# Patient Record
Sex: Female | Born: 1940
Health system: Southern US, Community
[De-identification: ages and names within clinical notes are randomized; demographics above are authoritative.]

## PROBLEM LIST (undated history)

## (undated) DIAGNOSIS — I10 Essential (primary) hypertension: Secondary | ICD-10-CM

## (undated) DIAGNOSIS — S72001A Fracture of unspecified part of neck of right femur, initial encounter for closed fracture: Secondary | ICD-10-CM

## (undated) DIAGNOSIS — M1990 Unspecified osteoarthritis, unspecified site: Secondary | ICD-10-CM

## (undated) DIAGNOSIS — D45 Polycythemia vera: Secondary | ICD-10-CM

## (undated) DIAGNOSIS — D473 Essential (hemorrhagic) thrombocythemia: Secondary | ICD-10-CM

## (undated) DIAGNOSIS — M6282 Rhabdomyolysis: Secondary | ICD-10-CM

## (undated) DIAGNOSIS — H409 Unspecified glaucoma: Secondary | ICD-10-CM

## (undated) DIAGNOSIS — L03119 Cellulitis of unspecified part of limb: Secondary | ICD-10-CM

## (undated) DIAGNOSIS — M87051 Idiopathic aseptic necrosis of right femur: Secondary | ICD-10-CM

## (undated) DIAGNOSIS — T8131XA Disruption of external operation (surgical) wound, not elsewhere classified, initial encounter: Secondary | ICD-10-CM

## (undated) DIAGNOSIS — T148XXA Other injury of unspecified body region, initial encounter: Secondary | ICD-10-CM

## (undated) HISTORY — PX: COLONOSCOPY: SHX174

---

## 1999-06-17 ENCOUNTER — Emergency Department (HOSPITAL_COMMUNITY): Admission: EM | Admit: 1999-06-17 | Discharge: 1999-06-17 | Payer: Self-pay | Admitting: Emergency Medicine

## 1999-06-17 ENCOUNTER — Encounter: Payer: Self-pay | Admitting: Emergency Medicine

## 2002-03-13 ENCOUNTER — Encounter: Payer: Self-pay | Admitting: Cardiology

## 2002-03-13 ENCOUNTER — Ambulatory Visit (HOSPITAL_COMMUNITY): Admission: RE | Admit: 2002-03-13 | Discharge: 2002-03-13 | Payer: Self-pay | Admitting: Cardiology

## 2005-02-28 ENCOUNTER — Ambulatory Visit: Payer: Self-pay | Admitting: Hematology & Oncology

## 2005-04-19 ENCOUNTER — Ambulatory Visit: Payer: Self-pay | Admitting: Hematology & Oncology

## 2005-06-09 ENCOUNTER — Ambulatory Visit: Payer: Self-pay | Admitting: Hematology & Oncology

## 2005-08-09 ENCOUNTER — Ambulatory Visit: Payer: Self-pay | Admitting: Hematology & Oncology

## 2005-10-03 ENCOUNTER — Ambulatory Visit: Payer: Self-pay | Admitting: Hematology & Oncology

## 2005-11-29 ENCOUNTER — Ambulatory Visit: Payer: Self-pay | Admitting: Hematology & Oncology

## 2005-11-30 LAB — CBC WITH DIFFERENTIAL/PLATELET
BASO%: 2.7 % — ABNORMAL HIGH (ref 0.0–2.0)
Eosinophils Absolute: 0.2 10*3/uL (ref 0.0–0.5)
MCV: 75.1 fL — ABNORMAL LOW (ref 81.0–101.0)
MONO%: 8.4 % (ref 0.0–13.0)
NEUT#: 4.9 10*3/uL (ref 1.5–6.5)
RBC: 5.61 10*6/uL — ABNORMAL HIGH (ref 3.70–5.32)
RDW: 25.6 % — ABNORMAL HIGH (ref 11.3–14.5)
WBC: 7 10*3/uL (ref 3.9–10.0)

## 2006-01-23 ENCOUNTER — Ambulatory Visit: Payer: Self-pay | Admitting: Hematology & Oncology

## 2006-01-25 LAB — CBC WITH DIFFERENTIAL/PLATELET
BASO%: 0.8 % (ref 0.0–2.0)
Basophils Absolute: 0.1 10*3/uL (ref 0.0–0.1)
Eosinophils Absolute: 0.2 10*3/uL (ref 0.0–0.5)
HCT: 43.5 % (ref 34.8–46.6)
HGB: 13.6 g/dL (ref 11.6–15.9)
LYMPH%: 19.3 % (ref 14.0–48.0)
MCV: 83.4 fL (ref 81.0–101.0)
MONO#: 0.5 10*3/uL (ref 0.1–0.9)
MONO%: 7 % (ref 0.0–13.0)
NEUT#: 5.2 10*3/uL (ref 1.5–6.5)
Platelets: 454 10*3/uL — ABNORMAL HIGH (ref 145–400)
RBC: 5.22 10*6/uL (ref 3.70–5.32)
WBC: 7.4 10*3/uL (ref 3.9–10.0)

## 2006-04-26 ENCOUNTER — Ambulatory Visit: Payer: Self-pay | Admitting: Hematology & Oncology

## 2006-06-28 ENCOUNTER — Ambulatory Visit: Payer: Self-pay | Admitting: Hematology & Oncology

## 2006-07-02 LAB — CBC WITH DIFFERENTIAL/PLATELET
Basophils Absolute: 0.1 10*3/uL (ref 0.0–0.1)
EOS%: 2.8 % (ref 0.0–7.0)
Eosinophils Absolute: 0.2 10*3/uL (ref 0.0–0.5)
HGB: 14.5 g/dL (ref 11.6–15.9)
LYMPH%: 20.2 % (ref 14.0–48.0)
MCH: 24.3 pg — ABNORMAL LOW (ref 26.0–34.0)
MCV: 78.1 fL — ABNORMAL LOW (ref 81.0–101.0)
MONO%: 5.8 % (ref 0.0–13.0)
NEUT#: 4.3 10*3/uL (ref 1.5–6.5)
Platelets: 395 10*3/uL (ref 145–400)
RDW: 28.2 % — ABNORMAL HIGH (ref 11.3–14.5)

## 2006-07-09 LAB — CBC WITH DIFFERENTIAL/PLATELET
Basophils Absolute: 0.2 10*3/uL — ABNORMAL HIGH (ref 0.0–0.1)
Eosinophils Absolute: 0.2 10*3/uL (ref 0.0–0.5)
HGB: 14.2 g/dL (ref 11.6–15.9)
MCV: 82.4 fL (ref 81.0–101.0)
MONO#: 0.6 10*3/uL (ref 0.1–0.9)
NEUT#: 4.5 10*3/uL (ref 1.5–6.5)
RBC: 5.88 10*6/uL — ABNORMAL HIGH (ref 3.70–5.32)
RDW: 25.5 % — ABNORMAL HIGH (ref 11.3–14.5)
WBC: 6.9 10*3/uL (ref 3.9–10.0)
lymph#: 1.4 10*3/uL (ref 0.9–3.3)

## 2006-07-16 LAB — CBC WITH DIFFERENTIAL/PLATELET
BASO%: 2.9 % — ABNORMAL HIGH (ref 0.0–2.0)
Basophils Absolute: 0.2 10*3/uL — ABNORMAL HIGH (ref 0.0–0.1)
EOS%: 2.7 % (ref 0.0–7.0)
HCT: 43 % (ref 34.8–46.6)
LYMPH%: 19.2 % (ref 14.0–48.0)
MCH: 24.5 pg — ABNORMAL LOW (ref 26.0–34.0)
MCHC: 29.2 g/dL — ABNORMAL LOW (ref 32.0–36.0)
MCV: 83.7 fL (ref 81.0–101.0)
MONO%: 6.5 % (ref 0.0–13.0)
NEUT%: 68.7 % (ref 39.6–76.8)
Platelets: 470 10*3/uL — ABNORMAL HIGH (ref 145–400)
lymph#: 1.3 10*3/uL (ref 0.9–3.3)

## 2006-08-08 ENCOUNTER — Ambulatory Visit: Payer: Self-pay | Admitting: Hematology & Oncology

## 2006-08-13 LAB — COMPREHENSIVE METABOLIC PANEL
AST: 20 U/L (ref 0–37)
Albumin: 3.9 g/dL (ref 3.5–5.2)
Alkaline Phosphatase: 91 U/L (ref 39–117)
Glucose, Bld: 127 mg/dL — ABNORMAL HIGH (ref 70–99)
Potassium: 4.4 mEq/L (ref 3.5–5.3)
Sodium: 144 mEq/L (ref 135–145)
Total Bilirubin: 0.5 mg/dL (ref 0.3–1.2)
Total Protein: 7.5 g/dL (ref 6.0–8.3)

## 2006-08-13 LAB — CBC WITH DIFFERENTIAL/PLATELET
BASO%: 0.5 % (ref 0.0–2.0)
EOS%: 3 % (ref 0.0–7.0)
MCH: 25.6 pg — ABNORMAL LOW (ref 26.0–34.0)
MCHC: 31 g/dL — ABNORMAL LOW (ref 32.0–36.0)
MCV: 82.7 fL (ref 81.0–101.0)
MONO%: 6.9 % (ref 0.0–13.0)
RBC: 5.29 10*6/uL (ref 3.70–5.32)
RDW: 24.9 % — ABNORMAL HIGH (ref 11.3–14.5)
lymph#: 1.2 10*3/uL (ref 0.9–3.3)

## 2006-10-03 ENCOUNTER — Ambulatory Visit: Payer: Self-pay | Admitting: Hematology & Oncology

## 2006-10-08 LAB — CBC WITH DIFFERENTIAL/PLATELET
Eosinophils Absolute: 0.3 10*3/uL (ref 0.0–0.5)
LYMPH%: 16.9 % (ref 14.0–48.0)
MONO#: 0.6 10*3/uL (ref 0.1–0.9)
NEUT#: 7.5 10*3/uL — ABNORMAL HIGH (ref 1.5–6.5)
Platelets: 938 10*3/uL — ABNORMAL HIGH (ref 145–400)
RBC: 5.93 10*6/uL — ABNORMAL HIGH (ref 3.70–5.32)
RDW: 23.3 % — ABNORMAL HIGH (ref 11.3–14.5)
WBC: 10.2 10*3/uL — ABNORMAL HIGH (ref 3.9–10.0)
lymph#: 1.7 10*3/uL (ref 0.9–3.3)

## 2006-11-12 LAB — CBC WITH DIFFERENTIAL/PLATELET
BASO%: 3.4 % — ABNORMAL HIGH (ref 0.0–2.0)
EOS%: 3.2 % (ref 0.0–7.0)
HCT: 47 % — ABNORMAL HIGH (ref 34.8–46.6)
LYMPH%: 23.6 % (ref 14.0–48.0)
MCH: 21.6 pg — ABNORMAL LOW (ref 26.0–34.0)
MCHC: 29.4 g/dL — ABNORMAL LOW (ref 32.0–36.0)
MCV: 73.3 fL — ABNORMAL LOW (ref 81.0–101.0)
MONO%: 8.7 % (ref 0.0–13.0)
NEUT%: 61.1 % (ref 39.6–76.8)
Platelets: 641 10*3/uL — ABNORMAL HIGH (ref 145–400)
RBC: 6.41 10*6/uL — ABNORMAL HIGH (ref 3.70–5.32)

## 2006-11-15 ENCOUNTER — Ambulatory Visit: Payer: Self-pay | Admitting: Hematology & Oncology

## 2006-11-20 LAB — CBC WITH DIFFERENTIAL/PLATELET
BASO%: 2.7 % — ABNORMAL HIGH (ref 0.0–2.0)
Basophils Absolute: 0.2 10*3/uL — ABNORMAL HIGH (ref 0.0–0.1)
EOS%: 2.4 % (ref 0.0–7.0)
HGB: 14.1 g/dL (ref 11.6–15.9)
MCH: 21.5 pg — ABNORMAL LOW (ref 26.0–34.0)
MCHC: 29 g/dL — ABNORMAL LOW (ref 32.0–36.0)
MCV: 74.2 fL — ABNORMAL LOW (ref 81.0–101.0)
MONO%: 7.9 % (ref 0.0–13.0)
RBC: 6.56 10*6/uL — ABNORMAL HIGH (ref 3.70–5.32)
RDW: 24.9 % — ABNORMAL HIGH (ref 11.3–14.5)

## 2006-11-27 LAB — CBC WITH DIFFERENTIAL/PLATELET
EOS%: 3.2 % (ref 0.0–7.0)
Eosinophils Absolute: 0.2 10*3/uL (ref 0.0–0.5)
MCV: 72 fL — ABNORMAL LOW (ref 81.0–101.0)
MONO%: 7.5 % (ref 0.0–13.0)
NEUT#: 4.9 10*3/uL (ref 1.5–6.5)
RBC: 5.38 10*6/uL — ABNORMAL HIGH (ref 3.70–5.32)
RDW: 32.1 % — ABNORMAL HIGH (ref 11.3–14.5)

## 2007-01-09 ENCOUNTER — Ambulatory Visit: Payer: Self-pay | Admitting: Hematology & Oncology

## 2007-01-11 LAB — CBC WITH DIFFERENTIAL/PLATELET
Eosinophils Absolute: 0.2 10*3/uL (ref 0.0–0.5)
MONO#: 0.5 10*3/uL (ref 0.1–0.9)
NEUT#: 5.2 10*3/uL (ref 1.5–6.5)
Platelets: 457 10*3/uL — ABNORMAL HIGH (ref 145–400)
RBC: 5.14 10*6/uL (ref 3.70–5.32)
RDW: 28.6 % — ABNORMAL HIGH (ref 11.3–14.5)
WBC: 7.5 10*3/uL (ref 3.9–10.0)

## 2007-01-11 LAB — CHCC SMEAR

## 2007-04-18 ENCOUNTER — Ambulatory Visit: Payer: Self-pay | Admitting: Hematology & Oncology

## 2007-05-13 LAB — CBC WITH DIFFERENTIAL/PLATELET
BASO%: 2.2 % — ABNORMAL HIGH (ref 0.0–2.0)
Basophils Absolute: 0.1 10*3/uL (ref 0.0–0.1)
EOS%: 2.3 % (ref 0.0–7.0)
Eosinophils Absolute: 0.2 10*3/uL (ref 0.0–0.5)
HCT: 43.5 % (ref 34.8–46.6)
HGB: 14.3 g/dL (ref 11.6–15.9)
LYMPH%: 21 % (ref 14.0–48.0)
MCH: 30.1 pg (ref 26.0–34.0)
MCHC: 32.8 g/dL (ref 32.0–36.0)
MCV: 91.8 fL (ref 81.0–101.0)
MONO#: 0.5 10*3/uL (ref 0.1–0.9)
MONO%: 7.7 % (ref 0.0–13.0)
NEUT#: 4.5 10*3/uL (ref 1.5–6.5)
NEUT%: 66.8 % (ref 39.6–76.8)
Platelets: 436 10*3/uL — ABNORMAL HIGH (ref 145–400)
RBC: 4.74 10*6/uL (ref 3.70–5.32)
RDW: 18.7 % — ABNORMAL HIGH (ref 11.3–14.5)
WBC: 6.7 10*3/uL (ref 3.9–10.0)
lymph#: 1.4 10*3/uL (ref 0.9–3.3)

## 2007-05-13 LAB — FERRITIN: Ferritin: 31 ng/mL (ref 10–291)

## 2007-05-13 LAB — CHCC SMEAR

## 2007-08-28 ENCOUNTER — Ambulatory Visit: Payer: Self-pay | Admitting: Hematology & Oncology

## 2007-09-02 LAB — CBC WITH DIFFERENTIAL/PLATELET
Basophils Absolute: 0.1 10*3/uL (ref 0.0–0.1)
EOS%: 2.1 % (ref 0.0–7.0)
HCT: 46.6 % (ref 34.8–46.6)
HGB: 14.7 g/dL (ref 11.6–15.9)
MCH: 27.1 pg (ref 26.0–34.0)
MCV: 85.6 fL (ref 81.0–101.0)
MONO%: 7.1 % (ref 0.0–13.0)
NEUT%: 71.3 % (ref 39.6–76.8)
Platelets: 407 10*3/uL — ABNORMAL HIGH (ref 145–400)

## 2007-10-30 ENCOUNTER — Ambulatory Visit: Payer: Self-pay | Admitting: Hematology & Oncology

## 2007-11-01 LAB — CBC WITH DIFFERENTIAL/PLATELET
BASO%: 0 % (ref 0.0–2.0)
EOS%: 2.2 % (ref 0.0–7.0)
LYMPH%: 16.9 % (ref 14.0–48.0)
MCH: 28.5 pg (ref 26.0–34.0)
MCHC: 32.3 g/dL (ref 32.0–36.0)
MONO#: 0.5 10*3/uL (ref 0.1–0.9)
RBC: 5.18 10*6/uL (ref 3.70–5.32)
WBC: 7.4 10*3/uL (ref 3.9–10.0)
lymph#: 1.2 10*3/uL (ref 0.9–3.3)

## 2007-11-01 LAB — CHCC SMEAR

## 2007-12-31 ENCOUNTER — Ambulatory Visit: Payer: Self-pay | Admitting: Hematology & Oncology

## 2008-02-10 ENCOUNTER — Ambulatory Visit: Payer: Self-pay | Admitting: Hematology & Oncology

## 2008-02-12 LAB — CBC WITH DIFFERENTIAL/PLATELET
BASO%: 0.7 % (ref 0.0–2.0)
LYMPH%: 15.7 % (ref 14.0–48.0)
MCH: 30.4 pg (ref 26.0–34.0)
MCHC: 32.5 g/dL (ref 32.0–36.0)
MCV: 93.5 fL (ref 81.0–101.0)
MONO%: 8.9 % (ref 0.0–13.0)
Platelets: 402 10*3/uL — ABNORMAL HIGH (ref 145–400)
RBC: 4.53 10*6/uL (ref 3.70–5.32)

## 2008-06-11 ENCOUNTER — Ambulatory Visit: Payer: Self-pay | Admitting: Hematology & Oncology

## 2008-06-15 LAB — CBC WITH DIFFERENTIAL/PLATELET
BASO%: 1.1 % (ref 0.0–2.0)
EOS%: 2.1 % (ref 0.0–7.0)
HCT: 39.7 % (ref 34.8–46.6)
LYMPH%: 17.6 % (ref 14.0–48.0)
MCH: 32.5 pg (ref 26.0–34.0)
MCHC: 32.4 g/dL (ref 32.0–36.0)
MONO%: 5.9 % (ref 0.0–13.0)
NEUT%: 73.3 % (ref 39.6–76.8)
Platelets: 451 10*3/uL — ABNORMAL HIGH (ref 145–400)
lymph#: 1.3 10*3/uL (ref 0.9–3.3)

## 2008-09-11 ENCOUNTER — Ambulatory Visit: Payer: Self-pay | Admitting: Hematology & Oncology

## 2008-09-25 LAB — COMPREHENSIVE METABOLIC PANEL
ALT: 17 U/L (ref 0–35)
CO2: 30 mEq/L (ref 19–32)
Sodium: 141 mEq/L (ref 135–145)
Total Bilirubin: 0.9 mg/dL (ref 0.3–1.2)
Total Protein: 7.8 g/dL (ref 6.0–8.3)

## 2008-09-25 LAB — CBC WITH DIFFERENTIAL/PLATELET
BASO%: 1.4 % (ref 0.0–2.0)
LYMPH%: 14.6 % (ref 14.0–48.0)
MCHC: 31.9 g/dL — ABNORMAL LOW (ref 32.0–36.0)
MONO#: 0.5 10*3/uL (ref 0.1–0.9)
Platelets: 454 10*3/uL — ABNORMAL HIGH (ref 145–400)
RBC: 4.01 10*6/uL (ref 3.70–5.32)
WBC: 8 10*3/uL (ref 3.9–10.0)
lymph#: 1.2 10*3/uL (ref 0.9–3.3)

## 2008-12-11 ENCOUNTER — Ambulatory Visit: Payer: Self-pay | Admitting: Oncology

## 2008-12-29 LAB — CBC WITH DIFFERENTIAL/PLATELET
BASO%: 0.7 % (ref 0.0–2.0)
EOS%: 2.4 % (ref 0.0–7.0)
LYMPH%: 19.9 % (ref 14.0–49.7)
MCHC: 32 g/dL (ref 31.5–36.0)
MCV: 94 fL (ref 79.5–101.0)
MONO%: 7.5 % (ref 0.0–14.0)
Platelets: 302 10*3/uL (ref 145–400)
RBC: 4.73 10*6/uL (ref 3.70–5.45)

## 2009-01-12 LAB — CBC WITH DIFFERENTIAL/PLATELET
Basophils Absolute: 0 10*3/uL (ref 0.0–0.1)
EOS%: 1.4 % (ref 0.0–7.0)
Eosinophils Absolute: 0.1 10*3/uL (ref 0.0–0.5)
HCT: 44.7 % (ref 34.8–46.6)
HGB: 14.3 g/dL (ref 11.6–15.9)
MCH: 30 pg (ref 25.1–34.0)
MONO#: 0.5 10*3/uL (ref 0.1–0.9)
NEUT#: 5.3 10*3/uL (ref 1.5–6.5)
NEUT%: 73.7 % (ref 38.4–76.8)
RDW: 15.6 % — ABNORMAL HIGH (ref 11.2–14.5)
WBC: 7.3 10*3/uL (ref 3.9–10.3)
lymph#: 1.3 10*3/uL (ref 0.9–3.3)

## 2009-01-21 ENCOUNTER — Ambulatory Visit: Payer: Self-pay | Admitting: Oncology

## 2009-01-25 LAB — CBC WITH DIFFERENTIAL/PLATELET
Basophils Absolute: 0 10*3/uL (ref 0.0–0.1)
Eosinophils Absolute: 0.2 10*3/uL (ref 0.0–0.5)
HCT: 45.9 % (ref 34.8–46.6)
HGB: 15 g/dL (ref 11.6–15.9)
MCV: 93.1 fL (ref 79.5–101.0)
MONO%: 5.5 % (ref 0.0–14.0)
NEUT#: 6.8 10*3/uL — ABNORMAL HIGH (ref 1.5–6.5)
NEUT%: 75.8 % (ref 38.4–76.8)
RDW: 16.5 % — ABNORMAL HIGH (ref 11.2–14.5)

## 2009-02-09 LAB — CBC WITH DIFFERENTIAL/PLATELET
Basophils Absolute: 0.1 10*3/uL (ref 0.0–0.1)
Eosinophils Absolute: 0.2 10*3/uL (ref 0.0–0.5)
HGB: 14 g/dL (ref 11.6–15.9)
LYMPH%: 19.9 % (ref 14.0–49.7)
MCH: 29.8 pg (ref 25.1–34.0)
MCV: 92.9 fL (ref 79.5–101.0)
MONO%: 4.4 % (ref 0.0–14.0)
NEUT#: 4.6 10*3/uL (ref 1.5–6.5)
Platelets: 264 10*3/uL (ref 145–400)
RBC: 4.71 10*6/uL (ref 3.70–5.45)

## 2009-02-22 ENCOUNTER — Ambulatory Visit: Payer: Self-pay | Admitting: Oncology

## 2009-03-08 LAB — CBC WITH DIFFERENTIAL/PLATELET
BASO%: 1.7 % (ref 0.0–2.0)
EOS%: 1.9 % (ref 0.0–7.0)
MCH: 29.2 pg (ref 25.1–34.0)
MCV: 90.9 fL (ref 79.5–101.0)
MONO%: 6.2 % (ref 0.0–14.0)
RBC: 4.18 10*6/uL (ref 3.70–5.45)
RDW: 17 % — ABNORMAL HIGH (ref 11.2–14.5)

## 2009-04-02 ENCOUNTER — Ambulatory Visit: Payer: Self-pay | Admitting: Oncology

## 2009-04-06 LAB — CBC WITH DIFFERENTIAL/PLATELET
Basophils Absolute: 0.1 10*3/uL (ref 0.0–0.1)
EOS%: 1.8 % (ref 0.0–7.0)
HCT: 35.5 % (ref 34.8–46.6)
HGB: 11.4 g/dL — ABNORMAL LOW (ref 11.6–15.9)
MCH: 31.2 pg (ref 25.1–34.0)
MCHC: 32 g/dL (ref 31.5–36.0)
MCV: 97.7 fL (ref 79.5–101.0)
MONO%: 5.8 % (ref 0.0–14.0)
NEUT%: 76.3 % (ref 38.4–76.8)
RDW: 20.4 % — ABNORMAL HIGH (ref 11.2–14.5)

## 2009-05-04 ENCOUNTER — Ambulatory Visit: Payer: Self-pay | Admitting: Oncology

## 2009-05-04 LAB — CBC WITH DIFFERENTIAL/PLATELET
BASO%: 0.5 % (ref 0.0–2.0)
Basophils Absolute: 0 10*3/uL (ref 0.0–0.1)
EOS%: 2.1 % (ref 0.0–7.0)
MCH: 32.5 pg (ref 25.1–34.0)
MCHC: 32.6 g/dL (ref 31.5–36.0)
MCV: 99.7 fL (ref 79.5–101.0)
MONO%: 6.6 % (ref 0.0–14.0)
RBC: 3.74 10*6/uL (ref 3.70–5.45)
RDW: 18.5 % — ABNORMAL HIGH (ref 11.2–14.5)
lymph#: 1.1 10*3/uL (ref 0.9–3.3)

## 2009-05-04 LAB — COMPREHENSIVE METABOLIC PANEL
ALT: 20 U/L (ref 0–35)
AST: 23 U/L (ref 0–37)
Albumin: 4.2 g/dL (ref 3.5–5.2)
Alkaline Phosphatase: 79 U/L (ref 39–117)
BUN: 20 mg/dL (ref 6–23)
Potassium: 4 mEq/L (ref 3.5–5.3)

## 2009-06-01 LAB — CBC WITH DIFFERENTIAL/PLATELET
BASO%: 0.6 % (ref 0.0–2.0)
Basophils Absolute: 0 10*3/uL (ref 0.0–0.1)
EOS%: 1.6 % (ref 0.0–7.0)
HCT: 35.2 % (ref 34.8–46.6)
HGB: 11.5 g/dL — ABNORMAL LOW (ref 11.6–15.9)
LYMPH%: 15.8 % (ref 14.0–49.7)
MCH: 33.2 pg (ref 25.1–34.0)
MCHC: 32.6 g/dL (ref 31.5–36.0)
MCV: 101.8 fL — ABNORMAL HIGH (ref 79.5–101.0)
MONO%: 6.8 % (ref 0.0–14.0)
NEUT%: 75.2 % (ref 38.4–76.8)

## 2009-06-24 ENCOUNTER — Ambulatory Visit: Payer: Self-pay | Admitting: Oncology

## 2009-06-28 LAB — CBC WITH DIFFERENTIAL/PLATELET
EOS%: 1.4 % (ref 0.0–7.0)
Eosinophils Absolute: 0.1 10*3/uL (ref 0.0–0.5)
LYMPH%: 17.8 % (ref 14.0–49.7)
MCH: 33.2 pg (ref 25.1–34.0)
MCV: 103.2 fL — ABNORMAL HIGH (ref 79.5–101.0)
MONO%: 6.4 % (ref 0.0–14.0)
NEUT#: 4.4 10*3/uL (ref 1.5–6.5)
Platelets: 375 10*3/uL (ref 145–400)
RBC: 3.47 10*6/uL — ABNORMAL LOW (ref 3.70–5.45)

## 2009-06-28 LAB — COMPREHENSIVE METABOLIC PANEL
AST: 20 U/L (ref 0–37)
Alkaline Phosphatase: 72 U/L (ref 39–117)
BUN: 19 mg/dL (ref 6–23)
Glucose, Bld: 83 mg/dL (ref 70–99)
Sodium: 142 mEq/L (ref 135–145)
Total Bilirubin: 0.5 mg/dL (ref 0.3–1.2)

## 2009-08-24 ENCOUNTER — Ambulatory Visit: Payer: Self-pay | Admitting: Oncology

## 2009-08-31 LAB — CBC WITH DIFFERENTIAL/PLATELET
BASO%: 0.9 % (ref 0.0–2.0)
Basophils Absolute: 0.1 10*3/uL (ref 0.0–0.1)
EOS%: 1.6 % (ref 0.0–7.0)
Eosinophils Absolute: 0.1 10*3/uL (ref 0.0–0.5)
HCT: 35.5 % (ref 34.8–46.6)
HGB: 11.5 g/dL — ABNORMAL LOW (ref 11.6–15.9)
LYMPH%: 21.8 % (ref 14.0–49.7)
MCH: 33.1 pg (ref 25.1–34.0)
MCHC: 32.4 g/dL (ref 31.5–36.0)
MCV: 102.1 fL — ABNORMAL HIGH (ref 79.5–101.0)
MONO#: 0.4 10*3/uL (ref 0.1–0.9)
MONO%: 8.1 % (ref 0.0–14.0)
NEUT#: 3.7 10*3/uL (ref 1.5–6.5)
NEUT%: 67.6 % (ref 38.4–76.8)
Platelets: 480 10*3/uL — ABNORMAL HIGH (ref 145–400)
RBC: 3.48 10*6/uL — ABNORMAL LOW (ref 3.70–5.45)
RDW: 15.1 % — ABNORMAL HIGH (ref 11.2–14.5)
WBC: 5.5 10*3/uL (ref 3.9–10.3)
lymph#: 1.2 10*3/uL (ref 0.9–3.3)

## 2009-10-15 ENCOUNTER — Ambulatory Visit: Payer: Self-pay | Admitting: Oncology

## 2009-10-26 LAB — CBC WITH DIFFERENTIAL/PLATELET
Basophils Absolute: 0 10*3/uL (ref 0.0–0.1)
Eosinophils Absolute: 0.1 10*3/uL (ref 0.0–0.5)
HCT: 36.7 % (ref 34.8–46.6)
HGB: 11.9 g/dL (ref 11.6–15.9)
LYMPH%: 16.7 % (ref 14.0–49.7)
MCV: 100.1 fL (ref 79.5–101.0)
MONO%: 7.5 % (ref 0.0–14.0)
NEUT#: 4.5 10*3/uL (ref 1.5–6.5)
Platelets: 476 10*3/uL — ABNORMAL HIGH (ref 145–400)

## 2009-11-08 ENCOUNTER — Emergency Department (HOSPITAL_COMMUNITY): Admission: EM | Admit: 2009-11-08 | Discharge: 2009-11-08 | Payer: Self-pay | Admitting: Family Medicine

## 2009-12-20 ENCOUNTER — Ambulatory Visit: Payer: Self-pay | Admitting: Oncology

## 2009-12-21 LAB — CBC WITH DIFFERENTIAL/PLATELET
Basophils Absolute: 0 10*3/uL (ref 0.0–0.1)
Eosinophils Absolute: 0.2 10*3/uL (ref 0.0–0.5)
HGB: 12.9 g/dL (ref 11.6–15.9)
MCV: 97.1 fL (ref 79.5–101.0)
NEUT#: 7.4 10*3/uL — ABNORMAL HIGH (ref 1.5–6.5)
RDW: 15.1 % — ABNORMAL HIGH (ref 11.2–14.5)
lymph#: 1.3 10*3/uL (ref 0.9–3.3)

## 2010-02-10 ENCOUNTER — Ambulatory Visit: Payer: Self-pay | Admitting: Oncology

## 2010-03-01 LAB — CBC WITH DIFFERENTIAL/PLATELET
Basophils Absolute: 0.1 10*3/uL (ref 0.0–0.1)
Eosinophils Absolute: 0.1 10*3/uL (ref 0.0–0.5)
HCT: 47.2 % — ABNORMAL HIGH (ref 34.8–46.6)
HGB: 14.9 g/dL (ref 11.6–15.9)
LYMPH%: 17.7 % (ref 14.0–49.7)
MCV: 90 fL (ref 79.5–101.0)
MONO%: 6.2 % (ref 0.0–14.0)
NEUT#: 5.3 10*3/uL (ref 1.5–6.5)
NEUT%: 73.5 % (ref 38.4–76.8)
Platelets: 447 10*3/uL — ABNORMAL HIGH (ref 145–400)
RDW: 16.8 % — ABNORMAL HIGH (ref 11.2–14.5)

## 2010-03-01 LAB — COMPREHENSIVE METABOLIC PANEL
Albumin: 3.9 g/dL (ref 3.5–5.2)
Alkaline Phosphatase: 86 U/L (ref 39–117)
BUN: 16 mg/dL (ref 6–23)
Calcium: 9.5 mg/dL (ref 8.4–10.5)
Glucose, Bld: 81 mg/dL (ref 70–99)
Potassium: 3.7 mEq/L (ref 3.5–5.3)

## 2010-03-25 ENCOUNTER — Ambulatory Visit: Payer: Self-pay | Admitting: Oncology

## 2010-03-29 LAB — CBC WITH DIFFERENTIAL/PLATELET
Basophils Absolute: 0.1 10*3/uL (ref 0.0–0.1)
Eosinophils Absolute: 0.1 10*3/uL (ref 0.0–0.5)
HGB: 13.7 g/dL (ref 11.6–15.9)
MONO#: 0.5 10*3/uL (ref 0.1–0.9)
NEUT#: 5 10*3/uL (ref 1.5–6.5)
RDW: 20.4 % — ABNORMAL HIGH (ref 11.2–14.5)
lymph#: 1 10*3/uL (ref 0.9–3.3)

## 2010-04-26 ENCOUNTER — Ambulatory Visit: Payer: Self-pay | Admitting: Oncology

## 2010-04-26 LAB — CBC WITH DIFFERENTIAL/PLATELET
BASO%: 1.3 % (ref 0.0–2.0)
Basophils Absolute: 0.1 10*3/uL (ref 0.0–0.1)
EOS%: 1.5 % (ref 0.0–7.0)
Eosinophils Absolute: 0.1 10*3/uL (ref 0.0–0.5)
HCT: 41.1 % (ref 34.8–46.6)
HGB: 13.1 g/dL (ref 11.6–15.9)
LYMPH%: 11.1 % — ABNORMAL LOW (ref 14.0–49.7)
MCH: 29.6 pg (ref 25.1–34.0)
MCHC: 32 g/dL (ref 31.5–36.0)
MCV: 92.7 fL (ref 79.5–101.0)
MONO#: 0.5 10*3/uL (ref 0.1–0.9)
MONO%: 5.1 % (ref 0.0–14.0)
NEUT#: 7.3 10*3/uL — ABNORMAL HIGH (ref 1.5–6.5)
NEUT%: 81 % — ABNORMAL HIGH (ref 38.4–76.8)
Platelets: 508 10*3/uL — ABNORMAL HIGH (ref 145–400)
RBC: 4.44 10*6/uL (ref 3.70–5.45)
RDW: 22.8 % — ABNORMAL HIGH (ref 11.2–14.5)
WBC: 9 10*3/uL (ref 3.9–10.3)
lymph#: 1 10*3/uL (ref 0.9–3.3)

## 2010-06-03 ENCOUNTER — Ambulatory Visit: Payer: Self-pay | Admitting: Oncology

## 2010-07-18 ENCOUNTER — Ambulatory Visit: Payer: Self-pay | Admitting: Oncology

## 2010-07-19 LAB — COMPREHENSIVE METABOLIC PANEL
Alkaline Phosphatase: 80 U/L (ref 39–117)
Creatinine, Ser: 0.89 mg/dL (ref 0.40–1.20)
Glucose, Bld: 78 mg/dL (ref 70–99)
Sodium: 140 mEq/L (ref 135–145)
Total Bilirubin: 0.6 mg/dL (ref 0.3–1.2)
Total Protein: 7 g/dL (ref 6.0–8.3)

## 2010-07-19 LAB — CBC WITH DIFFERENTIAL/PLATELET
BASO%: 1.1 % (ref 0.0–2.0)
Eosinophils Absolute: 0.1 10*3/uL (ref 0.0–0.5)
LYMPH%: 17.3 % (ref 14.0–49.7)
MCHC: 32.5 g/dL (ref 31.5–36.0)
MCV: 103.6 fL — ABNORMAL HIGH (ref 79.5–101.0)
MONO%: 7.1 % (ref 0.0–14.0)
NEUT%: 72.9 % (ref 38.4–76.8)
Platelets: 331 10*3/uL (ref 145–400)
RBC: 2.83 10*6/uL — ABNORMAL LOW (ref 3.70–5.45)

## 2010-07-26 LAB — CBC WITH DIFFERENTIAL/PLATELET
Basophils Absolute: 0.1 10*3/uL (ref 0.0–0.1)
Eosinophils Absolute: 0.1 10*3/uL (ref 0.0–0.5)
HCT: 30.8 % — ABNORMAL LOW (ref 34.8–46.6)
HGB: 10.3 g/dL — ABNORMAL LOW (ref 11.6–15.9)
LYMPH%: 15.6 % (ref 14.0–49.7)
MCV: 104 fL — ABNORMAL HIGH (ref 79.5–101.0)
MONO#: 0.5 10*3/uL (ref 0.1–0.9)
MONO%: 7.2 % (ref 0.0–14.0)
NEUT#: 4.7 10*3/uL (ref 1.5–6.5)
NEUT%: 74.8 % (ref 38.4–76.8)
Platelets: 375 10*3/uL (ref 145–400)
RBC: 2.96 10*6/uL — ABNORMAL LOW (ref 3.70–5.45)

## 2010-09-02 ENCOUNTER — Ambulatory Visit: Payer: Self-pay | Admitting: Oncology

## 2010-09-06 LAB — CBC WITH DIFFERENTIAL/PLATELET
BASO%: 1.4 % (ref 0.0–2.0)
Basophils Absolute: 0.2 10*3/uL — ABNORMAL HIGH (ref 0.0–0.1)
EOS%: 5.8 % (ref 0.0–7.0)
Eosinophils Absolute: 0.7 10*3/uL — ABNORMAL HIGH (ref 0.0–0.5)
HCT: 42.8 % (ref 34.8–46.6)
HGB: 13.7 g/dL (ref 11.6–15.9)
LYMPH%: 16.1 % (ref 14.0–49.7)
MCH: 29.6 pg (ref 25.1–34.0)
MCHC: 32 g/dL (ref 31.5–36.0)
MCV: 92.4 fL (ref 79.5–101.0)
MONO#: 0.6 10*3/uL (ref 0.1–0.9)
MONO%: 5.4 % (ref 0.0–14.0)
NEUT#: 8.3 10*3/uL — ABNORMAL HIGH (ref 1.5–6.5)
NEUT%: 71.3 % (ref 38.4–76.8)
Platelets: 969 10*3/uL — ABNORMAL HIGH (ref 145–400)
RBC: 4.63 10*6/uL (ref 3.70–5.45)
RDW: 17.6 % — ABNORMAL HIGH (ref 11.2–14.5)
WBC: 11.7 10*3/uL — ABNORMAL HIGH (ref 3.9–10.3)
lymph#: 1.9 10*3/uL (ref 0.9–3.3)
nRBC: 0 % (ref 0–0)

## 2010-09-06 LAB — TECHNOLOGIST REVIEW

## 2010-09-27 ENCOUNTER — Encounter (HOSPITAL_BASED_OUTPATIENT_CLINIC_OR_DEPARTMENT_OTHER): Payer: Medicare PPO | Admitting: Oncology

## 2010-09-27 ENCOUNTER — Other Ambulatory Visit: Payer: Self-pay | Admitting: Oncology

## 2010-09-27 DIAGNOSIS — D45 Polycythemia vera: Secondary | ICD-10-CM

## 2010-09-27 LAB — CBC WITH DIFFERENTIAL/PLATELET
BASO%: 0.6 % (ref 0.0–2.0)
Basophils Absolute: 0 10*3/uL (ref 0.0–0.1)
EOS%: 4.5 % (ref 0.0–7.0)
Eosinophils Absolute: 0.3 10*3/uL (ref 0.0–0.5)
HCT: 46.6 % (ref 34.8–46.6)
HGB: 14.1 g/dL (ref 11.6–15.9)
LYMPH%: 16 % (ref 14.0–49.7)
MCH: 26.4 pg (ref 25.1–34.0)
MCHC: 30.3 g/dL — ABNORMAL LOW (ref 31.5–36.0)
MCV: 87 fL (ref 79.5–101.0)
MONO#: 0.5 10*3/uL (ref 0.1–0.9)
MONO%: 6.5 % (ref 0.0–14.0)
NEUT#: 5.4 10*3/uL (ref 1.5–6.5)
NEUT%: 72.4 % (ref 38.4–76.8)
Platelets: 466 10*3/uL — ABNORMAL HIGH (ref 145–400)
RBC: 5.36 10*6/uL (ref 3.70–5.45)
RDW: 20.6 % — ABNORMAL HIGH (ref 11.2–14.5)
WBC: 7.4 10*3/uL (ref 3.9–10.3)
lymph#: 1.2 10*3/uL (ref 0.9–3.3)

## 2011-01-05 ENCOUNTER — Encounter (HOSPITAL_BASED_OUTPATIENT_CLINIC_OR_DEPARTMENT_OTHER): Payer: Medicare PPO | Admitting: Oncology

## 2011-01-05 ENCOUNTER — Other Ambulatory Visit: Payer: Self-pay | Admitting: Oncology

## 2011-01-05 DIAGNOSIS — D45 Polycythemia vera: Secondary | ICD-10-CM

## 2011-01-05 LAB — CBC WITH DIFFERENTIAL/PLATELET
BASO%: 2.2 % — ABNORMAL HIGH (ref 0.0–2.0)
HCT: 49.1 % — ABNORMAL HIGH (ref 34.8–46.6)
LYMPH%: 15.4 % (ref 14.0–49.7)
MCH: 21.4 pg — ABNORMAL LOW (ref 25.1–34.0)
MCHC: 30.1 g/dL — ABNORMAL LOW (ref 31.5–36.0)
MCV: 71.2 fL — ABNORMAL LOW (ref 79.5–101.0)
MONO%: 6.3 % (ref 0.0–14.0)
NEUT%: 72.3 % (ref 38.4–76.8)
Platelets: 743 10*3/uL — ABNORMAL HIGH (ref 145–400)
RBC: 6.9 10*6/uL — ABNORMAL HIGH (ref 3.70–5.45)
nRBC: 0 % (ref 0–0)

## 2011-01-26 ENCOUNTER — Other Ambulatory Visit: Payer: Self-pay | Admitting: Oncology

## 2011-01-26 ENCOUNTER — Encounter (HOSPITAL_BASED_OUTPATIENT_CLINIC_OR_DEPARTMENT_OTHER): Payer: Medicare PPO | Admitting: Oncology

## 2011-01-26 DIAGNOSIS — D45 Polycythemia vera: Secondary | ICD-10-CM

## 2011-01-26 LAB — CBC WITH DIFFERENTIAL/PLATELET
BASO%: 0.2 % (ref 0.0–2.0)
EOS%: 2.8 % (ref 0.0–7.0)
LYMPH%: 15.7 % (ref 14.0–49.7)
MCH: 22.8 pg — ABNORMAL LOW (ref 25.1–34.0)
MCHC: 30 g/dL — ABNORMAL LOW (ref 31.5–36.0)
MONO#: 0.6 10*3/uL (ref 0.1–0.9)
RBC: 6.34 10*6/uL — ABNORMAL HIGH (ref 3.70–5.45)
WBC: 7.8 10*3/uL (ref 3.9–10.3)
lymph#: 1.2 10*3/uL (ref 0.9–3.3)

## 2011-02-13 ENCOUNTER — Encounter (HOSPITAL_BASED_OUTPATIENT_CLINIC_OR_DEPARTMENT_OTHER): Payer: Medicare PPO | Admitting: Oncology

## 2011-02-13 ENCOUNTER — Other Ambulatory Visit: Payer: Self-pay | Admitting: Oncology

## 2011-02-13 DIAGNOSIS — D45 Polycythemia vera: Secondary | ICD-10-CM

## 2011-02-13 LAB — CBC WITH DIFFERENTIAL/PLATELET
BASO%: 0.8 % (ref 0.0–2.0)
EOS%: 4.5 % (ref 0.0–7.0)
HCT: 49.2 % — ABNORMAL HIGH (ref 34.8–46.6)
HGB: 14.9 g/dL (ref 11.6–15.9)
MCH: 24.1 pg — ABNORMAL LOW (ref 25.1–34.0)
MCHC: 30.3 g/dL — ABNORMAL LOW (ref 31.5–36.0)
MONO#: 0.4 10*3/uL (ref 0.1–0.9)
NEUT%: 77 % — ABNORMAL HIGH (ref 38.4–76.8)
RDW: 26 % — ABNORMAL HIGH (ref 11.2–14.5)
WBC: 7.4 10*3/uL (ref 3.9–10.3)
lymph#: 0.9 10*3/uL (ref 0.9–3.3)

## 2011-03-07 ENCOUNTER — Other Ambulatory Visit: Payer: Self-pay | Admitting: Oncology

## 2011-03-07 ENCOUNTER — Encounter (HOSPITAL_BASED_OUTPATIENT_CLINIC_OR_DEPARTMENT_OTHER): Payer: Medicare PPO | Admitting: Oncology

## 2011-03-07 DIAGNOSIS — D45 Polycythemia vera: Secondary | ICD-10-CM

## 2011-03-07 LAB — CBC WITH DIFFERENTIAL/PLATELET
Basophils Absolute: 0.1 10*3/uL (ref 0.0–0.1)
Eosinophils Absolute: 0.3 10*3/uL (ref 0.0–0.5)
HCT: 48.8 % — ABNORMAL HIGH (ref 34.8–46.6)
HGB: 15.2 g/dL (ref 11.6–15.9)
MCH: 24.6 pg — ABNORMAL LOW (ref 25.1–34.0)
MONO#: 0.5 10*3/uL (ref 0.1–0.9)
NEUT#: 4.9 10*3/uL (ref 1.5–6.5)
NEUT%: 70.1 % (ref 38.4–76.8)
RDW: 21.9 % — ABNORMAL HIGH (ref 11.2–14.5)
lymph#: 1.3 10*3/uL (ref 0.9–3.3)

## 2011-03-30 ENCOUNTER — Encounter (HOSPITAL_BASED_OUTPATIENT_CLINIC_OR_DEPARTMENT_OTHER): Payer: Medicare PPO | Admitting: Oncology

## 2011-03-30 ENCOUNTER — Other Ambulatory Visit: Payer: Self-pay | Admitting: Oncology

## 2011-03-30 DIAGNOSIS — D45 Polycythemia vera: Secondary | ICD-10-CM

## 2011-03-30 LAB — CBC WITH DIFFERENTIAL/PLATELET
Basophils Absolute: 0 10*3/uL (ref 0.0–0.1)
EOS%: 2.4 % (ref 0.0–7.0)
Eosinophils Absolute: 0.1 10*3/uL (ref 0.0–0.5)
HCT: 45.6 % (ref 34.8–46.6)
HGB: 14.5 g/dL (ref 11.6–15.9)
LYMPH%: 13.8 % — ABNORMAL LOW (ref 14.0–49.7)
MCH: 26.6 pg (ref 25.1–34.0)
MCV: 83.8 fL (ref 79.5–101.0)
MONO%: 7.5 % (ref 0.0–14.0)
NEUT#: 4.5 10*3/uL (ref 1.5–6.5)
NEUT%: 75.7 % (ref 38.4–76.8)
Platelets: 459 10*3/uL — ABNORMAL HIGH (ref 145–400)
RDW: 23.4 % — ABNORMAL HIGH (ref 11.2–14.5)

## 2011-04-18 ENCOUNTER — Encounter (HOSPITAL_BASED_OUTPATIENT_CLINIC_OR_DEPARTMENT_OTHER): Payer: Medicare PPO | Admitting: Oncology

## 2011-04-18 ENCOUNTER — Other Ambulatory Visit: Payer: Self-pay | Admitting: Oncology

## 2011-04-18 DIAGNOSIS — D45 Polycythemia vera: Secondary | ICD-10-CM

## 2011-04-18 LAB — CBC WITH DIFFERENTIAL/PLATELET
BASO%: 0.6 % (ref 0.0–2.0)
EOS%: 2.2 % (ref 0.0–7.0)
HGB: 13.6 g/dL (ref 11.6–15.9)
MCH: 27.2 pg (ref 25.1–34.0)
MCHC: 31.6 g/dL (ref 31.5–36.0)
MONO%: 4.7 % (ref 0.0–14.0)
RBC: 5.01 10*6/uL (ref 3.70–5.45)
RDW: 22.5 % — ABNORMAL HIGH (ref 11.2–14.5)
lymph#: 0.8 10*3/uL — ABNORMAL LOW (ref 0.9–3.3)

## 2011-05-23 ENCOUNTER — Other Ambulatory Visit: Payer: Self-pay | Admitting: Oncology

## 2011-05-23 ENCOUNTER — Encounter (HOSPITAL_BASED_OUTPATIENT_CLINIC_OR_DEPARTMENT_OTHER): Payer: Medicare PPO | Admitting: Oncology

## 2011-05-23 DIAGNOSIS — D45 Polycythemia vera: Secondary | ICD-10-CM

## 2011-05-23 LAB — CBC WITH DIFFERENTIAL/PLATELET
Basophils Absolute: 0.1 10*3/uL (ref 0.0–0.1)
Eosinophils Absolute: 0.2 10*3/uL (ref 0.0–0.5)
HGB: 11.8 g/dL (ref 11.6–15.9)
NEUT#: 4.9 10*3/uL (ref 1.5–6.5)
RBC: 4.15 10*6/uL (ref 3.70–5.45)
RDW: 21.2 % — ABNORMAL HIGH (ref 11.2–14.5)
WBC: 6.7 10*3/uL (ref 3.9–10.3)
lymph#: 1.1 10*3/uL (ref 0.9–3.3)
nRBC: 0 % (ref 0–0)

## 2011-06-20 ENCOUNTER — Other Ambulatory Visit (HOSPITAL_BASED_OUTPATIENT_CLINIC_OR_DEPARTMENT_OTHER): Payer: Medicare PPO | Admitting: Lab

## 2011-06-20 ENCOUNTER — Other Ambulatory Visit: Payer: Self-pay | Admitting: Oncology

## 2011-06-20 DIAGNOSIS — D45 Polycythemia vera: Secondary | ICD-10-CM

## 2011-06-20 LAB — CBC WITH DIFFERENTIAL/PLATELET
BASO%: 0.1 % (ref 0.0–2.0)
Eosinophils Absolute: 0.2 10*3/uL (ref 0.0–0.5)
HCT: 37.7 % (ref 34.8–46.6)
LYMPH%: 11.8 % — ABNORMAL LOW (ref 14.0–49.7)
MCHC: 31.9 g/dL (ref 31.5–36.0)
MONO#: 0.3 10*3/uL (ref 0.1–0.9)
NEUT#: 5.7 10*3/uL (ref 1.5–6.5)
NEUT%: 80.7 % — ABNORMAL HIGH (ref 38.4–76.8)
Platelets: 593 10*3/uL — ABNORMAL HIGH (ref 145–400)
RBC: 3.93 10*6/uL (ref 3.70–5.45)
WBC: 7 10*3/uL (ref 3.9–10.3)
lymph#: 0.8 10*3/uL — ABNORMAL LOW (ref 0.9–3.3)

## 2011-07-18 ENCOUNTER — Other Ambulatory Visit (HOSPITAL_BASED_OUTPATIENT_CLINIC_OR_DEPARTMENT_OTHER): Payer: Medicare PPO | Admitting: Lab

## 2011-07-18 ENCOUNTER — Other Ambulatory Visit: Payer: Self-pay | Admitting: Oncology

## 2011-07-18 DIAGNOSIS — D45 Polycythemia vera: Secondary | ICD-10-CM

## 2011-07-18 LAB — CBC WITH DIFFERENTIAL/PLATELET
BASO%: 0.8 % (ref 0.0–2.0)
Basophils Absolute: 0.1 10*3/uL (ref 0.0–0.1)
HCT: 37.1 % (ref 34.8–46.6)
HGB: 11.7 g/dL (ref 11.6–15.9)
LYMPH%: 14.1 % (ref 14.0–49.7)
MCHC: 31.5 g/dL (ref 31.5–36.0)
MONO#: 0.6 10*3/uL (ref 0.1–0.9)
NEUT%: 75.8 % (ref 38.4–76.8)
Platelets: 612 10*3/uL — ABNORMAL HIGH (ref 145–400)
WBC: 8.3 10*3/uL (ref 3.9–10.3)
lymph#: 1.2 10*3/uL (ref 0.9–3.3)

## 2011-07-24 ENCOUNTER — Telehealth: Payer: Self-pay | Admitting: *Deleted

## 2011-07-24 NOTE — Telephone Encounter (Signed)
Message copied by Caleb Popp on Mon Jul 24, 2011  6:13 PM ------      Message from: Ladene Artist      Created: Sat Jul 22, 2011  2:27 PM       Labs are ok, please call patient

## 2011-07-24 NOTE — Telephone Encounter (Signed)
Called pt with lab results. She verbalized understanding.

## 2011-08-18 ENCOUNTER — Telehealth: Payer: Self-pay | Admitting: Oncology

## 2011-08-18 NOTE — Telephone Encounter (Signed)
S/w pt today re appt for 1/25. °

## 2011-09-06 ENCOUNTER — Telehealth: Payer: Self-pay | Admitting: Oncology

## 2011-09-06 NOTE — Telephone Encounter (Signed)
pt called and r/s scheduled her appt for 01/25 to 10/23/2011

## 2011-09-08 ENCOUNTER — Ambulatory Visit: Payer: Medicare PPO | Admitting: Oncology

## 2011-09-08 ENCOUNTER — Other Ambulatory Visit: Payer: Medicare PPO | Admitting: Lab

## 2011-10-04 ENCOUNTER — Other Ambulatory Visit: Payer: Self-pay | Admitting: Oncology

## 2011-10-04 DIAGNOSIS — D45 Polycythemia vera: Secondary | ICD-10-CM

## 2011-10-05 DIAGNOSIS — T148XXA Other injury of unspecified body region, initial encounter: Secondary | ICD-10-CM

## 2011-10-05 HISTORY — DX: Other injury of unspecified body region, initial encounter: T14.8XXA

## 2011-10-12 ENCOUNTER — Emergency Department (HOSPITAL_COMMUNITY): Payer: Medicare Other

## 2011-10-12 ENCOUNTER — Inpatient Hospital Stay (HOSPITAL_COMMUNITY): Payer: Medicare Other | Admitting: *Deleted

## 2011-10-12 ENCOUNTER — Inpatient Hospital Stay (HOSPITAL_COMMUNITY)
Admission: EM | Admit: 2011-10-12 | Discharge: 2011-10-17 | DRG: 482 | Disposition: A | Discharge status: Skilled Nursing Facility | Payer: Medicare Other | Attending: Orthopedic Surgery | Admitting: Orthopedic Surgery

## 2011-10-12 ENCOUNTER — Encounter (HOSPITAL_COMMUNITY): Payer: Self-pay | Admitting: *Deleted

## 2011-10-12 ENCOUNTER — Encounter (HOSPITAL_COMMUNITY)
Admission: EM | Disposition: A | Discharge status: Skilled Nursing Facility | Payer: Self-pay | Source: Home / Self Care | Attending: Orthopedic Surgery

## 2011-10-12 ENCOUNTER — Encounter (HOSPITAL_COMMUNITY): Payer: Self-pay | Admitting: Emergency Medicine

## 2011-10-12 ENCOUNTER — Other Ambulatory Visit: Payer: Self-pay

## 2011-10-12 ENCOUNTER — Encounter (HOSPITAL_COMMUNITY): Payer: Self-pay | Admitting: Orthopedic Surgery

## 2011-10-12 DIAGNOSIS — S72001A Fracture of unspecified part of neck of right femur, initial encounter for closed fracture: Secondary | ICD-10-CM

## 2011-10-12 DIAGNOSIS — S72009A Fracture of unspecified part of neck of unspecified femur, initial encounter for closed fracture: Secondary | ICD-10-CM

## 2011-10-12 DIAGNOSIS — W19XXXA Unspecified fall, initial encounter: Secondary | ICD-10-CM | POA: Diagnosis present

## 2011-10-12 DIAGNOSIS — M810 Age-related osteoporosis without current pathological fracture: Secondary | ICD-10-CM | POA: Diagnosis present

## 2011-10-12 DIAGNOSIS — H409 Unspecified glaucoma: Secondary | ICD-10-CM | POA: Diagnosis present

## 2011-10-12 DIAGNOSIS — Y998 Other external cause status: Secondary | ICD-10-CM

## 2011-10-12 DIAGNOSIS — Z87891 Personal history of nicotine dependence: Secondary | ICD-10-CM

## 2011-10-12 DIAGNOSIS — Z7982 Long term (current) use of aspirin: Secondary | ICD-10-CM

## 2011-10-12 HISTORY — DX: Other injury of unspecified body region, initial encounter: T14.8XXA

## 2011-10-12 HISTORY — PX: HIP PINNING,CANNULATED: SHX1758

## 2011-10-12 HISTORY — DX: Fracture of unspecified part of neck of right femur, initial encounter for closed fracture: S72.001A

## 2011-10-12 HISTORY — DX: Essential (primary) hypertension: I10

## 2011-10-12 HISTORY — DX: Unspecified osteoarthritis, unspecified site: M19.90

## 2011-10-12 LAB — DIFFERENTIAL
Basophils Absolute: 0.1 10*3/uL (ref 0.0–0.1)
Lymphocytes Relative: 10 % — ABNORMAL LOW (ref 12–46)
Neutro Abs: 8.4 10*3/uL — ABNORMAL HIGH (ref 1.7–7.7)
Neutrophils Relative %: 82 % — ABNORMAL HIGH (ref 43–77)

## 2011-10-12 LAB — MRSA PCR SCREENING: MRSA by PCR: NEGATIVE

## 2011-10-12 LAB — COMPREHENSIVE METABOLIC PANEL
ALT: 25 U/L (ref 0–35)
AST: 26 U/L (ref 0–37)
Albumin: 4.1 g/dL (ref 3.5–5.2)
CO2: 26 mEq/L (ref 19–32)
Chloride: 104 mEq/L (ref 96–112)
GFR calc non Af Amer: 67 mL/min — ABNORMAL LOW (ref >90)
Potassium: 4.4 mEq/L (ref 3.5–5.1)
Sodium: 142 mEq/L (ref 135–145)
Total Bilirubin: 0.6 mg/dL (ref 0.3–1.2)

## 2011-10-12 LAB — CBC
HCT: 48.9 % — ABNORMAL HIGH (ref 36.0–46.0)
Platelets: 515 10*3/uL — ABNORMAL HIGH (ref 150–400)
RDW: 16.2 % — ABNORMAL HIGH (ref 11.5–15.5)
WBC: 10.2 10*3/uL (ref 4.0–10.5)

## 2011-10-12 LAB — ABO/RH: ABO/RH(D): A POS

## 2011-10-12 LAB — TYPE AND SCREEN

## 2011-10-12 SURGERY — FIXATION, FEMUR, NECK, PERCUTANEOUS, USING SCREW
Anesthesia: General | Site: Hip | Laterality: Right | Wound class: Clean

## 2011-10-12 MED ORDER — GLYCOPYRROLATE 0.2 MG/ML IJ SOLN
INTRAMUSCULAR | Status: DC | PRN
  Administered 2011-10-12: 0.6 mg via INTRAVENOUS

## 2011-10-12 MED ORDER — PROPOFOL 10 MG/ML IV EMUL
INTRAVENOUS | Status: DC | PRN
  Administered 2011-10-12: 120 mg via INTRAVENOUS

## 2011-10-12 MED ORDER — MIDAZOLAM HCL 5 MG/5ML IJ SOLN
INTRAMUSCULAR | Status: DC | PRN
  Administered 2011-10-12: 1 mg via INTRAVENOUS

## 2011-10-12 MED ORDER — CEFAZOLIN SODIUM 1-5 GM-% IV SOLN
1.0000 g | INTRAVENOUS | Status: AC
  Administered 2011-10-12: 1 g via INTRAVENOUS
  Filled 2011-10-12: qty 50

## 2011-10-12 MED ORDER — FENTANYL CITRATE 0.05 MG/ML IJ SOLN
INTRAMUSCULAR | Status: DC | PRN
  Administered 2011-10-12: 150 ug via INTRAVENOUS

## 2011-10-12 MED ORDER — FENTANYL CITRATE 0.05 MG/ML IJ SOLN
50.0000 ug | Freq: Once | INTRAMUSCULAR | Status: AC
  Administered 2011-10-12: 50 ug via INTRAVENOUS
  Filled 2011-10-12: qty 2

## 2011-10-12 MED ORDER — MORPHINE SULFATE 2 MG/ML IJ SOLN: 0.5000 mg | INTRAMUSCULAR | Status: DC | PRN

## 2011-10-12 MED ORDER — HYDROCODONE-ACETAMINOPHEN 10-325 MG PO TABS: 1.0000 | ORAL_TABLET | Freq: Four times a day (QID) | ORAL | Status: AC | PRN

## 2011-10-12 MED ORDER — HYDROMORPHONE HCL PF 1 MG/ML IJ SOLN
1.0000 mg | Freq: Once | INTRAMUSCULAR | Status: AC
  Administered 2011-10-12: 1 mg via INTRAVENOUS
  Filled 2011-10-12: qty 1

## 2011-10-12 MED ORDER — POTASSIUM CHLORIDE IN NACL 20-0.45 MEQ/L-% IV SOLN
INTRAVENOUS | Status: DC
  Administered 2011-10-12: 16:00:00 via INTRAVENOUS
  Filled 2011-10-12 (×4): qty 1000

## 2011-10-12 MED ORDER — ROCURONIUM BROMIDE 100 MG/10ML IV SOLN
INTRAVENOUS | Status: DC | PRN
  Administered 2011-10-12: 40 mg via INTRAVENOUS

## 2011-10-12 MED ORDER — LACTATED RINGERS IV SOLN
INTRAVENOUS | Status: DC | PRN
  Administered 2011-10-12: 21:00:00 via INTRAVENOUS

## 2011-10-12 MED ORDER — BENAZEPRIL HCL 40 MG PO TABS
40.0000 mg | ORAL_TABLET | Freq: Every day | ORAL | Status: DC
  Filled 2011-10-12: qty 1

## 2011-10-12 MED ORDER — ONDANSETRON HCL 4 MG/2ML IJ SOLN
4.0000 mg | Freq: Once | INTRAMUSCULAR | Status: AC
  Administered 2011-10-12: 4 mg via INTRAVENOUS
  Filled 2011-10-12: qty 2

## 2011-10-12 MED ORDER — CALCIUM CARBONATE-VITAMIN D 500-200 MG-UNIT PO TABS: 1.0000 | ORAL_TABLET | Freq: Every day | ORAL | Status: DC

## 2011-10-12 MED ORDER — ONDANSETRON HCL 4 MG/2ML IJ SOLN
INTRAMUSCULAR | Status: DC | PRN
  Administered 2011-10-12: 4 mg via INTRAVENOUS

## 2011-10-12 MED ORDER — WARFARIN SODIUM 5 MG PO TABS: 5.0000 mg | ORAL_TABLET | Freq: Every day | ORAL | Status: DC

## 2011-10-12 MED ORDER — NEOSTIGMINE METHYLSULFATE 1 MG/ML IJ SOLN
INTRAMUSCULAR | Status: DC | PRN
  Administered 2011-10-12: 5 mg via INTRAVENOUS

## 2011-10-12 MED ORDER — SODIUM CHLORIDE 0.9 % IV SOLN
Freq: Once | INTRAVENOUS | Status: AC
  Administered 2011-10-12: 13:00:00 via INTRAVENOUS

## 2011-10-12 MED ORDER — HYDROXYUREA 500 MG PO CAPS
500.0000 mg | ORAL_CAPSULE | Freq: Two times a day (BID) | ORAL | Status: DC
  Filled 2011-10-12: qty 1

## 2011-10-12 MED ORDER — ESMOLOL HCL 10 MG/ML IV SOLN
INTRAVENOUS | Status: DC | PRN
  Administered 2011-10-12: 10 mg via INTRAVENOUS

## 2011-10-12 MED ORDER — ASPIRIN EC 81 MG PO TBEC
81.0000 mg | DELAYED_RELEASE_TABLET | Freq: Every day | ORAL | Status: DC
  Filled 2011-10-12: qty 1

## 2011-10-12 MED ORDER — AMLODIPINE BESYLATE 10 MG PO TABS
10.0000 mg | ORAL_TABLET | Freq: Every day | ORAL | Status: DC
  Filled 2011-10-12: qty 1

## 2011-10-12 MED ORDER — HYDROCODONE-ACETAMINOPHEN 5-325 MG PO TABS
1.0000 | ORAL_TABLET | ORAL | Status: DC | PRN
  Administered 2011-10-14 – 2011-10-15 (×2): 1 via ORAL
  Filled 2011-10-12 (×3): qty 1

## 2011-10-12 SURGICAL SUPPLY — 31 items
BOOTCOVER CLEANROOM LRG (PROTECTIVE WEAR) ×4 IMPLANT
CLOTH BEACON ORANGE TIMEOUT ST (SAFETY) ×2 IMPLANT
COVER SURGICAL LIGHT HANDLE (MISCELLANEOUS) ×2 IMPLANT
DRAPE STERI IOBAN 125X83 (DRAPES) ×2 IMPLANT
DRSG MEPILEX BORDER 4X4 (GAUZE/BANDAGES/DRESSINGS) ×2 IMPLANT
DURAPREP 26ML APPLICATOR (WOUND CARE) ×2 IMPLANT
ELECT REM PT RETURN 9FT ADLT (ELECTROSURGICAL) ×2
ELECTRODE REM PT RTRN 9FT ADLT (ELECTROSURGICAL) ×1 IMPLANT
GLOVE BIOGEL PI IND STRL 8 (GLOVE) ×1 IMPLANT
GLOVE BIOGEL PI INDICATOR 8 (GLOVE) ×2
GLOVE ORTHO TXT STRL SZ7.5 (GLOVE) ×3 IMPLANT
GLOVE SURG ORTHO 8.0 STRL STRW (GLOVE) ×3 IMPLANT
GOWN STRL NON-REIN LRG LVL3 (GOWN DISPOSABLE) ×3 IMPLANT
KIT BASIN OR (CUSTOM PROCEDURE TRAY) ×2 IMPLANT
KIT ROOM TURNOVER OR (KITS) ×2 IMPLANT
MANIFOLD NEPTUNE II (INSTRUMENTS) ×2 IMPLANT
NEEDLE 22X1 1/2 (OR ONLY) (NEEDLE) ×2 IMPLANT
NS IRRIG 1000ML POUR BTL (IV SOLUTION) ×2 IMPLANT
PACK GENERAL/GYN (CUSTOM PROCEDURE TRAY) ×2 IMPLANT
PAD ARMBOARD 7.5X6 YLW CONV (MISCELLANEOUS) ×4 IMPLANT
SCREW CANN 16 THRD/85 7.3 (Screw) ×2 IMPLANT
SCREW CANN 16 THRD/95 7.3 (Screw) ×2 IMPLANT
SUT VIC AB 2-0 FS1 27 (SUTURE) ×2 IMPLANT
SUT VIC AB 2-0 SH 27 (SUTURE)
SUT VIC AB 2-0 SH 27XBRD (SUTURE) IMPLANT
SUT VIC AB 3-0 SH 27 (SUTURE) ×2
SUT VIC AB 3-0 SH 27X BRD (SUTURE) IMPLANT
SYR CONTROL 10ML LL (SYRINGE) ×2 IMPLANT
TOWEL OR 17X24 6PK STRL BLUE (TOWEL DISPOSABLE) ×2 IMPLANT
TOWEL OR 17X26 10 PK STRL BLUE (TOWEL DISPOSABLE) ×2 IMPLANT
WATER STERILE IRR 1000ML POUR (IV SOLUTION) ×1 IMPLANT

## 2011-10-12 NOTE — ED Notes (Signed)
Pt tripped on a telephone cord 1 week ago and fell 1 week ago. Injury to R hip. Seen by Dr Margaretha Sheffield this am and sent here after x rays.

## 2011-10-12 NOTE — Anesthesia Preprocedure Evaluation (Addendum)
Anesthesia Evaluation  Patient identified by MRN, date of birth, ID band Patient awake    Reviewed: Allergy & Precautions, H&P , NPO status , Patient's Chart, lab work & pertinent test results  Airway Mallampati: II TM Distance: >3 FB Neck ROM: Full    Dental  (+) Teeth Intact, Missing and Dental Advisory Given   Pulmonary neg pulmonary ROS,  clear to auscultation  Pulmonary exam normal       Cardiovascular hypertension, Pt. on medications Regular Normal    Neuro/Psych Negative Neurological ROS     GI/Hepatic negative GI ROS, Neg liver ROS,   Endo/Other  Negative Endocrine ROS  Renal/GU negative Renal ROS     Musculoskeletal  (+) Arthritis -,   Abdominal Normal abdominal exam  (+)   Peds  Hematology negative hematology ROS (+)   Anesthesia Other Findings   Reproductive/Obstetrics                           Anesthesia Physical Anesthesia Plan  ASA: II  Anesthesia Plan: General   Post-op Pain Management:    Induction: Intravenous  Airway Management Planned: Oral ETT  Additional Equipment:   Intra-op Plan:   Post-operative Plan: Extubation in OR  Informed Consent: I have reviewed the patients History and Physical, chart, labs and discussed the procedure including the risks, benefits and alternatives for the proposed anesthesia with the patient or authorized representative who has indicated his/her understanding and acceptance.   Dental advisory given  Plan Discussed with: Surgeon and CRNA  Anesthesia Plan Comments:        Anesthesia Quick Evaluation

## 2011-10-12 NOTE — H&P (Signed)
PREOPERATIVE H&P  Chief Complaint: Right hip pain  HPI: Alejandra [name] is a 71 y.o. female who presents for preoperative history and physical after having had a fall one week ago and injured her right hip. She had acute onset pain and was unable to walk. After some rest however she was able to walk, and she is taking some over-the-counter pain medications, and presented to our office earlier today and was seen by Dr. Margaretha Sheffield. X-rays were taken, which demonstrated evidence for a right femoral neck fracture. He complains of moderate to severe pain directly around the right groin worse with activities ambulation. Resting makes it better. She has never had anything like this before.  Past Medical History  Diagnosis Date  . Arthritis   . Glaucoma   . Hypertension   . Fracture 10/05/11    "fell and broke right hip"   Past Surgical History  Procedure Date  . No past surgeries    History   Social History  . Marital Status: Widowed    Spouse Name: N/A    Number of Children: N/A  . Years of Education: N/A   Social History Main Topics  . Smoking status: Former Smoker -- 0.1 packs/day    Types: Cigarettes    Quit date: 08/15/1991  . Smokeless tobacco: Never Used  . Alcohol Use: Yes     10/12/11 "did drink in my younger; don't drink at all now"  . Drug Use: No  . Sexually Active: No   Other Topics Concern  . None   Social History Narrative  . None   History reviewed. No pertinent family history. her mother died of cancer and her father of heart disease. No Known Allergies Prior to Admission medications   Medication Sig Start Date End Date Taking? Authorizing Provider  amLODipine (NORVASC) 10 MG tablet Take 10 mg by mouth daily.   Yes Historical Provider, MD  aspirin EC 81 MG tablet Take 81 mg by mouth daily.   Yes Historical Provider, MD  benazepril (LOTENSIN) 40 MG tablet Take 40 mg by mouth daily.   Yes Historical Provider, MD  hydroxyurea (HYDREA) 500 MG capsule Take 500 mg by  mouth 2 (two) times daily. May take with food to minimize GI side effects.   Yes Historical Provider, MD     Positive ROS: All other systems have been reviewed and were otherwise negative with the exception of those mentioned in the HPI and as above.  Physical Exam: General: Alert, no acute distress Cardiovascular: No pedal edema Respiratory: No cyanosis, no use of accessory musculature GI: No organomegaly, abdomen is soft and non-tender Skin: No lesions in the area of chief complaint Neurologic: Sensation intact distally Psychiatric: Patient is competent for consent with normal mood and affect Lymphatic: No axillary or cervical lymphadenopathy  MUSCULOSKELETAL: Right hip is held in a flexed position, and has pain with movement. EHL and FHL are firing.  CBC    Component Value Date/Time   WBC 10.2 10/12/2011 1200   WBC 8.3 07/18/2011 1357   RBC 5.68* 10/12/2011 1200   RBC 3.92 07/18/2011 1357   HGB 16.2* 10/12/2011 1200   HGB 11.7 07/18/2011 1357   HCT 48.9* 10/12/2011 1200   HCT 37.1 07/18/2011 1357   PLT 515* 10/12/2011 1200   PLT 612* 07/18/2011 1357   MCV 86.1 10/12/2011 1200   MCV 94.6 07/18/2011 1357   MCH 28.5 10/12/2011 1200   MCH 29.8 07/18/2011 1357   MCHC 33.1 10/12/2011 1200  MCHC 31.5 07/18/2011 1357   RDW 16.2* 10/12/2011 1200   RDW 17.1* 07/18/2011 1357   LYMPHSABS 1.0 10/12/2011 1200   LYMPHSABS 1.2 07/18/2011 1357   MONOABS 0.5 10/12/2011 1200   MONOABS 0.6 07/18/2011 1357   EOSABS 0.2 10/12/2011 1200   EOSABS 0.2 07/18/2011 1357   BASOSABS 0.1 10/12/2011 1200   BASOSABS 0.1 07/18/2011 1357    Dg Chest 2 View  10/12/2011  *RADIOLOGY REPORT*  Clinical Data: Preoperative assessment, hip fracture  CHEST - 2 VIEW  Comparison: None  Findings: Enlargement of cardiac silhouette. Mediastinal contours and pulmonary vascularity normal. Lungs clear. No pleural effusion or pneumothorax. End plate spur formation thoracic spine. Osseous demineralization. Probable bilateral chronic rotator  cuff tears.  IMPRESSION: Enlargement of cardiac silhouette. No acute abnormalities.  Original Report Authenticated By: Lollie Marrow, M.D.   Assessment: Right femoral neck fracture, impacted  Plan: Plan for Procedure(s): CANNULATED HIP PINNING  The risks benefits and alternatives were discussed with the patient including but not limited to the risks of nonoperative treatment, versus surgical intervention including infection, bleeding, nerve injury, malunion, nonunion, the need for revision surgery, hardware prominence, hardware failure, the need for hardware removal, blood clots, cardiopulmonary complications, morbidity, mortality, among others, and they were willing to proceed.  Predicted outcome is good, although there will be at least a six to nine month expected recovery. We also discussed the risks of avascular necrosis, and the potential for hemiarthroplasty. She will be admitted to the hospital to the orthopedic service, and we will plan for surgery later on this evening   Shyler Holzman P, MD 10/12/2011 6:01 PM

## 2011-10-12 NOTE — Preoperative (Signed)
Beta Blockers   Reason not to administer Beta Blockers:Not Applicable 

## 2011-10-12 NOTE — ED Provider Notes (Signed)
History     CSN: [id number]  Arrival date & time 10/12/11  1048   First MD Initiated Contact with Patient 10/12/11 1136      Chief Complaint  Patient presents with  . Hip Pain    (Consider location/radiation/quality/duration/timing/severity/associated sxs/prior treatment) HPI Comments: Patient presents with right hip pain that she's had for about the past week. She had a mechanical fall over a telephone cord landing on her right hip and has had pain ever since. She taking naproxen. Her Dr. today and had an x-ray that showed broken proximal femur. Sent for orthopedic intervention. She denies hitting her head or losing consciousness. She has no chest pain, shortness of breath, fever, chills, back pain or neck pain. She is able to bear some weight on the leg. No weakness, numbness or tingling.  Patient is a 71 y.o. female presenting with hip pain. The history is provided by the patient.  Hip Pain This is a new problem. The current episode started more than 2 days ago. The problem occurs constantly. The problem has not changed since onset.Pertinent negatives include no chest pain, no abdominal pain and no headaches. The symptoms are aggravated by walking. The symptoms are relieved by nothing.    Past Medical History  Diagnosis Date  . Arthritis   . Glaucoma   . Hypertension   . Fracture 10/05/11    "fell and broke right hip"    Past Surgical History  Procedure Date  . No past surgeries     History reviewed. No pertinent family history.  History  Substance Use Topics  . Smoking status: Former Smoker -- 0.1 packs/day    Types: Cigarettes    Quit date: 08/15/1991  . Smokeless tobacco: Never Used  . Alcohol Use: Yes     10/12/11 "did drink in my younger; don't drink at all now"    OB History    Grav Para Term Preterm Abortions TAB SAB Ect Mult Living                  Review of Systems  Constitutional: Negative for fever, activity change and appetite change.  HENT:  Negative for congestion and rhinorrhea.   Cardiovascular: Negative for chest pain.  Gastrointestinal: Negative for nausea, vomiting and abdominal pain.  Genitourinary: Negative for vaginal bleeding and vaginal discharge.  Musculoskeletal: Positive for myalgias, joint swelling and gait problem.  Neurological: Negative for headaches.    Allergies  Review of patient's allergies indicates no known allergies.  Home Medications   No current outpatient prescriptions on file.  BP 149/95  Pulse 71  Temp 98.7 F (37.1 C)  Resp 18  SpO2 100%  Physical Exam  Constitutional: She is oriented to person, place, and time. She appears well-developed and well-nourished. No distress.  HENT:  Head: Normocephalic and atraumatic.  Mouth/Throat: Oropharynx is clear and moist. No oropharyngeal exudate.  Eyes: Conjunctivae are normal. Pupils are equal, round, and reactive to light.  Neck: Normal range of motion. Neck supple.       No C-spine pain, step-off or deformity  Cardiovascular: Normal rate, regular rhythm and normal heart sounds.   Pulmonary/Chest: Effort normal and breath sounds normal. No respiratory distress.  Abdominal: Soft. There is no tenderness. There is no rebound and no guarding.  Musculoskeletal: Normal range of motion. She exhibits no edema.       Right lower extremity is shortened. +2 DP and PT pulses, ankle dorsi and plantar flexion intact.  Neurological: She is alert and  oriented to person, place, and time. No cranial nerve deficit.  Skin: Skin is warm.    ED Course  Procedures (including critical care time)  Labs Reviewed  CBC - Abnormal; Notable for the following:    RBC 5.68 (*)    Hemoglobin 16.2 (*)    HCT 48.9 (*)    RDW 16.2 (*)    Platelets 515 (*)    All other components within normal limits  DIFFERENTIAL - Abnormal; Notable for the following:    Neutrophils Relative 82 (*)    Neutro Abs 8.4 (*)    Lymphocytes Relative 10 (*)    All other components within  normal limits  COMPREHENSIVE METABOLIC PANEL - Abnormal; Notable for the following:    Calcium 11.0 (*)    Total Protein 8.6 (*)    GFR calc non Af Amer 67 (*)    GFR calc Af Amer 78 (*)    All other components within normal limits  PROTIME-INR - Abnormal; Notable for the following:    Prothrombin Time 15.6 (*)    All other components within normal limits  TYPE AND SCREEN  ABO/RH  MRSA PCR SCREENING   Dg Chest 2 View  10/12/2011  *RADIOLOGY REPORT*  Clinical Data: Preoperative assessment, hip fracture  CHEST - 2 VIEW  Comparison: None  Findings: Enlargement of cardiac silhouette. Mediastinal contours and pulmonary vascularity normal. Lungs clear. No pleural effusion or pneumothorax. End plate spur formation thoracic spine. Osseous demineralization. Probable bilateral chronic rotator cuff tears.  IMPRESSION: Enlargement of cardiac silhouette. No acute abnormalities.  Original Report Authenticated By: Lollie Marrow, M.D.     1. Hip fracture       MDM  Remote fall with right hip fracture. Neurovascular intact.  D/w Dr. Dion Saucier who is already aware of patient and will admit. Requests screening labs.   Date: 10/12/2011  Rate: 66  Rhythm: normal sinus rhythm  QRS Axis: normal  Intervals: normal  ST/T Wave abnormalities: normal  Conduction Disutrbances:none  Narrative Interpretation:   Old EKG Reviewed: none available          Glynn Octave, MD 10/12/11 2149

## 2011-10-12 NOTE — Op Note (Signed)
10/12/2011  11:35 PM  PATIENT:  Alejandra [name]    PRE-OPERATIVE DIAGNOSIS:  valgus impacted right femoral neck fracture  POST-OPERATIVE DIAGNOSIS:  Same  PROCEDURE:  CANNULATED HIP PINNING  SURGEON:  Eulas Post, MD  PHYSICIAN ASSISTANT: Janace Litten, OPA-C, present and scrubbed throughout the case, critical for completion in a timely fashion, and for retraction, instrumentation, and closure.  ANESTHESIA:   General  PREOPERATIVE INDICATIONS:  Alejandra [name] is a  71 y.o. female who fell and was found to have a diagnosis of valgus impacted right femoral neck fracture who elected for surgical management.    The risks benefits and alternatives were discussed with the patient preoperatively including but not limited to the risks of infection, bleeding, nerve injury, cardiopulmonary complications, blood clots, malunion, nonunion, avascular necrosis, the need for revision surgery, the potential for conversion to hemiarthroplasty, among others, and the patient was willing to proceed.  OPERATIVE IMPLANTS: 7.3 mm cannulated screws x3  OPERATIVE FINDINGS: Clinical osteoporosis with weak bone, proximal femur  OPERATIVE PROCEDURE: The patient was brought to the operating room and placed in supine position. IV antibiotics were given. General anesthesia administered. Foley was also given. She was placed on the fracture table. The operative extremity was positioned, without any significant reduction maneuver and was prepped and draped in usual sterile fashion.  Time out was performed. I assessed the stability of the fracture under live fluoroscopy, and the neck actually rotated independent of the head, although it was perched in a valgus position, although I was a little bit concerned because of the independent motion. Nonetheless due to the valgus impacted nature of it, I did feel that it was appropriate for pinning, particularly in light of the fact that she had been walking on it for over a  week.  Small incision was made distal to the greater trochanter, and 3 guidewires were introduced Into an inverted triangle configuration. The lengths were measured. The reduction was slightly valgus, and near-anatomic. I opened the cortex with a cannulated drill, and then placed the screws into position. Satisfactory fixation was achieved.  The wounds were irrigated copiously, and repaired with Vicryl with Steri-Strips and sterile gauze. There no complications and the patient tolerated the procedure well.  The patient will be weightbearing as tolerated, and will be on Lovenox bridging to Coumadin for a period of one month postoperatively.

## 2011-10-12 NOTE — ED Notes (Signed)
Larey Seat last wed went to see dr today and has broken  Rt hip sent for further tests has been limping along she staes

## 2011-10-12 NOTE — Discharge Instructions (Signed)
Hip Fracture (Upper Femoral Fracture) You have a hip fracture (break in bone). This is a fracture of the upper part of the big bone (femur, thigh bone) between your hip and knee. If your caregiver feels it is a stable fracture, occasionally it can be treated without surgery. Usually these fractures are unstable. This means that the bones will not heal properly without surgery. Surgery is necessary to hold the bones together in a good position where they will heal well. DIAGNOSIS A physical exam can determine if a fracture has occurred. X-ray studies are needed to see what type of fracture is present and to look for other injuries. These studies will help your caregiver determine what the best treatment is for you. If there is more than one option, your caregiver can give you the information needed to help you decide on the treatment. TREATMENT  The treatment for an unstable fracture is usually surgery. This means using a screw, nail, or rod to hold the bones in place.  RISKS AND COMPLICATIONS All surgery is associated with risks. Sometimes the implant may fail. Other complications of surgery include infection or the bones not healing properly. Sometimes the fracture may damage the blood supply to the head of the femur. That portion of bone may die (osteonecrosis or avascular necrosis). Sometimes to avoid this complication, an implant is used which just replaces the ball of the femur (hemi-arthroplasty or prosthetic replacement). Some of the other risks are:  Excessive bleeding.   Infection.   Dislocation if a hemi-arthroplasty or a total hip was inserted.   Failure to heal properly resulting in an unstable hip.   Stiffness of hip following repair.   On occasion, blood may have to be replaced before or during the procedure  LET YOUR CAREGIVERS KNOW ABOUT:  Allergies.   Medications taken including herbs, eye drops, over the counter medications, and creams.   Use of steroids (by mouth or  creams).   History of bleeding or blood problems.   History of serious infection.   Previous problems with anesthetics or novocaine.   Possibility of pregnancy, if this applies.   History of blood clots (thrombophlebitis).   Previous surgery.   Other health problems.  BEFORE THE PROCEDURE Before surgery, an IV (intravenous line connected to your vein) may be started. You will be given an anesthetic (medications and gas to make you sleep) or given medications in your back to make you numb from the waist down (spinal anesthetic). AFTER THE PROCEDURE After surgery, you will be taken to the recovery area where a nurse will watch your progress. You may have a catheter (a long, narrow, hollow tube) in your bladder that helps you pass your water. Once you're awake, stable, and taking fluids well, you will be returned to your room. You will receive physical therapy and other care until you are doing well and your caregiver feels it is safe for you to be transferred either to home or to an extended care facility. Your activity level will change as your caregiver determines what is best for you.  You may resume normal diet and activities as directed or allowed.   Change dressings if necessary or as directed.   Only take over-the-counter or prescription medicines for pain, discomfort, or fever as directed by your caregiver.   You may be placed on blood thinners for 4-6 weeks to prevent blood clots.  SEEK IMMEDIATE MEDICAL CARE IF:  There is swelling of your calf or leg.   You  have shortness of breath or chest pain.   There is redness, swelling, or increasing pain in the wound.   There is pus coming from wound.   You have an unexplained oral temperature above 102 F (38.9 C).   There is a foul (bad) smell coming from the wound or dressing.   There is a breaking open of the wound (edges not staying together) after sutures or staples have been removed.   There is a marked increase in  pain or shortening of the leg.   You have severe pain anywhere in the leg.   There is any change in color or temperature of your leg below the injury.  MAKE SURE YOU:   Understand these instructions.   Will watch your condition.   Will get help right away if you are not doing well or get worse.  Document Released: 07/31/2005 Document Revised: 04/12/2011 Document Reviewed: 03/20/2008 [facility] Patient Information 2012 [place], [place].

## 2011-10-13 ENCOUNTER — Inpatient Hospital Stay (HOSPITAL_COMMUNITY): Payer: Medicare Other

## 2011-10-13 ENCOUNTER — Encounter (HOSPITAL_COMMUNITY): Payer: Self-pay | Admitting: *Deleted

## 2011-10-13 LAB — CBC
HCT: 45 % (ref 36.0–46.0)
Hemoglobin: 13.5 g/dL (ref 12.0–15.0)
MCH: 26.5 pg (ref 26.0–34.0)
MCHC: 30 g/dL (ref 30.0–36.0)
RBC: 5.09 MIL/uL (ref 3.87–5.11)

## 2011-10-13 LAB — BASIC METABOLIC PANEL
BUN: 12 mg/dL (ref 6–23)
CO2: 26 mEq/L (ref 19–32)
GFR calc non Af Amer: 65 mL/min — ABNORMAL LOW (ref >90)
Glucose, Bld: 79 mg/dL (ref 70–99)
Potassium: 4.2 mEq/L (ref 3.5–5.1)
Sodium: 143 mEq/L (ref 135–145)

## 2011-10-13 LAB — PROTIME-INR: Prothrombin Time: 16.9 seconds — ABNORMAL HIGH (ref 11.6–15.2)

## 2011-10-13 MED ORDER — WARFARIN - PHARMACIST DOSING INPATIENT
Freq: Every day | Status: DC
  Filled 2011-10-13 (×5): qty 1

## 2011-10-13 MED ORDER — METHOCARBAMOL 500 MG PO TABS: 500.0000 mg | ORAL_TABLET | Freq: Four times a day (QID) | ORAL | Status: DC | PRN

## 2011-10-13 MED ORDER — HYDROCODONE-ACETAMINOPHEN 5-325 MG PO TABS
1.0000 | ORAL_TABLET | ORAL | Status: DC | PRN
  Administered 2011-10-13 – 2011-10-16 (×7): 1 via ORAL
  Filled 2011-10-13 (×6): qty 1

## 2011-10-13 MED ORDER — ONDANSETRON HCL 4 MG PO TABS: 4.0000 mg | ORAL_TABLET | Freq: Four times a day (QID) | ORAL | Status: DC | PRN

## 2011-10-13 MED ORDER — METOCLOPRAMIDE HCL 10 MG PO TABS: 5.0000 mg | ORAL_TABLET | Freq: Three times a day (TID) | ORAL | Status: DC | PRN

## 2011-10-13 MED ORDER — PHENOL 1.4 % MT LIQD: 1.0000 | OROMUCOSAL | Status: DC | PRN

## 2011-10-13 MED ORDER — ZOLPIDEM TARTRATE 5 MG PO TABS: 5.0000 mg | ORAL_TABLET | Freq: Every evening | ORAL | Status: DC | PRN

## 2011-10-13 MED ORDER — ENOXAPARIN SODIUM 40 MG/0.4ML ~~LOC~~ SOLN
40.0000 mg | SUBCUTANEOUS | Status: DC
  Administered 2011-10-13 – 2011-10-16 (×3): 40 mg via SUBCUTANEOUS
  Filled 2011-10-13 (×5): qty 0.4

## 2011-10-13 MED ORDER — POTASSIUM CHLORIDE IN NACL 20-0.45 MEQ/L-% IV SOLN
INTRAVENOUS | Status: DC
  Administered 2011-10-13 – 2011-10-14 (×2): via INTRAVENOUS
  Filled 2011-10-13 (×7): qty 1000

## 2011-10-13 MED ORDER — METHOCARBAMOL 100 MG/ML IJ SOLN
500.0000 mg | Freq: Four times a day (QID) | INTRAVENOUS | Status: DC | PRN
  Filled 2011-10-13: qty 5

## 2011-10-13 MED ORDER — MORPHINE SULFATE 2 MG/ML IJ SOLN: 0.5000 mg | INTRAMUSCULAR | Status: DC | PRN

## 2011-10-13 MED ORDER — ONDANSETRON HCL 4 MG/2ML IJ SOLN: 4.0000 mg | Freq: Four times a day (QID) | INTRAMUSCULAR | Status: DC | PRN

## 2011-10-13 MED ORDER — SENNA 8.6 MG PO TABS
1.0000 | ORAL_TABLET | Freq: Two times a day (BID) | ORAL | Status: DC
  Administered 2011-10-13 – 2011-10-17 (×9): 8.6 mg via ORAL
  Filled 2011-10-13 (×11): qty 1

## 2011-10-13 MED ORDER — WARFARIN SODIUM 5 MG PO TABS
5.0000 mg | ORAL_TABLET | Freq: Once | ORAL | Status: AC
  Administered 2011-10-13: 5 mg via ORAL
  Filled 2011-10-13: qty 1

## 2011-10-13 MED ORDER — ACETAMINOPHEN 325 MG PO TABS: 650.0000 mg | ORAL_TABLET | Freq: Four times a day (QID) | ORAL | Status: DC | PRN

## 2011-10-13 MED ORDER — DOCUSATE SODIUM 100 MG PO CAPS
100.0000 mg | ORAL_CAPSULE | Freq: Two times a day (BID) | ORAL | Status: DC
  Administered 2011-10-13 – 2011-10-17 (×10): 100 mg via ORAL
  Filled 2011-10-13 (×13): qty 1

## 2011-10-13 MED ORDER — METOCLOPRAMIDE HCL 5 MG/ML IJ SOLN: 5.0000 mg | Freq: Three times a day (TID) | INTRAMUSCULAR | Status: DC | PRN

## 2011-10-13 MED ORDER — CEFAZOLIN SODIUM 1-5 GM-% IV SOLN
1.0000 g | Freq: Four times a day (QID) | INTRAVENOUS | Status: AC
  Administered 2011-10-13 (×3): 1 g via INTRAVENOUS
  Filled 2011-10-13 (×3): qty 50

## 2011-10-13 MED ORDER — ACETAMINOPHEN 650 MG RE SUPP: 650.0000 mg | Freq: Four times a day (QID) | RECTAL | Status: DC | PRN

## 2011-10-13 MED ORDER — MENTHOL 3 MG MT LOZG: 1.0000 | LOZENGE | OROMUCOSAL | Status: DC | PRN

## 2011-10-13 MED ORDER — ALUM & MAG HYDROXIDE-SIMETH 200-200-20 MG/5ML PO SUSP: 30.0000 mL | ORAL | Status: DC | PRN

## 2011-10-13 NOTE — Progress Notes (Signed)
Subjective: 1 Day Post-Op Procedure(s) (LRB): CANNULATED HIP PINNING (Right) Patient reports pain as moderate.  She states that she is doing fine and her does not hurt like it did before surgery.  Objective: Vital signs in last 24 hours: Temp:  [97.7 F (36.5 C)-98.7 F (37.1 C)] 97.9 F (36.6 C) (03/01 0436) Pulse Rate:  [60-72] 60  (03/01 0436) Resp:  [16-20] 18  (03/01 0436) BP: (126-156)/(74-98) 130/74 mmHg (03/01 0436) SpO2:  [95 %-100 %] 96 % (03/01 0436)  Intake/Output from previous day: 02/28 0701 - 03/01 0700 In: 2840 [P.O.:240; I.V.:2550; IV Piggyback:50] Out: 550 [Urine:400; Blood:150] Intake/Output this shift: Total I/O In: 2840 [P.O.:240; I.V.:2550; IV Piggyback:50] Out: 350 [Urine:200; Blood:150]   Basename 10/12/11 1200  HGB 16.2*    Basename 10/12/11 1200  WBC 10.2  RBC 5.68*  HCT 48.9*  PLT 515*    Basename 10/12/11 1200  NA 142  K 4.4  CL 104  CO2 26  BUN 23  CREATININE 0.85  GLUCOSE 93  CALCIUM 11.0*    Basename 10/12/11 1200  LABPT --  INR 1.21    Sensation intact distally Intact pulses distally Dorsiflexion/Plantar flexion intact Incision: dressing C/D/I, no drainage and scant drainage   Assessment/Plan: 1 Day Post-Op Procedure(s) (LRB): CANNULATED HIP PINNING (Right) Advance diet Up with therapy Plan for SNF on Monday.  Haskel Khan 10/13/2011, 6:55 AM

## 2011-10-13 NOTE — Transfer of Care (Signed)
Immediate Anesthesia Transfer of Care Note  Patient: Alejandra [name]  Procedure(s) Performed: Procedure(s) (LRB): CANNULATED HIP PINNING (Right)  Patient Location: PACU  Anesthesia Type: General  Level of Consciousness: awake and alert   Airway & Oxygen Therapy: Patient Spontanous Breathing and Patient connected to face mask oxygen  Post-op Assessment: Report given to PACU RN and Post -op Vital signs reviewed and stable  Post vital signs: Reviewed and stable  Complications: No apparent anesthesia complications

## 2011-10-13 NOTE — Anesthesia Postprocedure Evaluation (Signed)
  Anesthesia Post-op Note  Patient: Alejandra [name]  Procedure(s) Performed: Procedure(s) (LRB): CANNULATED HIP PINNING (Right)  Patient Location: PACU  Anesthesia Type: General  Level of Consciousness: awake and alert   Airway and Oxygen Therapy: Patient Spontanous Breathing  Post-op Pain: mild  Post-op Assessment: Post-op Vital signs reviewed, Patient's Cardiovascular Status Stable, Respiratory Function Stable, Patent Airway, No signs of Nausea or vomiting and Pain level controlled  Post-op Vital Signs: stable  Complications: No apparent anesthesia complications

## 2011-10-13 NOTE — Progress Notes (Signed)
OT NOTE: OT consult received and appreciated. Attempted evaluation and noted pt. With plans to D/C to SNF hopefully Monday. Per pt. Request will defer any OT needs to SNF and sign off acutely. Thanks!  Cassandria Anger, OTR/L Pager: [phone] 10/13/2011 .

## 2011-10-13 NOTE — Progress Notes (Signed)
Physical Therapy Evaluation  Past Medical History  Diagnosis Date  . Arthritis   . Glaucoma   . Hypertension   . Fracture 10/05/11    "fell and broke right hip"  . Fracture of femoral neck, right 10/12/2011   Past Surgical History  Procedure Date  . No past surgeries     10/13/11 1100  PT Visit Information  Last PT Received On 10/13/11  Patient Stated Goals  Goal #1 Home  Precautions  Precautions Fall  Restrictions  Weight Bearing Restrictions Yes  Home Living  Lives With Alone  Receives Help From Family (Can only check in on her.  )  Type of Home House  Home Layout One level  Home Access Stairs to enter  Entrance Stairs-Rails Right  Entrance Stairs-Number of Steps 2  Bathroom Shower/Tub Programmer, multimedia No  Home Adaptive Equipment None  Prior Function  Level of Independence Independent with basic ADLs;Independent with homemaking with ambulation;Independent with gait;Independent with transfers  Able to Take Stairs? Yes  Driving No  Vocation Retired  Health visitor X4  Bed Mobility  Bed Mobility Yes  Supine to Sit 3: Mod assist  Supine to Sit Details (indicate cue type and reason) cues for sequencing and use of UEs  Sitting - Scoot to [place] of Bed 4: Min assist  Transfers  Transfers Yes  Sit to Stand 4: Min assist;With upper extremity assist;From bed  Sit to Stand Details (indicate cue type and reason) cues for UE use, positioning of LEs.    Stand to Sit 4: Min assist;With upper extremity assist;With armrests;To chair/3-in-1  Stand to Sit Details cues to use armrests, control descent  Ambulation/Gait  Ambulation/Gait Yes  Ambulation/Gait Assistance 4: Min assist  Ambulation/Gait Assistance Details (indicate cue type and reason) cues for use of RW, upright posture, sequencing  Ambulation Distance (Feet) 15 Feet  Assistive device Rolling walker  Gait Pattern Step-to pattern;Decreased step  length - left;Decreased stance time - right;Trunk flexed  Stairs No  Wheelchair Mobility  Wheelchair Mobility No  Posture/Postural Control  Posture/Postural Control No significant limitations  Balance  Balance Assessed No  RLE Assessment  RLE Assessment X  RLE Strength  RLE Overall Strength Comments Strength <3/5 secondary to pain  LLE Assessment  LLE Assessment [facility]  Exercises  Exercises General Lower Extremity  General Exercises - Lower Extremity  Ankle Circles/Pumps AROM;Both;10 reps  Quad Sets AROM;Both;10 reps  PT - End of Session  Equipment Utilized During Treatment Gait belt  Activity Tolerance Patient tolerated treatment well  Patient left in chair;with call bell in reach  Nurse Communication Mobility status for transfers;Mobility status for ambulation  General  Behavior During Session [facility] for tasks performed  Cognition [facility] for tasks performed  PT Assessment  Clinical Impression Statement pt presents s/p fall with femur fx s/p hip pinning.  pt very motivated, but notes family can only check on her and are unable to provide much A.  pt would benefit ST-SNF at D/C.    PT Recommendation/Assessment Patient will need skilled PT in the acute care venue  PT Problem List Decreased strength;Decreased activity tolerance;Decreased balance;Decreased mobility;Decreased knowledge of use of DME;Decreased knowledge of precautions;Pain  Barriers to Discharge Decreased caregiver support  PT Therapy Diagnosis  Difficulty walking;Acute pain  PT Plan  PT Frequency Min 5X/week  PT Treatment/Interventions DME instruction;Gait training;Stair training;Functional mobility training;Therapeutic activities;Therapeutic exercise;Balance training;Patient/family education  PT Recommendation  Recommendations for Other Services OT consult  Follow Up Recommendations Skilled nursing facility  Equipment Recommended Defer to next venue  Individuals Consulted  Consulted and Agree with Results and  Recommendations Patient  Acute Rehab PT Goals  PT Goal Formulation With patient  Time For Goal Achievement 2 weeks  Pt will go Supine/Side to Sit with modified independence  PT Goal: Supine/Side to Sit - Progress Goal set today  Pt will go Sit to Supine/Side with modified independence  PT Goal: Sit to Supine/Side - Progress Goal set today  Pt will go Sit to Stand with modified independence  PT Goal: Sit to Stand - Progress Goal set today  Pt will Ambulate >150 feet;with modified independence;with rolling walker  PT Goal: Ambulate - Progress Goal set today  Pt will Go Up / Down Stairs 1-2 stairs;with min assist;with least restrictive assistive device  PT Goal: Up/Down Stairs - Progress Goal set today  Pt will Perform Home Exercise Program Independently  PT Goal: Perform Home Exercise Program - Progress Goal set today    Mack Hook, PT [phone]

## 2011-10-13 NOTE — Progress Notes (Signed)
ANTICOAGULATION CONSULT NOTE - Initial Consult  Pharmacy Consult for Coumadin Indication: VTE prophylaxis  No Known Allergies  Vital Signs: Temp: 97.7 F (36.5 C) (03/01 0045) BP: 143/76 mmHg (03/01 0045) Pulse Rate: 62  (03/01 0045)  Labs:  Basename 10/12/11 1200  HGB 16.2*  HCT 48.9*  PLT 515*  APTT --  LABPROT 15.6*  INR 1.21  HEPARINUNFRC --  CREATININE 0.85  CKTOTAL --  CKMB --  TROPONINI --   Medical History: Past Medical History  Diagnosis Date  . Arthritis   . Glaucoma   . Hypertension   . Fracture 10/05/11    "fell and broke right hip"  . Fracture of femoral neck, right 10/12/2011    Medications:  Prescriptions prior to admission  Medication Sig Dispense Refill  . amLODipine (NORVASC) 10 MG tablet Take 10 mg by mouth daily.      Marland Kitchen aspirin EC 81 MG tablet Take 81 mg by mouth daily.      . benazepril (LOTENSIN) 40 MG tablet Take 40 mg by mouth daily.      . hydroxyurea (HYDREA) 500 MG capsule Take 500 mg by mouth 2 (two) times daily. May take with food to minimize GI side effects.       Scheduled:    . sodium chloride   Intravenous Once  .  ceFAZolin (ANCEF) IV  1 g Intravenous 60 min Pre-Op  .  ceFAZolin (ANCEF) IV  1 g Intravenous Q6H  . docusate sodium  100 mg Oral BID  . enoxaparin  40 mg Subcutaneous Q24H  . fentaNYL  50 mcg Intravenous Once  .  HYDROmorphone (DILAUDID) injection  1 mg Intravenous Once  . ondansetron (ZOFRAN) IV  4 mg Intravenous Once  . senna  1 tablet Oral BID  . DISCONTD: amLODipine  10 mg Oral Daily  . DISCONTD: aspirin EC  81 mg Oral Daily  . DISCONTD: benazepril  40 mg Oral Daily  . DISCONTD: hydroxyurea  500 mg Oral BID    Assessment: 70yo female to begin Coumadin for VTE Px s/p cannulated hip pinning for femoral neck fracture.  Goal of Therapy:  INR 2-3   Plan:  Will give initial Coumadin dose of 5mg  x1 at 1800 tonight and adjust doses per daily INR.  Colleen Can PharmD BCPS 10/13/2011,1:21 AM

## 2011-10-14 LAB — PROTIME-INR: INR: 1.42 (ref 0.00–1.49)

## 2011-10-14 MED ORDER — WARFARIN SODIUM 5 MG PO TABS
5.0000 mg | ORAL_TABLET | Freq: Once | ORAL | Status: AC
  Administered 2011-10-14: 5 mg via ORAL
  Filled 2011-10-14: qty 1

## 2011-10-14 NOTE — Progress Notes (Signed)
Physical Therapy Treatment Patient Details Name: Alejandra [name] MRN: [id number] DOB: 1941-03-27 Today's Date: 10/14/2011  PT Assessment/Plan  PT - Assessment/Plan Comments on Treatment Session: Pt with increased gait distance today. Pt reporting increased pain RLE with mobility. PT Plan: Discharge plan remains appropriate PT Frequency: Min 5X/week Follow Up Recommendations: Skilled nursing facility Equipment Recommended: Defer to next venue PT Goals  Acute Rehab PT Goals PT Goal: Supine/Side to Sit - Progress: Progressing toward goal PT Goal: Sit to Supine/Side - Progress: Progressing toward goal PT Goal: Sit to Stand - Progress: Progressing toward goal PT Goal: Ambulate - Progress: Progressing toward goal PT Goal: Up/Down Stairs - Progress: Progressing toward goal PT Goal: Perform Home Exercise Program - Progress: Progressing toward goal  PT Treatment Precautions/Restrictions  Precautions Precautions: Fall Restrictions Weight Bearing Restrictions: Yes RLE Weight Bearing: Weight bearing as tolerated Mobility (including Balance) Bed Mobility Supine to Sit: 4: Min assist;HOB flat Supine to Sit Details (indicate cue type and reason): verbal cues for sequencing Transfers Sit to Stand: 4: Min assist;With upper extremity assist;From bed Sit to Stand Details (indicate cue type and reason): verbal cues for hand placement Stand to Sit: 4: Min assist;To chair/3-in-1;With upper extremity assist Stand to Sit Details: assist to control descent, verbal cues for sequencing Ambulation/Gait Ambulation/Gait Assistance: 4: Min assist Ambulation/Gait Assistance Details (indicate cue type and reason): verbal cues for sequencing, RW management Ambulation Distance (Feet): 25 Feet Assistive device: Rolling walker Gait Pattern: Step-to pattern;Decreased stance time - right    Exercise    End of Session PT - End of Session Equipment Utilized During Treatment: Gait belt Activity Tolerance: Patient  tolerated treatment well Patient left: in chair;with call bell in reach General Behavior During Session: [facility] for tasks performed Cognition: [facility] for tasks performed  Ilda Foil 10/14/2011, 12:50 PM  Aida Raider, PT  Office # [phone] Pager [phone]

## 2011-10-14 NOTE — Progress Notes (Signed)
Subjective: 2 Days Post-Op Procedure(s) (LRB): CANNULATED HIP PINNING (Right) Patient reports pain as minimal; walked to doorway yesterday  Objective: Current Vitals Blood pressure 165/90, pulse 71, temperature 98.3 F (36.8 C), temperature source Oral, resp. rate 20, SpO2 100.00%. Vital signs in last 24 hours: Temp:  [98.3 F (36.8 C)-99.9 F (37.7 C)] 98.3 F (36.8 C) (03/02 0615) Pulse Rate:  [67-71] 71  (03/02 0615) Resp:  [17-20] 20  (03/02 0615) BP: (123-165)/(81-99) 165/90 mmHg (03/02 0615) SpO2:  [97 %-100 %] 100 % (03/02 0615)  Intake/Output from previous day: 03/01 0701 - 03/02 0700 In: 2106.3 [P.O.:360; I.V.:1646.3; IV Piggyback:100] Out: 500 [Urine:500]  LABS  Basename 10/13/11 0700 10/12/11 1200  HGB 13.5 16.2*    Basename 10/13/11 0700 10/12/11 1200  WBC 8.2 10.2  RBC 5.09 5.68*  HCT 45.0 48.9*  PLT 431* 515*    Basename 10/13/11 0700 10/12/11 1200  NA 143 142  K 4.2 4.4  CL 107 104  CO2 26 26  BUN 12 23  CREATININE 0.88 0.85  GLUCOSE 79 93  CALCIUM 9.5 11.0*    Basename 10/14/11 0600 10/13/11 0700  LABPT -- --  INR 1.42 1.35    Physical Exam Appears well, comfortable post one pain pill this am and none overnight RLE: scant drainage, intact DPN, SPN, TN sens and motor; DP 2+ bilat, no significant edema bilaterally   Imaging Dg Chest 2 View  10/12/2011  *RADIOLOGY REPORT*  Clinical Data: Preoperative assessment, hip fracture  CHEST - 2 VIEW  Comparison: None  Findings: Enlargement of cardiac silhouette. Mediastinal contours and pulmonary vascularity normal. Lungs clear. No pleural effusion or pneumothorax. End plate spur formation thoracic spine. Osseous demineralization. Probable bilateral chronic rotator cuff tears.  IMPRESSION: Enlargement of cardiac silhouette. No acute abnormalities.  Original Report Authenticated By: Lollie Marrow, M.D.   Dg Hip Operative Right  10/13/2011  *RADIOLOGY REPORT*  Clinical Data: Right hip pinning.   OPERATIVE RIGHT HIP  Comparison: None.  Findings: Three proximal femoral screws noted through the femoral neck into the femoral head.  Normal alignment.  No complicating feature.  IMPRESSION: Right hip pinning.  No complicating feature.  Original Report Authenticated By: Cyndie Chime, M.D.   Dg Pelvis Portable  10/13/2011  *RADIOLOGY REPORT*  Clinical Data: Postop hip pinning.  PORTABLE PELVIS  Comparison: 10/12/2011  Findings: Three screws are noted within the right femoral head and neck.  No hardware complicating feature.  Mild degenerative changes in the hips bilaterally.  IMPRESSION: Pinning of the right femoral neck.  Original Report Authenticated By: Cyndie Chime, M.D.    Assessment/Plan: 2 Days Post-Op Procedure(s) (LRB): CANNULATED HIP PINNING (Right)  WBAT Lovenox D/c planning Drsg change

## 2011-10-14 NOTE — Progress Notes (Addendum)
CSW received consult for SNF. Met with pt who is agreeable to [facility] search and prefers [place] or [place] as they are close to her home. Pt with [employer] which requires prior auth for SNF before d/c. Will f/u with offers. See chart for full eval and FL2. Dellie Burns, MSW, [place] [phone] (weekend)

## 2011-10-14 NOTE — Progress Notes (Signed)
ANTICOAGULATION CONSULT NOTE - Initial Consult  Pharmacy Consult for Coumadin Indication: VTE prophylaxis  No Known Allergies  Vital Signs: Temp: 98.3 F (36.8 C) (03/02 0615) BP: 165/90 mmHg (03/02 0615) Pulse Rate: 71  (03/02 0615)  Labs:  Basename 10/14/11 0600 10/13/11 0700 10/12/11 1200  HGB -- 13.5 16.2*  HCT -- 45.0 48.9*  PLT -- 431* 515*  APTT -- -- --  LABPROT 17.6* 16.9* 15.6*  INR 1.42 1.35 1.21  HEPARINUNFRC -- -- --  CREATININE -- 0.88 0.85  CKTOTAL -- -- --  CKMB -- -- --  TROPONINI -- -- --   Medical History: Past Medical History  Diagnosis Date  . Arthritis   . Glaucoma   . Hypertension   . Fracture 10/05/11    "fell and broke right hip"  . Fracture of femoral neck, right 10/12/2011    Medications:  Prescriptions prior to admission  Medication Sig Dispense Refill  . amLODipine (NORVASC) 10 MG tablet Take 10 mg by mouth daily.      Marland Kitchen aspirin EC 81 MG tablet Take 81 mg by mouth daily.      . benazepril (LOTENSIN) 40 MG tablet Take 40 mg by mouth daily.      . hydroxyurea (HYDREA) 500 MG capsule Take 500 mg by mouth 2 (two) times daily. May take with food to minimize GI side effects.       Scheduled:     .  ceFAZolin (ANCEF) IV  1 g Intravenous Q6H  . docusate sodium  100 mg Oral BID  . enoxaparin  40 mg Subcutaneous Q24H  . senna  1 tablet Oral BID  . warfarin  5 mg Oral ONCE-1800  . Warfarin - Pharmacist Dosing Inpatient   Does not apply q1800    Assessment: 70yo female to begin Coumadin for VTE Px s/p cannulated hip pinning for femoral neck fracture. INR 1.42 after 1st dose of 5mg  coumadin Goal of Therapy:  INR 2-3   Plan:  Coumadin dose of 5mg  x1 at 1800 tonight adjust doses per daily INR.  Lucille Passy PharmD BCPS 10/14/2011,12:37 PM

## 2011-10-15 LAB — PROTIME-INR: Prothrombin Time: 19.7 seconds — ABNORMAL HIGH (ref 11.6–15.2)

## 2011-10-15 MED ORDER — BISACODYL 10 MG RE SUPP
10.0000 mg | Freq: Every day | RECTAL | Status: DC | PRN
  Filled 2011-10-15: qty 1

## 2011-10-15 MED ORDER — WARFARIN SODIUM 5 MG PO TABS
5.0000 mg | ORAL_TABLET | Freq: Once | ORAL | Status: AC
  Administered 2011-10-15: 5 mg via ORAL
  Filled 2011-10-15: qty 1

## 2011-10-15 MED ORDER — ASPIRIN EC 81 MG PO TBEC
81.0000 mg | DELAYED_RELEASE_TABLET | Freq: Every day | ORAL | Status: DC
  Administered 2011-10-15 – 2011-10-17 (×3): 81 mg via ORAL
  Filled 2011-10-15 (×3): qty 1

## 2011-10-15 MED ORDER — AMLODIPINE BESYLATE 10 MG PO TABS
10.0000 mg | ORAL_TABLET | Freq: Every day | ORAL | Status: DC
  Administered 2011-10-15 – 2011-10-17 (×3): 10 mg via ORAL
  Filled 2011-10-15 (×3): qty 1

## 2011-10-15 MED ORDER — HYDROXYUREA 500 MG PO CAPS
500.0000 mg | ORAL_CAPSULE | Freq: Two times a day (BID) | ORAL | Status: DC
  Administered 2011-10-15 – 2011-10-17 (×5): 500 mg via ORAL
  Filled 2011-10-15 (×7): qty 1

## 2011-10-15 MED ORDER — BENAZEPRIL HCL 40 MG PO TABS
40.0000 mg | ORAL_TABLET | Freq: Every day | ORAL | Status: DC
Start: 2011-10-15 — End: 2011-10-17
  Administered 2011-10-15 – 2011-10-17 (×3): 40 mg via ORAL
  Filled 2011-10-15 (×3): qty 1

## 2011-10-15 NOTE — Progress Notes (Signed)
Orthopedic Tech Progress Note Patient Details:  Alejandra [name] Apr 16, 1941 [id number] Trapeze bar [name], female   DOB: 1941/05/26, 71 y.o.   MRN: [id number]   Alejandra [name] 10/15/2011, 2:00 PM

## 2011-10-15 NOTE — Progress Notes (Signed)
ANTICOAGULATION CONSULT NOTE - Initial Consult  Pharmacy Consult for Coumadin Indication: VTE prophylaxis  No Known Allergies  Vital Signs: Temp: 98 F (36.7 C) (03/03 0612) BP: 149/86 mmHg (03/03 0612) Pulse Rate: 63  (03/03 0612)  Labs:  Basename 10/15/11 0545 10/14/11 0600 10/13/11 0700  HGB -- -- 13.5  HCT -- -- 45.0  PLT -- -- 431*  APTT -- -- --  LABPROT 19.7* 17.6* 16.9*  INR 1.64* 1.42 1.35  HEPARINUNFRC -- -- --  CREATININE -- -- 0.88  CKTOTAL -- -- --  CKMB -- -- --  TROPONINI -- -- --   Medical History: Past Medical History  Diagnosis Date  . Arthritis   . Glaucoma   . Hypertension   . Fracture 10/05/11    "fell and broke right hip"  . Fracture of femoral neck, right 10/12/2011    Medications:  Prescriptions prior to admission  Medication Sig Dispense Refill  . amLODipine (NORVASC) 10 MG tablet Take 10 mg by mouth daily.      Marland Kitchen aspirin EC 81 MG tablet Take 81 mg by mouth daily.      . benazepril (LOTENSIN) 40 MG tablet Take 40 mg by mouth daily.      . hydroxyurea (HYDREA) 500 MG capsule Take 500 mg by mouth 2 (two) times daily. May take with food to minimize GI side effects.       Scheduled:     . amLODipine  10 mg Oral Daily  . aspirin EC  81 mg Oral Daily  . benazepril  40 mg Oral Daily  . docusate sodium  100 mg Oral BID  . enoxaparin  40 mg Subcutaneous Q24H  . hydroxyurea  500 mg Oral BID WC  . senna  1 tablet Oral BID  . warfarin  5 mg Oral ONCE-1800  . Warfarin - Pharmacist Dosing Inpatient   Does not apply q1800    Assessment: 71yo female to begin Coumadin for VTE Px  s/p cannulated hip pinning for femoral neck fracture.  INR 164after 2nd dose of 5mg  coumadin Goal of Therapy:  INR 2-3   Plan:  Coumadin dose of 5mg  x1 at 1800 tonight adjust doses per daily INR.  Alejandra [name] PharmD BCPS 10/15/2011,1:11 PM

## 2011-10-15 NOTE — Progress Notes (Signed)
Subjective: 3 Days Post-Op Procedure(s) (LRB): CANNULATED HIP PINNING (Right) Patient reports pain as 1 on 0-10 scale.   Went to bathroom on her own  Objective: Current Vitals Blood pressure 149/86, pulse 63, temperature 98 F (36.7 C), temperature source Oral, resp. rate 18, SpO2 98.00%. Vital signs in last 24 hours: Temp:  [98 F (36.7 C)-98.3 F (36.8 C)] 98 F (36.7 C) (03/03 0612) Pulse Rate:  [63-79] 63  (03/03 0612) Resp:  [18] 18  (03/03 0612) BP: (129-168)/(80-98) 149/86 mmHg (03/03 0612) SpO2:  [98 %-100 %] 98 % (03/03 0612)  Intake/Output from previous day: 03/02 0701 - 03/03 0700 In: 720 [P.O.:720] Out: -   LABS  Basename 10/13/11 0700 10/12/11 1200  HGB 13.5 16.2*    Basename 10/13/11 0700 10/12/11 1200  WBC 8.2 10.2  RBC 5.09 5.68*  HCT 45.0 48.9*  PLT 431* 515*    Basename 10/13/11 0700 10/12/11 1200  NA 143 142  K 4.2 4.4  CL 107 104  CO2 26 26  BUN 12 23  CREATININE 0.88 0.85  GLUCOSE 79 93  CALCIUM 9.5 11.0*    Basename 10/15/11 0545 10/14/11 0600  LABPT -- --  INR 1.64* 1.42    Physical Exam:  Doing well this am No wheezing RLE wound pristine, drsg d/c'd; intact sens and motor without significant edema  Imaging No results found.  Assessment/Plan: 3 Days Post-Op Procedure(s) (LRB): CANNULATED HIP PINNING (Right)  Progressing appropriately but inadequate help at home; will need SNF or rehab; FL-2 is complete  Continue WBAT Coumadin    10/15/2011, 8:49 AM

## 2011-10-16 LAB — PROTIME-INR
INR: 2.26 — ABNORMAL HIGH (ref 0.00–1.49)
Prothrombin Time: 25.3 seconds — ABNORMAL HIGH (ref 11.6–15.2)

## 2011-10-16 MED ORDER — WARFARIN VIDEO
Freq: Once | Status: AC
  Administered 2011-10-16: 19:00:00

## 2011-10-16 MED ORDER — WARFARIN SODIUM 1 MG PO TABS
1.0000 mg | ORAL_TABLET | Freq: Once | ORAL | Status: AC
  Administered 2011-10-16: 1 mg via ORAL
  Filled 2011-10-16: qty 1

## 2011-10-16 MED ORDER — COUMADIN BOOK
Freq: Once | Status: AC
  Administered 2011-10-16: 19:00:00
  Filled 2011-10-16: qty 1

## 2011-10-16 NOTE — Progress Notes (Signed)
Clinical Social Work-CSW attempting [employer] Authorization for pt to d/c to SNF- CSW faxed all appropriate documentation to SNF and as of 1629 they are still in process. CSW will f/u in AM-Ardyth Kelso-MSW, [phone]

## 2011-10-16 NOTE — Progress Notes (Signed)
Physical Therapy Note   10/16/11 1000  PT Visit Information  Last PT Received On 10/16/11  Precautions  Precautions Fall  Restrictions  Weight Bearing Restrictions Yes  RLE Weight Bearing WBAT  Bed Mobility  Bed Mobility Yes  Supine to Sit 4: Min assist;HOB flat  Sitting - Scoot to [place] of Bed 4: Min assist  Transfers  Transfers Yes  Sit to Stand 4: Min assist;With upper extremity assist;From bed  Sit to Stand Details (indicate cue type and reason) cues for hand placement  Stand to Sit 4: Min assist;With upper extremity assist;With armrests;To chair/3-in-1  Stand to Sit Details cues to use armrests  Ambulation/Gait  Ambulation/Gait Yes  Ambulation/Gait Assistance 4: Min assist  Ambulation/Gait Assistance Details (indicate cue type and reason) cues for sequencing, positioning in RW, upright posture  Ambulation Distance (Feet) 100 Feet  Assistive device Rolling walker  Gait Pattern Step-to pattern;Decreased stance time - right  Stairs No  Wheelchair Mobility  Wheelchair Mobility No  Posture/Postural Control  Posture/Postural Control No significant limitations  Balance  Balance Assessed No  General Exercises - Lower Extremity  Long Arc Quad AROM;Both;15 reps  Hip ABduction/ADduction AROM;Right;10 reps  Hip Flexion/Marching AROM;Both;10 reps  PT - End of Session  Equipment Utilized During Treatment Gait belt  Activity Tolerance Patient tolerated treatment well  Patient left in chair;with call bell in reach  Nurse Communication Mobility status for transfers;Mobility status for ambulation  General  Behavior During Session [facility] for tasks performed  Cognition [facility] for tasks performed  PT - Assessment/Plan  Comments on Treatment Session pt moving well and motivated to D/C to rehab facility.    PT Plan Discharge plan remains appropriate  PT Frequency Min 5X/week  Follow Up Recommendations Skilled nursing facility  Equipment Recommended Defer to next venue  Acute Rehab PT Goals   PT Goal: Supine/Side to Sit - Progress Progressing toward goal  PT Goal: Sit to Stand - Progress Progressing toward goal  PT Goal: Ambulate - Progress Progressing toward goal  PT Goal: Perform Home Exercise Program - Progress Progressing toward goal    Mack Hook, PT [phone]

## 2011-10-16 NOTE — Progress Notes (Signed)
ANTICOAGULATION CONSULT NOTE - Initial Consult  Pharmacy Consult for Coumadin Indication: VTE prophylaxis  No Known Allergies  Vital Signs: Temp: 97.7 F (36.5 C) (03/04 0629) BP: 150/95 mmHg (03/04 0629) Pulse Rate: 78  (03/04 0629)  Labs:  Basename 10/16/11 0622 10/15/11 0545 10/14/11 0600  HGB -- -- --  HCT -- -- --  PLT -- -- --  APTT -- -- --  LABPROT 25.3* 19.7* 17.6*  INR 2.26* 1.64* 1.42  HEPARINUNFRC -- -- --  CREATININE -- -- --  CKTOTAL -- -- --  CKMB -- -- --  TROPONINI -- -- --   Medical History: Past Medical History  Diagnosis Date  . Arthritis   . Glaucoma   . Hypertension   . Fracture 10/05/11    "fell and broke right hip"  . Fracture of femoral neck, right 10/12/2011    Medications:  Prescriptions prior to admission  Medication Sig Dispense Refill  . amLODipine (NORVASC) 10 MG tablet Take 10 mg by mouth daily.      Marland Kitchen aspirin EC 81 MG tablet Take 81 mg by mouth daily.      . benazepril (LOTENSIN) 40 MG tablet Take 40 mg by mouth daily.      . hydroxyurea (HYDREA) 500 MG capsule Take 500 mg by mouth 2 (two) times daily. May take with food to minimize GI side effects.       Scheduled:     . amLODipine  10 mg Oral Daily  . aspirin EC  81 mg Oral Daily  . benazepril  40 mg Oral Daily  . coumadin book   Does not apply Once  . docusate sodium  100 mg Oral BID  . enoxaparin  40 mg Subcutaneous Q24H  . hydroxyurea  500 mg Oral BID WC  . senna  1 tablet Oral BID  . warfarin  5 mg Oral ONCE-1800  . warfarin   Does not apply Once  . Warfarin - Pharmacist Dosing Inpatient   Does not apply q1800    Assessment: 70yo female to begin Coumadin for VTE Px s/p cannulated hip pinning for femoral neck fracture. INR jumped up to 2.26.   Card: HTN Max BP 150/95. Meds: Norvasc 10mg /d, ASA 81mg , Benazepril,   Heme/Onc: Hydroxyurea.   Goal of Therapy:  INR 2-3   Plan:  Seeking rehab/SNF options.  D/c Lovenox Coumadin 1mg  po x 1 today.   Pasty Spillers PharmD BCPS 10/16/2011,12:11 PM

## 2011-10-16 NOTE — Progress Notes (Signed)
Subjective: 4 Days Post-Op Procedure(s) (LRB): CANNULATED HIP PINNING (Right) Patient reports pain as mild.   She states that she is doing better and would like to know about going to Rehab.here at the hospital.Talked with her and advised her to mention this to case worker. She denies any CP or SOB Objective: Vital signs in last 24 hours: Temp:  [97.7 F (36.5 C)-98.7 F (37.1 C)] 97.7 F (36.5 C) (03/04 0629) Pulse Rate:  [69-83] 78  (03/04 0629) Resp:  [18-20] 20  (03/04 0629) BP: (133-150)/(86-95) 150/95 mmHg (03/04 0629) SpO2:  [96 %-98 %] 97 % (03/04 0629)  Intake/Output from previous day: 03/03 0701 - 03/04 0700 In: 720 [P.O.:720] Out: -  Intake/Output this shift:    No results found for this basename: HGB:5 in the last 72 hours No results found for this basename: WBC:2,RBC:2,HCT:2,PLT:2 in the last 72 hours No results found for this basename: NA:2,K:2,CL:2,CO2:2,BUN:2,CREATININE:2,GLUCOSE:2,CALCIUM:2 in the last 72 hours  Basename 10/16/11 0622 10/15/11 0545  LABPT -- --  INR 2.26* 1.64*    Sensation intact distally Intact pulses distally Dorsiflexion/Plantar flexion intact Incision: no drainage Soft dressing applied wound clean and dry.  Assessment/Plan: 4 Days Post-Op Procedure(s) (LRB): CANNULATED HIP PINNING (Right) Advance diet Up with therapy Discharge to SNF  [facility], [place] 10/16/2011, 7:55 AM

## 2011-10-16 NOTE — Discharge Summary (Signed)
Physician Discharge Summary  [name] MRN: [id number] DOB/AGE: [birth year] 71 y.o.  Admit date: 10/12/2011 Discharge date: 10/16/2011  Admission Diagnoses:  Fracture of femoral neck, right  Discharge Diagnoses:  Principal Problem:  *Fracture of femoral neck, right   Past Medical History  Diagnosis Date  . Arthritis   . Glaucoma   . Hypertension   . Fracture 10/05/11    "fell and broke right hip"  . Fracture of femoral neck, right 10/12/2011    Surgeries: Procedure(s): CANNULATED HIP PINNING on 10/12/2011 - 10/13/2011   Consultants (if any):    Discharged Condition: Improved  Hospital Course: Alejandra [name] is an 71 y.o. female who was admitted 10/12/2011 with a diagnosis of Fracture of femoral neck, right and went to the operating room on 10/12/2011 - 10/13/2011 and underwent the above named procedures.    She was given perioperative antibiotics:  Anti-infectives     Start     Dose/Rate Route Frequency Ordered Stop   10/13/11 0500   ceFAZolin (ANCEF) IVPB 1 g/50 mL premix        1 g 100 mL/hr over 30 Minutes Intravenous Every 6 hours 10/13/11 0054 10/13/11 1716   10/12/11 1514   ceFAZolin (ANCEF) IVPB 1 g/50 mL premix        1 g 100 mL/hr over 30 Minutes Intravenous 60 min pre-op 10/12/11 1514 10/12/11 2259        .  She was given sequential compression devices, early ambulation, and chemoprophylaxis for DVT prophylaxis.  She benefited maximally from the hospital stay and there were no complications.    Recent vital signs:  Filed Vitals:   10/16/11 0629  BP: 150/95  Pulse: 78  Temp: 97.7 F (36.5 C)  Resp: 20    Recent laboratory studies:  Lab Results  Component Value Date   HGB 13.5 10/13/2011   HGB 16.2* 10/12/2011   HGB 11.7 07/18/2011   Lab Results  Component Value Date   WBC 8.2 10/13/2011   PLT 431* 10/13/2011   Lab Results  Component Value Date   INR 2.26* 10/16/2011   Lab Results  Component Value Date   NA 143 10/13/2011   K 4.2  10/13/2011   CL 107 10/13/2011   CO2 26 10/13/2011   BUN 12 10/13/2011   CREATININE 0.88 10/13/2011   GLUCOSE 79 10/13/2011    Discharge Medications:   Medication List  As of 10/16/2011  8:14 AM   TAKE these medications         amLODipine 10 MG tablet   Commonly known as: NORVASC   Take 10 mg by mouth daily.      aspirin EC 81 MG tablet   Take 81 mg by mouth daily.      benazepril 40 MG tablet   Commonly known as: LOTENSIN   Take 40 mg by mouth daily.      calcium-vitamin D 500-200 MG-UNIT per tablet   Commonly known as: OSCAL WITH D   Take 1 tablet by mouth daily.      HYDROcodone-acetaminophen 10-325 MG per tablet   Commonly known as: NORCO   Take 1 tablet by mouth every 6 (six) hours as needed for pain.      hydroxyurea 500 MG capsule   Commonly known as: HYDREA   Take 500 mg by mouth 2 (two) times daily. May take with food to minimize GI side effects.      warfarin 5 MG tablet   Commonly known  as: COUMADIN   Take 1 tablet (5 mg total) by mouth daily.            Diagnostic Studies: Dg Chest 2 View  10/12/2011  *RADIOLOGY REPORT*  Clinical Data: Preoperative assessment, hip fracture  CHEST - 2 VIEW  Comparison: None  Findings: Enlargement of cardiac silhouette. Mediastinal contours and pulmonary vascularity normal. Lungs clear. No pleural effusion or pneumothorax. End plate spur formation thoracic spine. Osseous demineralization. Probable bilateral chronic rotator cuff tears.  IMPRESSION: Enlargement of cardiac silhouette. No acute abnormalities.  Original Report Authenticated By: Lollie Marrow, M.D.   Dg Hip Operative Right  10/13/2011  *RADIOLOGY REPORT*  Clinical Data: Right hip pinning.  OPERATIVE RIGHT HIP  Comparison: None.  Findings: Three proximal femoral screws noted through the femoral neck into the femoral head.  Normal alignment.  No complicating feature.  IMPRESSION: Right hip pinning.  No complicating feature.  Original Report Authenticated By: Cyndie Chime, M.D.    Dg Pelvis Portable  10/13/2011  *RADIOLOGY REPORT*  Clinical Data: Postop hip pinning.  PORTABLE PELVIS  Comparison: 10/12/2011  Findings: Three screws are noted within the right femoral head and neck.  No hardware complicating feature.  Mild degenerative changes in the hips bilaterally.  IMPRESSION: Pinning of the right femoral neck.  Original Report Authenticated By: Cyndie Chime, M.D.    Disposition: Final discharge disposition not confirmed  Discharge Orders    Future Appointments: Provider: Department: Dept Phone: Center:   10/23/2011 10:30 AM Jarrett Soho [facility] [phone] None   10/23/2011 11:00 AM Lucile Shutters, MD [facility] [phone] None     Future Orders Please Complete By Expires   Diet general      Call MD / Call 911      Comments:   If you experience chest pain or shortness of breath, CALL 911 and be transported to the hospital emergency room.  If you develope a fever above 101 F, pus (white drainage) or increased drainage or redness at the wound, or calf pain, call your surgeon's office.   Constipation Prevention      Comments:   Drink plenty of fluids.  Prune juice may be helpful.  You may use a stool softener, such as Colace (over the counter) 100 mg twice a day.  Use MiraLax (over the counter) for constipation as needed.   Increase activity slowly as tolerated      Weight Bearing as taught in Physical Therapy      Comments:   Use a walker or crutches as instructed.   Discharge wound care:      Comments:   If you have a hip bandage, keep it clean and dry.  Change your bandage as instructed by your health care providers.  If your bandage has been discontinued, keep your incision clean and dry.  Pat dry after bathing.  DO NOT put lotion or powder on your incision.      Follow-up Information    Follow up with Raymundo Rout P, MD in 2 weeks.   Contact information:   [facility] [address], [address] [place] [address] [phone]           Signed: Eulas Post 10/16/2011, 8:14 AM

## 2011-10-17 LAB — PROTIME-INR: Prothrombin Time: 26.3 seconds — ABNORMAL HIGH (ref 11.6–15.2)

## 2011-10-17 MED ORDER — WARFARIN SODIUM 2.5 MG PO TABS
2.5000 mg | ORAL_TABLET | Freq: Once | ORAL | Status: DC
  Filled 2011-10-17: qty 1

## 2011-10-17 NOTE — Progress Notes (Signed)
Subjective: 5 Days Post-Op Procedure(s) (LRB): CANNULATED HIP PINNING (Right) Patient reports pain as mild.   She states that she ready to get to SNF and that she is able to walk reasonably well.  Denies cp or SOB.   Objective: Vital signs in last 24 hours: Temp:  [97.9 F (36.6 C)-98.6 F (37 C)] 97.9 F (36.6 C) (03/05 0626) Pulse Rate:  [67-79] 69  (03/05 0626) Resp:  [16-18] 18  (03/05 0626) BP: (120-137)/(68-86) 137/82 mmHg (03/05 0626) SpO2:  [93 %-100 %] 93 % (03/05 0626)  Intake/Output from previous day: 03/04 0701 - 03/05 0700 In: 240 [P.O.:240] Out: -  Intake/Output this shift:    No results found for this basename: HGB:5 in the last 72 hours No results found for this basename: WBC:2,RBC:2,HCT:2,PLT:2 in the last 72 hours No results found for this basename: NA:2,K:2,CL:2,CO2:2,BUN:2,CREATININE:2,GLUCOSE:2,CALCIUM:2 in the last 72 hours  Basename 10/17/11 0545 10/16/11 0622  LABPT -- --  INR 2.37* 2.26*    Sensation intact distally Intact pulses distally Incision: dressing C/D/I  Assessment/Plan: 5 Days Post-Op Procedure(s) (LRB): CANNULATED HIP PINNING (Right) Advance diet Up with therapy Discharge to SNF  [facility], BRANDON 10/17/2011, 7:35 AM

## 2011-10-17 NOTE — Progress Notes (Signed)
Physical Therapy Note   10/17/11 1400  PT Visit Information  Last PT Received On 10/17/11  Precautions  Precautions Fall  Restrictions  Weight Bearing Restrictions Yes  RLE Weight Bearing WBAT  Bed Mobility  Bed Mobility No  Transfers  Transfers Yes  Sit to Stand 5: Supervision;With upper extremity assist;From chair/3-in-1  Sit to Stand Details (indicate cue type and reason) cues for UE use  Stand to Sit 5: Supervision;With upper extremity assist;With armrests;To chair/3-in-1  Stand to Sit Details cues to get closer to recliner prior to sitting.    Ambulation/Gait  Ambulation/Gait Yes  Ambulation/Gait Assistance 4: Min assist  Ambulation/Gait Assistance Details (indicate cue type and reason) cues for sequencing, upright posture, increase WBing on R LE  Ambulation Distance (Feet) 100 Feet  Assistive device Rolling walker  Gait Pattern Step-to pattern;Decreased stance time - right  Stairs No  Wheelchair Mobility  Wheelchair Mobility No  Posture/Postural Control  Posture/Postural Control No significant limitations  Balance  Balance Assessed No  General Exercises - Lower Extremity  Long Arc Quad AROM;Both;10 reps  Hip Flexion/Marching AROM;Both;10 reps  PT - End of Session  Equipment Utilized During Treatment Gait belt  Activity Tolerance Patient tolerated treatment well  Patient left in chair;with call bell in reach  Nurse Communication Mobility status for transfers;Mobility status for ambulation  General  Behavior During Session [facility] for tasks performed  Cognition [facility] for tasks performed  PT - Assessment/Plan  Comments on Treatment Session pt presents s/p hip fx.  pt continuing to improve mobilty and ready for D/C to SNF Rehab.    PT Plan Discharge plan remains appropriate  PT Frequency Min 5X/week  Follow Up Recommendations Skilled nursing facility  Equipment Recommended Defer to next venue  Acute Rehab PT Goals  PT Goal: Sit to Stand - Progress Progressing toward goal   PT Goal: Ambulate - Progress Progressing toward goal  PT Goal: Perform Home Exercise Program - Progress Progressing toward goal    Mack Hook, PT [phone]

## 2011-10-17 NOTE — Progress Notes (Signed)
Clinical Social Work-CSW continues to attempt to acquire insurance authorization and SNF continues to pursue authorization-at this point CSW has run 3 insurances with SNF and all are inactive-CSW contacted sister who is attempting to locate any insurance information-it is possible that pt will be a letter of guarantee for payment-CSW continuing to follow and will d/c as soon as possible-Alejandra [name]-MSW, [phone]

## 2011-10-17 NOTE — Progress Notes (Signed)
Clinical Social Work-CSW finally confirmed pt [name] chose [name]-and CSW facilitated pt d/c with [facility] transport and d/c packet-Sister aware-No further needs-[name]-MSW, [phone]

## 2011-10-17 NOTE — Progress Notes (Signed)
ANTICOAGULATION CONSULT NOTE - Initial Consult  Pharmacy Consult for Coumadin Indication: VTE prophylaxis  No Known Allergies  Vital Signs: Temp: 97.9 F (36.6 C) (03/05 0626) BP: 137/82 mmHg (03/05 0626) Pulse Rate: 69  (03/05 0626)  Labs:  Alvira Philips 10/17/11 0545 10/16/11 0622 10/15/11 0545  HGB -- -- --  HCT -- -- --  PLT -- -- --  APTT -- -- --  LABPROT 26.3* 25.3* 19.7*  INR 2.37* 2.26* 1.64*  HEPARINUNFRC -- -- --  CREATININE -- -- --  CKTOTAL -- -- --  CKMB -- -- --  TROPONINI -- -- --   Medical History: Past Medical History  Diagnosis Date  . Arthritis   . Glaucoma   . Hypertension   . Fracture 10/05/11    "fell and broke right hip"  . Fracture of femoral neck, right 10/12/2011    Medications:  Prescriptions prior to admission  Medication Sig Dispense Refill  . amLODipine (NORVASC) 10 MG tablet Take 10 mg by mouth daily.      Marland Kitchen aspirin EC 81 MG tablet Take 81 mg by mouth daily.      . benazepril (LOTENSIN) 40 MG tablet Take 40 mg by mouth daily.      . hydroxyurea (HYDREA) 500 MG capsule Take 500 mg by mouth 2 (two) times daily. May take with food to minimize GI side effects.       Scheduled:     . amLODipine  10 mg Oral Daily  . aspirin EC  81 mg Oral Daily  . benazepril  40 mg Oral Daily  . coumadin book   Does not apply Once  . docusate sodium  100 mg Oral BID  . hydroxyurea  500 mg Oral BID WC  . senna  1 tablet Oral BID  . warfarin  1 mg Oral ONCE-1800  . warfarin   Does not apply Once  . Warfarin - Pharmacist Dosing Inpatient   Does not apply q1800  . DISCONTD: enoxaparin  40 mg Subcutaneous Q24H    Assessment: 71yo female to begin Coumadin for VTE Px s/p cannulated hip pinning for femoral neck fracture. INR up to 2.37. No new CBC. Patient will require smaller doses of Coumadin as INR has elevated quickly after 5mg , 5mg , 5mg , 1mg .  Card: HTN Max BP 137/82. Meds: Norvasc 10mg /d, ASA 81mg , Benazepril 40mg /d,   Heme/Onc:  Hydroxyurea.   Goal of Therapy:  INR 2-3   Plan:  Seeking rehab/SNF options.  D/c Lovenox Coumadin 2.5mg  po x 1 today.   Pasty Spillers PharmD BCPS 10/17/2011,8:06 AM

## 2011-10-18 ENCOUNTER — Encounter (HOSPITAL_COMMUNITY): Payer: Self-pay | Admitting: Orthopedic Surgery

## 2011-10-23 ENCOUNTER — Ambulatory Visit: Payer: Medicare PPO | Admitting: Oncology

## 2011-10-23 ENCOUNTER — Other Ambulatory Visit: Payer: Medicare PPO

## 2011-10-24 ENCOUNTER — Telehealth: Payer: Self-pay | Admitting: *Deleted

## 2011-10-24 NOTE — Telephone Encounter (Signed)
patient called in the inform us that she has broken her hip and she will call us back when she is able to come in

## 2011-12-22 ENCOUNTER — Other Ambulatory Visit: Payer: Self-pay | Admitting: *Deleted

## 2011-12-22 DIAGNOSIS — D45 Polycythemia vera: Secondary | ICD-10-CM

## 2011-12-28 ENCOUNTER — Other Ambulatory Visit (HOSPITAL_BASED_OUTPATIENT_CLINIC_OR_DEPARTMENT_OTHER): Payer: Medicare Other | Admitting: Lab

## 2011-12-28 ENCOUNTER — Ambulatory Visit (HOSPITAL_BASED_OUTPATIENT_CLINIC_OR_DEPARTMENT_OTHER): Payer: Medicare PPO | Admitting: Oncology

## 2011-12-28 ENCOUNTER — Telehealth: Payer: Self-pay | Admitting: Oncology

## 2011-12-28 VITALS — BP 154/96 | HR 83 | Temp 97.0°F | Ht 62.0 in | Wt 142.1 lb

## 2011-12-28 DIAGNOSIS — D45 Polycythemia vera: Secondary | ICD-10-CM

## 2011-12-28 DIAGNOSIS — I10 Essential (primary) hypertension: Secondary | ICD-10-CM

## 2011-12-28 LAB — CBC WITH DIFFERENTIAL/PLATELET
BASO%: 2 % (ref 0.0–2.0)
Basophils Absolute: 0.2 10*3/uL — ABNORMAL HIGH (ref 0.0–0.1)
EOS%: 2.7 % (ref 0.0–7.0)
Eosinophils Absolute: 0.2 10*3/uL (ref 0.0–0.5)
HCT: 50.8 % — ABNORMAL HIGH (ref 34.8–46.6)
HGB: 15.7 g/dL (ref 11.6–15.9)
LYMPH%: 14 % (ref 14.0–49.7)
MCH: 24.4 pg — ABNORMAL LOW (ref 25.1–34.0)
MCHC: 30.9 g/dL — ABNORMAL LOW (ref 31.5–36.0)
MCV: 78.9 fL — ABNORMAL LOW (ref 79.5–101.0)
MONO#: 0.4 10*3/uL (ref 0.1–0.9)
MONO%: 5.3 % (ref 0.0–14.0)
NEUT#: 6.2 10*3/uL (ref 1.5–6.5)
NEUT%: 76 % (ref 38.4–76.8)
Platelets: 624 10*3/uL — ABNORMAL HIGH (ref 145–400)
RBC: 6.44 10*6/uL — ABNORMAL HIGH (ref 3.70–5.45)
RDW: 19.7 % — ABNORMAL HIGH (ref 11.2–14.5)
WBC: 8.2 10*3/uL (ref 3.9–10.3)
lymph#: 1.1 10*3/uL (ref 0.9–3.3)
nRBC: 0 % (ref 0–0)

## 2011-12-28 NOTE — Progress Notes (Signed)
   [facility]    OFFICE PROGRESS NOTE   INTERVAL HISTORY:   Alejandra [name] returns for a scheduled visit. She fell and fractured her hip in February of this year. She is recovering from surgery. She missed a scheduled appointment due to surgery. She complains of "arthritis "pain at the right groin.  She reports taking hydroxyurea at a dose of 500 mg daily. She denies symptoms of thromboembolic disease and pruritus.  Objective:  Vital signs in last 24 hours:  Blood pressure 154/96, pulse 83, temperature 97 F (36.1 C), temperature source Oral, height 5\' 2"  (1.575 m), weight 142 lb 1.6 oz (64.456 kg).    HEENT: No thrush or ulcers Resp: Lungs clear bilaterally Cardio: Regular rate and rhythm GI: No hepatosplenomegaly Vascular: No leg edema  musculoskeletal: Healed incision at the right upper thigh. No pain with movement at the right hip.   Lab Results:  Lab Results  Component Value Date   WBC 8.2 12/28/2011   HGB 15.7 12/28/2011   HCT 50.8* 12/28/2011   MCV 78.9* 12/28/2011   PLT 624* 12/28/2011  ANC 6.2  Hemoglobin 11.7, hematocrit 37.1%, platelets 612,000 on 07/18/2011     Medications: I have reviewed the patient's current medications.  Assessment/Plan: 1. Polycythemia vera.  Currently taking hydroxyurea at a dose of 500 mg daily. The hemoglobin/hematocrit are above the goal range.  2. Hypertension, followed by Dr. Shana Chute.   3.    History of hyperpigmentation of the hands, question related to hydroxyurea  4.    hip fracture February 2013   Disposition:  She has polycythemia vera. She has not been taking hydroxyurea at the prescribed dose. The hemoglobin and hematocrit are above the goal range. She will resume hydroxyurea at a dose of 1000 mg daily. She will return for a lab visit in 3 weeks and a 6 week office visit. We will initiate phlebotomy therapy if the hemoglobin/hematocrit do not improve with the increased hydroxyurea dose.   Thornton Papas,  MD  12/28/2011  5:57 PM

## 2011-12-28 NOTE — Telephone Encounter (Signed)
appts made and printed for pt aom °

## 2012-01-18 ENCOUNTER — Other Ambulatory Visit (HOSPITAL_BASED_OUTPATIENT_CLINIC_OR_DEPARTMENT_OTHER): Payer: Medicare Other | Admitting: Lab

## 2012-01-18 DIAGNOSIS — D45 Polycythemia vera: Secondary | ICD-10-CM

## 2012-01-18 LAB — CBC WITH DIFFERENTIAL/PLATELET
BASO%: 2 % (ref 0.0–2.0)
HCT: 52.4 % — ABNORMAL HIGH (ref 34.8–46.6)
HGB: 16.3 g/dL — ABNORMAL HIGH (ref 11.6–15.9)
MCHC: 31.1 g/dL — ABNORMAL LOW (ref 31.5–36.0)
MONO#: 0.5 10*3/uL (ref 0.1–0.9)
NEUT%: 77.1 % — ABNORMAL HIGH (ref 38.4–76.8)
RDW: 20.5 % — ABNORMAL HIGH (ref 11.2–14.5)
WBC: 8.7 10*3/uL (ref 3.9–10.3)
lymph#: 1.1 10*3/uL (ref 0.9–3.3)
nRBC: 0 % (ref 0–0)

## 2012-01-22 ENCOUNTER — Other Ambulatory Visit: Payer: Self-pay | Admitting: *Deleted

## 2012-01-22 ENCOUNTER — Telehealth: Payer: Self-pay | Admitting: *Deleted

## 2012-01-22 NOTE — Telephone Encounter (Signed)
Instructed patient per MD to continue Hydrea 500 mg twice daily and she will need to begin phlebotomy every 2 weeks X 4. POF to scheduler.

## 2012-01-24 ENCOUNTER — Telehealth: Payer: Self-pay | Admitting: *Deleted

## 2012-01-24 NOTE — Telephone Encounter (Signed)
Per staff message I have scheduled appts. JMW  

## 2012-01-25 ENCOUNTER — Telehealth: Payer: Self-pay | Admitting: Oncology

## 2012-01-25 NOTE — Telephone Encounter (Signed)
called  pts home no answers called pts sister Okey Dupre provided appt for 06/14 and to inform pt to pick up june and july scheduled at that time

## 2012-01-26 ENCOUNTER — Ambulatory Visit (HOSPITAL_BASED_OUTPATIENT_CLINIC_OR_DEPARTMENT_OTHER): Payer: Medicare Other

## 2012-01-26 DIAGNOSIS — D45 Polycythemia vera: Secondary | ICD-10-CM

## 2012-01-26 NOTE — Patient Instructions (Signed)
Therapeutic Phlebotomy Therapeutic phlebotomy is the controlled removal of blood from your body for the purpose of treating a medical condition. It is similar to donating blood. Usually, about a pint (470 mL) of blood is removed. The average adult has 9 to 12 pints (4.3 to 5.7 L) of blood. Therapeutic phlebotomy may be used to treat the following medical conditions:  Hemochromatosis. This is a condition in which there is too much iron in the blood.   Polycythemia vera. This is a condition in which there are too many red cells in the blood.   Porphyria cutanea tarda. This is a disease usually passed from one generation to the next (inherited). It is a condition in which an important part of hemoglobin is not made properly. This results in the build up of abnormal amounts of porphyrins in the body.   Sickle cell disease. This is an inherited disease. It is a condition in which the red blood cells form an abnormal crescent shape rather than a round shape.  LET YOUR CAREGIVER KNOW ABOUT:  Allergies.   Medicines taken including herbs, eyedrops, over-the-counter medicines, and creams.   Use of steroids (by mouth or creams).   Previous problems with anesthetics or numbing medicine.   History of blood clots.   History of bleeding or blood problems.   Previous surgery.   Possibility of pregnancy, if this applies.  RISKS AND COMPLICATIONS This is a simple and safe procedure. Problems are unlikely. However, problems can occur and may include:  Nausea or lightheadedness.   Low blood pressure.   Soreness, bleeding, swelling, or bruising at the needle insertion site.   Infection.  BEFORE THE PROCEDURE  This is a procedure that can be done as an outpatient. Confirm the time that you need to arrive for your procedure. Confirm whether there is a need to fast or withhold any medications. It is helpful to wear clothing with sleeves that can be raised above the elbow. A blood sample may be done  to determine the amount of red blood cells or iron in your blood. Plan ahead of time to have someone drive you home after the procedure. PROCEDURE The entire procedure from preparation through recovery takes about 1 hour. The actual collection takes about 10 to 15 minutes.  A needle will be inserted into your vein.   Tubing and a collection bag will be attached to that needle.   Blood will flow through the needle and tubing into the collection bag.   You may be asked to open and close your hand slowly and continuously during the entire collection.   Once the specified amount of blood has been removed from your body, the collection bag and tubing will be clamped.   The needle will be removed.   Pressure will be held on the site of the needle insertion to stop the bleeding. Then a bandage will be placed over the needle insertion site.  AFTER THE PROCEDURE  Your recovery will be assessed and monitored. If there are no problems, as an outpatient, you should be able to go home shortly after the procedure.  Document Released: 01/02/2011 Document Revised: 07/20/2011 Document Reviewed: 01/02/2011 Exit[employer] Patient Information 2012 [employer]. 

## 2012-02-08 ENCOUNTER — Telehealth: Payer: Self-pay | Admitting: Oncology

## 2012-02-08 ENCOUNTER — Ambulatory Visit (HOSPITAL_BASED_OUTPATIENT_CLINIC_OR_DEPARTMENT_OTHER): Payer: Medicare Other | Admitting: Nurse Practitioner

## 2012-02-08 ENCOUNTER — Other Ambulatory Visit: Payer: Medicare Other | Admitting: Lab

## 2012-02-08 VITALS — BP 138/81 | HR 83 | Temp 98.4°F | Ht 62.0 in | Wt 138.0 lb

## 2012-02-08 DIAGNOSIS — D45 Polycythemia vera: Secondary | ICD-10-CM

## 2012-02-08 LAB — COMPREHENSIVE METABOLIC PANEL
Albumin: 4.1 g/dL (ref 3.5–5.2)
BUN: 26 mg/dL — ABNORMAL HIGH (ref 6–23)
CO2: 28 mEq/L (ref 19–32)
Calcium: 10 mg/dL (ref 8.4–10.5)
Chloride: 104 mEq/L (ref 96–112)
Glucose, Bld: 112 mg/dL — ABNORMAL HIGH (ref 70–99)
Potassium: 4.4 mEq/L (ref 3.5–5.3)
Sodium: 141 mEq/L (ref 135–145)
Total Protein: 7.4 g/dL (ref 6.0–8.3)

## 2012-02-08 LAB — CBC WITH DIFFERENTIAL/PLATELET
Basophils Absolute: 0.2 10*3/uL — ABNORMAL HIGH (ref 0.0–0.1)
Eosinophils Absolute: 0.1 10*3/uL (ref 0.0–0.5)
HGB: 13.9 g/dL (ref 11.6–15.9)
MCV: 82.9 fL (ref 79.5–101.0)
MONO#: 0.3 10*3/uL (ref 0.1–0.9)
NEUT#: 4.9 10*3/uL (ref 1.5–6.5)
RBC: 5.46 10*6/uL — ABNORMAL HIGH (ref 3.70–5.45)
RDW: 25.7 % — ABNORMAL HIGH (ref 11.2–14.5)
WBC: 6.4 10*3/uL (ref 3.9–10.3)
lymph#: 0.8 10*3/uL — ABNORMAL LOW (ref 0.9–3.3)

## 2012-02-08 NOTE — Telephone Encounter (Signed)
Gv pt appt for [id number]

## 2012-02-08 NOTE — Progress Notes (Signed)
OFFICE PROGRESS NOTE  Interval history:  Alejandra [name] returns as scheduled. She continues Hydrea 500 mg twice daily. She was phlebotomized on 01/26/2012. She overall is feeling well. She denies nausea/vomiting. No mouth sores. No skin rash. She continues to have "stiffness and soreness" at the right groin upon awakening in the mornings. Symptoms improved with ambulation.   Objective: Blood pressure 138/81, pulse 83, temperature 98.4 F (36.9 C), temperature source Oral, height 5\' 2"  (1.575 m), weight 138 lb (62.596 kg).  Oropharynx is without thrush. Lungs are clear. Regular cardiac rhythm. Abdomen soft and nontender. No organomegaly. Extremities are without edema.  Lab Results: Lab Results  Component Value Date   WBC 6.4 02/08/2012   HGB 13.9 02/08/2012   HCT 45.3 02/08/2012   MCV 82.9 02/08/2012   PLT 536* 02/08/2012    Chemistry:    Chemistry      Component Value Date/Time   NA 143 10/13/2011 0700   K 4.2 10/13/2011 0700   CL 107 10/13/2011 0700   CO2 26 10/13/2011 0700   BUN 12 10/13/2011 0700   CREATININE 0.88 10/13/2011 0700      Component Value Date/Time   CALCIUM 9.5 10/13/2011 0700   ALKPHOS 87 10/12/2011 1200   AST 26 10/12/2011 1200   ALT 25 10/12/2011 1200   BILITOT 0.6 10/12/2011 1200       Studies/Results: No results found.  Medications: I have reviewed the patient's current medications.  Assessment/Plan:  1. Polycythemia vera. She continues hydroxyurea 500 mg twice daily. A phlebotomy program every 2 weeks x4 was initiated on 01/26/2012. 2. Hypertension, followed by Dr. Shana Chute.  3. History of hyperpigmentation at the hands. Question related to hydroxyurea. 4. Hip fracture February 2013.  Disposition-the hematocrit is improved but remains above goal range. Plan to continue Hydrea at the current dose and continue with the phlebotomy program as noted above. She will return for the second phlebotomy 02/09/2012, the third phlebotomy on 02/23/2012 and the final phlebotomy on  03/08/2012. She will return for a followup visit in 8 weeks. She will contact the office in the interim with any problems.  Plan reviewed with Dr. Truett Perna.   Lonna Cobb ANP/GNP-BC

## 2012-02-09 ENCOUNTER — Ambulatory Visit (HOSPITAL_BASED_OUTPATIENT_CLINIC_OR_DEPARTMENT_OTHER): Payer: Medicare Other

## 2012-02-09 ENCOUNTER — Other Ambulatory Visit: Payer: Self-pay | Admitting: Nurse Practitioner

## 2012-02-09 DIAGNOSIS — D45 Polycythemia vera: Secondary | ICD-10-CM

## 2012-02-09 NOTE — Patient Instructions (Signed)
Therapeutic Phlebotomy Therapeutic phlebotomy is the controlled removal of blood from your body for the purpose of treating a medical condition. It is similar to donating blood. Usually, about a pint (470 mL) of blood is removed. The average adult has 9 to 12 pints (4.3 to 5.7 L) of blood. Therapeutic phlebotomy may be used to treat the following medical conditions:  Hemochromatosis. This is a condition in which there is too much iron in the blood.   Polycythemia vera. This is a condition in which there are too many red cells in the blood.   Porphyria cutanea tarda. This is a disease usually passed from one generation to the next (inherited). It is a condition in which an important part of hemoglobin is not made properly. This results in the build up of abnormal amounts of porphyrins in the body.   Sickle cell disease. This is an inherited disease. It is a condition in which the red blood cells form an abnormal crescent shape rather than a round shape.  LET YOUR CAREGIVER KNOW ABOUT:  Allergies.   Medicines taken including herbs, eyedrops, over-the-counter medicines, and creams.   Use of steroids (by mouth or creams).   Previous problems with anesthetics or numbing medicine.   History of blood clots.   History of bleeding or blood problems.   Previous surgery.   Possibility of pregnancy, if this applies.  RISKS AND COMPLICATIONS This is a simple and safe procedure. Problems are unlikely. However, problems can occur and may include:  Nausea or lightheadedness.   Low blood pressure.   Soreness, bleeding, swelling, or bruising at the needle insertion site.   Infection.  BEFORE THE PROCEDURE  This is a procedure that can be done as an outpatient. Confirm the time that you need to arrive for your procedure. Confirm whether there is a need to fast or withhold any medications. It is helpful to wear clothing with sleeves that can be raised above the elbow. A blood sample may be done  to determine the amount of red blood cells or iron in your blood. Plan ahead of time to have someone drive you home after the procedure. PROCEDURE The entire procedure from preparation through recovery takes about 1 hour. The actual collection takes about 10 to 15 minutes.  A needle will be inserted into your vein.   Tubing and a collection bag will be attached to that needle.   Blood will flow through the needle and tubing into the collection bag.   You may be asked to open and close your hand slowly and continuously during the entire collection.   Once the specified amount of blood has been removed from your body, the collection bag and tubing will be clamped.   The needle will be removed.   Pressure will be held on the site of the needle insertion to stop the bleeding. Then a bandage will be placed over the needle insertion site.  AFTER THE PROCEDURE  Your recovery will be assessed and monitored. If there are no problems, as an outpatient, you should be able to go home shortly after the procedure.  Document Released: 01/02/2011 Document Revised: 07/20/2011 Document Reviewed: 01/02/2011 Exit[employer] Patient Information 2012 [employer]. 

## 2012-02-09 NOTE — Progress Notes (Signed)
One unit phlebotomy obtained from left antecubital area without problems.  End bag weight was 550 grams.  Nourishments given, tolerated well.  Site is unremarkable.

## 2012-02-20 ENCOUNTER — Emergency Department (INDEPENDENT_AMBULATORY_CARE_PROVIDER_SITE_OTHER)
Admission: EM | Admit: 2012-02-20 | Discharge: 2012-02-20 | Disposition: A | Discharge status: Home / Self Care | Payer: Medicare Other | Source: Home / Self Care

## 2012-02-20 ENCOUNTER — Encounter (HOSPITAL_COMMUNITY): Payer: Self-pay

## 2012-02-20 DIAGNOSIS — L259 Unspecified contact dermatitis, unspecified cause: Secondary | ICD-10-CM

## 2012-02-20 MED ORDER — PREDNISONE 20 MG PO TABS
ORAL_TABLET | ORAL | Status: AC
  Filled 2012-02-20: qty 3

## 2012-02-20 MED ORDER — PREDNISONE 20 MG PO TABS
60.0000 mg | ORAL_TABLET | Freq: Once | ORAL | Status: AC
  Administered 2012-02-20: 60 mg via ORAL

## 2012-02-20 MED ORDER — PREDNISONE 50 MG PO TABS: ORAL_TABLET | ORAL | Status: AC

## 2012-02-20 NOTE — ED Notes (Signed)
Pt states she worked in her yard 02/16/12, thinks she got into poison oak and since then developed rash, itching and swelling to her face/ around her eyes.  States facial swelling is better.

## 2012-02-20 NOTE — ED Provider Notes (Signed)
Medical screening examination/treatment/procedure(s) were performed by non-physician practitioner and as supervising physician I was immediately available for consultation/collaboration.  Raynald Blend, MD 02/20/12 2055

## 2012-02-20 NOTE — ED Provider Notes (Signed)
History     CSN: [id number]  Arrival date & time 02/20/12  1604   None     Chief Complaint  Patient presents with  . Rash    (Consider location/radiation/quality/duration/timing/severity/associated sxs/prior treatment) Patient is a 71 y.o. female presenting with rash. The history is provided by the patient.  Rash   This patient complains of a pruritic rash.  Location: face and neck  Onset: 5 days ago   Course: improved Self-treated with: Benadryl          Improvement with treatment: partially  History Itching: yes  Tenderness: no  New medications/antibiotics: no  Pet exposure: no  Recent travel or tropical exposure: yes, cutting grass, reports she touched her face  New soaps, shampoos, detergent, clothing: no Tick/insect exposure: no   Red Flags Feeling ill: no Fever:no Facial/tongue swelling/difficulty breathing:  no  Diabetic or immunocompromised: no   Past Medical History  Diagnosis Date  . Arthritis   . Glaucoma   . Hypertension   . Fracture 10/05/11    "fell and broke right hip"  . Fracture of femoral neck, right 10/12/2011    Past Surgical History  Procedure Date  . No past surgeries   . Hip pinning,cannulated 10/12/2011    Procedure: CANNULATED HIP PINNING;  Surgeon: Eulas Post, MD;  Location: [facility] OR;  Service: Orthopedics;  Laterality: Right;    No family history on file.  History  Substance Use Topics  . Smoking status: Former Smoker -- 0.1 packs/day    Types: Cigarettes    Quit date: 08/15/1991  . Smokeless tobacco: Never Used  . Alcohol Use: No     10/12/11 "did drink in my younger; don't drink at all now"    OB History    Grav Para Term Preterm Abortions TAB SAB Ect Mult Living                  Review of Systems  Skin: Positive for rash.  All other systems reviewed and are negative.    Allergies  Review of patient's allergies indicates no known allergies.  Home Medications   Current Outpatient Rx  Name Route Sig  Dispense Refill  . AMLODIPINE BESYLATE 10 MG PO TABS Oral Take 10 mg by mouth daily.    Marland Kitchen BENAZEPRIL HCL 40 MG PO TABS Oral Take 40 mg by mouth daily.    . ACETAMINOPHEN 325 MG PO TABS Oral Take 650 mg by mouth every 6 (six) hours as needed.    . ASPIRIN EC 81 MG PO TBEC Oral Take 81 mg by mouth daily.    Marland Kitchen CALCIUM CARBONATE-VITAMIN D 500-200 MG-UNIT PO TABS Oral Take 1 tablet by mouth daily. 100 tablet 2  . HYDROXYUREA 500 MG PO CAPS Oral Take 500 mg by mouth 2 (two) times daily. May take with food to minimize GI side effects.    Marland Kitchen NAPROXEN SODIUM 220 MG PO CAPS Oral Take 220 mg by mouth as needed.      BP 151/80  Pulse 80  Temp 98 F (36.7 C) (Oral)  Resp 20  SpO2 98%  Physical Exam  Nursing note and vitals reviewed. Constitutional: She is oriented to person, place, and time. Vital signs are normal. She appears well-developed and well-nourished. She is active and cooperative.  HENT:  Head: Normocephalic.  Right Ear: Hearing, tympanic membrane, external ear and ear canal normal.  Left Ear: Hearing, tympanic membrane, external ear and ear canal normal.  Nose: Nose normal.  Mouth/Throat: Uvula  is midline, oropharynx is clear and moist and mucous membranes are normal.  Eyes: Conjunctivae and EOM are normal. Pupils are equal, round, and reactive to light. No scleral icterus.  Neck: Trachea normal and normal range of motion. Neck supple.  Cardiovascular: Normal rate, regular rhythm, normal heart sounds and normal pulses.   Pulmonary/Chest: Effort normal and breath sounds normal.  Lymphadenopathy:    She has no cervical adenopathy.  Neurological: She is alert and oriented to person, place, and time. No cranial nerve deficit or sensory deficit.  Skin: Skin is warm and dry. Rash noted. Rash is urticarial. There is erythema.       Face, anterior/psoterior neck  Psychiatric: She has a normal mood and affect. Her speech is normal and behavior is normal. Judgment and thought content normal.  Cognition and memory are normal.    ED Course  Procedures (including critical care time)  Labs Reviewed - No data to display No results found.   1. Contact dermatitis       MDM  Cool showers; avoid heat, sunlight and anything that makes condition worse.  Continue Benadryl for at least seven days.  Begin prednisone tomorrow-follow instructions.  RTC if symptoms do not improve or begin to have problems swallowing, breathing or significant change in condition.         Johnsie Kindred, NP 02/20/12 1846

## 2012-02-22 ENCOUNTER — Other Ambulatory Visit: Payer: Self-pay | Admitting: Oncology

## 2012-02-23 ENCOUNTER — Ambulatory Visit (HOSPITAL_BASED_OUTPATIENT_CLINIC_OR_DEPARTMENT_OTHER): Payer: Medicare Other

## 2012-02-23 ENCOUNTER — Other Ambulatory Visit (HOSPITAL_BASED_OUTPATIENT_CLINIC_OR_DEPARTMENT_OTHER): Payer: Medicare Other | Admitting: Lab

## 2012-02-23 DIAGNOSIS — D45 Polycythemia vera: Secondary | ICD-10-CM

## 2012-02-23 LAB — CBC WITH DIFFERENTIAL/PLATELET
BASO%: 0.9 % (ref 0.0–2.0)
Basophils Absolute: 0.1 10*3/uL (ref 0.0–0.1)
HCT: 40.1 % (ref 34.8–46.6)
HGB: 12.5 g/dL (ref 11.6–15.9)
MCHC: 31.2 g/dL — ABNORMAL LOW (ref 31.5–36.0)
MONO#: 0.6 10*3/uL (ref 0.1–0.9)
NEUT%: 83.1 % — ABNORMAL HIGH (ref 38.4–76.8)
WBC: 11.7 10*3/uL — ABNORMAL HIGH (ref 3.9–10.3)
lymph#: 1.2 10*3/uL (ref 0.9–3.3)

## 2012-02-23 NOTE — Patient Instructions (Signed)
Therapeutic Phlebotomy Therapeutic phlebotomy is the controlled removal of blood from your body for the purpose of treating a medical condition. It is similar to donating blood. Usually, about a pint (470 mL) of blood is removed. The average adult has 9 to 12 pints (4.3 to 5.7 L) of blood. Therapeutic phlebotomy may be used to treat the following medical conditions:  Hemochromatosis. This is a condition in which there is too much iron in the blood.   Polycythemia vera. This is a condition in which there are too many red cells in the blood.   Porphyria cutanea tarda. This is a disease usually passed from one generation to the next (inherited). It is a condition in which an important part of hemoglobin is not made properly. This results in the build up of abnormal amounts of porphyrins in the body.   Sickle cell disease. This is an inherited disease. It is a condition in which the red blood cells form an abnormal crescent shape rather than a round shape.  LET YOUR CAREGIVER KNOW ABOUT:  Allergies.   Medicines taken including herbs, eyedrops, over-the-counter medicines, and creams.   Use of steroids (by mouth or creams).   Previous problems with anesthetics or numbing medicine.   History of blood clots.   History of bleeding or blood problems.   Previous surgery.   Possibility of pregnancy, if this applies.  RISKS AND COMPLICATIONS This is a simple and safe procedure. Problems are unlikely. However, problems can occur and may include:  Nausea or lightheadedness.   Low blood pressure.   Soreness, bleeding, swelling, or bruising at the needle insertion site.   Infection.  BEFORE THE PROCEDURE  This is a procedure that can be done as an outpatient. Confirm the time that you need to arrive for your procedure. Confirm whether there is a need to fast or withhold any medications. It is helpful to wear clothing with sleeves that can be raised above the elbow. A blood sample may be done  to determine the amount of red blood cells or iron in your blood. Plan ahead of time to have someone drive you home after the procedure. PROCEDURE The entire procedure from preparation through recovery takes about 1 hour. The actual collection takes about 10 to 15 minutes.  A needle will be inserted into your vein.   Tubing and a collection bag will be attached to that needle.   Blood will flow through the needle and tubing into the collection bag.   You may be asked to open and close your hand slowly and continuously during the entire collection.   Once the specified amount of blood has been removed from your body, the collection bag and tubing will be clamped.   The needle will be removed.   Pressure will be held on the site of the needle insertion to stop the bleeding. Then a bandage will be placed over the needle insertion site.  AFTER THE PROCEDURE  Your recovery will be assessed and monitored. If there are no problems, as an outpatient, you should be able to go home shortly after the procedure.  Document Released: 01/02/2011 Document Revised: 07/20/2011 Document Reviewed: 01/02/2011 Exit[employer] Patient Information 2012 [employer]. 

## 2012-03-08 ENCOUNTER — Other Ambulatory Visit (HOSPITAL_BASED_OUTPATIENT_CLINIC_OR_DEPARTMENT_OTHER): Payer: Medicare Other

## 2012-03-08 ENCOUNTER — Ambulatory Visit (HOSPITAL_BASED_OUTPATIENT_CLINIC_OR_DEPARTMENT_OTHER): Payer: Medicare Other

## 2012-03-08 DIAGNOSIS — D45 Polycythemia vera: Secondary | ICD-10-CM

## 2012-03-08 LAB — CBC WITH DIFFERENTIAL/PLATELET
BASO%: 1.1 % (ref 0.0–2.0)
Basophils Absolute: 0.1 10*3/uL (ref 0.0–0.1)
EOS%: 1.7 % (ref 0.0–7.0)
Eosinophils Absolute: 0.1 10*3/uL (ref 0.0–0.5)
HCT: 38.2 % (ref 34.8–46.6)
HGB: 12.1 g/dL (ref 11.6–15.9)
LYMPH%: 11.9 % — ABNORMAL LOW (ref 14.0–49.7)
MCH: 27.5 pg (ref 25.1–34.0)
MCHC: 31.6 g/dL (ref 31.5–36.0)
MCV: 87.2 fL (ref 79.5–101.0)
MONO#: 0.2 10*3/uL (ref 0.1–0.9)
MONO%: 3.3 % (ref 0.0–14.0)
NEUT#: 5.1 10*3/uL (ref 1.5–6.5)
NEUT%: 82 % — ABNORMAL HIGH (ref 38.4–76.8)
Platelets: 376 10*3/uL (ref 145–400)
RBC: 4.38 10*6/uL (ref 3.70–5.45)
RDW: 29.2 % — ABNORMAL HIGH (ref 11.2–14.5)
WBC: 6.2 10*3/uL (ref 3.9–10.3)
lymph#: 0.7 10*3/uL — ABNORMAL LOW (ref 0.9–3.3)

## 2012-03-08 NOTE — Patient Instructions (Signed)

## 2012-03-08 NOTE — Progress Notes (Signed)
Today hgb 12.1 hct. 38.2, lab verified by Dr. Truett Perna, okay to phlebotomize today per MD.  HL

## 2012-04-05 ENCOUNTER — Other Ambulatory Visit (HOSPITAL_BASED_OUTPATIENT_CLINIC_OR_DEPARTMENT_OTHER): Payer: Medicare Other | Admitting: Lab

## 2012-04-05 ENCOUNTER — Ambulatory Visit (HOSPITAL_BASED_OUTPATIENT_CLINIC_OR_DEPARTMENT_OTHER): Payer: Medicare Other | Admitting: Oncology

## 2012-04-05 VITALS — BP 126/78 | HR 74 | Temp 98.1°F | Resp 18 | Ht 62.0 in | Wt 133.8 lb

## 2012-04-05 DIAGNOSIS — M79609 Pain in unspecified limb: Secondary | ICD-10-CM

## 2012-04-05 DIAGNOSIS — L819 Disorder of pigmentation, unspecified: Secondary | ICD-10-CM

## 2012-04-05 DIAGNOSIS — E7800 Pure hypercholesterolemia, unspecified: Secondary | ICD-10-CM

## 2012-04-05 DIAGNOSIS — I10 Essential (primary) hypertension: Secondary | ICD-10-CM

## 2012-04-05 DIAGNOSIS — D45 Polycythemia vera: Secondary | ICD-10-CM

## 2012-04-05 DIAGNOSIS — Z5181 Encounter for therapeutic drug level monitoring: Secondary | ICD-10-CM

## 2012-04-05 LAB — CBC WITH DIFFERENTIAL/PLATELET
Basophils Absolute: 0.1 10*3/uL (ref 0.0–0.1)
Eosinophils Absolute: 0.1 10*3/uL (ref 0.0–0.5)
HCT: 38.1 % (ref 34.8–46.6)
HGB: 12.1 g/dL (ref 11.6–15.9)
LYMPH%: 13 % — ABNORMAL LOW (ref 14.0–49.7)
MCV: 88.4 fL (ref 79.5–101.0)
MONO%: 5.2 % (ref 0.0–14.0)
NEUT#: 5 10*3/uL (ref 1.5–6.5)
NEUT%: 78.7 % — ABNORMAL HIGH (ref 38.4–76.8)
Platelets: 247 10*3/uL (ref 145–400)
RBC: 4.31 10*6/uL (ref 3.70–5.45)

## 2012-04-05 NOTE — Progress Notes (Signed)
   [facility]    OFFICE PROGRESS NOTE   INTERVAL HISTORY:   She returns as scheduled. She complains of discomfort at the right femur. She reports loosening of the surgical scar is was detected by her orthopedic physician. She is being scheduled for surgery.  She continues hydroxyurea at a dose of 1000 g daily. She is tolerating the hydroxyurea well. She completed a series of 4 phlebotomy treatments on 03/08/2012. No symptoms of venous or arterial thrombosis.  Objective:  Vital signs in last 24 hours:  Blood pressure 126/78, pulse 74, temperature 98.1 F (36.7 C), temperature source Oral, resp. rate 18, height 5\' 2"  (1.575 m), weight 133 lb 12.8 oz (60.691 kg).    HEENT: No thrush or ulcers Resp: Lungs clear bilaterally Cardio: Regular rate and rhythm GI: No hepatosplenomegaly, nontender Vascular: No leg edema   Lab Results:  Lab Results  Component Value Date   WBC 6.4 04/05/2012   HGB 12.1 04/05/2012   HCT 38.1 04/05/2012   MCV 88.4 04/05/2012   PLT 247 04/05/2012   ANC 5.0    Medications: I have reviewed the patient's current medications.  Assessment/Plan: 1. Polycythemia vera. She continues hydroxyurea 500 mg twice daily. She completed a series of 4 phlebotomy treatments on 03/08/2012 and the hemoglobin/hematocrit are now in the goal range. 2. Hypertension, followed by Dr. Shana Chute.  3. History of hyperpigmentation at the hands. Question related to hydroxyurea. 4. Hip fracture February 2013. She is in the process of being scheduled for a surgical revision procedure due to "loosening of screws ".   Disposition:  She will continue hydroxyurea. The hemoglobin/hematocrit are in the goal range after completing a series of phlebotomy treatments. She will return for a lab visit in one month. She is scheduled for a 3 month office visit. We will obtain a lipid panel and liver panel as requested by Dr. Sharyn Lull when she returns next month.   Thornton Papas,  MD  04/05/2012  11:57 AM

## 2012-04-12 ENCOUNTER — Encounter (HOSPITAL_COMMUNITY): Payer: Self-pay | Admitting: Pharmacy Technician

## 2012-04-17 ENCOUNTER — Other Ambulatory Visit: Payer: Self-pay | Admitting: Orthopedic Surgery

## 2012-04-18 ENCOUNTER — Encounter (HOSPITAL_COMMUNITY)
Admission: RE | Admit: 2012-04-18 | Discharge: 2012-04-18 | Disposition: A | Discharge status: Home / Self Care | Payer: Medicare Other | Source: Ambulatory Visit | Attending: Orthopedic Surgery | Admitting: Orthopedic Surgery

## 2012-04-18 ENCOUNTER — Encounter (HOSPITAL_COMMUNITY): Payer: Self-pay

## 2012-04-18 HISTORY — DX: Unspecified glaucoma: H40.9

## 2012-04-18 HISTORY — DX: Polycythemia vera: D45

## 2012-04-18 LAB — URINALYSIS, ROUTINE W REFLEX MICROSCOPIC
Glucose, UA: NEGATIVE mg/dL
Leukocytes, UA: NEGATIVE
Specific Gravity, Urine: 1.031 — ABNORMAL HIGH (ref 1.005–1.030)
pH: 5.5 (ref 5.0–8.0)

## 2012-04-18 LAB — CBC
MCH: 29 pg (ref 26.0–34.0)
MCV: 92.6 fL (ref 78.0–100.0)
Platelets: 990 10*3/uL (ref 150–400)
RDW: 19.5 % — ABNORMAL HIGH (ref 11.5–15.5)
WBC: 5.8 10*3/uL (ref 4.0–10.5)

## 2012-04-18 LAB — SURGICAL PCR SCREEN
MRSA, PCR: NEGATIVE
Staphylococcus aureus: NEGATIVE

## 2012-04-18 LAB — BASIC METABOLIC PANEL
CO2: 26 mEq/L (ref 19–32)
Calcium: 10.1 mg/dL (ref 8.4–10.5)
Creatinine, Ser: 0.76 mg/dL (ref 0.50–1.10)
GFR calc non Af Amer: 82 mL/min — ABNORMAL LOW (ref >90)

## 2012-04-18 LAB — PROTIME-INR: Prothrombin Time: 17 seconds — ABNORMAL HIGH (ref 11.6–15.2)

## 2012-04-18 NOTE — Pre-Procedure Instructions (Signed)
20 Alejandra [name]  04/18/2012   Your procedure is scheduled on:  Tuesday April 23, 2012  Report to [facility] at 9:30 AM.  Call this number if you have problems the morning of surgery: [phone]   Remember:   Do not eat food or drink:After Midnight.      Take these medicines the morning of surgery with A SIP OF WATER: amlodipine, cosopt, tramadol   Do not wear jewelry, make-up or nail polish.  Do not wear lotions, powders, or perfumes. You may wear deodorant.  Do not shave 48 hours prior to surgery. Men may shave face and neck.  Do not bring valuables to the hospital.  Contacts, dentures or bridgework may not be worn into surgery.  Leave suitcase in the car. After surgery it may be brought to your room.  For patients admitted to the hospital, checkout time is 11:00 AM the day of discharge.   Patients discharged the day of surgery will not be allowed to drive home.  Name and phone number of your driver: family / driver  Special Instructions: Incentive Spirometry - Practice and bring it with you on the day of surgery. and CHG Shower Use Special Wash: 1/2 bottle night before surgery and 1/2 bottle morning of surgery.   Please read over the following fact sheets that you were given: Pain Booklet, Coughing and Deep Breathing, Blood Transfusion Information, Total Joint Packet, MRSA Information and Surgical Site Infection Prevention

## 2012-04-18 NOTE — Progress Notes (Signed)
Patient denied having a stress test, cardiac cath, or sleep study.  

## 2012-04-18 NOTE — Progress Notes (Signed)
Alejandra [name], [place] returned called and informed Nurse that he would pass the critical lab on to Dr. Dion Saucier.

## 2012-04-18 NOTE — Progress Notes (Signed)
Nurse called Dr. Shelba Flake office for critical platelet results. Staff informed Nurse that Dr Dion Saucier was out of town but she would pass the message on to Zena, [place].

## 2012-04-19 LAB — PATHOLOGIST SMEAR REVIEW

## 2012-04-19 NOTE — Consult Note (Addendum)
Anesthesia chart review: Patient is a 71 year old female scheduled for removal of deep implants and conversion to hemiarthroplasty right hip on 04/23/2012 by Dr. Dion Saucier.  She is s/p right hip pinning for acute fracture on 10/12/11. Other history includes former smoker, hypertension, glaucoma, polycythemia vera, arthritis.  Her Hematologist is Dr. Truett Perna.  Her PCP/Cardiologist is Dr. Shana Chute or Dr. Sharyn Lull.   She just saw Dr. Myrle Sheng on 04/05/12.  Below is his A/P: 1. Polycythemia vera. She continues hydroxyurea 500 mg twice daily. She completed a series of 4 phlebotomy treatments on 03/08/2012 and the hemoglobin/hematocrit are now in the goal range. 2. Hypertension, followed by Dr. Shana Chute.  3. History of hyperpigmentation at the hands. Question related to hydroxyurea. 4. Hip fracture February 2013. She is in the process of being scheduled for a surgical revision procedure due to "loosening of screws ".  EKG on 10/12/11 showed NSR.  CXR report on 10/12/11 showed: Enlargement of cardiac silhouette.  Mediastinal contours and pulmonary vascularity normal.  Lungs clear.  No pleural effusion or pneumothorax.  End plate spur formation thoracic spine.  Osseous demineralization.  Probable bilateral chronic rotator cuff tears.  Labs on 04/18/12 noted.  H/H 11.7/37.3.  PT 17.0/INR 1.36. She had a critical PLT count of 990 that was already called to Dr. Shelba Flake office (up from 247 on 04/05/12) who notified Dr. Sharyn Lull.  I'll recheck her CBC and coags on the day of surgery.  T&S already done.  I spoke with Ms. Penning today.  She said Dr. Sharyn Lull did call and notify her of the results.  She told him that Dr. Truett Perna was already aware of her upcoming surgery, so she said Dr. Sharyn Lull told her to let Dr. Shelba Flake office know that.  I told her that even though she has known significant thrombocytosis in the past, I felt that Dr. Truett Perna would also need to be made aware since there had been a significant increase in  less than a month.  I called to speak with Dr. Truett Perna and was asked to leave a voicemail with his nurse which I did.  I also routed the CBC results to him.  I'll follow-up his recommendations when available.  Shonna Chock, PA-C 04/19/12 1615  Addendum: 04/22/12 1210 I spoke with Amy, Dr. Kalman Drape nurse.  He is aware of patient's PLT results.  No new recommendations at this time.  I notified Anesthesiologist Dr. Jean Rosenthal of CBC results on 04/19/12.

## 2012-04-22 MED ORDER — CEFAZOLIN SODIUM-DEXTROSE 2-3 GM-% IV SOLR
2.0000 g | INTRAVENOUS | Status: DC
  Filled 2012-04-22: qty 50

## 2012-04-23 ENCOUNTER — Inpatient Hospital Stay (HOSPITAL_COMMUNITY): Payer: Medicare Other

## 2012-04-23 ENCOUNTER — Inpatient Hospital Stay (HOSPITAL_COMMUNITY)
Admission: RE | Admit: 2012-04-23 | Discharge: 2012-04-25 | DRG: 470 | Disposition: A | Discharge status: Skilled Nursing Facility | Payer: Medicare Other | Source: Ambulatory Visit | Attending: Orthopedic Surgery | Admitting: Orthopedic Surgery

## 2012-04-23 ENCOUNTER — Inpatient Hospital Stay (HOSPITAL_COMMUNITY): Payer: Medicare Other | Admitting: Vascular Surgery

## 2012-04-23 ENCOUNTER — Encounter (HOSPITAL_COMMUNITY): Payer: Self-pay | Admitting: Vascular Surgery

## 2012-04-23 ENCOUNTER — Encounter (HOSPITAL_COMMUNITY): Payer: Self-pay | Admitting: Orthopedic Surgery

## 2012-04-23 ENCOUNTER — Encounter (HOSPITAL_COMMUNITY)
Admission: RE | Disposition: A | Discharge status: Skilled Nursing Facility | Payer: Self-pay | Source: Ambulatory Visit | Attending: Orthopedic Surgery

## 2012-04-23 DIAGNOSIS — Z79899 Other long term (current) drug therapy: Secondary | ICD-10-CM

## 2012-04-23 DIAGNOSIS — S72009S Fracture of unspecified part of neck of unspecified femur, sequela: Secondary | ICD-10-CM

## 2012-04-23 DIAGNOSIS — I10 Essential (primary) hypertension: Secondary | ICD-10-CM | POA: Diagnosis present

## 2012-04-23 DIAGNOSIS — Z01811 Encounter for preprocedural respiratory examination: Secondary | ICD-10-CM

## 2012-04-23 DIAGNOSIS — M129 Arthropathy, unspecified: Secondary | ICD-10-CM | POA: Diagnosis present

## 2012-04-23 DIAGNOSIS — Z87891 Personal history of nicotine dependence: Secondary | ICD-10-CM

## 2012-04-23 DIAGNOSIS — D75839 Thrombocytosis, unspecified: Secondary | ICD-10-CM

## 2012-04-23 DIAGNOSIS — IMO0002 Reserved for concepts with insufficient information to code with codable children: Principal | ICD-10-CM | POA: Diagnosis present

## 2012-04-23 DIAGNOSIS — Z01812 Encounter for preprocedural laboratory examination: Secondary | ICD-10-CM

## 2012-04-23 DIAGNOSIS — Z7982 Long term (current) use of aspirin: Secondary | ICD-10-CM

## 2012-04-23 DIAGNOSIS — H409 Unspecified glaucoma: Secondary | ICD-10-CM | POA: Diagnosis present

## 2012-04-23 DIAGNOSIS — D45 Polycythemia vera: Secondary | ICD-10-CM | POA: Diagnosis present

## 2012-04-23 DIAGNOSIS — W19XXXS Unspecified fall, sequela: Secondary | ICD-10-CM

## 2012-04-23 DIAGNOSIS — D473 Essential (hemorrhagic) thrombocythemia: Secondary | ICD-10-CM | POA: Diagnosis present

## 2012-04-23 DIAGNOSIS — M87051 Idiopathic aseptic necrosis of right femur: Secondary | ICD-10-CM

## 2012-04-23 HISTORY — DX: Essential (hemorrhagic) thrombocythemia: D47.3

## 2012-04-23 HISTORY — PX: HARDWARE REMOVAL: SHX979

## 2012-04-23 HISTORY — DX: Thrombocytosis, unspecified: D75.839

## 2012-04-23 HISTORY — PX: HIP ARTHROPLASTY: SHX981

## 2012-04-23 HISTORY — DX: Idiopathic aseptic necrosis of right femur: M87.051

## 2012-04-23 LAB — POCT I-STAT 4, (NA,K, GLUC, HGB,HCT)
Glucose, Bld: 109 mg/dL — ABNORMAL HIGH (ref 70–99)
HCT: 29 % — ABNORMAL LOW (ref 36.0–46.0)
Sodium: 140 mEq/L (ref 135–145)

## 2012-04-23 LAB — PROTIME-INR: Prothrombin Time: 17.1 seconds — ABNORMAL HIGH (ref 11.6–15.2)

## 2012-04-23 LAB — CBC
HCT: 33.8 % — ABNORMAL LOW (ref 36.0–46.0)
Hemoglobin: 10.6 g/dL — ABNORMAL LOW (ref 12.0–15.0)
MCH: 29.1 pg (ref 26.0–34.0)
RBC: 3.64 MIL/uL — ABNORMAL LOW (ref 3.87–5.11)

## 2012-04-23 SURGERY — REMOVAL, HARDWARE
Anesthesia: General | Site: Hip | Laterality: Right | Wound class: Clean

## 2012-04-23 MED ORDER — ACETAMINOPHEN 10 MG/ML IV SOLN
1000.0000 mg | Freq: Four times a day (QID) | INTRAVENOUS | Status: AC
  Administered 2012-04-23 – 2012-04-24 (×4): 1000 mg via INTRAVENOUS
  Filled 2012-04-23 (×4): qty 100

## 2012-04-23 MED ORDER — POTASSIUM CHLORIDE IN NACL 20-0.45 MEQ/L-% IV SOLN
INTRAVENOUS | Status: DC
  Administered 2012-04-23: 23:00:00 via INTRAVENOUS
  Administered 2012-04-24: 75 mL/h via INTRAVENOUS
  Administered 2012-04-25: 06:00:00 via INTRAVENOUS
  Filled 2012-04-23 (×5): qty 1000

## 2012-04-23 MED ORDER — MENTHOL 3 MG MT LOZG: 1.0000 | LOZENGE | OROMUCOSAL | Status: DC | PRN

## 2012-04-23 MED ORDER — COUMADIN BOOK
Freq: Once | Status: DC
  Filled 2012-04-23: qty 1

## 2012-04-23 MED ORDER — FENTANYL CITRATE 0.05 MG/ML IJ SOLN
INTRAMUSCULAR | Status: DC | PRN
  Administered 2012-04-23 (×2): 50 ug via INTRAVENOUS
  Administered 2012-04-23: 100 ug via INTRAVENOUS
  Administered 2012-04-23: 50 ug via INTRAVENOUS

## 2012-04-23 MED ORDER — SODIUM CHLORIDE 0.9 % IV SOLN
INTRAVENOUS | Status: DC | PRN
  Administered 2012-04-23: 15:00:00 via INTRAVENOUS

## 2012-04-23 MED ORDER — PROPOFOL 10 MG/ML IV BOLUS
INTRAVENOUS | Status: DC | PRN
  Administered 2012-04-23: 120 mg via INTRAVENOUS

## 2012-04-23 MED ORDER — PROMETHAZINE HCL 25 MG/ML IJ SOLN: 6.2500 mg | INTRAMUSCULAR | Status: DC | PRN

## 2012-04-23 MED ORDER — WARFARIN VIDEO
Freq: Once | Status: AC
  Administered 2012-04-24: 10:00:00

## 2012-04-23 MED ORDER — ENOXAPARIN SODIUM 40 MG/0.4ML ~~LOC~~ SOLN
40.0000 mg | SUBCUTANEOUS | Status: DC
  Administered 2012-04-24 – 2012-04-25 (×2): 40 mg via SUBCUTANEOUS
  Filled 2012-04-23 (×3): qty 0.4

## 2012-04-23 MED ORDER — METOCLOPRAMIDE HCL 10 MG PO TABS: 5.0000 mg | ORAL_TABLET | Freq: Three times a day (TID) | ORAL | Status: DC | PRN

## 2012-04-23 MED ORDER — BENAZEPRIL HCL 40 MG PO TABS
40.0000 mg | ORAL_TABLET | Freq: Every day | ORAL | Status: DC
  Administered 2012-04-23 – 2012-04-25 (×3): 40 mg via ORAL
  Filled 2012-04-23 (×3): qty 1

## 2012-04-23 MED ORDER — MEPERIDINE HCL 25 MG/ML IJ SOLN: 6.2500 mg | INTRAMUSCULAR | Status: DC | PRN

## 2012-04-23 MED ORDER — ALUM & MAG HYDROXIDE-SIMETH 200-200-20 MG/5ML PO SUSP
30.0000 mL | ORAL | Status: DC | PRN
  Administered 2012-04-24: 30 mL via ORAL
  Filled 2012-04-23: qty 30

## 2012-04-23 MED ORDER — DORZOLAMIDE HCL-TIMOLOL MAL 2-0.5 % OP SOLN
1.0000 [drp] | Freq: Two times a day (BID) | OPHTHALMIC | Status: DC
  Administered 2012-04-23 – 2012-04-25 (×4): 1 [drp] via OPHTHALMIC
  Filled 2012-04-23: qty 10

## 2012-04-23 MED ORDER — HYDROMORPHONE HCL PF 1 MG/ML IJ SOLN
INTRAMUSCULAR | Status: AC
  Filled 2012-04-23: qty 1

## 2012-04-23 MED ORDER — ONDANSETRON HCL 4 MG/2ML IJ SOLN
INTRAMUSCULAR | Status: DC | PRN
  Administered 2012-04-23: 4 mg via INTRAVENOUS

## 2012-04-23 MED ORDER — ALPRAZOLAM 0.25 MG PO TABS: 0.2500 mg | ORAL_TABLET | Freq: Two times a day (BID) | ORAL | Status: DC | PRN

## 2012-04-23 MED ORDER — LIDOCAINE HCL (CARDIAC) 20 MG/ML IV SOLN
INTRAVENOUS | Status: DC | PRN
  Administered 2012-04-23: 30 mg via INTRAVENOUS

## 2012-04-23 MED ORDER — HYDROCODONE-ACETAMINOPHEN 5-325 MG PO TABS
1.0000 | ORAL_TABLET | Freq: Four times a day (QID) | ORAL | Status: DC | PRN
  Administered 2012-04-24 – 2012-04-25 (×4): 1 via ORAL
  Administered 2012-04-25 (×2): 2 via ORAL
  Administered 2012-04-25: 1 via ORAL
  Filled 2012-04-23 (×2): qty 1
  Filled 2012-04-23: qty 2
  Filled 2012-04-23 (×2): qty 1
  Filled 2012-04-23: qty 2
  Filled 2012-04-23: qty 1
  Filled 2012-04-23: qty 2

## 2012-04-23 MED ORDER — HYDROCODONE-ACETAMINOPHEN 10-325 MG PO TABS: 1.0000 | ORAL_TABLET | Freq: Four times a day (QID) | ORAL | Status: AC | PRN

## 2012-04-23 MED ORDER — WARFARIN SODIUM 5 MG PO TABS: 5.0000 mg | ORAL_TABLET | Freq: Every day | ORAL | Status: DC

## 2012-04-23 MED ORDER — DOCUSATE SODIUM 100 MG PO CAPS
100.0000 mg | ORAL_CAPSULE | Freq: Two times a day (BID) | ORAL | Status: DC
  Administered 2012-04-23 – 2012-04-25 (×4): 100 mg via ORAL
  Filled 2012-04-23 (×5): qty 1

## 2012-04-23 MED ORDER — MIDAZOLAM HCL 5 MG/5ML IJ SOLN
INTRAMUSCULAR | Status: DC | PRN
  Administered 2012-04-23: 1 mg via INTRAVENOUS

## 2012-04-23 MED ORDER — WARFARIN SODIUM 5 MG PO TABS
5.0000 mg | ORAL_TABLET | Freq: Once | ORAL | Status: AC
  Administered 2012-04-23: 5 mg via ORAL
  Filled 2012-04-23 (×2): qty 1

## 2012-04-23 MED ORDER — CEFAZOLIN SODIUM-DEXTROSE 2-3 GM-% IV SOLR
2.0000 g | Freq: Four times a day (QID) | INTRAVENOUS | Status: AC
  Administered 2012-04-23 – 2012-04-24 (×2): 2 g via INTRAVENOUS
  Filled 2012-04-23 (×2): qty 50

## 2012-04-23 MED ORDER — SENNA-DOCUSATE SODIUM 8.6-50 MG PO TABS: 1.0000 | ORAL_TABLET | Freq: Every day | ORAL | Status: AC

## 2012-04-23 MED ORDER — LACTATED RINGERS IV SOLN
INTRAVENOUS | Status: DC
  Administered 2012-04-23: 12:00:00 via INTRAVENOUS

## 2012-04-23 MED ORDER — BISACODYL 5 MG PO TBEC
5.0000 mg | DELAYED_RELEASE_TABLET | Freq: Every day | ORAL | Status: AC | PRN
Start: 2012-04-23 — End: 2012-05-23

## 2012-04-23 MED ORDER — GLYCOPYRROLATE 0.2 MG/ML IJ SOLN
INTRAMUSCULAR | Status: DC | PRN
  Administered 2012-04-23: 0.2 mg via INTRAVENOUS

## 2012-04-23 MED ORDER — WARFARIN - PHARMACIST DOSING INPATIENT
Freq: Every day | Status: DC
  Administered 2012-04-24: 18:00:00

## 2012-04-23 MED ORDER — LACTATED RINGERS IV SOLN
INTRAVENOUS | Status: DC | PRN
  Administered 2012-04-23 (×2): via INTRAVENOUS

## 2012-04-23 MED ORDER — ROCURONIUM BROMIDE 100 MG/10ML IV SOLN
INTRAVENOUS | Status: DC | PRN
  Administered 2012-04-23: 40 mg via INTRAVENOUS

## 2012-04-23 MED ORDER — ACETAMINOPHEN 325 MG PO TABS: 650.0000 mg | ORAL_TABLET | Freq: Four times a day (QID) | ORAL | Status: DC | PRN

## 2012-04-23 MED ORDER — TRAMADOL HCL 50 MG PO TABS: 50.0000 mg | ORAL_TABLET | Freq: Four times a day (QID) | ORAL | Status: DC | PRN

## 2012-04-23 MED ORDER — SORBITOL 70 % SOLN: 30.0000 mL | Freq: Every day | Status: DC | PRN

## 2012-04-23 MED ORDER — PHENOL 1.4 % MT LIQD: 1.0000 | OROMUCOSAL | Status: DC | PRN

## 2012-04-23 MED ORDER — DEXTROSE 5 % IV SOLN
10.0000 mg | INTRAVENOUS | Status: DC | PRN
  Administered 2012-04-23: 25 ug/min via INTRAVENOUS

## 2012-04-23 MED ORDER — POLYETHYLENE GLYCOL 3350 17 G PO PACK
17.0000 g | PACK | Freq: Every day | ORAL | Status: DC | PRN
  Administered 2012-04-25: 17 g via ORAL
  Filled 2012-04-23 (×2): qty 1

## 2012-04-23 MED ORDER — CALCIUM CARBONATE-VITAMIN D 500-200 MG-UNIT PO TABS
1.0000 | ORAL_TABLET | Freq: Every day | ORAL | Status: DC
  Administered 2012-04-23 – 2012-04-25 (×3): 1 via ORAL
  Filled 2012-04-23 (×3): qty 1

## 2012-04-23 MED ORDER — CEFAZOLIN SODIUM 1-5 GM-% IV SOLN
INTRAVENOUS | Status: DC | PRN
  Administered 2012-04-23: 2 g via INTRAVENOUS

## 2012-04-23 MED ORDER — DEXTROSE 5 % IV SOLN
500.0000 mg | Freq: Four times a day (QID) | INTRAVENOUS | Status: DC | PRN
  Administered 2012-04-23: 500 mg via INTRAVENOUS
  Filled 2012-04-23: qty 5

## 2012-04-23 MED ORDER — MORPHINE SULFATE 2 MG/ML IJ SOLN
0.5000 mg | INTRAMUSCULAR | Status: DC | PRN
  Administered 2012-04-24: 0.5 mg via INTRAVENOUS
  Filled 2012-04-23: qty 1

## 2012-04-23 MED ORDER — MIDAZOLAM HCL 2 MG/2ML IJ SOLN: 0.5000 mg | Freq: Once | INTRAMUSCULAR | Status: DC | PRN

## 2012-04-23 MED ORDER — ACETAMINOPHEN 650 MG RE SUPP: 650.0000 mg | Freq: Four times a day (QID) | RECTAL | Status: DC | PRN

## 2012-04-23 MED ORDER — SODIUM CHLORIDE 0.9 % IR SOLN
Status: DC | PRN
  Administered 2012-04-23: 1000 mL

## 2012-04-23 MED ORDER — ASPIRIN EC 81 MG PO TBEC
81.0000 mg | DELAYED_RELEASE_TABLET | Freq: Every day | ORAL | Status: DC
  Administered 2012-04-23 – 2012-04-25 (×3): 81 mg via ORAL
  Filled 2012-04-23 (×3): qty 1

## 2012-04-23 MED ORDER — METHOCARBAMOL 500 MG PO TABS
500.0000 mg | ORAL_TABLET | Freq: Four times a day (QID) | ORAL | Status: DC | PRN
  Administered 2012-04-24: 500 mg via ORAL
  Filled 2012-04-23 (×2): qty 1

## 2012-04-23 MED ORDER — HYDROMORPHONE HCL PF 1 MG/ML IJ SOLN
0.2500 mg | INTRAMUSCULAR | Status: DC | PRN
  Administered 2012-04-23 (×4): 0.5 mg via INTRAVENOUS

## 2012-04-23 MED ORDER — METOCLOPRAMIDE HCL 5 MG/ML IJ SOLN: 5.0000 mg | Freq: Three times a day (TID) | INTRAMUSCULAR | Status: DC | PRN

## 2012-04-23 MED ORDER — NEOSTIGMINE METHYLSULFATE 1 MG/ML IJ SOLN
INTRAMUSCULAR | Status: DC | PRN
  Administered 2012-04-23: 1.5 mg via INTRAVENOUS

## 2012-04-23 MED ORDER — ONDANSETRON HCL 4 MG/2ML IJ SOLN: 4.0000 mg | Freq: Four times a day (QID) | INTRAMUSCULAR | Status: DC | PRN

## 2012-04-23 MED ORDER — SENNA 8.6 MG PO TABS
1.0000 | ORAL_TABLET | Freq: Two times a day (BID) | ORAL | Status: DC
  Administered 2012-04-23 – 2012-04-25 (×4): 8.6 mg via ORAL
  Filled 2012-04-23 (×5): qty 1

## 2012-04-23 MED ORDER — ONDANSETRON HCL 4 MG PO TABS: 4.0000 mg | ORAL_TABLET | Freq: Four times a day (QID) | ORAL | Status: DC | PRN

## 2012-04-23 MED ORDER — AMLODIPINE BESYLATE 10 MG PO TABS
10.0000 mg | ORAL_TABLET | Freq: Every day | ORAL | Status: DC
  Administered 2012-04-24 – 2012-04-25 (×2): 10 mg via ORAL
  Filled 2012-04-23 (×3): qty 1

## 2012-04-23 MED ORDER — HYDROXYUREA 500 MG PO CAPS
500.0000 mg | ORAL_CAPSULE | Freq: Two times a day (BID) | ORAL | Status: DC
  Administered 2012-04-23 – 2012-04-25 (×4): 500 mg via ORAL
  Filled 2012-04-23 (×5): qty 1

## 2012-04-23 SURGICAL SUPPLY — 92 items
APL SKNCLS STERI-STRIP NONHPOA (GAUZE/BANDAGES/DRESSINGS) ×1
BAG DECANTER FOR FLEXI CONT (MISCELLANEOUS) IMPLANT
BANDAGE ELASTIC 6 VELCRO ST LF (GAUZE/BANDAGES/DRESSINGS) IMPLANT
BANDAGE ESMARK 6X9 LF (GAUZE/BANDAGES/DRESSINGS) IMPLANT
BENZOIN TINCTURE PRP APPL 2/3 (GAUZE/BANDAGES/DRESSINGS) ×2 IMPLANT
BLADE SAW SGTL 18.5X63.X.64 HD (BLADE) ×2 IMPLANT
BLADE SURG 15 STRL LF DISP TIS (BLADE) ×1 IMPLANT
BLADE SURG 15 STRL SS (BLADE) ×2
BNDG CMPR 9X6 STRL LF SNTH (GAUZE/BANDAGES/DRESSINGS)
BNDG ESMARK 6X9 LF (GAUZE/BANDAGES/DRESSINGS)
BRUSH FEMORAL CANAL (MISCELLANEOUS) IMPLANT
CANISTER SUCTION 1200CC (MISCELLANEOUS) ×2 IMPLANT
CEMENT BONE SIMPLEX SPEEDSET (Cement) ×3 IMPLANT
CEMENT RESTRICTOR DEPUY SZ 3 (Cement) ×1 IMPLANT
CLOTH BEACON ORANGE TIMEOUT ST (SAFETY) ×2 IMPLANT
COVER BACK TABLE 24X17X13 BIG (DRAPES) IMPLANT
COVER SURGICAL LIGHT HANDLE (MISCELLANEOUS) ×2 IMPLANT
CUFF TOURNIQUET SINGLE 34IN LL (TOURNIQUET CUFF) IMPLANT
DRAPE INCISE IOBAN 66X45 STRL (DRAPES) ×4 IMPLANT
DRAPE ORTHO SPLIT 77X108 STRL (DRAPES) ×4
DRAPE SURG ORHT 6 SPLT 77X108 (DRAPES) ×2 IMPLANT
DRAPE U-SHAPE 47X51 STRL (DRAPES) ×2 IMPLANT
DRILL BIT 5/64 (BIT) ×2 IMPLANT
DRSG MEPILEX BORDER 4X12 (GAUZE/BANDAGES/DRESSINGS) IMPLANT
DRSG MEPILEX BORDER 4X8 (GAUZE/BANDAGES/DRESSINGS) ×1 IMPLANT
DURAPREP 26ML APPLICATOR (WOUND CARE) ×2 IMPLANT
ELECT BLADE 6.5 EXT (BLADE) IMPLANT
ELECT CAUTERY BLADE 6.4 (BLADE) ×2 IMPLANT
ELECT REM PT RETURN 9FT ADLT (ELECTROSURGICAL) ×2
ELECTRODE REM PT RTRN 9FT ADLT (ELECTROSURGICAL) ×1 IMPLANT
FACESHIELD LNG OPTICON STERILE (SAFETY) ×4 IMPLANT
GAUZE XEROFORM 1X8 LF (GAUZE/BANDAGES/DRESSINGS) IMPLANT
GLOVE BIO SURGEON STRL SZ 6.5 (GLOVE) ×2 IMPLANT
GLOVE BIOGEL PI IND STRL 7.5 (GLOVE) IMPLANT
GLOVE BIOGEL PI IND STRL 8 (GLOVE) ×2 IMPLANT
GLOVE BIOGEL PI INDICATOR 7.5 (GLOVE) ×1
GLOVE BIOGEL PI INDICATOR 8 (GLOVE) ×2
GLOVE ORTHO TXT STRL SZ7.5 (GLOVE) ×2 IMPLANT
GLOVE SURG ORTHO 8.0 STRL STRW (GLOVE) ×4 IMPLANT
GLOVE SURG SS PI 6.5 STRL IVOR (GLOVE) ×1 IMPLANT
GOWN PREVENTION PLUS XLARGE (GOWN DISPOSABLE) ×2 IMPLANT
GOWN PREVENTION PLUS XXLARGE (GOWN DISPOSABLE) ×2 IMPLANT
GOWN STRL NON-REIN LRG LVL3 (GOWN DISPOSABLE) ×2 IMPLANT
HANDPIECE INTERPULSE COAX TIP (DISPOSABLE)
IMMOBILIZER KNEE 24 THIGH 36 (MISCELLANEOUS) ×1 IMPLANT
IMMOBILIZER KNEE 24 UNIV (MISCELLANEOUS) ×2
KIT BASIN OR (CUSTOM PROCEDURE TRAY) ×2 IMPLANT
KIT ROOM TURNOVER OR (KITS) ×2 IMPLANT
MANIFOLD NEPTUNE II (INSTRUMENTS) ×2 IMPLANT
NDL SUT 6 .5 CRC .975X.05 MAYO (NEEDLE) IMPLANT
NEEDLE 22X1 1/2 (OR ONLY) (NEEDLE) ×2 IMPLANT
NEEDLE MAYO TAPER (NEEDLE)
NS IRRIG 1000ML POUR BTL (IV SOLUTION) ×2 IMPLANT
PACK GENERAL/GYN (CUSTOM PROCEDURE TRAY) ×2 IMPLANT
PACK TOTAL JOINT (CUSTOM PROCEDURE TRAY) ×2 IMPLANT
PAD ARMBOARD 7.5X6 YLW CONV (MISCELLANEOUS) ×4 IMPLANT
PADDING CAST COTTON 6X4 STRL (CAST SUPPLIES) IMPLANT
PASSER SUT SWANSON 36MM LOOP (INSTRUMENTS) ×2 IMPLANT
PILLOW ABDUCTION HIP (SOFTGOODS) ×2 IMPLANT
PRESSURIZER FEMORAL UNIV (MISCELLANEOUS) ×1 IMPLANT
SET HNDPC FAN SPRY TIP SCT (DISPOSABLE) IMPLANT
SHEET MEDIUM DRAPE 40X70 STRL (DRAPES) ×2 IMPLANT
SPONGE GAUZE 4X4 12PLY (GAUZE/BANDAGES/DRESSINGS) IMPLANT
SPONGE LAP 4X18 X RAY DECT (DISPOSABLE) ×2 IMPLANT
STAPLER VISISTAT (STAPLE) IMPLANT
STAPLER VISISTAT 35W (STAPLE) IMPLANT
STRIP CLOSURE SKIN 1/2X4 (GAUZE/BANDAGES/DRESSINGS) ×2 IMPLANT
SUCTION FRAZIER TIP 10 FR DISP (SUCTIONS) ×2 IMPLANT
SUT ETHIBOND NAB CT1 #1 30IN (SUTURE) ×2 IMPLANT
SUT FIBERWIRE #2 38 REV NDL BL (SUTURE) ×4
SUT MNCRL AB 4-0 PS2 18 (SUTURE) ×2 IMPLANT
SUT VIC AB 0 CT1 27 (SUTURE) ×2
SUT VIC AB 0 CT1 27XBRD ANBCTR (SUTURE) ×1 IMPLANT
SUT VIC AB 0 SH 27 (SUTURE) IMPLANT
SUT VIC AB 1 CT1 27 (SUTURE) ×4
SUT VIC AB 1 CT1 27XBRD ANBCTR (SUTURE) ×2 IMPLANT
SUT VIC AB 2-0 CT1 27 (SUTURE) ×4
SUT VIC AB 2-0 CT1 TAPERPNT 27 (SUTURE) ×1 IMPLANT
SUT VIC AB 2-0 SH 27 (SUTURE)
SUT VIC AB 2-0 SH 27XBRD (SUTURE) IMPLANT
SUT VIC AB 3-0 SH 8-18 (SUTURE) ×1 IMPLANT
SUT VICRYL 3-0 CR8 SH (SUTURE) ×2 IMPLANT
SUTURE FIBERWR#2 38 REV NDL BL (SUTURE) ×1 IMPLANT
SYR BULB 3OZ (MISCELLANEOUS) ×2 IMPLANT
SYR CONTROL 10ML LL (SYRINGE) ×2 IMPLANT
TOWEL OR 17X24 6PK STRL BLUE (TOWEL DISPOSABLE) ×2 IMPLANT
TOWEL OR 17X26 10 PK STRL BLUE (TOWEL DISPOSABLE) ×2 IMPLANT
TOWER CARTRIDGE SMART MIX (DISPOSABLE) IMPLANT
TRAY FOLEY CATH 14FR (SET/KITS/TRAYS/PACK) ×2 IMPLANT
UNDERPAD 30X30 INCONTINENT (UNDERPADS AND DIAPERS) ×2 IMPLANT
WATER STERILE IRR 1000ML POUR (IV SOLUTION) ×8 IMPLANT
YANKAUER SUCT BULB TIP NO VENT (SUCTIONS) ×1 IMPLANT

## 2012-04-23 NOTE — Op Note (Signed)
04/23/2012  3:15 PM  PATIENT:  Alejandra [name]   MRN: [id number]  PRE-OPERATIVE DIAGNOSIS:  Right hip avascular necrosis, status post fracture  POST-OPERATIVE DIAGNOSIS:  Same  PROCEDURE:  Procedure(s): Removal of 7.3 mm cannulated screws x3 with hemiarthroplasty right hip  PREOPERATIVE INDICATIONS:  Alejandra [name] is an 71 y.o. female who had a previous right hip fracture that underwent percutaneous pinning. She progressed to avascular necrosis, and had slight prominence of screw heads within the femoral head. She elected for surgical management due to ongoing pain around the right groin and thigh.  The risks benefits and alternatives were discussed with the patient including but not limited to the risks of nonoperative treatment, versus surgical intervention including infection, bleeding, nerve injury, periprosthetic fracture, the need for revision surgery, dislocation, leg length discrepancy, blood clots, cardiopulmonary complications, morbidity, mortality, among others, and they were willing to proceed.  Predicted outcome is good, although there will be at least a six to nine month expected recovery.   OPERATIVE REPORT     SURGEON:  Teryl Lucy, MD    ASSISTANT:  Janace Litten, OPA-C  (Present throughout the entire procedure,  necessary for completion of procedure in a timely manner, assisting with retraction, instrumentation, and closure)     ANESTHESIA:  General    COMPLICATIONS:  None.      COMPONENTS:  Depuy Summit Basic Femoral Fracture stem size 4, with a stem centralizer, a cement restrictor, with a +0 spacer and a size 43 mm fracture head unipolar hip ball, and a total of 2 bags of Depuy CMW 1 Bone Cement.    PROCEDURE IN DETAIL: The patient was met in the holding area and identified.  The appropriate hip  was identified and marked at the operative site. The patient was then transported to the OR  and  placed under general l anesthesia.  At that point, the patient was  placed  in the lateral decubitus position with the operative side up and  secured to the operating room table and all bony prominences padded.     The operative lower extremity was prepped from the iliac crest to the distal leg.  Sterile draping was performed.  Time out was performed prior to incision.      A routine posterolateral approach was utilized via sharp dissection  carried down to the subcutaneous tissue.  Gross bleeders were Bovie  coagulated.  The iliotibial band was quickly identified and incised  along the length of the skin incision.  Self-retaining retractors were  inserted.  With the hip internally rotated, the short external rotators  were identified. The piriformis was tagged with FiberWire, and the hip capsule released in a T-type fashion.  The femoral neck was exposed after the hip was dislocated, and I inspected the femoral head. This demonstrated chondral collapse, and the lamination consistent with avascular necrosis. The fracture itself was actually reasonably stable, and did appear like it healed.  I then removed the 3 cannulated screws using the appropriate screwdriver.  The very tip of one of the cannulated screws was penetrating the femoral head, but had not damaged the acetabulum.  I resected the femoral neck using the appropriate jig. This was performed at approximately a thumb's breadth above the lesser trochanter.    I then exposed the deep acetabulum, cleared out any tissue including the ligamentum teres, and included the hip capsule in the FiberWire used above and below the T.    I then prepared the proximal femur using  the cookie-cutter, the lateralizing reamer, and then sequentially broached.  A trial utilized, and I reduced the hip and it was found to have excellent stability with functional range of motion. The trial components were then removed.   I then placed a cement restrictor, and cemented the real components in place. All excess cement was removed. Once the  cement had cured, I impacted the real head ball into place. The hip was then reduced and taken through functional range of motion and found to have excellent stability. Leg lengths were restored.  I then used a 2 mm drill bits to pass the FiberWire suture from the capsule and puriform is through the greater trochanter, and secured this. Excellent posterior capsular repair was achieved. I also closed the T in the capsule.  I then irrigated the hip copiously again with pulse lavage, and repaired the fascia with Vicryl, followed by Vicryl for the subcutaneous tissue, Monocryl for the skin, Steri-Strips and sterile gauze. The wounds were injected. The patient was then awakened and returned to PACU in stable and satisfactory condition. There were no complications.  She had a fair amount of bleeding, with estimated blood loss almost a liter, with a fair amount of bleeding within the medullary canal. Her platelets did not seem to be working well, and she was given both transfusion, 2 units, as well as platelets. We will continue to monitor this postoperatively, and Dr. Truett Perna had already commented on the abnormal blood consistent joint preoperatively, and we may get him involved if needed.   Teryl Lucy, MD Orthopedic Surgeon [phone]   04/23/2012 3:15 PM

## 2012-04-23 NOTE — Progress Notes (Signed)
Dr. Jean Rosenthal visited patient at bedside

## 2012-04-23 NOTE — H&P (Signed)
PREOPERATIVE H&P  Chief Complaint: Right hip pain  HPI: Alejandra [name] is a 71 y.o. female who presents for preoperative history and physical with a diagnosis of right hip nonunion, status post fracture, who had cannulated hip pinning. Symptoms are rated as moderate to severe, and have been worsening.  This is significantly impairing activities of daily living.  She has elected for surgical management. She has had progressive collapse on x-ray, with the screws threatening to break through the cortex of the femoral head.  Past Medical History  Diagnosis Date  . Arthritis   . Glaucoma   . Hypertension   . Fracture 10/05/11    "fell and broke right hip"  . Fracture of femoral neck, right 10/12/2011  . Glaucoma     bilateral  . Polycythemia vera    Past Surgical History  Procedure Date  . Hip pinning,cannulated 10/12/2011    Procedure: CANNULATED HIP PINNING;  Surgeon: Eulas Post, MD;  Location: [facility] OR;  Service: Orthopedics;  Laterality: Right;  . Colonoscopy    History   Social History  . Marital Status: Widowed    Spouse Name: N/A    Number of Children: N/A  . Years of Education: N/A   Social History Main Topics  . Smoking status: Former Smoker -- 0.1 packs/day    Types: Cigarettes    Quit date: 08/15/1991  . Smokeless tobacco: Never Used  . Alcohol Use: No     10/12/11 "did drink in my younger; don't drink at all now"  . Drug Use: No  . Sexually Active: No   Other Topics Concern  . Not on file   Social History Narrative  . No narrative on file   No family history on file. No Known Allergies Prior to Admission medications   Medication Sig Start Date End Date Taking? Authorizing Provider  ALPRAZolam (XANAX) 0.25 MG tablet Take 0.25 mg by mouth 2 (two) times daily as needed.   Yes Historical Provider, MD  amLODipine (NORVASC) 10 MG tablet Take 10 mg by mouth daily.   Yes Historical Provider, MD  aspirin EC 81 MG tablet Take 81 mg by mouth daily.   Yes Historical  Provider, MD  benazepril (LOTENSIN) 40 MG tablet Take 40 mg by mouth daily.   Yes Historical Provider, MD  calcium-vitamin D (OSCAL WITH D) 500-200 MG-UNIT per tablet Take 1 tablet by mouth daily.   Yes Historical Provider, MD  dorzolamide-timolol (COSOPT) 22.3-6.8 MG/ML ophthalmic solution Place 1 drop into both eyes 2 (two) times daily.   Yes Historical Provider, MD  hydroxyurea (HYDREA) 500 MG capsule Take 500 mg by mouth 2 (two) times daily. May take with food to minimize GI side effects.   Yes Historical Provider, MD  traMADol (ULTRAM) 50 MG tablet Take 50 mg by mouth every 6 (six) hours as needed. For pain.   Yes Historical Provider, MD     Positive ROS: All other systems have been reviewed and were otherwise negative with the exception of those mentioned in the HPI and as above.  Physical Exam: General: Alert, no acute distress Cardiovascular: No pedal edema Respiratory: No cyanosis, no use of accessory musculature GI: No organomegaly, abdomen is soft and non-tender Skin: No lesions in the area of chief complaint Neurologic: Sensation intact distally Psychiatric: Patient is competent for consent with normal mood and affect Lymphatic: No axillary or cervical lymphadenopathy  MUSCULOSKELETAL: Right hip EHL and FHL are firing. She has pain in her groin.  Assessment: Right  hip nonunion with collapse, status post hip fracture pinning  Plan: Plan for Procedure(s): HARDWARE REMOVAL ARTHROPLASTY (hemi) HIP  The risks benefits and alternatives were discussed with the patient including but not limited to the risks of nonoperative treatment, versus surgical intervention including infection, bleeding, nerve injury, periprosthetic fracture, the need for revision surgery, dislocation, leg length discrepancy, blood clots, cardiopulmonary complications, morbidity, mortality, among others, and they were willing to proceed.     [name] P, MD Cell [phone] Pager [phone]  04/23/2012 12:42 PM

## 2012-04-23 NOTE — Preoperative (Signed)
Beta Blockers   Reason not to administer Beta Blockers:Not Applicable 

## 2012-04-23 NOTE — Transfer of Care (Signed)
Immediate Anesthesia Transfer of Care Note  Patient: Alejandra [name]  Procedure(s) Performed: Procedure(s) (LRB) with comments: HARDWARE REMOVAL (Right) ARTHROPLASTY BIPOLAR HIP (Right)  Patient Location: PACU  Anesthesia Type: General  Level of Consciousness: awake, alert  and oriented  Airway & Oxygen Therapy: Patient Spontanous Breathing and Patient connected to face mask oxygen  Post-op Assessment: Report given to PACU RN  Post vital signs: Reviewed and stable  Complications: No apparent anesthesia complications

## 2012-04-23 NOTE — Anesthesia Preprocedure Evaluation (Addendum)
Anesthesia Evaluation  Patient identified by MRN, date of birth, ID band Patient awake    Reviewed: Allergy & Precautions, H&P , NPO status , Patient's Chart, lab work & pertinent test results  History of Anesthesia Complications Negative for: history of anesthetic complications  Airway Mallampati: II TM Distance: >3 FB Neck ROM: Full    Dental  (+) Teeth Intact and Dental Advisory Given   Pulmonary former smoker,  breath sounds clear to auscultation  Pulmonary exam normal       Cardiovascular hypertension, Pt. on medications Rhythm:Regular Rate:Normal     Neuro/Psych negative neurological ROS     GI/Hepatic negative GI ROS, Neg liver ROS,   Endo/Other  negative endocrine ROS  Renal/GU negative Renal ROS     Musculoskeletal   Abdominal   Peds  Hematology  (+) Blood dyscrasia (polycythemia vera, thrombocytosis: stable for surgery as per hematologist Dr. Truett Perna), ,   Anesthesia Other Findings   Reproductive/Obstetrics                          Anesthesia Physical Anesthesia Plan  ASA: III  Anesthesia Plan: General   Post-op Pain Management:    Induction: Intravenous  Airway Management Planned: Oral ETT  Additional Equipment:   Intra-op Plan:   Post-operative Plan: Extubation in OR  Informed Consent: I have reviewed the patients History and Physical, chart, labs and discussed the procedure including the risks, benefits and alternatives for the proposed anesthesia with the patient or authorized representative who has indicated his/her understanding and acceptance.   Dental advisory given  Plan Discussed with: CRNA, Surgeon and Anesthesiologist  Anesthesia Plan Comments: (Plan routine monitors, GETA)       Anesthesia Quick Evaluation

## 2012-04-23 NOTE — Anesthesia Postprocedure Evaluation (Signed)
  Anesthesia Post-op Note  Patient: Alejandra [name]  Procedure(s) Performed: Procedure(s) (LRB) with comments: HARDWARE REMOVAL (Right) ARTHROPLASTY BIPOLAR HIP (Right)  Patient Location: PACU  Anesthesia Type: General  Level of Consciousness: awake, alert  and oriented  Airway and Oxygen Therapy: Patient Spontanous Breathing and Patient connected to nasal cannula oxygen  Post-op Pain: none  Post-op Assessment: Post-op Vital signs reviewed, Patient's Cardiovascular Status Stable, Respiratory Function Stable, Patent Airway, No signs of Nausea or vomiting and Pain level controlled  Post-op Vital Signs: Reviewed and stable  Complications: No apparent anesthesia complications

## 2012-04-23 NOTE — Progress Notes (Signed)
ANTICOAGULATION CONSULT NOTE - Initial Consult  Pharmacy Consult for Coumadin Indication: VTE prophylaxis  No Known Allergies  Patient Measurements: Height: 5' 1.02" (155 cm) Weight: 132 lb 11.5 oz (60.2 kg) IBW/kg (Calculated) : 47.86  Heparin Dosing Weight:   Vital Signs: Temp: 98.3 F (36.8 C) (09/10 2020) Temp src: Oral (09/10 0956) BP: 136/78 mmHg (09/10 2020) Pulse Rate: 64  (09/10 2020)  Labs:  Basename 04/23/12 1603 04/23/12 1007  HGB 9.9* 10.6*  HCT 29.0* 33.8*  PLT -- 766*  APTT -- 32  LABPROT -- 17.1*  INR -- 1.37  HEPARINUNFRC -- --  CREATININE -- --  CKTOTAL -- --  CKMB -- --  TROPONINI -- --    Estimated Creatinine Clearance: 53 ml/min (by C-G formula based on Cr of 0.76).   Medical History: Past Medical History  Diagnosis Date  . Arthritis   . Glaucoma   . Hypertension   . Fracture 10/05/11    "fell and broke right hip"  . Fracture of femoral neck, right 10/12/2011  . Glaucoma     bilateral  . Polycythemia vera   . Avascular necrosis of femur head, right 04/23/2012  . Thrombocytosis 04/23/2012    Medications:  Scheduled:    . acetaminophen  1,000 mg Intravenous Q6H  . amLODipine  10 mg Oral Daily  . aspirin EC  81 mg Oral Daily  . benazepril  40 mg Oral Daily  . calcium-vitamin D  1 tablet Oral Daily  .  ceFAZolin (ANCEF) IV  2 g Intravenous Q6H  . coumadin book   Does not apply Once  . docusate sodium  100 mg Oral BID  . dorzolamide-timolol  1 drop Both Eyes BID  . enoxaparin (LOVENOX) injection  40 mg Subcutaneous Q24H  . HYDROmorphone      . HYDROmorphone      . hydroxyurea  500 mg Oral BID  . senna  1 tablet Oral BID  . warfarin  5 mg Oral Once  . warfarin   Does not apply Once  . Warfarin - Pharmacist Dosing Inpatient   Does not apply q1800  . DISCONTD:  ceFAZolin (ANCEF) IV  2 g Intravenous 60 min Pre-Op    Assessment: 71 yr old female admitted to have hardware removed from her rt hip (placed following fractured hip).  Baseline INR 1.37   PLCT 766  Pt has polycythemia vera.  Goal of Therapy:  INR 2-3 Monitor platelets by anticoagulation protocol: Yes   Plan:  Coumadin 5 mg tonight. Daily PT/INR Order coumadin booklet and video for pt.  Eugene Garnet 04/23/2012,9:35 PM

## 2012-04-23 NOTE — Progress Notes (Signed)
CRNA did ISTAT H&H   9.9 29

## 2012-04-24 ENCOUNTER — Encounter (HOSPITAL_COMMUNITY): Payer: Self-pay | Admitting: Orthopedic Surgery

## 2012-04-24 LAB — CBC
Hemoglobin: 11.1 g/dL — ABNORMAL LOW (ref 12.0–15.0)
MCHC: 33 g/dL (ref 30.0–36.0)
Platelets: 583 10*3/uL — ABNORMAL HIGH (ref 150–400)
RBC: 3.7 MIL/uL — ABNORMAL LOW (ref 3.87–5.11)

## 2012-04-24 LAB — TYPE AND SCREEN
ABO/RH(D): A POS
Unit division: 0

## 2012-04-24 LAB — BASIC METABOLIC PANEL
Calcium: 8.9 mg/dL (ref 8.4–10.5)
GFR calc non Af Amer: 85 mL/min — ABNORMAL LOW (ref >90)
Glucose, Bld: 104 mg/dL — ABNORMAL HIGH (ref 70–99)
Potassium: 3.9 mEq/L (ref 3.5–5.1)
Sodium: 135 mEq/L (ref 135–145)

## 2012-04-24 LAB — PREPARE PLATELET PHERESIS

## 2012-04-24 LAB — PROTIME-INR
INR: 1.44 (ref 0.00–1.49)
Prothrombin Time: 17.8 seconds — ABNORMAL HIGH (ref 11.6–15.2)

## 2012-04-24 MED ORDER — WARFARIN SODIUM 5 MG PO TABS
5.0000 mg | ORAL_TABLET | Freq: Once | ORAL | Status: AC
  Administered 2012-04-24: 5 mg via ORAL
  Filled 2012-04-24: qty 1

## 2012-04-24 NOTE — Progress Notes (Signed)
[name], female   DOB: [birth year], 71 y.o.   MRN: [id number]     Subjective:  Patient reports pain as mild to moderate.  She denies any CP or SOB. States that most of her pain is around the incision.  Objective:   VITALS:   Filed Vitals:   04/23/12 2100 04/23/12 2156 04/24/12 0219 04/24/12 0630  BP:  121/73 130/68 116/70  Pulse:  72 63 72  Temp:  98.2 F (36.8 C) 99.7 F (37.6 C) 99.2 F (37.3 C)  TempSrc:      Resp:  20 18 18   Height: 5' 1.02" (1.55 m)     Weight: 60.2 kg (132 lb 11.5 oz)     SpO2:  100% 100% 100%    ABD soft Sensation intact distally Dorsiflexion/Plantar flexion intact Incision: dressing C/D/I and scant drainage  LABS  Results for orders placed during the hospital encounter of 04/23/12 (from the past 24 hour(s))  APTT     Status: Normal   Collection Time   04/23/12 10:07 AM      Component Value Range   aPTT 32  24 - 37 seconds  PROTIME-INR     Status: Abnormal   Collection Time   04/23/12 10:07 AM      Component Value Range   Prothrombin Time 17.1 (*) 11.6 - 15.2 seconds   INR 1.37  0.00 - 1.49  CBC     Status: Abnormal   Collection Time   04/23/12 10:07 AM      Component Value Range   WBC 4.4  4.0 - 10.5 K/uL   RBC 3.64 (*) 3.87 - 5.11 MIL/uL   Hemoglobin 10.6 (*) 12.0 - 15.0 g/dL   HCT 40.9 (*) 81.1 - 91.4 %   MCV 92.9  78.0 - 100.0 fL   MCH 29.1  26.0 - 34.0 pg   MCHC 31.4  30.0 - 36.0 g/dL   RDW 78.2 (*) 95.6 - 21.3 %   Platelets 766 (*) 150 - 400 K/uL  PREPARE PLATELET PHERESIS     Status: Normal (Preliminary result)   Collection Time   04/23/12  2:43 PM      Component Value Range   Unit Number [id number]     Blood Component Type PLTPHER LR1     Unit division 00     Status of Unit ISSUED     Transfusion Status OK TO TRANSFUSE    POCT I-STAT 4, (NA,K, GLUC, HGB,HCT)     Status: Abnormal   Collection Time   04/23/12  4:03 PM      Component Value Range   Sodium 140  135 - 145 mEq/L   Potassium 4.3  3.5 - 5.1  mEq/L   Glucose, Bld 109 (*) 70 - 99 mg/dL   HCT 28.4 (*) 13.2 - 44.0 %   Hemoglobin 9.9 (*) 12.0 - 15.0 g/dL  CBC     Status: Abnormal   Collection Time   04/24/12  5:36 AM      Component Value Range   WBC 6.1  4.0 - 10.5 K/uL   RBC 3.70 (*) 3.87 - 5.11 MIL/uL   Hemoglobin 11.1 (*) 12.0 - 15.0 g/dL   HCT 10.2 (*) 72.5 - 36.6 %   MCV 90.8  78.0 - 100.0 fL   MCH 30.0  26.0 - 34.0 pg   MCHC 33.0  30.0 - 36.0 g/dL   RDW 44.0 (*) 34.7 - 42.5 %   Platelets 583 (*) 150 -  400 K/uL  BASIC METABOLIC PANEL     Status: Abnormal   Collection Time   04/24/12  5:36 AM      Component Value Range   Sodium 135  135 - 145 mEq/L   Potassium 3.9  3.5 - 5.1 mEq/L   Chloride 103  96 - 112 mEq/L   CO2 25  19 - 32 mEq/L   Glucose, Bld 104 (*) 70 - 99 mg/dL   BUN 14  6 - 23 mg/dL   Creatinine, Ser 7.82  0.50 - 1.10 mg/dL   Calcium 8.9  8.4 - 95.6 mg/dL   GFR calc non Af Amer 85 (*) >90 mL/min   GFR calc Af Amer >90  >90 mL/min  PROTIME-INR     Status: Abnormal   Collection Time   04/24/12  5:36 AM      Component Value Range   Prothrombin Time 17.8 (*) 11.6 - 15.2 seconds   INR 1.44  0.00 - 1.49    Dg Pelvis Portable  04/23/2012  *RADIOLOGY REPORT*  Clinical Data: Postoperative exam after right hip total arthroplasty  PORTABLE PELVIS  Comparison: 10/13/2011  Findings: Interval placement of right total hip arthroplasty noted. Subcutaneous gas is compatible with recent surgery.  Allowing for patient positioning angulation, femoral component appears properly located.  No evidence for hardware failure or fracture line.  IMPRESSION: Expected postoperative appearance after right hip total arthroplasty.   Original Report Authenticated By: Harrel Lemon, M.D.    Dg Hip Portable 1 View Left  04/23/2012  *RADIOLOGY REPORT*  Clinical Data: Postop right total hip.  PORTABLE LEFT HIP - 1 VIEW  Comparison: AP view of the pelvis portable 04/23/2012  Findings: Pain cross-table lateral view performed portably shows  postoperative changes of the right hip arthroplasty.  Acetabulum is difficult to visualize given the overlying soft tissues.  No gross evidence of dislocation.  Subcutaneous gas is noted.  No evidence of periprosthetic fracture.  IMPRESSION: Immediate postoperative changes of right hip arthroplasty.  Although evaluation of the acetabula is limited, the arthroplasty appears located.  No hardware complication identified.   Original Report Authenticated By: Britta Mccreedy, M.D.     Assessment/Plan: 1 Day Post-Op   Principal Problem:  *Avascular necrosis of femur head, right Active Problems:  Thrombocytosis   Advance diet Up with therapy May plan for SNF later in the week.    Perkins Molina P 04/24/2012, 9:10 AM   Teryl Lucy, MD Cell [phone] Pager [phone]

## 2012-04-24 NOTE — Progress Notes (Signed)
Utilization review completed. Laurianne Floresca, RN, BSN. 

## 2012-04-24 NOTE — Progress Notes (Signed)
ANTICOAGULATION CONSULT NOTE   Pharmacy Consult for Coumadin Indication: VTE prophylaxis  No Known Allergies  Patient Measurements: Height: 5' 1.02" (155 cm) Weight: 132 lb 11.5 oz (60.2 kg) IBW/kg (Calculated) : 47.86   Vital Signs: Temp: 99.2 F (37.3 C) (09/11 0630) BP: 116/70 mmHg (09/11 0630) Pulse Rate: 72  (09/11 0630)  Labs:  Basename 04/24/12 0536 04/23/12 1603 04/23/12 1007  HGB 11.1* 9.9* --  HCT 33.6* 29.0* 33.8*  PLT 583* -- 766*  APTT -- -- 32  LABPROT 17.8* -- 17.1*  INR 1.44 -- 1.37  HEPARINUNFRC -- -- --  CREATININE 0.69 -- --  CKTOTAL -- -- --  CKMB -- -- --  TROPONINI -- -- --    Estimated Creatinine Clearance: 53 ml/min (by C-G formula based on Cr of 0.69).   Medical History: Past Medical History  Diagnosis Date  . Arthritis   . Glaucoma   . Hypertension   . Fracture 10/05/11    "fell and broke right hip"  . Fracture of femoral neck, right 10/12/2011  . Glaucoma     bilateral  . Polycythemia vera   . Avascular necrosis of femur head, right 04/23/2012  . Thrombocytosis 04/23/2012    Medications:  Scheduled:     . acetaminophen  1,000 mg Intravenous Q6H  . amLODipine  10 mg Oral Daily  . aspirin EC  81 mg Oral Daily  . benazepril  40 mg Oral Daily  . calcium-vitamin D  1 tablet Oral Daily  .  ceFAZolin (ANCEF) IV  2 g Intravenous Q6H  . coumadin book   Does not apply Once  . docusate sodium  100 mg Oral BID  . dorzolamide-timolol  1 drop Both Eyes BID  . enoxaparin (LOVENOX) injection  40 mg Subcutaneous Q24H  . HYDROmorphone      . HYDROmorphone      . hydroxyurea  500 mg Oral BID  . senna  1 tablet Oral BID  . warfarin  5 mg Oral Once  . warfarin   Does not apply Once  . Warfarin - Pharmacist Dosing Inpatient   Does not apply q1800  . DISCONTD:  ceFAZolin (ANCEF) IV  2 g Intravenous 60 min Pre-Op    Assessment: 71 yr old female admitted to have hardware removed from her rt hip (placed following fractured hip).  INR is 1.44  on day 2 of coumadin and also noted on lovenox 40mg  [place] daily.  Goal of Therapy:  INR 2-3 Monitor platelets by anticoagulation protocol: Yes   Plan:  Coumadin 5 mg tonight. Daily PT/INR  Harland German, Pharm D 04/24/2012 8:21 AM

## 2012-04-24 NOTE — Evaluation (Signed)
Occupational Therapy Evaluation and Discharge Patient Details Name: Alejandra [name] MRN: [id number] DOB: 12-13-1940 Today's Date: 04/24/2012 Time: 4098-1191 OT Time Calculation (min): 12 min  OT Assessment / Plan / Recommendation Clinical Impression  This 71 yo female s/p RTHA revision from [facility] presents to acute OT with problems below. WIll benefit from follow up OT at SNF. Will D/C from acute OT.    OT Assessment  Patient needs continued OT Services    Follow Up Recommendations  Skilled nursing facility    Barriers to Discharge Decreased caregiver support    Equipment Recommendations  Defer to next venue    Recommendations for Other Services    Frequency       Precautions / Restrictions Precautions Precautions: Posterior Hip;Fall Restrictions Weight Bearing Restrictions: Yes RLE Weight Bearing: Weight bearing as tolerated   Pertinent Vitals/Pain 3/ 10 right hip    ADL  Eating/Feeding: Simulated;Independent Where Assessed - Eating/Feeding: Bed level Grooming: Simulated;Set up Where Assessed - Grooming: Unsupported sitting Upper Body Bathing: Simulated;Set up Where Assessed - Upper Body Bathing: Unsupported sitting Lower Body Bathing: Simulated;Maximal assistance Where Assessed - Lower Body Bathing: Supported sit to stand Upper Body Dressing: Simulated;Set up Where Assessed - Upper Body Dressing: Unsupported sitting Lower Body Dressing: +1 Total assistance Where Assessed - Lower Body Dressing: Supported sit to stand Equipment Used: Rolling walker;Gait belt Transfers/Ambulation Related to ADLs: Mod A for sit to stand    OT Diagnosis: Generalized weakness;Acute pain  OT Problem List: Decreased strength;Impaired balance (sitting and/or standing);Decreased knowledge of use of DME or AE;Pain OT Treatment Interventions:     OT Goals    Visit Information  Last OT Received On: 04/24/12 Assistance Needed: +1    Subjective Data  Patient Stated Goal: Go to SNF for  rehab   Prior Functioning  Vision/Perception  Home Living Lives With: Alone Available Help at Discharge: Skilled Nursing Facility Communication Communication: No difficulties Dominant Hand: Right      Cognition  Overall Cognitive Status: Appears within functional limits for tasks assessed/performed Arousal/Alertness: Awake/alert Orientation Level: Appears intact for tasks assessed Behavior During Session: [facility] for tasks performed    Extremity/Trunk Assessment Right Upper Extremity Assessment RUE ROM/Strength/Tone: Within functional levels Left Upper Extremity Assessment LUE ROM/Strength/Tone: Within functional levels   Mobility  Shoulder Instructions  Bed Mobility Bed Mobility: Scooting to HOB;Rolling Right;Supine to Sit;Sitting - Scoot to Edge of Bed Rolling Right: 4: Min assist Supine to Sit: 4: Min assist;HOB elevated;With rails (30 degrees) Sitting - Scoot to Edge of Bed: 4: Min assist;With rail Scooting to [facility]: 3: Mod assist;With rail (and VCs for handplacement for pt to A) Transfers Transfers: Sit to Stand;Stand to Sit Sit to Stand: 3: Mod assist;With upper extremity assist;From bed Stand to Sit: 4: Min assist;With upper extremity assist;To bed             End of Session OT - End of Session Equipment Utilized During Treatment: Gait belt (RW) Activity Tolerance: Patient tolerated treatment well Patient left: in bed;with call bell/phone within reach       Evette Georges [phone] 04/24/2012, 4:07 PM

## 2012-04-24 NOTE — Evaluation (Signed)
Physical Therapy Evaluation Patient Details Name: Alejandra [name] MRN: [id number] DOB: [birth year] Today's Date: 04/24/2012 Time: 1152-1220 PT Time Calculation (min): 28 min  PT Assessment / Plan / Recommendation Clinical Impression  Pt is a 71 y/o female s/p R hip hemi arthroplasty.  Pt to d/c to SNF for rehab.  Acute PT to follow pt.      PT Assessment  Patient needs continued PT services    Follow Up Recommendations  Skilled nursing facility    Barriers to Discharge None      Equipment Recommendations  Defer to next venue    Recommendations for Other Services     Frequency 7X/week    Precautions / Restrictions Precautions Precautions: Posterior Hip Precaution Booklet Issued: Yes (comment) Precaution Comments: Educated pt on posterior hip precautions Restrictions Weight Bearing Restrictions: Yes RLE Weight Bearing: Weight bearing as tolerated   Pertinent Vitals/Pain Pt reporting 4-6/10 pain in R Hip.  Premedicated.       Mobility  Transfers Transfers: Sit to Stand;Stand to Sit;Stand Pivot Transfers Sit to Stand: 3: Mod assist;From bed;With upper extremity assist Stand to Sit: 3: Mod assist;To chair/3-in-1;With upper extremity assist Stand Pivot Transfers: 3: Mod assist Details for Transfer Assistance: Assist to initiate standing, control descent to sit and manage RW with stand pivot  transfer. Pt having difficulty weight bearing on R LE.   Ambulation/Gait Ambulation/Gait Assistance: 3: Mod assist Ambulation Distance (Feet): 5 Feet Assistive device: Rolling walker Ambulation/Gait Assistance Details: step by step cueing for sequencing. Assist to advance R LE and to support pt weight to advance LLE.   Gait Pattern: Step-to pattern;Decreased step length - left;Decreased step length - right;Decreased stance time - right;Decreased hip/knee flexion - right;Decreased hip/knee flexion - left;Decreased weight shift to right Stairs: No Wheelchair Mobility Wheelchair Mobility:  No    Exercises Total Joint Exercises Ankle Circles/Pumps: AROM;Both;10 reps Heel Slides: AAROM;Right;5 reps;Supine   PT Diagnosis: Difficulty walking;Abnormality of gait;Generalized weakness;Acute pain  PT Problem List: Decreased strength;Decreased range of motion;Decreased activity tolerance;Decreased mobility;Decreased knowledge of use of DME;Decreased knowledge of precautions;Pain PT Treatment Interventions: Gait training;DME instruction;Functional mobility training;Therapeutic activities;Therapeutic exercise;Patient/family education   PT Goals Acute Rehab PT Goals PT Goal Formulation: With patient Time For Goal Achievement: 05/01/12 Potential to Achieve Goals: Good Pt will go Supine/Side to Sit: with supervision PT Goal: Supine/Side to Sit - Progress: Goal set today Pt will go Sit to Supine/Side: with supervision PT Goal: Sit to Supine/Side - Progress: Goal set today Pt will go Sit to Stand: with supervision PT Goal: Sit to Stand - Progress: Goal set today Pt will go Stand to Sit: with supervision PT Goal: Stand to Sit - Progress: Goal set today Pt will Transfer Bed to Chair/Chair to Bed: with supervision PT Transfer Goal: Bed to Chair/Chair to Bed - Progress: Goal set today Pt will Ambulate: 51 - 150 feet;with supervision PT Goal: Ambulate - Progress: Goal set today Pt will Perform Home Exercise Program: with supervision, verbal cues required/provided PT Goal: Perform Home Exercise Program - Progress: Goal set today Additional Goals Additional Goal #1: Pt will verbalize and demonstrate 3/3 posterior hip precautions.  PT Goal: Additional Goal #1 - Progress: Goal set today  Visit Information  Last PT Received On: 04/24/12 Assistance Needed: +1    Subjective Data  Subjective: I am sore.  Patient Stated Goal: Walk without pain.    Prior Functioning  Home Living Lives With: Alone Available Help at Discharge: Skilled Nursing Facility Prior Function Level of Independence:  Independent with assistive device(s)    Cognition  Overall Cognitive Status: Appears within functional limits for tasks assessed/performed Arousal/Alertness: Awake/alert Orientation Level: Appears intact for tasks assessed Behavior During Session: [facility] for tasks performed    Extremity/Trunk Assessment Right Upper Extremity Assessment RUE ROM/Strength/Tone: Within functional levels Left Upper Extremity Assessment LUE ROM/Strength/Tone: Within functional levels Right Lower Extremity Assessment RLE ROM/Strength/Tone: Deficits;Due to pain;Due to precautions Left Lower Extremity Assessment LLE ROM/Strength/Tone: Within functional levels Trunk Assessment Trunk Assessment: Normal   Balance Balance Balance Assessed: No  End of Session PT - End of Session Equipment Utilized During Treatment: Gait belt Activity Tolerance: Patient limited by fatigue;Patient limited by pain;Treatment limited secondary to medical complications (Comment) (c/o dizziness) Patient left: in chair;with call bell/phone within reach  GP     Hazelynn Mckenny 04/24/2012, 2:15 PM  Yahya Boldman L. Nini Cavan DPT [phone]

## 2012-04-24 NOTE — Progress Notes (Signed)
Orthopedic Tech Progress Note Patient Details:  Alejandra [name] 1941/01/05 [id number]  [name], female   DOB: 01-Sep-1940, 71 y.o.   MRN: [id number]   [name] 04/24/2012, 5:18 PM TRAPEZE [name]

## 2012-04-24 NOTE — Clinical Social Work Psychosocial (Signed)
     Clinical Social Work Department BRIEF PSYCHOSOCIAL ASSESSMENT 04/24/2012  Patient:  Alejandra [name], Alejandra [name]     Account Number:  [id number]     Admit date:  04/23/2012  Clinical Social Worker:  Burnard Hawthorne  Date/Time:  04/24/2012 03:17 PM  Referred by:  Physician  Date Referred:  04/24/2012 Referred for  SNF Placement   Other Referral:   Interview type:  Patient Other interview type:    PSYCHOSOCIAL DATA Living Status:  ALONE Admitted from facility:   Level of care:   Primary support name:  Zachery Dauer  Sister Primary support relationship to patient:  SIBLING Degree of support available:   Strong support from sister and brother    CURRENT CONCERNS Current Concerns  Post-Acute Placement   Other Concerns:    SOCIAL WORK ASSESSMENT / PLAN Met with patient today to discuss referral for short term SNF. Patient lives alone and states she wants to seek short term SNF. She states that she has been a resident of [facility] [place] in the past (about 7 months ago) and would like to return there if possible.  Discussed bed search process and she agreed to search. Notified Tammy Emelia Loron- Admissions of [facility] of patient's request. Awaiting bed offer.  Fl2 completed and placed on chart for MD's signature.   Assessment/plan status:  Psychosocial Support/Ongoing Assessment of Needs Other assessment/ plan:   Information/referral to community resources:   SNF bed search list provided  Discussed aftercare needs-- to be arranged by SNF if needed    PATIENTS/FAMILYS RESPONSE TO PLAN OF CARE: Patient is alert, oriented and very pleasant. She states that she had a very positive stay at [facility] and she is hopeful to return there.  Patient plans to return home after rehab stay. She denies any further questions or issues.  CSW will assist with d/c.

## 2012-04-24 NOTE — Clinical Social Work Placement (Addendum)
    Clinical Social Work Department CLINICAL SOCIAL WORK PLACEMENT NOTE 04/24/2012  Patient:  Alejandra [name], Alejandra [name]  Account Number:  [id number] Admit date:  04/23/2012  Clinical Social Worker:  Lupita Leash Ciena Sampley, BSW  Date/time:  04/24/2012 03:30 PM  Clinical Social Work is seeking post-discharge placement for this patient at the following level of care:   SKILLED NURSING   (*CSW will update this form in Epic as items are completed)   04/24/2012  Patient/family provided with [facility] Department of Clinical Social Work's list of facilities offering this level of care within the geographic area requested by the patient (or if unable, by the patient's family).  04/24/2012  Patient/family informed of their freedom to choose among providers that offer the needed level of care, that participate in Medicare, Medicaid or managed care program needed by the patient, have an available bed and are willing to accept the patient.  04/24/2012  Patient/family informed of [facility]' ownership interest in [facility], as well as of the fact that they are under no obligation to receive care at this facility.  PASARR submitted to EDS on  PASARR number received from EDS on   FL2 transmitted to all facilities in geographic area requested by pt/family on  04/24/2012 FL2 transmitted to all facilities within larger geographic area on   Patient informed that his/her managed care company has contracts with or will negotiate with  certain facilities, including the following:   Has existing PASARR.   [employer]- Selena Batten is CM     Patient/family informed of bed offers received: 04/25/12 Patient chooses bed at [facility] Physician recommends and patient chooses bed at    Patient to be transferred to [facility] on  04/25/12   Patient to be transferred to facility by [facility])  The following physician request were entered in Epic:   Additional Comments:Patient is  very pleased with d/c plan. Notified SNF and pt's nurse Beth of d/c plan.  Lorri Frederick. [place]  [phone]

## 2012-04-25 LAB — BASIC METABOLIC PANEL
BUN: 11 mg/dL (ref 6–23)
Chloride: 102 mEq/L (ref 96–112)
GFR calc Af Amer: >90 mL/min (ref >90)
GFR calc non Af Amer: 85 mL/min — ABNORMAL LOW (ref >90)
Potassium: 4.1 mEq/L (ref 3.5–5.1)
Sodium: 137 mEq/L (ref 135–145)

## 2012-04-25 LAB — CBC
HCT: 33 % — ABNORMAL LOW (ref 36.0–46.0)
MCHC: 33.3 g/dL (ref 30.0–36.0)
Platelets: 648 10*3/uL — ABNORMAL HIGH (ref 150–400)
RDW: 16 % — ABNORMAL HIGH (ref 11.5–15.5)
WBC: 8.9 10*3/uL (ref 4.0–10.5)

## 2012-04-25 LAB — PROTIME-INR
INR: 3.31 — ABNORMAL HIGH (ref 0.00–1.49)
Prothrombin Time: 34.1 seconds — ABNORMAL HIGH (ref 11.6–15.2)

## 2012-04-25 MED ORDER — WHITE PETROLATUM GEL
Status: AC
  Administered 2012-04-25: 11:00:00
  Filled 2012-04-25: qty 5

## 2012-04-25 NOTE — Progress Notes (Signed)
Rept given to Virgil Benedict RN at [facility].

## 2012-04-25 NOTE — Progress Notes (Signed)
Physical Therapy Treatment Patient Details Name: Alejandra [name] MRN: [id number] DOB: [birth year] Today's Date: 04/25/2012 Time: 4098-1191 PT Time Calculation (min): 25 min  PT Assessment / Plan / Recommendation Comments on Treatment Session  Pt making steady progress with increased activity today and less assistance/cues needed for mobility. Still requires cues for safety and to follow and maintaint precautions.    Follow Up Recommendations  Skilled nursing facility       Equipment Recommendations  Defer to next venue       Frequency 7X/week   Plan Discharge plan remains appropriate;Frequency remains appropriate    Precautions / Restrictions Precautions Precautions: Posterior Hip Precaution Comments: Re-educated pt on 3/3 posterior hip precautions as pt unable to recall any of them. Restrictions RLE Weight Bearing: Weight bearing as tolerated    Pertinent Vitals/Pain 0/10 and 4/10 right hip pain. RN aware and pt was premedicated.    Mobility  Bed Mobility Supine to Sit: 4: Min assist;HOB elevated;With rails Sitting - Scoot to Edge of Bed: 4: Min assist;With rail Details for Bed Mobility Assistance: cues for sequence and technique to get seated on edge of bed while adhering to posterior hip precautions. required step by step instruction. Transfers Sit to Stand: 4: Min assist;From bed;With upper extremity assist Stand to Sit: 4: Min assist;To chair/3-in-1;With upper extremity assist;With armrests Details for Transfer Assistance: min cues for correct/safe hand placement with transfers and for ant wt shifting with standing up. Ambulation/Gait Ambulation/Gait Assistance: 4: Min assist Ambulation Distance (Feet): 20 Feet Assistive device: Rolling walker Ambulation/Gait Assistance Details: mod-max verbal/tactile cues for posture, walker use/position with gait, to increase left step lenght and to increase left foot clearance with swing phase (demo's decreased hip/knee flexion and  decreased DF), and assist for right lateral wt shifting with right stance phase.          Gait Pattern: Step-to pattern;Decreased step length - left;Decreased stance time - right;Decreased hip/knee flexion - left;Decreased dorsiflexion - left;Antalgic;Trunk flexed;Decreased weight shift to right    Exercises Total Joint Exercises Ankle Circles/Pumps: AROM;Both;10 reps;Supine Quad Sets: AROM;Strengthening;Right;10 reps;Supine Heel Slides: AAROM;Strengthening;Right;10 reps;Supine Hip ABduction/ADduction: AAROM;Strengthening;Right;10 reps;Supine    PT Goals Acute Rehab PT Goals PT Goal: Supine/Side to Sit - Progress: Progressing toward goal PT Goal: Sit to Stand - Progress: Progressing toward goal PT Goal: Stand to Sit - Progress: Progressing toward goal PT Transfer Goal: Bed to Chair/Chair to Bed - Progress: Progressing toward goal PT Goal: Ambulate - Progress: Progressing toward goal PT Goal: Perform Home Exercise Program - Progress: Progressing toward goal Additional Goals PT Goal: Additional Goal #1 - Progress: Progressing toward goal  Visit Information  Last PT Received On: 04/25/12 Assistance Needed: +1    Subjective Data  Subjective: Reports feeling "stiff" in right hip, no pain currently.   Cognition  Overall Cognitive Status: Appears within functional limits for tasks assessed/performed Arousal/Alertness: Awake/alert Orientation Level: Appears intact for tasks assessed Behavior During Session: [facility] for tasks performed       End of Session PT - End of Session Equipment Utilized During Treatment: Gait belt Activity Tolerance: Patient tolerated treatment well;Patient limited by pain Patient left: in chair;with call bell/phone within reach Nurse Communication: Mobility status   GP     Sallyanne Kuster 04/25/2012, 12:02 PM  Sallyanne Kuster, PTA Office- [phone]

## 2012-04-25 NOTE — Progress Notes (Signed)
Physical Therapy Treatment Patient Details Name: Alejandra [name] MRN: [id number] DOB: [birth year] Today's Date: 04/25/2012 Time: 9147-8295 PT Time Calculation (min): 28 min  PT Assessment / Plan / Recommendation Comments on Treatment Session  Pt continues to require instruction for hip precautions and mobility continues to be limited by hip pain.     Follow Up Recommendations  Skilled nursing facility    Barriers to Discharge        Equipment Recommendations  Defer to next venue    Recommendations for Other Services    Frequency 7X/week   Plan Discharge plan remains appropriate;Frequency remains appropriate    Precautions / Restrictions Precautions Precautions: Posterior Hip Precaution Booklet Issued: Yes (comment) Precaution Comments: Re-educated pt on 3/3 posterior hip precautions as pt only able to recall hip adduction.  Restrictions Weight Bearing Restrictions: Yes RLE Weight Bearing: Weight bearing as tolerated   Pertinent Vitals/Pain Pt reports no pain at rest.  C/o pain in hip with activity unable to rate no pain intervention required.     Mobility  Bed Mobility Bed Mobility: Sit to Supine Sit to Supine: 3: Mod assist;HOB flat Details for Bed Mobility Assistance: Assist for bilateral LEs and cueing for technique.  Transfers Transfers: Sit to Stand;Stand to [employer] Transfers Sit to Stand: 4: Min assist;With upper extremity assist;From chair/3-in-1;With armrests Stand to Sit: 4: Min assist;To bed;With upper extremity assist Stand Pivot Transfers: 3: Mod assist Details for Transfer Assistance: Assist to initiate standing and control descent to bed.  Cueing for hand placement for both.  Step by step cueing for stand pivot transfer.  Pt sliding L LE instead of lifting it during swing phase despite repeated cueing by PT.   Ambulation/Gait Ambulation/Gait Assistance: 3: Mod assist Ambulation Distance (Feet): 5 Feet Assistive device: Rolling walker Ambulation/Gait  Assistance Details:  Pt sliding L LE instead of lifting it during swing phase despite repeated cueing by PT.  Gait Pattern: Step-to pattern;Decreased step length - left;Decreased stance time - right;Decreased hip/knee flexion - left;Decreased dorsiflexion - left;Antalgic;Trunk flexed;Decreased weight shift to right Stairs: No Wheelchair Mobility Wheelchair Mobility: No    Exercises     PT Diagnosis:    PT Problem List:   PT Treatment Interventions:     PT Goals Acute Rehab PT Goals PT Goal Formulation: With patient Time For Goal Achievement: 05/01/12 Potential to Achieve Goals: Good Pt will go Supine/Side to Sit: with supervision PT Goal: Supine/Side to Sit - Progress: Not met Pt will go Sit to Supine/Side: with supervision PT Goal: Sit to Supine/Side - Progress: Not met Pt will go Sit to Stand: with supervision PT Goal: Sit to Stand - Progress: Not met Pt will go Stand to Sit: with supervision PT Goal: Stand to Sit - Progress: Not met Pt will Transfer Bed to Chair/Chair to Bed: with supervision PT Transfer Goal: Bed to Chair/Chair to Bed - Progress: Not met Pt will Ambulate: 51 - 150 feet;with supervision PT Goal: Ambulate - Progress: Not met Pt will Perform Home Exercise Program: with supervision, verbal cues required/provided PT Goal: Perform Home Exercise Program - Progress: Not met Additional Goals Additional Goal #1: Pt will verbalize and demonstrate 3/3 posterior hip precautions.  PT Goal: Additional Goal #1 - Progress: Not met  Visit Information  Last PT Received On: 04/25/12 Assistance Needed: +1    Subjective Data  Subjective: [facility] Patient Stated Goal: Walk without pain.    Cognition  Overall Cognitive Status: Appears within functional limits  for tasks assessed/performed Arousal/Alertness: Awake/alert Orientation Level: Appears intact for tasks assessed Behavior During Session: [facility] for tasks performed    Balance     End of  Session PT - End of Session Equipment Utilized During Treatment: Gait belt Activity Tolerance: Patient tolerated treatment well;Patient limited by pain Patient left: in bed;with call bell/phone within reach;with family/visitor present Nurse Communication: Mobility status   GP     Greogry Goodwyn 04/25/2012, 6:48 PM Munachimso Palin L. Mahli Glahn DPT [phone]

## 2012-04-25 NOTE — Progress Notes (Signed)
ANTICOAGULATION CONSULT NOTE   Pharmacy Consult for Coumadin Indication: VTE prophylaxis  No Known Allergies  Patient Measurements: Height: 5' 1.02" (155 cm) Weight: 132 lb 11.5 oz (60.2 kg) IBW/kg (Calculated) : 47.86   Vital Signs: Temp: 98.1 F (36.7 C) (09/12 0600) BP: 143/83 mmHg (09/12 0600) Pulse Rate: 95  (09/12 0600)  Labs:  Basename 04/25/12 0516 04/24/12 0536 04/23/12 1603 04/23/12 1007  HGB 11.0* 11.1* -- --  HCT 33.0* 33.6* 29.0* --  PLT 648* 583* -- 766*  APTT -- -- -- 32  LABPROT 34.1* 17.8* -- 17.1*  INR 3.31* 1.44 -- 1.37  HEPARINUNFRC -- -- -- --  CREATININE 0.70 0.69 -- --  CKTOTAL -- -- -- --  CKMB -- -- -- --  TROPONINI -- -- -- --    Estimated Creatinine Clearance: 53 ml/min (by C-G formula based on Cr of 0.7).   Medications:  Scheduled:     . acetaminophen  1,000 mg Intravenous Q6H  . amLODipine  10 mg Oral Daily  . aspirin EC  81 mg Oral Daily  . benazepril  40 mg Oral Daily  . calcium-vitamin D  1 tablet Oral Daily  . coumadin book   Does not apply Once  . docusate sodium  100 mg Oral BID  . dorzolamide-timolol  1 drop Both Eyes BID  . enoxaparin (LOVENOX) injection  40 mg Subcutaneous Q24H  . hydroxyurea  500 mg Oral BID  . senna  1 tablet Oral BID  . warfarin  5 mg Oral ONCE-1800  . warfarin   Does not apply Once  . Warfarin - Pharmacist Dosing Inpatient   Does not apply q1800    Assessment: 71 yr old female admitted to have hardware removed from her rt hip (placed following fractured hip).  INR is 3.21 on day 3 of coumadin and also noted on lovenox 40mg  [place] daily. No major drug interactions noted, ? possible lab error  Goal of Therapy:  INR 2-3 Monitor platelets by anticoagulation protocol: Yes   Plan:  -Hold coumadin tonight. -Daily PT/INR  Harland German, Pharm D 04/25/2012 8:07 AM

## 2012-04-25 NOTE — Discharge Summary (Signed)
Physician Discharge Summary  [name] MRN: [id number] DOB/AGE: [birth year] 71 y.o.  Admit date: 04/23/2012 Discharge date: 04/25/2012  Admission Diagnoses:  Avascular necrosis of femur head, right  Discharge Diagnoses:  Principal Problem:  *Avascular necrosis of femur head, right Active Problems:  Thrombocytosis   Past Medical History  Diagnosis Date  . Arthritis   . Glaucoma   . Hypertension   . Fracture 10/05/11    "fell and broke right hip"  . Fracture of femoral neck, right 10/12/2011  . Glaucoma     bilateral  . Polycythemia vera   . Avascular necrosis of femur head, right 04/23/2012  . Thrombocytosis 04/23/2012    Surgeries: Procedure(s): HARDWARE REMOVAL ARTHROPLASTY BIPOLAR HIP on 04/23/2012   Consultants (if any):    Discharged Condition: Improved  Hospital Course: Alejandra [name] is an 71 y.o. female who was admitted 04/23/2012 with a diagnosis of Avascular necrosis of femur head, right and went to the operating room on 04/23/2012 and underwent the above named procedures.    She was given perioperative antibiotics:  Anti-infectives     Start     Dose/Rate Route Frequency Ordered Stop   04/23/12 2130   ceFAZolin (ANCEF) IVPB 2 g/50 mL premix        2 g 100 mL/hr over 30 Minutes Intravenous Every 6 hours 04/23/12 2052 04/24/12 0353   04/22/12 1416   ceFAZolin (ANCEF) IVPB 2 g/50 mL premix  Status:  Discontinued        2 g 100 mL/hr over 30 Minutes Intravenous 60 min pre-op 04/22/12 1416 04/23/12 2018        .  She was given sequential compression devices, early ambulation, and Lovenox bridging to Coumadin for DVT prophylaxis. She did not require a blood transfusion  She benefited maximally from the hospital stay and there were no complications.    Recent vital signs:  Filed Vitals:   04/25/12 0800  BP:   Pulse:   Temp:   Resp: 18    Recent laboratory studies:  Lab Results  Component Value Date   HGB 11.0* 04/25/2012   HGB 11.1*  04/24/2012   HGB 9.9* 04/23/2012   Lab Results  Component Value Date   WBC 8.9 04/25/2012   PLT 648* 04/25/2012   Lab Results  Component Value Date   INR 3.31* 04/25/2012   Lab Results  Component Value Date   NA 137 04/25/2012   K 4.1 04/25/2012   CL 102 04/25/2012   CO2 26 04/25/2012   BUN 11 04/25/2012   CREATININE 0.70 04/25/2012   GLUCOSE 95 04/25/2012    Discharge Medications:     Medication List     As of 04/25/2012  1:57 PM    TAKE these medications         ALPRAZolam 0.25 MG tablet   Commonly known as: XANAX   Take 0.25 mg by mouth 2 (two) times daily as needed.      amLODipine 10 MG tablet   Commonly known as: NORVASC   Take 10 mg by mouth daily.      aspirin EC 81 MG tablet   Take 81 mg by mouth daily.      benazepril 40 MG tablet   Commonly known as: LOTENSIN   Take 40 mg by mouth daily.      bisacodyl 5 MG EC tablet   Commonly known as: DULCOLAX   Take 1 tablet (5 mg total) by mouth daily as needed for  constipation.      calcium-vitamin D 500-200 MG-UNIT per tablet   Commonly known as: OSCAL WITH D   Take 1 tablet by mouth daily.      dorzolamide-timolol 22.3-6.8 MG/ML ophthalmic solution   Commonly known as: COSOPT   Place 1 drop into both eyes 2 (two) times daily.      HYDROcodone-acetaminophen 10-325 MG per tablet   Commonly known as: NORCO   Take 1-2 tablets by mouth every 6 (six) hours as needed for pain.      hydroxyurea 500 MG capsule   Commonly known as: HYDREA   Take 500 mg by mouth 2 (two) times daily. May take with food to minimize GI side effects.      sennosides-docusate sodium 8.6-50 MG tablet   Commonly known as: SENOKOT-S   Take 1 tablet by mouth daily.      traMADol 50 MG tablet   Commonly known as: ULTRAM   Take 50 mg by mouth every 6 (six) hours as needed. For pain.      warfarin 5 MG tablet   Commonly known as: COUMADIN   Take 1 tablet (5 mg total) by mouth daily.        Diagnostic Studies: Dg Pelvis  Portable  04/23/2012  *RADIOLOGY REPORT*  Clinical Data: Postoperative exam after right hip total arthroplasty  PORTABLE PELVIS  Comparison: 10/13/2011  Findings: Interval placement of right total hip arthroplasty noted. Subcutaneous gas is compatible with recent surgery.  Allowing for patient positioning angulation, femoral component appears properly located.  No evidence for hardware failure or fracture line.  IMPRESSION: Expected postoperative appearance after right hip total arthroplasty.   Original Report Authenticated By: Harrel Lemon, M.D.    Dg Hip Portable 1 View Left  04/23/2012  *RADIOLOGY REPORT*  Clinical Data: Postop right total hip.  PORTABLE LEFT HIP - 1 VIEW  Comparison: AP view of the pelvis portable 04/23/2012  Findings: Pain cross-table lateral view performed portably shows postoperative changes of the right hip arthroplasty.  Acetabulum is difficult to visualize given the overlying soft tissues.  No gross evidence of dislocation.  Subcutaneous gas is noted.  No evidence of periprosthetic fracture.  IMPRESSION: Immediate postoperative changes of right hip arthroplasty.  Although evaluation of the acetabula is limited, the arthroplasty appears located.  No hardware complication identified.   Original Report Authenticated By: Britta Mccreedy, M.D.     Disposition: Skilled nursing facility      Discharge Orders    Future Orders Please Complete By Expires   Diet general      Weight bearing as tolerated      Call MD / Call 911      Comments:   If you experience chest pain or shortness of breath, CALL 911 and be transported to the hospital emergency room.  If you develope a fever above 101 F, pus (white drainage) or increased drainage or redness at the wound, or calf pain, call your surgeon's office.   Discharge instructions      Comments:   Change dressing in 3 days and reapply fresh dressing, unless you have a splint (half cast).  If you have a splint/cast, just leave in place  until your follow-up appointment.    Keep wounds dry for 3 weeks.  Leave steri-strips in place on skin.  Do not apply lotion or anything to the wound.   Constipation Prevention      Comments:   Drink plenty of fluids.  Prune juice may be  helpful.  You may use a stool softener, such as Colace (over the counter) 100 mg twice a day.  Use MiraLax (over the counter) for constipation as needed.   Follow the hip precautions as taught in Physical Therapy      Change dressing      Comments:   You may change your dressing in 3 days, then change the dressing daily with sterile 4 x 4 inch gauze dressing and paper tape.  You may clean the incision with alcohol prior to redressing   TED hose      Comments:   Use stockings (TED hose) for 2 weeks on both leg(s).  You may remove them at night for sleeping.   Posterior total hip precautions         Follow-up Information    Follow up with Naysha Sholl P, MD in 2 weeks.   Contact information:   [facility] [address] [place] [address] [phone]           Signed: Eulas Post 04/25/2012, 1:57 PM

## 2012-05-09 ENCOUNTER — Telehealth: Payer: Self-pay | Admitting: *Deleted

## 2012-05-09 NOTE — Telephone Encounter (Signed)
Scheduled for surgery per [facility] 05/14/12 and require medical clearance of Dr. Truett Perna regarding her P. Vera/thrombocytosis. Script for clearance faxed-signed by Dr. Truett Perna "Clear for surgery from medical oncology standpoint".

## 2012-06-26 ENCOUNTER — Telehealth: Payer: Self-pay | Admitting: Oncology

## 2012-06-26 NOTE — Telephone Encounter (Signed)
Pt called and wants an appt with MD, pt scheduled 07/08/12, pt aware of appt , nurse notified

## 2012-07-08 ENCOUNTER — Other Ambulatory Visit (HOSPITAL_BASED_OUTPATIENT_CLINIC_OR_DEPARTMENT_OTHER): Payer: Medicare Other | Admitting: Lab

## 2012-07-08 ENCOUNTER — Ambulatory Visit (HOSPITAL_BASED_OUTPATIENT_CLINIC_OR_DEPARTMENT_OTHER): Payer: Medicare Other | Admitting: Oncology

## 2012-07-08 ENCOUNTER — Telehealth: Payer: Self-pay | Admitting: Oncology

## 2012-07-08 VITALS — BP 120/71 | HR 79 | Temp 98.5°F | Resp 20 | Ht 61.0 in | Wt 134.5 lb

## 2012-07-08 DIAGNOSIS — D473 Essential (hemorrhagic) thrombocythemia: Secondary | ICD-10-CM

## 2012-07-08 DIAGNOSIS — Z5181 Encounter for therapeutic drug level monitoring: Secondary | ICD-10-CM

## 2012-07-08 DIAGNOSIS — D45 Polycythemia vera: Secondary | ICD-10-CM

## 2012-07-08 DIAGNOSIS — E7800 Pure hypercholesterolemia, unspecified: Secondary | ICD-10-CM

## 2012-07-08 LAB — CBC & DIFF AND RETIC
Basophils Absolute: 0.2 10*3/uL — ABNORMAL HIGH (ref 0.0–0.1)
EOS%: 2.8 % (ref 0.0–7.0)
Eosinophils Absolute: 0.3 10*3/uL (ref 0.0–0.5)
HCT: 39 % (ref 34.8–46.6)
HGB: 12 g/dL (ref 11.6–15.9)
MCH: 28.3 pg (ref 25.1–34.0)
MCV: 92 fL (ref 79.5–101.0)
MONO%: 6.1 % (ref 0.0–14.0)
NEUT#: 7.2 10*3/uL — ABNORMAL HIGH (ref 1.5–6.5)
NEUT%: 77.4 % — ABNORMAL HIGH (ref 38.4–76.8)
Platelets: 900 10*3/uL — ABNORMAL HIGH (ref 145–400)
RDW: 19.8 % — ABNORMAL HIGH (ref 11.2–14.5)

## 2012-07-08 LAB — RETICULOCYTES (CHCC)
ABS Retic: 55.3 10*3/uL (ref 19.0–186.0)
RBC.: 4.25 MIL/uL (ref 3.87–5.11)
Retic Ct Pct: 1.3 % (ref 0.4–2.3)

## 2012-07-08 LAB — LIPID PANEL
Cholesterol: 136 mg/dL (ref 0–200)
Triglycerides: 62 mg/dL (ref <150)
VLDL: 12 mg/dL (ref 0–40)

## 2012-07-08 LAB — COMPREHENSIVE METABOLIC PANEL (CC13)
Albumin: 3.5 g/dL (ref 3.5–5.0)
BUN: 19 mg/dL (ref 7.0–26.0)
CO2: 31 mEq/L — ABNORMAL HIGH (ref 22–29)
Glucose: 123 mg/dl — ABNORMAL HIGH (ref 70–99)
Sodium: 140 mEq/L (ref 136–145)
Total Bilirubin: 0.58 mg/dL (ref 0.20–1.20)
Total Protein: 7.6 g/dL (ref 6.4–8.3)

## 2012-07-08 LAB — TECHNOLOGIST REVIEW

## 2012-07-08 NOTE — Progress Notes (Signed)
   [facility]    OFFICE PROGRESS NOTE   INTERVAL HISTORY:   She returns as scheduled. She underwent a right hip replacement on 04/23/2012. Ms. Vicario reports an uneventful operative recovery. She continues hydroxyurea. No bleeding. No symptoms of thrombosis. She stayed in a rehabilitation facility for approximately 30 days and has returned home.  Objective:  Vital signs in last 24 hours:  Blood pressure 120/71, pulse 79, temperature 98.5 F (36.9 C), temperature source Oral, resp. rate 20, height 5\' 1"  (1.549 m), weight 134 lb 8 oz (61.009 kg).    HEENT: No thrush or ulcers Resp: Lungs clear bilaterally Cardio: Regular rate and rhythm GI: No hepatosplenomegaly Vascular: No leg edema   Lab Results:  Lab Results  Component Value Date   WBC 9.2 07/08/2012   HGB 12.0 07/08/2012   HCT 39.0 07/08/2012   MCV 92.0 07/08/2012   PLT 900* 07/08/2012   ANC 7.2    Medications: I have reviewed the patient's current medications.  Assessment/Plan: 1. Polycythemia vera. She continues hydroxyurea 500 mg twice daily. She completed a series of 4 phlebotomy treatments on 03/08/2012 and the hemoglobin/hematocrit are now in the goal range. 2. Hypertension, followed by Dr. Shana Chute.  3. History of hyperpigmentation at the hands. Question related to hydroxyurea. 4. Hip fracture February 2013. She underwent a bipolar hip arthroplasty on 04/23/2012 5. Thrombocytosis-? Secondary polycythemia vera or recent surgery, the platelet count has been intermittently elevated in the past.   Disposition:  Ms. Doerner will continue hydroxyurea. The hemoglobin/hematocrit are in the goal range at present, likely in part related to the recent hip surgery. The platelets have been intermittently elevated in the past. I do not recommend increasing the hydroxyurea dose at present. She will continue aspirin. Ms. Sosinski will return for a CBC in 6 weeks. She is scheduled for a 3 month office  visit.   Thornton Papas, MD  07/08/2012  6:30 PM

## 2012-07-08 NOTE — Telephone Encounter (Signed)
gv and printed pt appt schedule for Jan and Feb 2014

## 2012-08-19 ENCOUNTER — Telehealth: Payer: Self-pay | Admitting: *Deleted

## 2012-08-19 ENCOUNTER — Other Ambulatory Visit: Payer: Medicare Other | Admitting: Lab

## 2012-08-19 NOTE — Telephone Encounter (Signed)
Per patient request I have rescheduled her lab appt from today to next week.  JMW

## 2012-08-26 ENCOUNTER — Other Ambulatory Visit (HOSPITAL_BASED_OUTPATIENT_CLINIC_OR_DEPARTMENT_OTHER): Payer: Medicare Other

## 2012-08-26 DIAGNOSIS — D45 Polycythemia vera: Secondary | ICD-10-CM

## 2012-08-26 LAB — CBC WITH DIFFERENTIAL/PLATELET
BASO%: 1.5 % (ref 0.0–2.0)
Eosinophils Absolute: 0.2 10*3/uL (ref 0.0–0.5)
HCT: 41.1 % (ref 34.8–46.6)
MCHC: 31.4 g/dL — ABNORMAL LOW (ref 31.5–36.0)
MONO#: 0.3 10*3/uL (ref 0.1–0.9)
NEUT#: 5 10*3/uL (ref 1.5–6.5)
RBC: 4.8 10*6/uL (ref 3.70–5.45)
WBC: 6.4 10*3/uL (ref 3.9–10.3)
lymph#: 0.9 10*3/uL (ref 0.9–3.3)

## 2012-08-28 ENCOUNTER — Telehealth: Payer: Self-pay | Admitting: *Deleted

## 2012-08-28 NOTE — Telephone Encounter (Signed)
Called pt with lab results. She confirms she is taking Hydrea 1,000 mg daily. Understands to continue same dose. Next appt confirmed.

## 2012-08-28 NOTE — Telephone Encounter (Signed)
Message copied by Caleb Popp on Wed Aug 28, 2012  8:18 AM ------      Message from: Ladene Artist      Created: Tue Aug 27, 2012  9:35 PM       Please call patient, platelets are better, cont. Same hydrea

## 2012-10-07 ENCOUNTER — Other Ambulatory Visit (HOSPITAL_BASED_OUTPATIENT_CLINIC_OR_DEPARTMENT_OTHER): Payer: Medicare Other | Admitting: Lab

## 2012-10-07 ENCOUNTER — Ambulatory Visit (HOSPITAL_BASED_OUTPATIENT_CLINIC_OR_DEPARTMENT_OTHER): Payer: Medicare Other | Admitting: Nurse Practitioner

## 2012-10-07 VITALS — BP 149/90 | HR 77 | Temp 98.3°F | Resp 18 | Ht 61.0 in | Wt 141.4 lb

## 2012-10-07 DIAGNOSIS — D45 Polycythemia vera: Secondary | ICD-10-CM

## 2012-10-07 DIAGNOSIS — I10 Essential (primary) hypertension: Secondary | ICD-10-CM

## 2012-10-07 DIAGNOSIS — D473 Essential (hemorrhagic) thrombocythemia: Secondary | ICD-10-CM

## 2012-10-07 LAB — CBC WITH DIFFERENTIAL/PLATELET
Basophils Absolute: 0.1 10*3/uL (ref 0.0–0.1)
Eosinophils Absolute: 0.2 10*3/uL (ref 0.0–0.5)
HCT: 40.9 % (ref 34.8–46.6)
HGB: 12.7 g/dL (ref 11.6–15.9)
MONO#: 0.4 10*3/uL (ref 0.1–0.9)
NEUT%: 78.2 % — ABNORMAL HIGH (ref 38.4–76.8)
WBC: 7.9 10*3/uL (ref 3.9–10.3)
lymph#: 1 10*3/uL (ref 0.9–3.3)

## 2012-10-07 NOTE — Progress Notes (Signed)
OFFICE PROGRESS NOTE  Interval history:  Alejandra [name] returns as scheduled. She continues Hydrea 500 mg twice daily. She denies nausea/vomiting. No rash. No leg swelling or calf pain. No shortness of breath or chest pain. She denies bleeding. She notes the right hip is "stiff" in the mornings. This improves with activity. She has a good appetite. She is gaining weight.   Objective: Blood pressure 149/90, pulse 77, temperature 98.3 F (36.8 C), temperature source Oral, resp. rate 18, height 5\' 1"  (1.549 m), weight 141 lb 6.4 oz (64.139 kg).  Oropharynx is without thrush or ulceration. No palpable cervical or supraclavicular lymph nodes. Lungs are clear. Regular cardiac rhythm. Abdomen is soft and nontender. No organomegaly. Extremities are without edema. Calves are soft and nontender.  Lab Results: Lab Results  Component Value Date   WBC 7.9 10/07/2012   HGB 12.7 10/07/2012   HCT 40.9 10/07/2012   MCV 83.3 10/07/2012   PLT 529* 10/07/2012    Chemistry:    Chemistry      Component Value Date/Time   NA 140 07/08/2012 1428   NA 137 04/25/2012 0516   K 4.8 07/08/2012 1428   K 4.1 04/25/2012 0516   CL 105 07/08/2012 1428   CL 102 04/25/2012 0516   CO2 31* 07/08/2012 1428   CO2 26 04/25/2012 0516   BUN 19.0 07/08/2012 1428   BUN 11 04/25/2012 0516   CREATININE 1.1 07/08/2012 1428   CREATININE 0.70 04/25/2012 0516      Component Value Date/Time   CALCIUM 9.8 07/08/2012 1428   CALCIUM 9.2 04/25/2012 0516   ALKPHOS 93 07/08/2012 1428   ALKPHOS 77 02/08/2012 1024   AST 18 07/08/2012 1428   AST 16 02/08/2012 1024   ALT 12 07/08/2012 1428   ALT 13 02/08/2012 1024   BILITOT 0.58 07/08/2012 1428   BILITOT 0.9 02/08/2012 1024       Studies/Results: No results found.  Medications: I have reviewed the patient's current medications.  Assessment/Plan:  1. Polycythemia vera. She continues hydroxyurea 500 mg twice daily. She completed a series of 4 phlebotomy treatments on 03/08/2012 and the  hemoglobin/hematocrit are now in the goal range. 2. Hypertension, followed by Dr. Shana Chute.  3. History of hyperpigmentation at the hands. Question related to hydroxyurea. 4. Hip fracture February 2013. She underwent a bipolar hip arthroplasty on 04/23/2012. 5. Thrombocytosis. Question secondary to polycythemia vera.  Disposition-[name]'s hemoglobin/hematocrit remain in the goal range. The platelet count is better. She will continue hydroxyurea at the current dose. She will return for a CBC in 6 weeks and a followup visit in 3 months. She will contact the office in the interim with any problems.  Plan reviewed with Dr. Truett Perna.  Alejandra [name] ANP/GNP-BC

## 2012-10-08 ENCOUNTER — Telehealth: Payer: Self-pay | Admitting: Oncology

## 2012-10-08 NOTE — Telephone Encounter (Signed)
S/W THE PT AND SHE IS AWARE OF HER April AND MAY 2014 APPTS

## 2012-11-18 ENCOUNTER — Other Ambulatory Visit (HOSPITAL_BASED_OUTPATIENT_CLINIC_OR_DEPARTMENT_OTHER): Payer: Medicare Other | Admitting: Lab

## 2012-11-18 ENCOUNTER — Other Ambulatory Visit: Payer: Self-pay | Admitting: *Deleted

## 2012-11-18 DIAGNOSIS — D45 Polycythemia vera: Secondary | ICD-10-CM

## 2012-11-18 LAB — CBC WITH DIFFERENTIAL/PLATELET
Basophils Absolute: 0.2 10*3/uL — ABNORMAL HIGH (ref 0.0–0.1)
EOS%: 2.6 % (ref 0.0–7.0)
HCT: 42.8 % (ref 34.8–46.6)
HGB: 13 g/dL (ref 11.6–15.9)
MCH: 23.2 pg — ABNORMAL LOW (ref 25.1–34.0)
MCV: 76.6 fL — ABNORMAL LOW (ref 79.5–101.0)
MONO%: 6.7 % (ref 0.0–14.0)
NEUT%: 77.6 % — ABNORMAL HIGH (ref 38.4–76.8)
Platelets: 820 10*3/uL — ABNORMAL HIGH (ref 145–400)
lymph#: 1.2 10*3/uL (ref 0.9–3.3)

## 2012-11-19 ENCOUNTER — Telehealth: Payer: Self-pay | Admitting: *Deleted

## 2012-11-19 DIAGNOSIS — D45 Polycythemia vera: Secondary | ICD-10-CM

## 2012-11-19 NOTE — Telephone Encounter (Signed)
Confirmed she has been taking Hydrea 1000 mg daily. Gave her new instructions to continue this, except on Monday & Thursday take 1500 mg. Will recheck at next visit. She understands and agrees. Was able to repeat back to nurse.

## 2012-11-19 NOTE — Telephone Encounter (Signed)
Message copied by Wandalee Ferdinand on Tue Nov 19, 2012  6:30 PM ------      Message from: Thornton Papas B      Created: Mon Nov 18, 2012  9:45 PM       Please call patient, hb and platelets are higher, increase hydrea to 1500mg  on mon. And Thurs.      Cbc, cmet next visit ------

## 2012-11-25 ENCOUNTER — Other Ambulatory Visit: Payer: Self-pay | Admitting: *Deleted

## 2012-11-25 MED ORDER — HYDROCODONE-ACETAMINOPHEN 10-325 MG PO TABS: ORAL_TABLET | ORAL | Status: DC

## 2012-12-30 ENCOUNTER — Ambulatory Visit (HOSPITAL_BASED_OUTPATIENT_CLINIC_OR_DEPARTMENT_OTHER): Payer: Medicare Other | Admitting: Oncology

## 2012-12-30 ENCOUNTER — Other Ambulatory Visit (HOSPITAL_BASED_OUTPATIENT_CLINIC_OR_DEPARTMENT_OTHER): Payer: Medicare Other | Admitting: Lab

## 2012-12-30 ENCOUNTER — Telehealth: Payer: Self-pay | Admitting: Oncology

## 2012-12-30 VITALS — BP 168/90 | HR 73 | Temp 97.6°F | Resp 18 | Ht 61.0 in | Wt 145.7 lb

## 2012-12-30 DIAGNOSIS — I10 Essential (primary) hypertension: Secondary | ICD-10-CM

## 2012-12-30 DIAGNOSIS — D45 Polycythemia vera: Secondary | ICD-10-CM

## 2012-12-30 DIAGNOSIS — D473 Essential (hemorrhagic) thrombocythemia: Secondary | ICD-10-CM

## 2012-12-30 LAB — COMPREHENSIVE METABOLIC PANEL (CC13)
Albumin: 3.5 g/dL (ref 3.5–5.0)
BUN: 27.1 mg/dL — ABNORMAL HIGH (ref 7.0–26.0)
Calcium: 9.2 mg/dL (ref 8.4–10.4)
Chloride: 108 mEq/L — ABNORMAL HIGH (ref 98–107)
Creatinine: 0.8 mg/dL (ref 0.6–1.1)
Glucose: 74 mg/dl (ref 70–99)
Potassium: 4.2 mEq/L (ref 3.5–5.1)

## 2012-12-30 LAB — CBC WITH DIFFERENTIAL/PLATELET
Basophils Absolute: 0.2 10*3/uL — ABNORMAL HIGH (ref 0.0–0.1)
EOS%: 3.1 % (ref 0.0–7.0)
HGB: 13 g/dL (ref 11.6–15.9)
MCH: 21.7 pg — ABNORMAL LOW (ref 25.1–34.0)
MCV: 73.1 fL — ABNORMAL LOW (ref 79.5–101.0)
MONO%: 6.6 % (ref 0.0–14.0)
RDW: 19.6 % — ABNORMAL HIGH (ref 11.2–14.5)

## 2012-12-30 NOTE — Progress Notes (Signed)
   [facility]    OFFICE PROGRESS NOTE   INTERVAL HISTORY:   She returns as scheduled. She continues hydroxyurea. No symptoms of venous or arterial thrombosis. No pruritus. No mouth sores or diarrhea. She complains of "stiffness "at the right "hip ". She has been evaluated by orthopedics. She is drowsy at times during the day. She has been taking hydrocodone for pain.  Objective:  Vital signs in last 24 hours:  Blood pressure 168/90, pulse 73, temperature 97.6 F (36.4 C), temperature source Oral, resp. rate 18, height 5\' 1"  (1.549 m), weight 145 lb 11.2 oz (66.089 kg).    HEENT: No thrush or ulcers Resp: Lungs clear bilaterally Cardio: Regular rate and rhythm GI: No hepatosplenomegaly Vascular: No leg edema      Lab Results:  Lab Results  Component Value Date   WBC 10.9* 12/30/2012   HGB 13.0 12/30/2012   HCT 43.9 12/30/2012   MCV 73.1* 12/30/2012   PLT 986 confirmed* 12/30/2012   ANC 8.3    Medications: I have reviewed the patient's current medications.  Assessment/Plan: 1. Polycythemia vera. She continues hydroxyurea . The hematoma/hematocrit are in the goal range.  2. Hypertension, followed by Dr. Shana Chute.  3. History of hyperpigmentation at the hands. Question related to hydroxyurea. 4. Hip fracture February 2013. She underwent a bipolar hip arthroplasty on 04/23/2012. She continues followup with orthopedics for discomfort in the right hip/femur 5. Thrombocytosis. Question secondary to polycythemia vera.       6.   somnolence-most likely related to hydrocodone   Disposition:  She appears stable from a hematologic standpoint. She reports no history of venous or arterial thrombosis. We will therefore not increase the hydroxyurea dose in an attempt to lower the platelet count. She will return for a CBC in 2 months and a 4 month office visit.   Thornton Papas, MD  12/30/2012  12:43 PM

## 2013-02-27 ENCOUNTER — Other Ambulatory Visit (HOSPITAL_BASED_OUTPATIENT_CLINIC_OR_DEPARTMENT_OTHER): Payer: Medicare Other | Admitting: Lab

## 2013-02-27 DIAGNOSIS — D45 Polycythemia vera: Secondary | ICD-10-CM

## 2013-02-27 LAB — CBC WITH DIFFERENTIAL/PLATELET
Basophils Absolute: 0.1 10*3/uL (ref 0.0–0.1)
EOS%: 3.9 % (ref 0.0–7.0)
Eosinophils Absolute: 0.4 10*3/uL (ref 0.0–0.5)
HGB: 13.6 g/dL (ref 11.6–15.9)
MONO#: 0.4 10*3/uL (ref 0.1–0.9)
NEUT#: 7.3 10*3/uL — ABNORMAL HIGH (ref 1.5–6.5)
RDW: 21.5 % — ABNORMAL HIGH (ref 11.2–14.5)
lymph#: 1.2 10*3/uL (ref 0.9–3.3)

## 2013-03-04 ENCOUNTER — Telehealth: Payer: Self-pay | Admitting: *Deleted

## 2013-03-04 NOTE — Telephone Encounter (Signed)
Message copied by Phillis Knack on Tue Mar 04, 2013  3:56 PM ------      Message from: Drummond, [place] P      Created: Tue Mar 04, 2013  3:33 PM                   ----- Message -----         From: Ladene Artist, MD         Sent: 03/03/2013   9:20 PM           To: Wandalee Ferdinand, RN, Glori Luis, RN, #            Please call patient, hb/hct are ok, f/u as scheduled, we will need to consider phlebotomy if higher next visit ------

## 2013-03-04 NOTE — Telephone Encounter (Signed)
Pt notified of results below 

## 2013-04-28 ENCOUNTER — Ambulatory Visit: Payer: Medicare Other | Admitting: Nurse Practitioner

## 2013-04-29 ENCOUNTER — Telehealth: Payer: Self-pay | Admitting: Oncology

## 2013-04-29 NOTE — Telephone Encounter (Signed)
Pt called to r/s 9/15 appt. Pt given new appt for 9/26 @ 2:15pm. Per pt appt has to be on a Friday.

## 2013-05-09 ENCOUNTER — Ambulatory Visit (HOSPITAL_BASED_OUTPATIENT_CLINIC_OR_DEPARTMENT_OTHER): Payer: Medicare Other | Admitting: Lab

## 2013-05-09 ENCOUNTER — Ambulatory Visit (HOSPITAL_BASED_OUTPATIENT_CLINIC_OR_DEPARTMENT_OTHER): Payer: Medicare Other | Admitting: Nurse Practitioner

## 2013-05-09 VITALS — BP 156/80 | HR 85 | Temp 97.9°F | Resp 18 | Ht 61.0 in | Wt 151.7 lb

## 2013-05-09 DIAGNOSIS — D473 Essential (hemorrhagic) thrombocythemia: Secondary | ICD-10-CM

## 2013-05-09 DIAGNOSIS — D75839 Thrombocytosis, unspecified: Secondary | ICD-10-CM

## 2013-05-09 DIAGNOSIS — D45 Polycythemia vera: Secondary | ICD-10-CM

## 2013-05-09 LAB — CBC WITH DIFFERENTIAL/PLATELET
Basophils Absolute: 0.1 10*3/uL (ref 0.0–0.1)
Eosinophils Absolute: 0.3 10*3/uL (ref 0.0–0.5)
HCT: 46.4 % (ref 34.8–46.6)
HGB: 14 g/dL (ref 11.6–15.9)
MCV: 68.4 fL — ABNORMAL LOW (ref 79.5–101.0)
MONO%: 4.4 % (ref 0.0–14.0)
NEUT#: 9 10*3/uL — ABNORMAL HIGH (ref 1.5–6.5)
Platelets: 880 10*3/uL — ABNORMAL HIGH (ref 145–400)
RDW: 21.5 % — ABNORMAL HIGH (ref 11.2–14.5)

## 2013-05-09 NOTE — Progress Notes (Signed)
OFFICE PROGRESS NOTE  Interval history:  Ms. Kavanaugh is a 72 year old woman with polycythemia vera. She is maintained on Hydrea 500 mg twice daily. She requires intermittent phlebotomy. She is seen today for scheduled followup.  She reports she is feeling well. She continues Hydrea. She denies nausea/vomiting, mouth sores, diarrhea, skin rash. No leg swelling or calf pain. She denies shortness of breath and chest pain. No unusual headaches or vision change. No focal extremity weakness. She denies fevers or sweats. She has a good appetite. No interim illnesses or infections. She continues to have pain related to "arthritis and bursitis".   Objective: Blood pressure 156/80, pulse 85, temperature 97.9 F (36.6 C), temperature source Oral, resp. rate 18, height 5\' 1"  (1.549 m), weight 151 lb 11.2 oz (68.811 kg).  Oropharynx is without thrush or ulceration. No palpable cervical, supraclavicular or axillary lymph nodes. Lungs are clear. No wheezes or rales. Regular cardiac rhythm. Abdomen is soft and nontender. No organomegaly. Extremities are without edema. Calves soft and nontender. Motor strength 5 over 5. She is alert and oriented. No skin rash.   Lab Results: Lab Results  Component Value Date   WBC 9.4 02/27/2013   HGB 13.6 02/27/2013   HCT 44.4 02/27/2013   MCV 69.5* 02/27/2013   PLT 884* 02/27/2013    Chemistry:    Chemistry      Component Value Date/Time   NA 142 12/30/2012 1059   NA 137 04/25/2012 0516   K 4.2 12/30/2012 1059   K 4.1 04/25/2012 0516   CL 108* 12/30/2012 1059   CL 102 04/25/2012 0516   CO2 26 12/30/2012 1059   CO2 26 04/25/2012 0516   BUN 27.1* 12/30/2012 1059   BUN 11 04/25/2012 0516   CREATININE 0.8 12/30/2012 1059   CREATININE 0.70 04/25/2012 0516      Component Value Date/Time   CALCIUM 9.2 12/30/2012 1059   CALCIUM 9.2 04/25/2012 0516   ALKPHOS 118 12/30/2012 1059   ALKPHOS 77 02/08/2012 1024   AST 20 12/30/2012 1059   AST 16 02/08/2012 1024   ALT 16 12/30/2012 1059    ALT 13 02/08/2012 1024   BILITOT 0.42 12/30/2012 1059   BILITOT 0.9 02/08/2012 1024       Studies/Results: No results found.  Medications: I have reviewed the patient's current medications.  Assessment/Plan:  1. Polycythemia vera. She continues hydroxyurea. 2. Hypertension, followed by Dr. Shana Chute.  3. History of hyperpigmentation at the hands. Question related to hydroxyurea. 4. Hip fracture February 2013. She underwent a bipolar hip arthroplasty on 04/23/2012. 5. Thrombocytosis. Question secondary to polycythemia vera.  Disposition-she will continue Hydrea at the current dose. She is returning to the lab today for a current CBC. Based on the CBC from 02/27/2013 we are anticipating she will likely need to begin a phlebotomy series and will go ahead and schedule a phlebotomy on 05/16/2013 and again on 06/06/2013. We will adjust accordingly pending the CBC result from today.  She will return for a followup visit in 3 months. She will contact the office in the interim with any problems.  Plan reviewed with Dr. Truett Perna.   Lonna Cobb ANP/GNP-BC

## 2013-05-13 ENCOUNTER — Telehealth: Payer: Self-pay | Admitting: Oncology

## 2013-05-13 NOTE — Telephone Encounter (Signed)
S/w pt re appts for 10/3, 10/24 and 12/30. Pt will get new schedule when she comes in 10/3.

## 2013-05-16 ENCOUNTER — Other Ambulatory Visit (HOSPITAL_BASED_OUTPATIENT_CLINIC_OR_DEPARTMENT_OTHER): Payer: Medicare Other | Admitting: Lab

## 2013-05-16 ENCOUNTER — Ambulatory Visit (HOSPITAL_BASED_OUTPATIENT_CLINIC_OR_DEPARTMENT_OTHER): Payer: Medicare Other

## 2013-05-16 DIAGNOSIS — D473 Essential (hemorrhagic) thrombocythemia: Secondary | ICD-10-CM

## 2013-05-16 DIAGNOSIS — D45 Polycythemia vera: Secondary | ICD-10-CM

## 2013-05-16 DIAGNOSIS — D75839 Thrombocytosis, unspecified: Secondary | ICD-10-CM

## 2013-05-16 LAB — CBC WITH DIFFERENTIAL/PLATELET
Basophils Absolute: 0.1 10*3/uL (ref 0.0–0.1)
Eosinophils Absolute: 0.3 10*3/uL (ref 0.0–0.5)
LYMPH%: 11.9 % — ABNORMAL LOW (ref 14.0–49.7)
MCV: 69.1 fL — ABNORMAL LOW (ref 79.5–101.0)
MONO%: 5 % (ref 0.0–14.0)
NEUT#: 8.5 10*3/uL — ABNORMAL HIGH (ref 1.5–6.5)
Platelets: 569 10*3/uL — ABNORMAL HIGH (ref 145–400)
RBC: 6.51 10*6/uL — ABNORMAL HIGH (ref 3.70–5.45)

## 2013-05-16 NOTE — Progress Notes (Signed)
OK to treat with HCT 45 per Lonna Cobb.    Phlebotomy started at 1445.  At 1515 - pt c/o feeling whoozy "like she was going to pass out".  Phlebotomy stopped at 340 mL.  Per Lonna Cobb, ok to discontinue phlebotomy and give patient 250 mL IVF.  If stable, patient can be discharged after fluids.  15:55 - VS - see CHL. Pt discharged to home with no complaints.

## 2013-05-22 ENCOUNTER — Telehealth: Payer: Self-pay | Admitting: *Deleted

## 2013-05-22 DIAGNOSIS — D45 Polycythemia vera: Secondary | ICD-10-CM

## 2013-05-22 NOTE — Telephone Encounter (Signed)
Call from pt asking if she should be scheduled for phlebotomy 10/10? Had half unit phlebotomy drawn last week. Reviewed with Dr. Truett Perna: Yes, half unit phlebotomy on 10/10. Orders entered for schedulers to contact pt.

## 2013-05-23 ENCOUNTER — Other Ambulatory Visit: Payer: Medicare Other | Admitting: Lab

## 2013-05-23 ENCOUNTER — Telehealth: Payer: Self-pay | Admitting: Oncology

## 2013-05-23 ENCOUNTER — Telehealth: Payer: Self-pay | Admitting: *Deleted

## 2013-05-23 NOTE — Telephone Encounter (Signed)
Spoke with pt, she is unable to come in for phlebotomy today. Requests appt 10/17 around 1:15 due to transportation. Request sent to schedulers.

## 2013-05-23 NOTE — Telephone Encounter (Signed)
called Marcelino Duster regarding phlebotomy on 10/17 with labs, waiting for appt

## 2013-05-27 ENCOUNTER — Telehealth: Payer: Self-pay | Admitting: Oncology

## 2013-05-27 ENCOUNTER — Telehealth: Payer: Self-pay | Admitting: *Deleted

## 2013-05-27 NOTE — Telephone Encounter (Signed)
Talked to pt and gave her appt for lab and phlebotomy on 10/17 advisd pt to get appt calendar for october

## 2013-05-27 NOTE — Telephone Encounter (Signed)
Called Marcelino Duster left message for phlebotomy this Friday

## 2013-05-27 NOTE — Telephone Encounter (Signed)
Pr staff message and POF I have scheduled appt

## 2013-05-30 ENCOUNTER — Other Ambulatory Visit (HOSPITAL_BASED_OUTPATIENT_CLINIC_OR_DEPARTMENT_OTHER): Payer: Medicare Other

## 2013-05-30 ENCOUNTER — Ambulatory Visit: Payer: Medicare Other

## 2013-05-30 DIAGNOSIS — D45 Polycythemia vera: Secondary | ICD-10-CM

## 2013-05-30 LAB — CBC WITH DIFFERENTIAL/PLATELET
BASO%: 0.4 % (ref 0.0–2.0)
Basophils Absolute: 0 10*3/uL (ref 0.0–0.1)
Eosinophils Absolute: 0.4 10*3/uL (ref 0.0–0.5)
HCT: 43.6 % (ref 34.8–46.6)
HGB: 12.8 g/dL (ref 11.6–15.9)
LYMPH%: 14 % (ref 14.0–49.7)
MCHC: 29.5 g/dL — ABNORMAL LOW (ref 31.5–36.0)
MCV: 68.6 fL — ABNORMAL LOW (ref 79.5–101.0)
MONO#: 0.5 10*3/uL (ref 0.1–0.9)
MONO%: 3.9 % (ref 0.0–14.0)
NEUT#: 9.4 10*3/uL — ABNORMAL HIGH (ref 1.5–6.5)
NEUT%: 78.7 % — ABNORMAL HIGH (ref 38.4–76.8)
Platelets: 1081 10*3/uL — ABNORMAL HIGH (ref 145–400)
WBC: 11.9 10*3/uL — ABNORMAL HIGH (ref 3.9–10.3)
lymph#: 1.7 10*3/uL (ref 0.9–3.3)

## 2013-05-30 NOTE — Progress Notes (Signed)
Hct  43.6 ;  Hgb  12.8  Today.  No phlebotomy needed per Dr. Cyndie Chime.  Spoke with pt in the lobby, and informed pt  of md's instructions.  Confirmed next appt for 10/24 with pt.   Pt voiced understanding.

## 2013-06-06 ENCOUNTER — Other Ambulatory Visit (HOSPITAL_BASED_OUTPATIENT_CLINIC_OR_DEPARTMENT_OTHER): Payer: Medicare Other | Admitting: Lab

## 2013-06-06 ENCOUNTER — Ambulatory Visit: Payer: Medicare Other

## 2013-06-06 DIAGNOSIS — D45 Polycythemia vera: Secondary | ICD-10-CM

## 2013-06-06 DIAGNOSIS — D75839 Thrombocytosis, unspecified: Secondary | ICD-10-CM

## 2013-06-06 DIAGNOSIS — D473 Essential (hemorrhagic) thrombocythemia: Secondary | ICD-10-CM

## 2013-06-06 LAB — CBC WITH DIFFERENTIAL/PLATELET
BASO%: 2.2 % — ABNORMAL HIGH (ref 0.0–2.0)
Basophils Absolute: 0.2 10*3/uL — ABNORMAL HIGH (ref 0.0–0.1)
EOS%: 3.7 % (ref 0.0–7.0)
HCT: 42.3 % (ref 34.8–46.6)
HGB: 12.7 g/dL (ref 11.6–15.9)
LYMPH%: 15.1 % (ref 14.0–49.7)
MCH: 20.8 pg — ABNORMAL LOW (ref 25.1–34.0)
MCHC: 30.1 g/dL — ABNORMAL LOW (ref 31.5–36.0)
NEUT%: 74 % (ref 38.4–76.8)
Platelets: 453 10*3/uL — ABNORMAL HIGH (ref 145–400)
RDW: 22.5 % — ABNORMAL HIGH (ref 11.2–14.5)

## 2013-06-06 NOTE — Progress Notes (Signed)
Labs reviewed with Lonna Cobb NP with Dr Truett Perna. Phlebotomy therapy held for today with request for lab recheck in 3 weeks.  Discussed above with pt, appointment scheduled with print out given.

## 2013-06-27 ENCOUNTER — Other Ambulatory Visit (HOSPITAL_BASED_OUTPATIENT_CLINIC_OR_DEPARTMENT_OTHER): Payer: Medicare Other | Admitting: Lab

## 2013-06-27 DIAGNOSIS — D45 Polycythemia vera: Secondary | ICD-10-CM

## 2013-06-27 LAB — CBC WITH DIFFERENTIAL/PLATELET
BASO%: 0.2 % (ref 0.0–2.0)
Basophils Absolute: 0 10*3/uL (ref 0.0–0.1)
EOS%: 3.2 % (ref 0.0–7.0)
HCT: 43.4 % (ref 34.8–46.6)
HGB: 13.1 g/dL (ref 11.6–15.9)
MCH: 20.8 pg — ABNORMAL LOW (ref 25.1–34.0)
MCHC: 30.2 g/dL — ABNORMAL LOW (ref 31.5–36.0)
MCV: 69.1 fL — ABNORMAL LOW (ref 79.5–101.0)
MONO#: 0.8 10*3/uL (ref 0.1–0.9)
MONO%: 6.4 % (ref 0.0–14.0)
NEUT%: 78.5 % — ABNORMAL HIGH (ref 38.4–76.8)
RDW: 22.1 % — ABNORMAL HIGH (ref 11.2–14.5)
lymph#: 1.4 10*3/uL (ref 0.9–3.3)

## 2013-07-21 ENCOUNTER — Other Ambulatory Visit: Payer: Self-pay | Admitting: Orthopedic Surgery

## 2013-07-21 DIAGNOSIS — M25551 Pain in right hip: Secondary | ICD-10-CM

## 2013-08-01 ENCOUNTER — Ambulatory Visit
Admission: RE | Admit: 2013-08-01 | Discharge: 2013-08-01 | Disposition: A | Discharge status: Home / Self Care | Payer: Medicare Other | Source: Ambulatory Visit | Attending: Orthopedic Surgery | Admitting: Orthopedic Surgery

## 2013-08-01 DIAGNOSIS — R109 Unspecified abdominal pain: Secondary | ICD-10-CM

## 2013-08-01 DIAGNOSIS — D45 Polycythemia vera: Secondary | ICD-10-CM

## 2013-08-01 DIAGNOSIS — D473 Essential (hemorrhagic) thrombocythemia: Secondary | ICD-10-CM

## 2013-08-01 DIAGNOSIS — M87051 Idiopathic aseptic necrosis of right femur: Secondary | ICD-10-CM

## 2013-08-01 DIAGNOSIS — D75839 Thrombocytosis, unspecified: Secondary | ICD-10-CM

## 2013-08-01 DIAGNOSIS — M25551 Pain in right hip: Secondary | ICD-10-CM

## 2013-08-04 LAB — BODY FLUID CULTURE: Organism ID, Bacteria: NO GROWTH

## 2013-08-12 ENCOUNTER — Ambulatory Visit: Payer: Medicare Other | Admitting: Oncology

## 2013-08-19 ENCOUNTER — Telehealth: Payer: Self-pay | Admitting: Oncology

## 2013-08-19 NOTE — Telephone Encounter (Signed)
pt walked in stating she believed she had appt today but no appt listed Made pt appt for GBS 1st avail on 01/06 CAL given to pt shh

## 2013-08-22 ENCOUNTER — Other Ambulatory Visit: Payer: Medicare Other

## 2013-08-29 ENCOUNTER — Ambulatory Visit (HOSPITAL_BASED_OUTPATIENT_CLINIC_OR_DEPARTMENT_OTHER): Payer: Medicare HMO | Admitting: Oncology

## 2013-08-29 ENCOUNTER — Telehealth: Payer: Self-pay | Admitting: *Deleted

## 2013-08-29 ENCOUNTER — Telehealth: Payer: Self-pay | Admitting: Oncology

## 2013-08-29 ENCOUNTER — Ambulatory Visit (HOSPITAL_BASED_OUTPATIENT_CLINIC_OR_DEPARTMENT_OTHER): Payer: Medicare HMO

## 2013-08-29 VITALS — BP 152/82 | HR 91 | Temp 98.1°F | Resp 18 | Ht 61.0 in | Wt 151.0 lb

## 2013-08-29 DIAGNOSIS — D45 Polycythemia vera: Secondary | ICD-10-CM

## 2013-08-29 DIAGNOSIS — R3589 Other polyuria: Secondary | ICD-10-CM

## 2013-08-29 DIAGNOSIS — D473 Essential (hemorrhagic) thrombocythemia: Secondary | ICD-10-CM

## 2013-08-29 DIAGNOSIS — R358 Other polyuria: Secondary | ICD-10-CM

## 2013-08-29 DIAGNOSIS — R300 Dysuria: Secondary | ICD-10-CM

## 2013-08-29 LAB — CBC WITH DIFFERENTIAL/PLATELET
BASO%: 2.1 % — ABNORMAL HIGH (ref 0.0–2.0)
BASOS ABS: 0.2 10*3/uL — AB (ref 0.0–0.1)
EOS%: 4.2 % (ref 0.0–7.0)
Eosinophils Absolute: 0.4 10*3/uL (ref 0.0–0.5)
HEMATOCRIT: 43.4 % (ref 34.8–46.6)
HGB: 13.4 g/dL (ref 11.6–15.9)
LYMPH%: 10 % — AB (ref 14.0–49.7)
MCH: 20.8 pg — AB (ref 25.1–34.0)
MCHC: 30.9 g/dL — ABNORMAL LOW (ref 31.5–36.0)
MCV: 67.2 fL — ABNORMAL LOW (ref 79.5–101.0)
MONO#: 0.4 10*3/uL (ref 0.1–0.9)
MONO%: 3.9 % (ref 0.0–14.0)
NEUT#: 7.9 10*3/uL — ABNORMAL HIGH (ref 1.5–6.5)
NEUT%: 79.8 % — AB (ref 38.4–76.8)
Platelets: 1009 10*3/uL — ABNORMAL HIGH (ref 145–400)
RBC: 6.46 10*6/uL — ABNORMAL HIGH (ref 3.70–5.45)
RDW: 22.3 % — AB (ref 11.2–14.5)
WBC: 9.9 10*3/uL (ref 3.9–10.3)
lymph#: 1 10*3/uL (ref 0.9–3.3)

## 2013-08-29 LAB — COMPREHENSIVE METABOLIC PANEL (CC13)
ALT: 11 U/L (ref 0–55)
AST: 20 U/L (ref 5–34)
Albumin: 3.7 g/dL (ref 3.5–5.0)
Alkaline Phosphatase: 106 U/L (ref 40–150)
Anion Gap: 9 mEq/L (ref 3–11)
BILIRUBIN TOTAL: 0.62 mg/dL (ref 0.20–1.20)
BUN: 16.9 mg/dL (ref 7.0–26.0)
CO2: 26 mEq/L (ref 22–29)
CREATININE: 0.9 mg/dL (ref 0.6–1.1)
Calcium: 9.5 mg/dL (ref 8.4–10.4)
Chloride: 109 mEq/L (ref 98–109)
GLUCOSE: 97 mg/dL (ref 70–140)
Potassium: 4.1 mEq/L (ref 3.5–5.1)
Sodium: 143 mEq/L (ref 136–145)
Total Protein: 7.9 g/dL (ref 6.4–8.3)

## 2013-08-29 LAB — URINALYSIS, MICROSCOPIC - CHCC
BLOOD: NEGATIVE
Bilirubin (Urine): NEGATIVE
GLUCOSE UR CHCC: NEGATIVE mg/dL
KETONES: NEGATIVE mg/dL
Leukocyte Esterase: NEGATIVE
Nitrite: NEGATIVE
PH: 6 (ref 4.6–8.0)
Protein: NEGATIVE mg/dL
Specific Gravity, Urine: 1.02 (ref 1.003–1.035)
Urobilinogen, UR: 0.2 mg/dL (ref 0.2–1)

## 2013-08-29 NOTE — Telephone Encounter (Signed)
pt sent to lb and given schedule for april

## 2013-08-29 NOTE — Progress Notes (Signed)
   [name]    OFFICE PROGRESS NOTE   INTERVAL HISTORY:   Alejandra [name] returns as scheduled. She continues hydroxyurea. She reports frequent urination. No symptoms of venous or arterial thrombosis. Intermittent pruritus at the right hip incision. The last phlebotomy was 05/16/2013.  Objective:  Vital signs in last 24 hours:  Blood pressure 152/82, pulse 91, temperature 98.1 F (36.7 C), temperature source Oral, resp. rate 18, height 5\' 1"  (1.549 m), weight 151 lb (68.493 kg), SpO2 98.00%.    HEENT: No thrush or ulcers Resp: Lungs clear bilaterally Cardio: Regular rate and rhythm GI: No hepatosplenomegaly Vascular: No leg edema  Skin: The right hip incision has healed     Lab Results:  Lab Results  Component Value Date   WBC 9.9 08/29/2013   HGB 13.4 08/29/2013   HCT 43.4 08/29/2013   MCV 67.2* 08/29/2013   PLT 1,009* 08/29/2013   NEUTROABS 7.9* 08/29/2013      Medications: I have reviewed the patient's current medications.  Assessment/Plan: 1. Polycythemia vera. She continues hydroxyurea. 2. Hypertension, followed by Dr. Montez Morita.  3. History of hyperpigmentation at the hands. Question related to hydroxyurea. 4. Hip fracture February 2013. She underwent a bipolar hip arthroplasty on 04/23/2012. 5. Thrombocytosis. Most likely secondary to polycythemia vera  Disposition:  She appears stable. She will continue hydroxyurea. The thrombocytosis is likely secondary to iron deficiency and polycythemia. We will not increase the hydroxyurea dose to lower the platelet count. She has no history of thrombotic disease. She will return for an office visit and CBC in 3 months.  She complains of polyuria today. I cannot relate this to the polycythemia diagnosis. We will check a urinalysis today.   Betsy Coder, MD  08/29/2013  2:12 PM

## 2013-08-29 NOTE — Telephone Encounter (Signed)
Called pt with lab results. Continue Hydrea 1,000 mg daily- per Dr. Benay Spice. She voiced understanding.

## 2013-09-20 ENCOUNTER — Emergency Department (HOSPITAL_COMMUNITY)
Admission: EM | Admit: 2013-09-20 | Discharge: 2013-09-20 | Disposition: A | Discharge status: Home / Self Care | Payer: Medicare HMO | Attending: Emergency Medicine | Admitting: Emergency Medicine

## 2013-09-20 ENCOUNTER — Emergency Department (HOSPITAL_COMMUNITY): Payer: Medicare HMO

## 2013-09-20 ENCOUNTER — Encounter (HOSPITAL_COMMUNITY): Payer: Self-pay | Admitting: Emergency Medicine

## 2013-09-20 DIAGNOSIS — Z7982 Long term (current) use of aspirin: Secondary | ICD-10-CM | POA: Insufficient documentation

## 2013-09-20 DIAGNOSIS — Z96649 Presence of unspecified artificial hip joint: Secondary | ICD-10-CM | POA: Insufficient documentation

## 2013-09-20 DIAGNOSIS — M1611 Unilateral primary osteoarthritis, right hip: Secondary | ICD-10-CM

## 2013-09-20 DIAGNOSIS — M1610 Unilateral primary osteoarthritis, unspecified hip: Secondary | ICD-10-CM | POA: Insufficient documentation

## 2013-09-20 DIAGNOSIS — Z87891 Personal history of nicotine dependence: Secondary | ICD-10-CM | POA: Insufficient documentation

## 2013-09-20 DIAGNOSIS — I10 Essential (primary) hypertension: Secondary | ICD-10-CM | POA: Insufficient documentation

## 2013-09-20 DIAGNOSIS — M169 Osteoarthritis of hip, unspecified: Secondary | ICD-10-CM | POA: Insufficient documentation

## 2013-09-20 DIAGNOSIS — Z8781 Personal history of (healed) traumatic fracture: Secondary | ICD-10-CM | POA: Insufficient documentation

## 2013-09-20 DIAGNOSIS — Z79899 Other long term (current) drug therapy: Secondary | ICD-10-CM | POA: Insufficient documentation

## 2013-09-20 DIAGNOSIS — H409 Unspecified glaucoma: Secondary | ICD-10-CM | POA: Insufficient documentation

## 2013-09-20 MED ORDER — HYDROCODONE-ACETAMINOPHEN 10-325 MG PO TABS: ORAL_TABLET | ORAL | Status: DC

## 2013-09-20 NOTE — ED Provider Notes (Signed)
Medical screening examination/treatment/procedure(s) were conducted as a shared visit with non-physician practitioner(s) and myself.  I personally evaluated the patient during the encounter.  EKG Interpretation   None        28F here with R hip pain. No falls. Hx of hip arthroplasty and arthritis. Denies fever, abdominal pain, vomiting, dysuria, difficulty with bowel movement. On exam, no abdominal pain. Per Gertie Fey Tran's exam, no hernia. Pain musculoskeletal, no concern for septic arthritis, fracture. Stable for discharge.  Osvaldo Shipper, MD 09/20/13 2010

## 2013-09-20 NOTE — ED Notes (Addendum)
Pt in c/o right hip pain, states she feels like the muscle spasms when she walks, had her hip replaced approx a year ago, symptoms have been going on for a month or two, seen by her orthopedist and they dx her with arthritis and prescribed her a medication, but she states that medication makes her nauseous so she hasn't been taking it.  Denies pain unless she is walking.

## 2013-09-20 NOTE — ED Provider Notes (Signed)
CSN: [id number]     Arrival date & time 09/20/13  1330 History   First MD Initiated Contact with Patient 09/20/13 1343     No chief complaint on file.  (Consider location/radiation/quality/duration/timing/severity/associated sxs/prior Treatment) HPI  73 year old female with history of polycythemia vera, thrombocytosis, arthritis, and prior R hip fx presents complaining of R hip pain. Patient reports she has had a right hip replaced approximately a year ago. Continues to endorse right hip pain for the past 2-3 months.  Pain is achy, throbbing, worsening with walking and improves with rest.  Pain is non radiating. Denies fever, abd pain, urinary/bowel complaint, rash, numbness, weakness or swelling.  Was seen by her orthopedist for this a month ago.  Sts it was thought to be arthritis, and was prescribed Ultram but pt cannot tolerates medication due to side effect.  Sts she also has blood work, and "fluid drawn from it" to check but sts it was negative.  For the past 2 days she is having worsening R hip pain in the same location.  Pt felt she needs something stronger to help her with her pain aside from her usual tylenol which usually helps.  Denies any recent trauma.    Past Medical History  Diagnosis Date  . Arthritis   . Glaucoma   . Hypertension   . Fracture 10/05/11    "fell and broke right hip"  . Fracture of femoral neck, right 10/12/2011  . Glaucoma     bilateral  . Polycythemia vera   . Avascular necrosis of femur head, right 04/23/2012  . Thrombocytosis 04/23/2012   Past Surgical History  Procedure Laterality Date  . Hip pinning,cannulated  10/12/2011    Procedure: CANNULATED HIP PINNING;  Surgeon: Johnny Bridge, MD;  Location: [place];  Service: Orthopedics;  Laterality: Right;  . Colonoscopy    . Hardware removal  04/23/2012    Procedure: HARDWARE REMOVAL;  Surgeon: Johnny Bridge, MD;  Location: [place];  Service: Orthopedics;  Laterality: Right;  . Hip arthroplasty  04/23/2012     Procedure: ARTHROPLASTY BIPOLAR HIP;  Surgeon: Johnny Bridge, MD;  Location: [place];  Service: Orthopedics;  Laterality: Right;   No family history on file. History  Substance Use Topics  . Smoking status: Former Smoker -- 0.12 packs/day    Types: Cigarettes    Quit date: 08/15/1991  . Smokeless tobacco: Never Used  . Alcohol Use: No     Comment: 10/12/11 "did drink in my younger; don't drink at all now"   OB History   Grav Para Term Preterm Abortions TAB SAB Ect Mult Living                 Review of Systems  Constitutional: Negative for fever.  Musculoskeletal: Positive for arthralgias. Negative for back pain.  Skin: Negative for rash and wound.  Neurological: Negative for numbness.    Allergies  Review of patient's allergies indicates no known allergies.  Home Medications   Current Outpatient Rx  Name  Route  Sig  Dispense  Refill  . acetaminophen (TYLENOL) 325 MG tablet   Oral   Take 650 mg by mouth every 6 (six) hours as needed.         . ALPRAZolam (XANAX) 0.25 MG tablet   Oral   Take 0.25 mg by mouth 2 (two) times daily as needed.         Marland Kitchen amLODipine (NORVASC) 10 MG tablet   Oral   Take 10 mg  by mouth daily.         Marland Kitchen aspirin EC 81 MG tablet   Oral   Take 81 mg by mouth daily.         . baclofen (LIORESAL) 10 MG tablet   Oral   Take 10 mg by mouth 2 (two) times daily as needed.          . benazepril (LOTENSIN) 40 MG tablet   Oral   Take 40 mg by mouth daily.         . calcium-vitamin D (OSCAL WITH D) 500-200 MG-UNIT per tablet   Oral   Take 1 tablet by mouth daily.         . dorzolamide-timolol (COSOPT) 22.3-6.8 MG/ML ophthalmic solution   Both Eyes   Place 1 drop into both eyes 2 (two) times daily.         Marland Kitchen HYDROcodone-acetaminophen (NORCO) 10-325 MG per tablet      Take one tablet every 6 hours as needed for pain   120 tablet   5   . hydroxyurea (HYDREA) 500 MG capsule   Oral   Take 1,000 mg by mouth daily.           . naproxen sodium (ANAPROX) 220 MG tablet   Oral   Take 220 mg by mouth 2 (two) times daily as needed.         . traMADol (ULTRAM) 50 MG tablet   Oral   Take 50 mg by mouth every 6 (six) hours as needed. For pain.          BP 166/101  Pulse 86  Temp(Src) 98 F (36.7 C) (Oral)  Resp 20  SpO2 96% Physical Exam  Nursing note and vitals reviewed. Constitutional: She appears well-developed and well-nourished. No distress.  HENT:  Head: Atraumatic.  Eyes: Conjunctivae are normal.  Neck: Neck supple.  Abdominal: There is no tenderness.  Musculoskeletal: She exhibits tenderness (R hip: tenderness to lateral proximal hip at the femoral neck region. No overlying skin changes.  Normal hip flexion, extension, ab/adduction.  No shortening, no rash or edema. Intact distal pulses and norrmal skin.). She exhibits no edema.  R knee and R ankle with FROM, nontender.    Neurological: She is alert.  Able to ambulate  Skin: No rash noted.  Psychiatric: She has a normal mood and affect.    ED Course  Procedures (including critical care time)  2:17 PM Patient has acute on chronic right hip pain, prior hip replacement. No recent traumatic injury. No sign to suggest infectious etiology. No red flags. Plan to obtain right hip x-ray to ensure no dislocation, fracture, or hardware migration. Patient otherwise in no acute distress and able to ambulate.  3:04 PM X-ray of right hip show severe osteoarthritis but no other acute finding. Will prescribe pain medication and close followup with ortho as needed.  Care discussed with Dr. Mingo Amber.  Labs Review Labs Reviewed - No data to display Imaging Review Dg Hip Complete Right  09/20/2013   CLINICAL DATA:  Right hip pain.  Status post arthroplasty.  EXAM: RIGHT HIP - COMPLETE 2+ VIEW  COMPARISON:  DG HIP OPERATIVE*R* dated 10/12/2011; DG HIP PORTABLE 1 VIEW*L* dated 04/23/2012; DG PELVIS PORTABLE dated 04/23/2012  FINDINGS: Moderate osteopenia. Moderate  left hip osteoarthritis. Status post right hip arthroplasty. No periprosthetic fracture or acute complication identified.  IMPRESSION: Expected appearance after right hip arthroplasty.  Osteopenia.   Electronically Signed   By: Adria Devon.D.  On: 09/20/2013 14:43    EKG Interpretation   None       MDM   1. Osteoarthritis of right hip    BP 143/84  Pulse 78  Temp(Src) 98 F (36.7 C) (Oral)  Resp 20  SpO2 95%  I have reviewed nursing notes and vital signs. I personally reviewed the imaging tests through PACS system  I reviewed available ER/hospitalization records thought the EMR     Domenic Moras, [place] 09/20/13 1505

## 2013-09-20 NOTE — Discharge Instructions (Signed)
Osteoarthritis Osteoarthritis is a disease that causes soreness and swelling (inflammation) of a joint. It occurs when the cartilage at the affected joint wears down. Cartilage acts as a cushion, covering the ends of bones where they meet to form a joint. Osteoarthritis is the most common form of arthritis. It often occurs in older people. The joints affected most often by this condition include those in the:  Ends of the fingers.  Thumbs.  Neck.  Lower back.  Knees.  Hips. CAUSES  Over time, the cartilage that covers the ends of bones begins to wear away. This causes bone to rub on bone, producing pain and stiffness in the affected joints.  RISK FACTORS Certain factors can increase your chances of having osteoarthritis, including:  Older age.  Excessive body weight.  Overuse of joints. SIGNS AND SYMPTOMS   Pain, swelling, and stiffness in the joint.  Over time, the joint may lose its normal shape.  Small deposits of bone (osteophytes) may grow on the edges of the joint.  Bits of bone or cartilage can break off and float inside the joint space. This may cause more pain and damage. DIAGNOSIS  Your health care provider will do a physical exam and ask about your symptoms. Various tests may be ordered, such as:  X-rays of the affected joint.  An MRI scan.  Blood tests to rule out other types of arthritis.  Joint fluid tests. This involves using a needle to draw fluid from the joint and examining the fluid under a microscope. TREATMENT  Goals of treatment are to control pain and improve joint function. Treatment plans may include:  A prescribed exercise program that allows for rest and joint relief.  A weight control plan.  Pain relief techniques, such as:  Properly applied heat and cold.  Electric pulses delivered to nerve endings under the skin (transcutaneous electrical nerve stimulation, TENS).  Massage.  Certain nutritional supplements.  Medicines to  control pain, such as:  Acetaminophen.  Nonsteroidal anti-inflammatory drugs (NSAIDs), such as naproxen.  Narcotic or central-acting agents, such as tramadol.  Corticosteroids. These can be given orally or as an injection.  Surgery to reposition the bones and relieve pain (osteotomy) or to remove loose pieces of bone and cartilage. Joint replacement may be needed in advanced states of osteoarthritis. HOME CARE INSTRUCTIONS   Only take over-the-counter or prescription medicines as directed by your health care provider. Take all medicines exactly as instructed.  Maintain a healthy weight. Follow your health care provider's instructions for weight control. This may include dietary instructions.  Exercise as directed. Your health care provider can recommend specific types of exercise. These may include:  Strengthening exercises These are done to strengthen the muscles that support joints affected by arthritis. They can be performed with weights or with exercise bands to add resistance.  Aerobic activities These are exercises, such as brisk walking or low-impact aerobics, that get your heart pumping.  Range-of-motion activities These keep your joints limber.  Balance and agility exercises These help you maintain daily living skills.  Rest your affected joints as directed by your health care provider.  Follow up with your health care provider as directed. SEEK MEDICAL CARE IF:   Your skin turns red.  You develop a rash in addition to your joint pain.  You have worsening joint pain. SEEK IMMEDIATE MEDICAL CARE IF:  You have a significant loss of weight or appetite.  You have a fever along with joint or muscle aches.  You have   night sweats. FOR MORE INFORMATION  National Institute of Arthritis and Musculoskeletal and Skin Diseases: www.niams.nih.gov National Institute on Aging: www.nia.nih.gov American College of Rheumatology: www.rheumatology.org Document Released: 07/31/2005  Document Revised: 05/21/2013 Document Reviewed: 04/07/2013 [employer] Patient Information 2014 [employer].  

## 2013-11-28 ENCOUNTER — Ambulatory Visit (HOSPITAL_BASED_OUTPATIENT_CLINIC_OR_DEPARTMENT_OTHER): Payer: Medicare HMO | Admitting: Nurse Practitioner

## 2013-11-28 ENCOUNTER — Other Ambulatory Visit (HOSPITAL_BASED_OUTPATIENT_CLINIC_OR_DEPARTMENT_OTHER): Payer: Medicare HMO

## 2013-11-28 ENCOUNTER — Telehealth: Payer: Self-pay | Admitting: Oncology

## 2013-11-28 ENCOUNTER — Telehealth: Payer: Self-pay | Admitting: *Deleted

## 2013-11-28 VITALS — BP 189/97 | HR 74 | Temp 98.0°F | Resp 20 | Ht 61.0 in | Wt >= 6400 oz

## 2013-11-28 DIAGNOSIS — D45 Polycythemia vera: Secondary | ICD-10-CM

## 2013-11-28 LAB — CBC WITH DIFFERENTIAL/PLATELET
BASO%: 2.9 % — ABNORMAL HIGH (ref 0.0–2.0)
Basophils Absolute: 0.4 10*3/uL — ABNORMAL HIGH (ref 0.0–0.1)
EOS%: 3.9 % (ref 0.0–7.0)
Eosinophils Absolute: 0.5 10*3/uL (ref 0.0–0.5)
HCT: 46.5 % (ref 34.8–46.6)
HEMOGLOBIN: 13.4 g/dL (ref 11.6–15.9)
LYMPH#: 1.1 10*3/uL (ref 0.9–3.3)
LYMPH%: 8.7 % — ABNORMAL LOW (ref 14.0–49.7)
MCH: 19.2 pg — AB (ref 25.1–34.0)
MCHC: 28.8 g/dL — ABNORMAL LOW (ref 31.5–36.0)
MCV: 66.5 fL — ABNORMAL LOW (ref 79.5–101.0)
MONO#: 0.6 10*3/uL (ref 0.1–0.9)
MONO%: 4.8 % (ref 0.0–14.0)
NEUT#: 10 10*3/uL — ABNORMAL HIGH (ref 1.5–6.5)
NEUT%: 79.7 % — ABNORMAL HIGH (ref 38.4–76.8)
PLATELETS: 983 10*3/uL — AB (ref 145–400)
RBC: 6.99 10*6/uL — ABNORMAL HIGH (ref 3.70–5.45)
RDW: 23.1 % — ABNORMAL HIGH (ref 11.2–14.5)
WBC: 12.6 10*3/uL — ABNORMAL HIGH (ref 3.9–10.3)

## 2013-11-28 NOTE — Telephone Encounter (Signed)
Gave pt appt for lab md and phlebotomy April and june 2015

## 2013-11-28 NOTE — Progress Notes (Signed)
  [facility] OFFICE PROGRESS NOTE   Diagnosis:  Polycythemia vera. Currently on hydroxyurea 1000 mg daily. Last phlebotomy 05/16/2013.  INTERVAL HISTORY:   She returns as scheduled. She continues hydroxyurea 1000 mg daily. Main complaint is related to right hip pain which she attributes to osteoarthritis. She denies nausea/vomiting. No mouth sores. No diarrhea. No skin rash. She denies leg swelling or calf pain. No shortness of breath or chest pain.  Objective:  Vital signs in last 24 hours:  Blood pressure 189/97, pulse 74, temperature 98 F (36.7 C), temperature source Oral, resp. rate 20, height 5\' 1"  (1.549 m), weight 1450 lb (657.716 kg).    HEENT: No thrush or ulcerations. Lymphatics: No palpable cervical or supraclavicular lymph nodes. Resp: Lungs clear. Cardio: Regular cardiac rhythm. GI: Abdomen soft and nontender. No organomegaly. Vascular: No leg edema.   Lab Results:  Lab Results  Component Value Date   WBC 12.6* 11/28/2013   HGB 13.4 11/28/2013   HCT 46.5 11/28/2013   MCV 66.5* 11/28/2013   PLT 983* 11/28/2013   NEUTROABS 10.0* 11/28/2013    Imaging:  No results found.  Medications: I have reviewed the patient's current medications.  Assessment/Plan: 1. Polycythemia vera. She continues hydroxyurea. 2. Hypertension, followed by Dr. Montez Morita.  3. History of hyperpigmentation at the hands. Question related to hydroxyurea. 4. Hip fracture February 2013. She underwent a bipolar hip arthroplasty on 04/23/2012. 5. Thrombocytosis. Most likely secondary to polycythemia vera and iron deficiency.  Disposition: The hematocrit is above goal range. Dr. Benay Spice recommends a phlebotomy as well as increasing the hydroxyurea from 1000 mg daily to 1500 mg on Mondays and Thursdays and 1000 mg all other days. She would like to return for the phlebotomy in one week. She will return for a followup CBC on 01/02/2014. She will return for a followup visit on  01/30/2014.  Plan reviewed with Dr. Benay Spice.    Owens Shark ANP/GNP-BC   11/28/2013  2:25 PM

## 2013-11-28 NOTE — Patient Instructions (Signed)
Increase Hydrea to 1500 mg on Mondays and Thursdays; 1000 mg all other days.

## 2013-11-28 NOTE — Telephone Encounter (Signed)
Per staff message and POF I have scheduled appts.  JMW  

## 2013-12-03 ENCOUNTER — Telehealth: Payer: Self-pay | Admitting: *Deleted

## 2013-12-03 NOTE — Telephone Encounter (Signed)
Per patient request I have rescheduled her appt from this Friday to next week

## 2013-12-10 ENCOUNTER — Ambulatory Visit (HOSPITAL_BASED_OUTPATIENT_CLINIC_OR_DEPARTMENT_OTHER): Payer: Medicare HMO

## 2013-12-10 VITALS — BP 153/82 | HR 70 | Temp 98.5°F | Resp 20

## 2013-12-10 DIAGNOSIS — D45 Polycythemia vera: Secondary | ICD-10-CM

## 2013-12-10 NOTE — Patient Instructions (Signed)
Therapeutic Phlebotomy Therapeutic phlebotomy is the controlled removal of blood from your body for the purpose of treating a medical condition. It is similar to donating blood. Usually, about a pint (470 mL) of blood is removed. The average adult has 9 to 12 pints (4.3 to 5.7 L) of blood. Therapeutic phlebotomy may be used to treat the following medical conditions:  Hemochromatosis. This is a condition in which there is too much iron in the blood.  Polycythemia vera. This is a condition in which there are too many red cells in the blood.  Porphyria cutanea tarda. This is a disease usually passed from one generation to the next (inherited). It is a condition in which an important part of hemoglobin is not made properly. This results in the build up of abnormal amounts of porphyrins in the body.  Sickle cell disease. This is an inherited disease. It is a condition in which the red blood cells form an abnormal crescent shape rather than a round shape. LET YOUR CAREGIVER KNOW ABOUT:  Allergies.  Medicines taken including herbs, eyedrops, over-the-counter medicines, and creams.  Use of steroids (by mouth or creams).  Previous problems with anesthetics or numbing medicine.  History of blood clots.  History of bleeding or blood problems.  Previous surgery.  Possibility of pregnancy, if this applies. RISKS AND COMPLICATIONS This is a simple and safe procedure. Problems are unlikely. However, problems can occur and may include:  Nausea or lightheadedness.  Low blood pressure.  Soreness, bleeding, swelling, or bruising at the needle insertion site.  Infection. BEFORE THE PROCEDURE  This is a procedure that can be done as an outpatient. Confirm the time that you need to arrive for your procedure. Confirm whether there is a need to fast or withhold any medications. It is helpful to wear clothing with sleeves that can be raised above the elbow. A blood sample may be done to determine the  amount of red blood cells or iron in your blood. Plan ahead of time to have someone drive you home after the procedure. PROCEDURE The entire procedure from preparation through recovery takes about 1 hour. The actual collection takes about 10 to 15 minutes.  A needle will be inserted into your vein.  Tubing and a collection bag will be attached to that needle.  Blood will flow through the needle and tubing into the collection bag.  You may be asked to open and close your hand slowly and continuously during the entire collection.  Once the specified amount of blood has been removed from your body, the collection bag and tubing will be clamped.  The needle will be removed.  Pressure will be held on the site of the needle insertion to stop the bleeding. Then a bandage will be placed over the needle insertion site. AFTER THE PROCEDURE  Your recovery will be assessed and monitored. If there are no problems, as an outpatient, you should be able to go home shortly after the procedure.  Document Released: 01/02/2011 Document Revised: 10/23/2011 Document Reviewed: 01/02/2011 [employer] Patient Information 2014 [employer].  

## 2013-12-10 NOTE — Progress Notes (Signed)
On arrival to treatment, patient reports she has not eaten breakfast.  Sandwich, chips and drink provided to patient.  VS - BP 203/119 on right arm, 176/101 on left arm.  MD/APP notified.  Proceed with phlebotomy - let patient know she needs to see her PCP for blood pressure management.

## 2014-01-02 ENCOUNTER — Other Ambulatory Visit (HOSPITAL_BASED_OUTPATIENT_CLINIC_OR_DEPARTMENT_OTHER): Payer: Medicare HMO

## 2014-01-02 DIAGNOSIS — D45 Polycythemia vera: Secondary | ICD-10-CM

## 2014-01-02 LAB — CBC WITH DIFFERENTIAL/PLATELET
BASO%: 0.9 % (ref 0.0–2.0)
Basophils Absolute: 0.1 10*3/uL (ref 0.0–0.1)
EOS%: 3.3 % (ref 0.0–7.0)
Eosinophils Absolute: 0.3 10*3/uL (ref 0.0–0.5)
HEMATOCRIT: 44.7 % (ref 34.8–46.6)
HEMOGLOBIN: 13.1 g/dL (ref 11.6–15.9)
LYMPH%: 11.1 % — AB (ref 14.0–49.7)
MCH: 21.1 pg — AB (ref 25.1–34.0)
MCHC: 29.3 g/dL — ABNORMAL LOW (ref 31.5–36.0)
MCV: 71.9 fL — ABNORMAL LOW (ref 79.5–101.0)
MONO#: 0.5 10*3/uL (ref 0.1–0.9)
MONO%: 5.2 % (ref 0.0–14.0)
NEUT#: 7.7 10*3/uL — ABNORMAL HIGH (ref 1.5–6.5)
NEUT%: 79.5 % — ABNORMAL HIGH (ref 38.4–76.8)
Platelets: 1341 10*3/uL — ABNORMAL HIGH (ref 145–400)
RBC: 6.22 10*6/uL — ABNORMAL HIGH (ref 3.70–5.45)
RDW: 28.5 % — ABNORMAL HIGH (ref 11.2–14.5)
WBC: 9.7 10*3/uL (ref 3.9–10.3)
lymph#: 1.1 10*3/uL (ref 0.9–3.3)

## 2014-01-06 ENCOUNTER — Telehealth: Payer: Self-pay | Admitting: *Deleted

## 2014-01-06 NOTE — Telephone Encounter (Signed)
Message copied by Brien Few on Tue Jan 06, 2014  1:57 PM ------      Message from: Betsy Coder B      Created: Fri Jan 02, 2014  1:58 PM       Please call patient, hb is slightly better, is she taking the increased dose of hyrea?      F/u as scheduled ------

## 2014-01-07 NOTE — Telephone Encounter (Signed)
Pt returned call, she confirms she is taking increased dose of Hydrea. Taking 1,500 twice/ week (Mon+Tues). Takes 1,000 mg all other days. Instructed her to change to Mon+Thurs, will recheck lab with next visit. She voiced understanding.

## 2014-01-30 ENCOUNTER — Other Ambulatory Visit (HOSPITAL_BASED_OUTPATIENT_CLINIC_OR_DEPARTMENT_OTHER): Payer: Medicare HMO

## 2014-01-30 ENCOUNTER — Telehealth: Payer: Self-pay | Admitting: Oncology

## 2014-01-30 ENCOUNTER — Ambulatory Visit (HOSPITAL_BASED_OUTPATIENT_CLINIC_OR_DEPARTMENT_OTHER): Payer: Medicare HMO | Admitting: Nurse Practitioner

## 2014-01-30 VITALS — BP 176/92 | HR 79 | Temp 98.2°F | Resp 20 | Ht 61.0 in | Wt 144.2 lb

## 2014-01-30 DIAGNOSIS — D473 Essential (hemorrhagic) thrombocythemia: Secondary | ICD-10-CM

## 2014-01-30 DIAGNOSIS — D45 Polycythemia vera: Secondary | ICD-10-CM

## 2014-01-30 LAB — CBC WITH DIFFERENTIAL/PLATELET
BASO%: 0.6 % (ref 0.0–2.0)
Basophils Absolute: 0.1 10*3/uL (ref 0.0–0.1)
EOS%: 4.7 % (ref 0.0–7.0)
Eosinophils Absolute: 0.7 10*3/uL — ABNORMAL HIGH (ref 0.0–0.5)
HCT: 46.2 % (ref 34.8–46.6)
HGB: 13.6 g/dL (ref 11.6–15.9)
LYMPH%: 7.8 % — ABNORMAL LOW (ref 14.0–49.7)
MCH: 20.7 pg — AB (ref 25.1–34.0)
MCHC: 29.4 g/dL — ABNORMAL LOW (ref 31.5–36.0)
MCV: 70.3 fL — ABNORMAL LOW (ref 79.5–101.0)
MONO#: 0.5 10*3/uL (ref 0.1–0.9)
MONO%: 3.8 % (ref 0.0–14.0)
NEUT#: 11.8 10*3/uL — ABNORMAL HIGH (ref 1.5–6.5)
NEUT%: 83.1 % — ABNORMAL HIGH (ref 38.4–76.8)
Platelets: 907 10*3/uL — ABNORMAL HIGH (ref 145–400)
RBC: 6.57 10*6/uL — ABNORMAL HIGH (ref 3.70–5.45)
RDW: 26.6 % — AB (ref 11.2–14.5)
WBC: 14.2 10*3/uL — ABNORMAL HIGH (ref 3.9–10.3)
lymph#: 1.1 10*3/uL (ref 0.9–3.3)

## 2014-01-30 NOTE — Telephone Encounter (Signed)
gv adn printed appt sched and avs for pt for June thru Aug....sed added tx. °

## 2014-01-30 NOTE — Progress Notes (Signed)
  [facility] OFFICE PROGRESS NOTE   Diagnosis:  Polycythemia vera.  INTERVAL HISTORY:   Alejandra [name] returns as scheduled. The hydroxyurea dose was increased to 1500 mg on Mondays and Thursdays and 1000 mg all other days following office visit 11/28/2013. She last had a phlebotomy on 12/10/2013.  It is unclear if she is taking the hydroxyurea as prescribed. Some days she reports taking 1 tablet and other days 2 tablets.  She denies nausea/vomiting. No mouth sores. No diarrhea. She has an occasional rash over the forearms which is pruritic. She denies shortness of breath. No chest pain. No leg swelling or calf pain. No focal weakness.  Objective:  Vital signs in last 24 hours:  Blood pressure 176/92, pulse 79, temperature 98.2 F (36.8 C), temperature source Oral, resp. rate 20, height 5\' 1"  (1.549 m), weight 144 lb 3.2 oz (65.409 kg), SpO2 100.00%.    HEENT: No thrush or ulcerations. Resp: Lungs clear. Cardio: Regular cardiac rhythm. GI: Abdomen soft and nontender. No organomegaly. Vascular: No leg edema. Calves soft and nontender. Neuro: Motor strength 5 over 5.  Skin: No rash.    Lab Results:  Lab Results  Component Value Date   WBC 14.2* 01/30/2014   HGB 13.6 01/30/2014   HCT 46.2 01/30/2014   MCV 70.3* 01/30/2014   PLT 907* 01/30/2014   NEUTROABS 11.8* 01/30/2014    Imaging:  No results found.  Medications: I have reviewed the patient's current medications.  Assessment/Plan: 1. Polycythemia vera on hydroxyurea. 2. Hypertension, followed by Dr. Montez Morita.  3. History of hyperpigmentation at the hands. Question related to hydroxyurea. 4. Hip fracture February 2013. She underwent a bipolar hip arthroplasty on 04/23/2012. 5. Thrombocytosis. Most likely secondary to polycythemia vera and iron deficiency.   Disposition: She appears stable. The hematocrit remains above goal range. I recommended a phlebotomy today. She is unable to state today for a  phlebotomy but will return next week.  As it is unclear how she is taking the hydroxyurea I will continue her at the last recommended dose of 1500 mg on Mondays and Thursdays and 1000 mg all other days. This was reviewed with her at today's visit and given to her in written form.  She will return for a CBC in approximately 4 weeks and a followup visit on 03/20/2014. She will contact the office in the interim with any problems.    Ned Card ANP/GNP-BC   01/30/2014  1:36 PM

## 2014-02-05 ENCOUNTER — Other Ambulatory Visit: Payer: Self-pay | Admitting: *Deleted

## 2014-02-06 ENCOUNTER — Telehealth: Payer: Self-pay | Admitting: Oncology

## 2014-02-06 ENCOUNTER — Other Ambulatory Visit: Payer: Medicare HMO

## 2014-02-06 ENCOUNTER — Other Ambulatory Visit: Payer: Self-pay | Admitting: *Deleted

## 2014-02-06 NOTE — Telephone Encounter (Signed)
pt called to r/s due to being in alot of pain...done...pt aware of new d.t

## 2014-02-12 ENCOUNTER — Ambulatory Visit (HOSPITAL_BASED_OUTPATIENT_CLINIC_OR_DEPARTMENT_OTHER): Payer: Medicare HMO

## 2014-02-12 ENCOUNTER — Other Ambulatory Visit (HOSPITAL_BASED_OUTPATIENT_CLINIC_OR_DEPARTMENT_OTHER): Payer: Medicare HMO

## 2014-02-12 VITALS — BP 133/84 | HR 58 | Temp 97.5°F | Resp 18

## 2014-02-12 DIAGNOSIS — D45 Polycythemia vera: Secondary | ICD-10-CM

## 2014-02-12 LAB — CBC WITH DIFFERENTIAL/PLATELET
BASO%: 2.6 % — AB (ref 0.0–2.0)
Basophils Absolute: 0.2 10*3/uL — ABNORMAL HIGH (ref 0.0–0.1)
EOS ABS: 0.4 10*3/uL (ref 0.0–0.5)
EOS%: 4.5 % (ref 0.0–7.0)
HCT: 45.6 % (ref 34.8–46.6)
HGB: 13.5 g/dL (ref 11.6–15.9)
LYMPH%: 11.2 % — ABNORMAL LOW (ref 14.0–49.7)
MCH: 21 pg — ABNORMAL LOW (ref 25.1–34.0)
MCHC: 29.7 g/dL — ABNORMAL LOW (ref 31.5–36.0)
MCV: 70.8 fL — AB (ref 79.5–101.0)
MONO#: 0.4 10*3/uL (ref 0.1–0.9)
MONO%: 4.7 % (ref 0.0–14.0)
NEUT%: 77 % — ABNORMAL HIGH (ref 38.4–76.8)
NEUTROS ABS: 6.7 10*3/uL — AB (ref 1.5–6.5)
Platelets: 568 10*3/uL — ABNORMAL HIGH (ref 145–400)
RBC: 6.44 10*6/uL — AB (ref 3.70–5.45)
RDW: 25.7 % — AB (ref 11.2–14.5)
WBC: 8.7 10*3/uL (ref 3.9–10.3)
lymph#: 1 10*3/uL (ref 0.9–3.3)

## 2014-02-12 NOTE — Progress Notes (Signed)
Phlebotomy today. Goal is Hct < 40%; patient's Hct = 45.6 today. Removed 500cc blood via Phlebotomy. Used 18 gauge needle and 536ml bag from pharmacy to collect blood; bag supported by medal stool located in utility closet. Total time to collect blood = 26 minutes. Patient tolerated well with no complaints. 30 minute observation post phlebotomy; patient ate sandwich, chips and soda.

## 2014-02-12 NOTE — Patient Instructions (Signed)
Therapeutic Phlebotomy Care After Refer to this sheet in the next few weeks. These instructions provide you with information on caring for yourself after your procedure. Your caregiver may also give you more specific instructions. Your treatment has been planned according to current medical practices, but problems sometimes occur. Call your caregiver if you have any problems or questions after your procedure. HOME CARE INSTRUCTIONS Most people can go back to their normal activities right away. Before you leave, be sure to ask if there is anything you should or should not do. In general, it would be wise to:  Keep the bandage dry. You can remove the bandage after about 5 hours.  Eat well-balanced meals for the next 24 hours.  Drink enough fluids to keep your urine clear or pale yellow.  Avoid drinking alcohol minimally until after eating.  Avoid smoking for at least 30 minutes after the procedure.  Avoid strenous physical activity or heavy lifting or pulling for about 5 hours after the procedure.  Athletes should avoid strenous exercise for 12 hours or more.  Change positions slowly for the remainder of the day to prevent lightheadedness or fainting.  If you feel lightheaded, lie down until the feeling subsides.  If you have bleeding from the needle insertion site, elevate your arm and press firmly on the site until the bleeding stops.  If bruising or bleeding appears under the skin, apply ice to the area for 15 to 20 minutes, 3 to 4 times per day. Put the ice in a plastic bag and place a towel between the bag of ice and your skin. Do this while you are awake for the first 24 hours. The ice packs can be stopped before 24 hours if the swelling goes away. If swelling persists after 24 hours, a warm, moist washcloth can be applied to the area for 15 to 20 minutes, 3 to 4 times per day. The warm, moist treatments can be stopped when the swelling goes away.  It is important to continue further  therapeutic phlebotomy as directed by your caregiver. SEEK MEDICAL CARE IF:  There is bleeding or fluid leaking from the needle insertion site.  The needle insertion site becomes swollen, red, or sore.  You feel lightheaded, dizzy or nauseated, and the feeling does not go away.  You notice new bruising at the needle insertion site.  You feel more weak or tired than normal.  You develop a fever. SEEK IMMEDIATE MEDICAL CARE IF:   There is increased bleeding, pain, or swelling from the needle insertion site.  You have severe nausea or vomiting.  You have chest pain.  You have trouble breathing. MAKE SURE YOU:  Understand these instructions.  Will watch your condition.  Will get help right away if you are not doing well or get worse. Document Released: 01/02/2011 Document Revised: 10/23/2011 Document Reviewed: 01/02/2011 [employer] Patient Information 2015 [employer]. This information is not intended to replace advice given to you by your health care provider. Make sure you discuss any questions you have with your health care provider.  

## 2014-02-20 ENCOUNTER — Other Ambulatory Visit: Payer: Medicare HMO

## 2014-02-25 ENCOUNTER — Other Ambulatory Visit: Payer: Self-pay | Admitting: *Deleted

## 2014-02-26 ENCOUNTER — Telehealth: Payer: Self-pay | Admitting: Oncology

## 2014-02-26 NOTE — Telephone Encounter (Signed)
S/w the pt and she is aware of her lab appt next Monday.

## 2014-03-02 ENCOUNTER — Other Ambulatory Visit (HOSPITAL_BASED_OUTPATIENT_CLINIC_OR_DEPARTMENT_OTHER): Payer: Medicare HMO

## 2014-03-02 ENCOUNTER — Other Ambulatory Visit: Payer: Self-pay | Admitting: Gastroenterology

## 2014-03-02 DIAGNOSIS — D45 Polycythemia vera: Secondary | ICD-10-CM

## 2014-03-02 DIAGNOSIS — Z139 Encounter for screening, unspecified: Secondary | ICD-10-CM

## 2014-03-02 LAB — CBC WITH DIFFERENTIAL/PLATELET
BASO%: 1.5 % (ref 0.0–2.0)
Basophils Absolute: 0.2 10*3/uL — ABNORMAL HIGH (ref 0.0–0.1)
EOS ABS: 0.5 10*3/uL (ref 0.0–0.5)
EOS%: 4.1 % (ref 0.0–7.0)
HCT: 42.2 % (ref 34.8–46.6)
HGB: 12.4 g/dL (ref 11.6–15.9)
LYMPH#: 1.4 10*3/uL (ref 0.9–3.3)
LYMPH%: 12 % — ABNORMAL LOW (ref 14.0–49.7)
MCH: 20.9 pg — ABNORMAL LOW (ref 25.1–34.0)
MCHC: 29.4 g/dL — ABNORMAL LOW (ref 31.5–36.0)
MCV: 71.3 fL — ABNORMAL LOW (ref 79.5–101.0)
MONO#: 0.8 10*3/uL (ref 0.1–0.9)
MONO%: 6.8 % (ref 0.0–14.0)
NEUT%: 75.6 % (ref 38.4–76.8)
NEUTROS ABS: 8.6 10*3/uL — AB (ref 1.5–6.5)
RBC: 5.92 10*6/uL — ABNORMAL HIGH (ref 3.70–5.45)
RDW: 23.4 % — ABNORMAL HIGH (ref 11.2–14.5)
WBC: 11.3 10*3/uL — AB (ref 3.9–10.3)
nRBC: 0 % (ref 0–0)

## 2014-03-04 ENCOUNTER — Telehealth: Payer: Self-pay | Admitting: *Deleted

## 2014-03-04 NOTE — Telephone Encounter (Signed)
Message copied by Wardell Heath on Wed Mar 04, 2014  9:40 AM ------      Message from: Owens Shark      Created: Tue Mar 03, 2014  5:16 PM       Please let her know platelets are more elevated. Increase Hydrea to 1500 mg all days except 1000 mg on Mondays, Wednesdays and Fridays. Followup as scheduled.      ----- Message -----         From: [facility]         Sent: 03/02/2014  11:54 AM           To: Owens Shark, NP                   ------

## 2014-03-04 NOTE — Telephone Encounter (Signed)
Left Message for patient to call back regarding lab results.   

## 2014-03-05 ENCOUNTER — Telehealth: Payer: Self-pay | Admitting: *Deleted

## 2014-03-05 NOTE — Telephone Encounter (Signed)
Message copied by Wardell Heath on Thu Mar 05, 2014  2:25 PM ------      Message from: Ned Card K      Created: Tue Mar 03, 2014  5:16 PM       Please let her know platelets are more elevated. Increase Hydrea to 1500 mg all days except 1000 mg on Mondays, Wednesdays and Fridays. Followup as scheduled.      ----- Message -----         From: [facility]         Sent: 03/02/2014  11:54 AM           To: Owens Shark, NP                   ------

## 2014-03-05 NOTE — Telephone Encounter (Signed)
Called and informed patient that platelets are more elevated.  Also, informed patient to increase Hydrea to 1500 mg all days except 1000 mg on Mondays, Wednesdays, and Fridays and to follow up as scheduled. Per Alejandra [name]. Thomas.  Patient verbalized understanding.

## 2014-03-20 ENCOUNTER — Telehealth: Payer: Self-pay | Admitting: Oncology

## 2014-03-20 ENCOUNTER — Other Ambulatory Visit (HOSPITAL_BASED_OUTPATIENT_CLINIC_OR_DEPARTMENT_OTHER): Payer: Medicare HMO

## 2014-03-20 ENCOUNTER — Ambulatory Visit (HOSPITAL_BASED_OUTPATIENT_CLINIC_OR_DEPARTMENT_OTHER): Payer: Medicare HMO | Admitting: Oncology

## 2014-03-20 VITALS — BP 159/80 | HR 76 | Temp 98.6°F | Resp 18 | Ht 61.0 in | Wt 144.7 lb

## 2014-03-20 DIAGNOSIS — I10 Essential (primary) hypertension: Secondary | ICD-10-CM

## 2014-03-20 DIAGNOSIS — D473 Essential (hemorrhagic) thrombocythemia: Secondary | ICD-10-CM

## 2014-03-20 DIAGNOSIS — D45 Polycythemia vera: Secondary | ICD-10-CM

## 2014-03-20 LAB — COMPREHENSIVE METABOLIC PANEL (CC13)
ALT: 14 U/L (ref 0–55)
ANION GAP: 7 meq/L (ref 3–11)
AST: 21 U/L (ref 5–34)
Albumin: 3.6 g/dL (ref 3.5–5.0)
Alkaline Phosphatase: 99 U/L (ref 40–150)
BUN: 17.6 mg/dL (ref 7.0–26.0)
CALCIUM: 9.5 mg/dL (ref 8.4–10.4)
CO2: 28 meq/L (ref 22–29)
CREATININE: 0.8 mg/dL (ref 0.6–1.1)
Chloride: 108 mEq/L (ref 98–109)
Glucose: 106 mg/dl (ref 70–140)
Potassium: 4 mEq/L (ref 3.5–5.1)
Sodium: 143 mEq/L (ref 136–145)
Total Bilirubin: 0.46 mg/dL (ref 0.20–1.20)
Total Protein: 8 g/dL (ref 6.4–8.3)

## 2014-03-20 LAB — CBC WITH DIFFERENTIAL/PLATELET
BASO%: 2.1 % — ABNORMAL HIGH (ref 0.0–2.0)
BASOS ABS: 0.2 10*3/uL — AB (ref 0.0–0.1)
EOS%: 4.4 % (ref 0.0–7.0)
Eosinophils Absolute: 0.4 10*3/uL (ref 0.0–0.5)
HEMATOCRIT: 41.9 % (ref 34.8–46.6)
HGB: 12.5 g/dL (ref 11.6–15.9)
LYMPH%: 11 % — ABNORMAL LOW (ref 14.0–49.7)
MCH: 21.2 pg — ABNORMAL LOW (ref 25.1–34.0)
MCHC: 29.8 g/dL — ABNORMAL LOW (ref 31.5–36.0)
MCV: 71.2 fL — ABNORMAL LOW (ref 79.5–101.0)
MONO#: 0.5 10*3/uL (ref 0.1–0.9)
MONO%: 5 % (ref 0.0–14.0)
NEUT%: 77.5 % — AB (ref 38.4–76.8)
NEUTROS ABS: 7.8 10*3/uL — AB (ref 1.5–6.5)
Platelets: 1041 10*3/uL — ABNORMAL HIGH (ref 145–400)
RBC: 5.88 10*6/uL — ABNORMAL HIGH (ref 3.70–5.45)
RDW: 23.6 % — AB (ref 11.2–14.5)
WBC: 10.1 10*3/uL (ref 3.9–10.3)
lymph#: 1.1 10*3/uL (ref 0.9–3.3)

## 2014-03-20 NOTE — Progress Notes (Signed)
  [facility] OFFICE PROGRESS NOTE   Diagnosis: Polycythemia vera  INTERVAL HISTORY:   Alejandra [name] returns as scheduled.. she continues hydroxyurea. She completed a phlebotomy treatment on 02/12/2014. She reports tolerating the phlebotomy well. No bleeding or symptom of thrombosis. She continues to have discomfort at the right upper thigh and groin. She was placed on Mobic by orthopedics.   Objective:  Vital signs in last 24 hours:  Blood pressure 159/80, pulse 76, temperature 98.6 F (37 C), temperature source Oral, resp. rate 18, height 5\' 1"  (1.549 m), weight 144 lb 11.2 oz (65.635 kg), SpO2 99.00%.    HEENT: ? Tiny ulcer at the left buccal mucosa Resp: Lungs clear bilaterally Cardio: Regular rate and rhythm GI: No hepatosplenomegaly Vascular: No leg edema Musculoskeletal: No pain with motion at the right hip    Lab Results:  Lab Results  Component Value Date   WBC 10.1 03/20/2014   HGB 12.5 03/20/2014   HCT 41.9 03/20/2014   MCV 71.2* 03/20/2014   PLT 1,041* 03/20/2014   NEUTROABS 7.8* 03/20/2014     Medications: I have reviewed the patient's current medications.  Assessment/Plan: 1. Polycythemia vera on hydroxyurea. Last phlebotomy 02/12/2014 2. Hypertension, followed by Dr. Montez Morita.  3. History of hyperpigmentation at the hands. Question related to hydroxyurea. 4. Hip fracture February 2013. She underwent a bipolar hip arthroplasty on 04/23/2012. 5. Thrombocytosis. Most likely secondary to polycythemia vera and iron deficiency.    Disposition:  The hemoglobin and hematocrit are close to the goal range today. She will continue hydroxyurea at the current dose. Alejandra [name] will return for a lab visit in 6 weeks and an office visit in 3 months. She will contact her orthopedic physician to discuss taking diclofenac and meloxicam.  Betsy Coder, MD  03/20/2014  12:23 PM

## 2014-03-20 NOTE — Telephone Encounter (Signed)
pt confirmed labs/ov per 08/07 POF, gave pt AVS...KJ °

## 2014-04-02 ENCOUNTER — Ambulatory Visit
Admission: RE | Admit: 2014-04-02 | Discharge: 2014-04-02 | Disposition: A | Discharge status: Home / Self Care | Payer: Medicare HMO | Source: Ambulatory Visit | Attending: Gastroenterology | Admitting: Gastroenterology

## 2014-04-02 DIAGNOSIS — Z139 Encounter for screening, unspecified: Secondary | ICD-10-CM

## 2014-05-15 ENCOUNTER — Telehealth: Payer: Self-pay | Admitting: *Deleted

## 2014-05-15 NOTE — Telephone Encounter (Signed)
Open by misake 

## 2014-06-24 ENCOUNTER — Telehealth: Payer: Self-pay | Admitting: Nurse Practitioner

## 2014-06-24 ENCOUNTER — Other Ambulatory Visit (HOSPITAL_BASED_OUTPATIENT_CLINIC_OR_DEPARTMENT_OTHER): Payer: Medicare HMO

## 2014-06-24 ENCOUNTER — Ambulatory Visit (HOSPITAL_BASED_OUTPATIENT_CLINIC_OR_DEPARTMENT_OTHER): Payer: Medicare HMO | Admitting: Nurse Practitioner

## 2014-06-24 VITALS — BP 126/92 | HR 100 | Temp 98.6°F | Resp 18 | Ht 61.0 in | Wt 142.5 lb

## 2014-06-24 DIAGNOSIS — D473 Essential (hemorrhagic) thrombocythemia: Secondary | ICD-10-CM

## 2014-06-24 DIAGNOSIS — D45 Polycythemia vera: Secondary | ICD-10-CM

## 2014-06-24 DIAGNOSIS — I10 Essential (primary) hypertension: Secondary | ICD-10-CM

## 2014-06-24 LAB — CBC WITH DIFFERENTIAL/PLATELET
BASO%: 2.2 % — AB (ref 0.0–2.0)
Basophils Absolute: 0.2 10*3/uL — ABNORMAL HIGH (ref 0.0–0.1)
EOS%: 4.8 % (ref 0.0–7.0)
Eosinophils Absolute: 0.5 10*3/uL (ref 0.0–0.5)
HCT: 43.9 % (ref 34.8–46.6)
HEMOGLOBIN: 12.6 g/dL (ref 11.6–15.9)
LYMPH%: 10.7 % — ABNORMAL LOW (ref 14.0–49.7)
MCH: 20.6 pg — AB (ref 25.1–34.0)
MCHC: 28.7 g/dL — ABNORMAL LOW (ref 31.5–36.0)
MCV: 71.7 fL — AB (ref 79.5–101.0)
MONO#: 0.5 10*3/uL (ref 0.1–0.9)
MONO%: 5.3 % (ref 0.0–14.0)
NEUT#: 7.7 10*3/uL — ABNORMAL HIGH (ref 1.5–6.5)
NEUT%: 77 % — ABNORMAL HIGH (ref 38.4–76.8)
Platelets: 910 10*3/uL — ABNORMAL HIGH (ref 145–400)
RBC: 6.12 10*6/uL — AB (ref 3.70–5.45)
RDW: 22.8 % — AB (ref 11.2–14.5)
WBC: 10 10*3/uL (ref 3.9–10.3)
lymph#: 1.1 10*3/uL (ref 0.9–3.3)

## 2014-06-24 NOTE — Telephone Encounter (Signed)
Pt confirmed labs/ov per 11/11 POF, gave pt AVS..... KJ °

## 2014-06-24 NOTE — Progress Notes (Signed)
  [facility] OFFICE PROGRESS NOTE   Diagnosis:  Polycythemia vera  INTERVAL HISTORY:   Alejandra [name] returns as scheduled. She feels well. She continues hydroxyurea. She denies nausea/vomiting. No mouth sores. Bowels moving regularly. No skin rash. No bleeding. She plans to begin physical therapy soon due to "stiffness and soreness" at the right hip.  Objective:  Vital signs in last 24 hours:  Blood pressure 126/92, pulse 100, temperature 98.6 F (37 C), temperature source Oral, resp. rate 18, height 5\' 1"  (1.549 m), weight 142 lb 8 oz (64.638 kg), SpO2 98 %.    HEENT: no thrush or ulcers. Resp:  Lungs clear bilaterally. Cardio: regular rate and rhythm. GI: abdomen soft and nontender. No organomegaly. Vascular: no leg edema.  Lab Results:  Lab Results  Component Value Date   WBC 10.0 06/24/2014   HGB 12.6 06/24/2014   HCT 43.9 06/24/2014   MCV 71.7* 06/24/2014   PLT 910* 06/24/2014   NEUTROABS 7.7* 06/24/2014    Imaging:  No results found.  Medications: I have reviewed the patient's current medications.  Assessment/Plan: 1. Polycythemia vera on hydroxyurea. Last phlebotomy 02/12/2014 2. Hypertension, followed by Alejandra [name].  3. History of hyperpigmentation at the hands. Question related to hydroxyurea. 4. Hip fracture February 2013. She underwent a bipolar hip arthroplasty on 04/23/2012. 5. Thrombocytosis. Most likely secondary to polycythemia vera and iron deficiency.   Disposition:Alejandra [name] appears stable. Hemoglobin/hematocrit remain close to the goal range. She will continue hydroxyurea at the current dose. She will return for labs and a followup visit in 3 months. She will contact the office in the interim with any problems.  Plan reviewed with Dr. Benay Spice.    Ned Card ANP/GNP-BC   06/24/2014  12:19 PM

## 2014-09-24 ENCOUNTER — Other Ambulatory Visit: Payer: Medicare HMO

## 2014-09-24 ENCOUNTER — Telehealth: Payer: Self-pay | Admitting: Oncology

## 2014-09-24 ENCOUNTER — Ambulatory Visit: Payer: Medicare HMO | Admitting: Oncology

## 2014-09-24 NOTE — Telephone Encounter (Signed)
Pt called to r/s and confirm updated sch, pt states her bursitis is acting up..... KJ

## 2014-09-27 ENCOUNTER — Encounter (HOSPITAL_COMMUNITY): Payer: Self-pay | Admitting: *Deleted

## 2014-09-27 ENCOUNTER — Emergency Department (HOSPITAL_COMMUNITY): Payer: Medicare HMO

## 2014-09-27 ENCOUNTER — Inpatient Hospital Stay (HOSPITAL_COMMUNITY)
Admission: EM | Admit: 2014-09-27 | Discharge: 2014-09-29 | DRG: 603 | Disposition: A | Discharge status: Home Health | Payer: Medicare HMO | Attending: Internal Medicine | Admitting: Internal Medicine

## 2014-09-27 DIAGNOSIS — R42 Dizziness and giddiness: Secondary | ICD-10-CM | POA: Diagnosis present

## 2014-09-27 DIAGNOSIS — Z7982 Long term (current) use of aspirin: Secondary | ICD-10-CM

## 2014-09-27 DIAGNOSIS — Z79891 Long term (current) use of opiate analgesic: Secondary | ICD-10-CM

## 2014-09-27 DIAGNOSIS — D473 Essential (hemorrhagic) thrombocythemia: Secondary | ICD-10-CM | POA: Diagnosis present

## 2014-09-27 DIAGNOSIS — M1990 Unspecified osteoarthritis, unspecified site: Secondary | ICD-10-CM | POA: Diagnosis present

## 2014-09-27 DIAGNOSIS — D75839 Thrombocytosis, unspecified: Secondary | ICD-10-CM | POA: Diagnosis present

## 2014-09-27 DIAGNOSIS — L03114 Cellulitis of left upper limb: Principal | ICD-10-CM | POA: Diagnosis present

## 2014-09-27 DIAGNOSIS — M87051 Idiopathic aseptic necrosis of right femur: Secondary | ICD-10-CM | POA: Diagnosis present

## 2014-09-27 DIAGNOSIS — G8929 Other chronic pain: Secondary | ICD-10-CM | POA: Diagnosis present

## 2014-09-27 DIAGNOSIS — I10 Essential (primary) hypertension: Secondary | ICD-10-CM | POA: Diagnosis present

## 2014-09-27 DIAGNOSIS — M6282 Rhabdomyolysis: Secondary | ICD-10-CM | POA: Diagnosis present

## 2014-09-27 DIAGNOSIS — L03119 Cellulitis of unspecified part of limb: Secondary | ICD-10-CM | POA: Diagnosis present

## 2014-09-27 DIAGNOSIS — M87851 Other osteonecrosis, right femur: Secondary | ICD-10-CM | POA: Diagnosis present

## 2014-09-27 DIAGNOSIS — D72829 Elevated white blood cell count, unspecified: Secondary | ICD-10-CM

## 2014-09-27 DIAGNOSIS — R531 Weakness: Secondary | ICD-10-CM

## 2014-09-27 DIAGNOSIS — Z87891 Personal history of nicotine dependence: Secondary | ICD-10-CM

## 2014-09-27 DIAGNOSIS — D45 Polycythemia vera: Secondary | ICD-10-CM | POA: Diagnosis present

## 2014-09-27 DIAGNOSIS — H409 Unspecified glaucoma: Secondary | ICD-10-CM | POA: Diagnosis present

## 2014-09-27 DIAGNOSIS — E876 Hypokalemia: Secondary | ICD-10-CM | POA: Diagnosis present

## 2014-09-27 DIAGNOSIS — Z79899 Other long term (current) drug therapy: Secondary | ICD-10-CM

## 2014-09-27 DIAGNOSIS — M25551 Pain in right hip: Secondary | ICD-10-CM | POA: Diagnosis present

## 2014-09-27 HISTORY — DX: Cellulitis of unspecified part of limb: L03.119

## 2014-09-27 HISTORY — DX: Rhabdomyolysis: M62.82

## 2014-09-27 LAB — CBC WITH DIFFERENTIAL/PLATELET
BASOS ABS: 0.2 10*3/uL — AB (ref 0.0–0.1)
Basophils Relative: 1 % (ref 0–1)
EOS ABS: 0 10*3/uL (ref 0.0–0.7)
EOS PCT: 0 % (ref 0–5)
HEMATOCRIT: 50.9 % — AB (ref 36.0–46.0)
HEMOGLOBIN: 15 g/dL (ref 12.0–15.0)
Lymphocytes Relative: 5 % — ABNORMAL LOW (ref 12–46)
Lymphs Abs: 1.1 10*3/uL (ref 0.7–4.0)
MCH: 19.9 pg — AB (ref 26.0–34.0)
MCHC: 29.5 g/dL — AB (ref 30.0–36.0)
MCV: 67.4 fL — AB (ref 78.0–100.0)
Monocytes Absolute: 1.3 10*3/uL — ABNORMAL HIGH (ref 0.1–1.0)
Monocytes Relative: 6 % (ref 3–12)
Neutro Abs: 18.6 10*3/uL — ABNORMAL HIGH (ref 1.7–7.7)
Neutrophils Relative %: 88 % — ABNORMAL HIGH (ref 43–77)
Platelets: 1419 10*3/uL (ref 150–400)
RBC: 7.55 MIL/uL — ABNORMAL HIGH (ref 3.87–5.11)
RDW: 22.4 % — ABNORMAL HIGH (ref 11.5–15.5)
WBC: 21.2 10*3/uL — AB (ref 4.0–10.5)

## 2014-09-27 LAB — CK: CK TOTAL: 511 U/L — AB (ref 7–177)

## 2014-09-27 LAB — COMPREHENSIVE METABOLIC PANEL
ALK PHOS: 93 U/L (ref 39–117)
ALT: 20 U/L (ref 0–35)
ANION GAP: 6 (ref 5–15)
AST: 42 U/L — ABNORMAL HIGH (ref 0–37)
Albumin: 3.8 g/dL (ref 3.5–5.2)
BILIRUBIN TOTAL: 1.4 mg/dL — AB (ref 0.3–1.2)
BUN: 18 mg/dL (ref 6–23)
CHLORIDE: 109 mmol/L (ref 96–112)
CO2: 24 mmol/L (ref 19–32)
Calcium: 9.1 mg/dL (ref 8.4–10.5)
Creatinine, Ser: 0.96 mg/dL (ref 0.50–1.10)
GFR calc non Af Amer: 57 mL/min — ABNORMAL LOW (ref >90)
GFR, EST AFRICAN AMERICAN: 66 mL/min — AB (ref >90)
GLUCOSE: 125 mg/dL — AB (ref 70–99)
Potassium: 3.9 mmol/L (ref 3.5–5.1)
SODIUM: 139 mmol/L (ref 135–145)
TOTAL PROTEIN: 8.4 g/dL — AB (ref 6.0–8.3)

## 2014-09-27 LAB — URINALYSIS, ROUTINE W REFLEX MICROSCOPIC
GLUCOSE, UA: NEGATIVE mg/dL
KETONES UR: 15 mg/dL — AB
LEUKOCYTES UA: NEGATIVE
NITRITE: NEGATIVE
PROTEIN: 100 mg/dL — AB
Specific Gravity, Urine: 1.028 (ref 1.005–1.030)
UROBILINOGEN UA: 1 mg/dL (ref 0.0–1.0)
pH: 5 (ref 5.0–8.0)

## 2014-09-27 LAB — URINE MICROSCOPIC-ADD ON

## 2014-09-27 LAB — TROPONIN I: TROPONIN I: 0.03 ng/mL (ref <0.031)

## 2014-09-27 MED ORDER — SODIUM CHLORIDE 0.9 % IV BOLUS (SEPSIS)
500.0000 mL | Freq: Once | INTRAVENOUS | Status: AC
  Administered 2014-09-27: 500 mL via INTRAVENOUS

## 2014-09-27 MED ORDER — SODIUM CHLORIDE 0.9 % IV SOLN
Freq: Once | INTRAVENOUS | Status: AC
  Administered 2014-09-28: via INTRAVENOUS

## 2014-09-27 NOTE — ED Provider Notes (Signed)
CSN: [id number]     Arrival date & time 09/27/14  2141 History   First MD Initiated Contact with Patient 09/27/14 2147     Chief Complaint  Patient presents with  . Couldn't get out of her tub       HPI  Patient presents for evaluation after spending over 24 hours in her bathtub. Family states that she moved into her bathtub as early as noon time yesterday. Patient states she thinks it was 10 PM yesterday. Family tried calling her several times during the course today and could not get a hold of her. They went to check on her this evening and she was in her bathtub.  She states that she normally isn't out of her bathtub without difficulty. However last night she felt weaker. She is in the bathtub for a "normal amount of time" states she simply could not get out because of generalized weakness. Denies any pain limited her. Denies any headache or cyst symptoms. Had a normal day through the day other than generalized weakness.  Planes of some pain in the posterior aspect of her left shoulder where she got a flu shot on Thursday, 3 days ago. No fever shakes chills rigors.  Has urinary frequency each morning this is not changed no new dysuria or hematuria flank pain or fever.   Past Medical History  Diagnosis Date  . Arthritis   . Glaucoma   . Hypertension   . Fracture 10/05/11    "fell and broke right hip"  . Fracture of femoral neck, right 10/12/2011  . Glaucoma     bilateral  . Polycythemia vera(238.4)   . Avascular necrosis of femur head, right 04/23/2012  . Thrombocytosis 04/23/2012  . Cellulitis of shoulder 09/28/2014  . Rhabdomyolysis 09/28/2014   Past Surgical History  Procedure Laterality Date  . Hip pinning,cannulated  10/12/2011    Procedure: CANNULATED HIP PINNING;  Surgeon: Johnny Bridge, MD;  Location: [place];  Service: Orthopedics;  Laterality: Right;  . Colonoscopy    . Hardware removal  04/23/2012    Procedure: HARDWARE REMOVAL;  Surgeon: Johnny Bridge, MD;  Location:  [place];  Service: Orthopedics;  Laterality: Right;  . Hip arthroplasty  04/23/2012    Procedure: ARTHROPLASTY BIPOLAR HIP;  Surgeon: Johnny Bridge, MD;  Location: [place];  Service: Orthopedics;  Laterality: Right;   History reviewed. No pertinent family history. History  Substance Use Topics  . Smoking status: Former Smoker -- 0.12 packs/day    Types: Cigarettes    Quit date: 08/15/1991  . Smokeless tobacco: Never Used  . Alcohol Use: No     Comment: 10/12/11 "did drink in my younger; don't drink at all now"   OB History    No data available     Review of Systems  Constitutional: Negative for fever, chills, diaphoresis, appetite change and fatigue.  HENT: Negative for mouth sores, sore throat and trouble swallowing.   Eyes: Negative for visual disturbance.  Respiratory: Negative for cough, chest tightness, shortness of breath and wheezing.   Cardiovascular: Negative for chest pain.  Gastrointestinal: Negative for nausea, vomiting, abdominal pain, diarrhea and abdominal distention.  Endocrine: Negative for polydipsia, polyphagia and polyuria.  Genitourinary: Negative for dysuria, frequency and hematuria.  Musculoskeletal: Negative for gait problem.  Skin: Positive for rash. Negative for color change and pallor.       Discomfort on her left posterior deltoid from her "flu shot".  Neurological: Positive for weakness. Negative for dizziness, syncope,  light-headedness and headaches.  Hematological: Does not bruise/bleed easily.  Psychiatric/Behavioral: Negative for behavioral problems and confusion.      Allergies  Review of patient's allergies indicates no known allergies.  Home Medications   Prior to Admission medications   Medication Sig Start Date End Date Taking? Authorizing Provider  acetaminophen (TYLENOL) 325 MG tablet Take 650 mg by mouth every 6 (six) hours as needed for moderate pain.    Yes Historical Provider, MD  ALPRAZolam (XANAX) 0.25 MG tablet Take 0.25 mg by  mouth 2 (two) times daily as needed for anxiety.    Yes Historical Provider, MD  amLODipine (NORVASC) 10 MG tablet Take 10 mg by mouth daily.   Yes Historical Provider, MD  aspirin EC 81 MG tablet Take 81 mg by mouth daily.   Yes Historical Provider, MD  benazepril (LOTENSIN) 40 MG tablet Take 40 mg by mouth daily.   Yes Historical Provider, MD  carvedilol (COREG) 3.125 MG tablet Take 3.125 mg by mouth 2 (two) times daily with a meal.  11/18/13  Yes Historical Provider, MD  diclofenac (VOLTAREN) 50 MG EC tablet Take 50 mg by mouth daily as needed for moderate pain.  01/27/14  Yes Historical Provider, MD  dorzolamide-timolol (COSOPT) 22.3-6.8 MG/ML ophthalmic solution Place 1 drop into both eyes 2 (two) times daily.   Yes Historical Provider, MD  HYDROcodone-acetaminophen [facility]) 10-325 MG per tablet Take one tablet every 6 hours as needed for pain 09/20/13  Yes Domenic Moras, PA-C  hydroxyurea (HYDREA) 500 MG capsule Take 1,000-1,500 mg by mouth daily. 1000 mg (2 pills) all days except 1500 mg (3 pills) on Mondays and Thursdays.   Yes Historical Provider, MD  loratadine (CLARITIN) 10 MG tablet Take 10 mg by mouth daily as needed for allergies.   Yes Historical Provider, MD  meclizine (ANTIVERT) 25 MG tablet Take 25 mg by mouth 3 (three) times daily as needed for dizziness.  03/25/14  Yes Historical Provider, MD  meloxicam (MOBIC) 15 MG tablet Take 15 mg by mouth daily.  12/16/13  Yes Historical Provider, MD  cephALEXin (KEFLEX) 500 MG capsule Take 1 capsule (500 mg total) by mouth 4 (four) times daily. 09/29/14   Christina P Rama, MD   BP 124/71 mmHg  Pulse 71  Temp(Src) 98.1 F (36.7 C) (Oral)  Resp 20  Ht 5\' 3"  (1.6 m)  Wt 138 lb 0.1 oz (62.6 kg)  BMI 24.45 kg/m2  SpO2 100% Physical Exam  Musculoskeletal:       Arms: Neurological:  Patient has 4 over 5 strength to the 4 extremities. She has intact DTR to the lower extremities.  Skin:  Erythema to the left posterior shoulder. No sign of ulceration  or skin breakdown to the back or buttock spray she does complain of tenderness and pain in her buttocks.    ED Course  Procedures (including critical care time) Labs Review Labs Reviewed  CBC WITH DIFFERENTIAL/PLATELET - Abnormal; Notable for the following:    WBC 21.2 (*)    RBC 7.55 (*)    HCT 50.9 (*)    MCV 67.4 (*)    MCH 19.9 (*)    MCHC 29.5 (*)    RDW 22.4 (*)    Platelets 1419 (*)    Neutrophils Relative % 88 (*)    Lymphocytes Relative 5 (*)    Neutro Abs 18.6 (*)    Monocytes Absolute 1.3 (*)    Basophils Absolute 0.2 (*)    All other components within  normal limits  COMPREHENSIVE METABOLIC PANEL - Abnormal; Notable for the following:    Glucose, Bld 125 (*)    Total Protein 8.4 (*)    AST 42 (*)    Total Bilirubin 1.4 (*)    GFR calc non Af Amer 57 (*)    GFR calc Af Amer 66 (*)    All other components within normal limits  CK - Abnormal; Notable for the following:    Total CK 511 (*)    All other components within normal limits  URINALYSIS, ROUTINE W REFLEX MICROSCOPIC - Abnormal; Notable for the following:    Color, Urine AMBER (*)    APPearance CLOUDY (*)    Hgb urine dipstick TRACE (*)    Bilirubin Urine MODERATE (*)    Ketones, ur 15 (*)    Protein, ur 100 (*)    All other components within normal limits  URINE MICROSCOPIC-ADD ON - Abnormal; Notable for the following:    Casts HYALINE CASTS (*)    All other components within normal limits  CK - Abnormal; Notable for the following:    Total CK 394 (*)    All other components within normal limits  COMPREHENSIVE METABOLIC PANEL - Abnormal; Notable for the following:    Potassium 3.1 (*)    GFR calc non Af Amer 67 (*)    GFR calc Af Amer 77 (*)    All other components within normal limits  CBC - Abnormal; Notable for the following:    WBC 17.4 (*)    RBC 7.25 (*)    HCT 48.7 (*)    MCV 67.2 (*)    MCH 19.7 (*)    MCHC 29.4 (*)    RDW 22.2 (*)    Platelets 1055 (*)    All other components  within normal limits  PROTIME-INR - Abnormal; Notable for the following:    Prothrombin Time 17.4 (*)    All other components within normal limits  BASIC METABOLIC PANEL - Abnormal; Notable for the following:    Chloride 115 (*)    BUN 32 (*)    GFR calc non Af Amer 63 (*)    GFR calc Af Amer 73 (*)    All other components within normal limits  CBC - Abnormal; Notable for the following:    WBC 13.5 (*)    RBC 6.38 (*)    MCV 66.6 (*)    MCH 19.0 (*)    MCHC 28.5 (*)    RDW 22.7 (*)    Platelets 1252 (*)    All other components within normal limits  CK - Abnormal; Notable for the following:    Total CK 199 (*)    All other components within normal limits  URINE CULTURE  CULTURE, BLOOD (ROUTINE X 2)  CULTURE, BLOOD (ROUTINE X 2)  TROPONIN I  PATHOLOGIST SMEAR REVIEW  TSH  LACTIC ACID, PLASMA  PROCALCITONIN  GLUCOSE, CAPILLARY  GLUCOSE, CAPILLARY    Imaging Review No results found.   EKG Interpretation   Date/Time:  Sunday September 27 2014 21:49:29 EST Ventricular Rate:  90 PR Interval:  161 QRS Duration: 88 QT Interval:  339 QTC Calculation: 415 R Axis:   -28 Text Interpretation:  Sinus rhythm Atrial premature complex LVH with  secondary repolarization abnormality ED PHYSICIAN INTERPRETATION AVAILABLE  IN [facility] Confirmed by TEST, Record ([id number]) on 09/29/2014 6:51:43  AM      MDM   Final diagnoses:  Weakness  Leukocytosis  EKG shows no acute changes. Nonfocal neurological exam. However, she cannot sit in bed from a laying position. Continues to exhibit generalized weakness without focal abnormality. Normal DTRs a sudden do not think this represents Guillain-Barr after her flu vaccine. No UTI. Does have leukocytosis. However she is afebrile on rectal temperature. History of polycythemia. Hemoglobin is 15, thrombocytosis at 1419. Normal calcium and electrolytes. CPK only 511.    Tanna Furry, MD 09/30/14 [phone]

## 2014-09-27 NOTE — ED Notes (Addendum)
Patient's family called EMS after checking on her this evening  Patient states that she was taking a bath yesterday morning at 1000 and couldn't get out of the tub by herself Patient denies falling Patient denies hitting head Patient denies chest pain or SOB while bathing, when she was unable to get out of the tub and here now in ED

## 2014-09-27 NOTE — ED Notes (Signed)
MD at bedside. 

## 2014-09-27 NOTE — ED Notes (Signed)
Patient's family at bedside and asking to speak with EDP Dr. Jeneen Rinks made aware

## 2014-09-27 NOTE — ED Notes (Signed)
Bed: ST41 Expected date:  Expected time:  Means of arrival:  Comments: EMS fall yesterday - still laying in shower

## 2014-09-27 NOTE — ED Notes (Signed)
Per nurse Raquel Sarna she will get labs when start IV

## 2014-09-27 NOTE — ED Notes (Signed)
Portable CXR at bedside Will place PIV, obtain labs and medicate patient when CXR completed

## 2014-09-27 NOTE — ED Notes (Signed)
Patient states that she recently received the flu vaccine to the left deltoid Left deltoid area noted to be red and warm to touch--patient reports area is sore Dr. Jeneen Rinks at bedside and is aware

## 2014-09-28 ENCOUNTER — Encounter (HOSPITAL_COMMUNITY): Payer: Self-pay

## 2014-09-28 DIAGNOSIS — D473 Essential (hemorrhagic) thrombocythemia: Secondary | ICD-10-CM | POA: Diagnosis present

## 2014-09-28 DIAGNOSIS — I10 Essential (primary) hypertension: Secondary | ICD-10-CM | POA: Diagnosis present

## 2014-09-28 DIAGNOSIS — M6282 Rhabdomyolysis: Secondary | ICD-10-CM | POA: Diagnosis present

## 2014-09-28 DIAGNOSIS — R42 Dizziness and giddiness: Secondary | ICD-10-CM | POA: Diagnosis present

## 2014-09-28 DIAGNOSIS — L03114 Cellulitis of left upper limb: Secondary | ICD-10-CM | POA: Diagnosis present

## 2014-09-28 DIAGNOSIS — Z7982 Long term (current) use of aspirin: Secondary | ICD-10-CM | POA: Diagnosis not present

## 2014-09-28 DIAGNOSIS — L03119 Cellulitis of unspecified part of limb: Secondary | ICD-10-CM

## 2014-09-28 DIAGNOSIS — D45 Polycythemia vera: Secondary | ICD-10-CM | POA: Diagnosis present

## 2014-09-28 DIAGNOSIS — R531 Weakness: Secondary | ICD-10-CM | POA: Diagnosis present

## 2014-09-28 DIAGNOSIS — H409 Unspecified glaucoma: Secondary | ICD-10-CM | POA: Diagnosis present

## 2014-09-28 DIAGNOSIS — M1990 Unspecified osteoarthritis, unspecified site: Secondary | ICD-10-CM | POA: Diagnosis present

## 2014-09-28 DIAGNOSIS — Z79899 Other long term (current) drug therapy: Secondary | ICD-10-CM | POA: Diagnosis not present

## 2014-09-28 DIAGNOSIS — Z87891 Personal history of nicotine dependence: Secondary | ICD-10-CM | POA: Diagnosis not present

## 2014-09-28 DIAGNOSIS — M87051 Idiopathic aseptic necrosis of right femur: Secondary | ICD-10-CM | POA: Diagnosis not present

## 2014-09-28 DIAGNOSIS — M87851 Other osteonecrosis, right femur: Secondary | ICD-10-CM | POA: Diagnosis present

## 2014-09-28 DIAGNOSIS — G8929 Other chronic pain: Secondary | ICD-10-CM | POA: Diagnosis present

## 2014-09-28 DIAGNOSIS — Z79891 Long term (current) use of opiate analgesic: Secondary | ICD-10-CM | POA: Diagnosis not present

## 2014-09-28 DIAGNOSIS — E876 Hypokalemia: Secondary | ICD-10-CM | POA: Diagnosis present

## 2014-09-28 DIAGNOSIS — M25551 Pain in right hip: Secondary | ICD-10-CM | POA: Diagnosis present

## 2014-09-28 HISTORY — DX: Rhabdomyolysis: M62.82

## 2014-09-28 HISTORY — DX: Cellulitis of unspecified part of limb: L03.119

## 2014-09-28 LAB — CBC
HCT: 48.7 % — ABNORMAL HIGH (ref 36.0–46.0)
Hemoglobin: 14.3 g/dL (ref 12.0–15.0)
MCH: 19.7 pg — ABNORMAL LOW (ref 26.0–34.0)
MCHC: 29.4 g/dL — AB (ref 30.0–36.0)
MCV: 67.2 fL — ABNORMAL LOW (ref 78.0–100.0)
PLATELETS: 1055 10*3/uL — AB (ref 150–400)
RBC: 7.25 MIL/uL — AB (ref 3.87–5.11)
RDW: 22.2 % — AB (ref 11.5–15.5)
WBC: 17.4 10*3/uL — ABNORMAL HIGH (ref 4.0–10.5)

## 2014-09-28 LAB — COMPREHENSIVE METABOLIC PANEL
ALT: 20 U/L (ref 0–35)
AST: 34 U/L (ref 0–37)
Albumin: 3.6 g/dL (ref 3.5–5.2)
Alkaline Phosphatase: 89 U/L (ref 39–117)
Anion gap: 9 (ref 5–15)
BUN: 19 mg/dL (ref 6–23)
CHLORIDE: 107 mmol/L (ref 96–112)
CO2: 24 mmol/L (ref 19–32)
Calcium: 9 mg/dL (ref 8.4–10.5)
Creatinine, Ser: 0.84 mg/dL (ref 0.50–1.10)
GFR calc Af Amer: 77 mL/min — ABNORMAL LOW (ref >90)
GFR, EST NON AFRICAN AMERICAN: 67 mL/min — AB (ref >90)
Glucose, Bld: 99 mg/dL (ref 70–99)
Potassium: 3.1 mmol/L — ABNORMAL LOW (ref 3.5–5.1)
Sodium: 140 mmol/L (ref 135–145)
TOTAL PROTEIN: 7.9 g/dL (ref 6.0–8.3)
Total Bilirubin: 1.1 mg/dL (ref 0.3–1.2)

## 2014-09-28 LAB — PROTIME-INR
INR: 1.41 (ref 0.00–1.49)
Prothrombin Time: 17.4 seconds — ABNORMAL HIGH (ref 11.6–15.2)

## 2014-09-28 LAB — CK: CK TOTAL: 394 U/L — AB (ref 7–177)

## 2014-09-28 LAB — TSH: TSH: 1.877 u[IU]/mL (ref 0.350–4.500)

## 2014-09-28 LAB — URINE CULTURE
CULTURE: NO GROWTH
Colony Count: NO GROWTH

## 2014-09-28 LAB — PROCALCITONIN: Procalcitonin: 0.12 ng/mL

## 2014-09-28 LAB — GLUCOSE, CAPILLARY: Glucose-Capillary: 85 mg/dL (ref 70–99)

## 2014-09-28 LAB — LACTIC ACID, PLASMA: Lactic Acid, Venous: 1.3 mmol/L (ref 0.5–2.0)

## 2014-09-28 LAB — PATHOLOGIST SMEAR REVIEW

## 2014-09-28 MED ORDER — LORATADINE 10 MG PO TABS
10.0000 mg | ORAL_TABLET | Freq: Every day | ORAL | Status: DC | PRN
  Filled 2014-09-28: qty 1

## 2014-09-28 MED ORDER — HYDROXYUREA 500 MG PO CAPS
1000.0000 mg | ORAL_CAPSULE | ORAL | Status: DC
  Administered 2014-09-29: 1000 mg via ORAL
  Filled 2014-09-28: qty 2

## 2014-09-28 MED ORDER — AMLODIPINE BESYLATE 10 MG PO TABS
10.0000 mg | ORAL_TABLET | Freq: Every day | ORAL | Status: DC
  Administered 2014-09-28 – 2014-09-29 (×2): 10 mg via ORAL
  Filled 2014-09-28 (×2): qty 1

## 2014-09-28 MED ORDER — ONDANSETRON HCL 4 MG/2ML IJ SOLN: 4.0000 mg | Freq: Four times a day (QID) | INTRAMUSCULAR | Status: DC | PRN

## 2014-09-28 MED ORDER — CARVEDILOL 3.125 MG PO TABS
3.1250 mg | ORAL_TABLET | Freq: Two times a day (BID) | ORAL | Status: DC
  Administered 2014-09-28 – 2014-09-29 (×3): 3.125 mg via ORAL
  Filled 2014-09-28 (×5): qty 1

## 2014-09-28 MED ORDER — MECLIZINE HCL 25 MG PO TABS
25.0000 mg | ORAL_TABLET | Freq: Three times a day (TID) | ORAL | Status: DC | PRN
  Filled 2014-09-28: qty 1

## 2014-09-28 MED ORDER — CLINDAMYCIN PHOSPHATE 600 MG/50ML IV SOLN
600.0000 mg | Freq: Three times a day (TID) | INTRAVENOUS | Status: DC
  Administered 2014-09-28 – 2014-09-29 (×5): 600 mg via INTRAVENOUS
  Filled 2014-09-28 (×6): qty 50

## 2014-09-28 MED ORDER — POTASSIUM CHLORIDE CRYS ER 20 MEQ PO TBCR
20.0000 meq | EXTENDED_RELEASE_TABLET | Freq: Three times a day (TID) | ORAL | Status: DC
  Administered 2014-09-28 (×2): 20 meq via ORAL
  Filled 2014-09-28 (×2): qty 1

## 2014-09-28 MED ORDER — OXYCODONE HCL 5 MG PO TABS: 10.0000 mg | ORAL_TABLET | Freq: Four times a day (QID) | ORAL | Status: DC | PRN

## 2014-09-28 MED ORDER — HYDROXYUREA 500 MG PO CAPS
1500.0000 mg | ORAL_CAPSULE | ORAL | Status: DC
  Administered 2014-09-28: 1500 mg via ORAL
  Filled 2014-09-28: qty 3

## 2014-09-28 MED ORDER — SODIUM CHLORIDE 0.9 % IV SOLN
Freq: Once | INTRAVENOUS | Status: AC
  Administered 2014-09-28: 04:00:00 via INTRAVENOUS

## 2014-09-28 MED ORDER — MORPHINE SULFATE 2 MG/ML IJ SOLN
1.0000 mg | INTRAMUSCULAR | Status: DC | PRN
Start: 2014-09-28 — End: 2014-09-29

## 2014-09-28 MED ORDER — DORZOLAMIDE HCL-TIMOLOL MAL 2-0.5 % OP SOLN
1.0000 [drp] | Freq: Two times a day (BID) | OPHTHALMIC | Status: DC
  Administered 2014-09-28 – 2014-09-29 (×3): 1 [drp] via OPHTHALMIC
  Filled 2014-09-28: qty 10

## 2014-09-28 MED ORDER — HEPARIN SODIUM (PORCINE) 5000 UNIT/ML IJ SOLN
5000.0000 [IU] | Freq: Three times a day (TID) | INTRAMUSCULAR | Status: DC
  Administered 2014-09-28 – 2014-09-29 (×3): 5000 [IU] via SUBCUTANEOUS
  Filled 2014-09-28 (×7): qty 1

## 2014-09-28 MED ORDER — ALPRAZOLAM 0.25 MG PO TABS
0.2500 mg | ORAL_TABLET | Freq: Two times a day (BID) | ORAL | Status: DC | PRN
  Administered 2014-09-28: 0.25 mg via ORAL
  Filled 2014-09-28: qty 1

## 2014-09-28 MED ORDER — BENAZEPRIL HCL 40 MG PO TABS
40.0000 mg | ORAL_TABLET | Freq: Every day | ORAL | Status: DC
  Administered 2014-09-28 – 2014-09-29 (×2): 40 mg via ORAL
  Filled 2014-09-28 (×2): qty 1

## 2014-09-28 MED ORDER — ONDANSETRON HCL 4 MG PO TABS: 4.0000 mg | ORAL_TABLET | Freq: Four times a day (QID) | ORAL | Status: DC | PRN

## 2014-09-28 MED ORDER — ASPIRIN EC 81 MG PO TBEC
81.0000 mg | DELAYED_RELEASE_TABLET | Freq: Every day | ORAL | Status: DC
  Administered 2014-09-28 – 2014-09-29 (×2): 81 mg via ORAL
  Filled 2014-09-28 (×2): qty 1

## 2014-09-28 MED ORDER — POTASSIUM CHLORIDE 20 MEQ/15ML (10%) PO SOLN
40.0000 meq | Freq: Once | ORAL | Status: AC
  Administered 2014-09-28: 40 meq via ORAL
  Filled 2014-09-28: qty 30

## 2014-09-28 MED ORDER — HYDROXYUREA 500 MG PO CAPS: 1000.0000 mg | ORAL_CAPSULE | Freq: Every day | ORAL | Status: DC

## 2014-09-28 MED ORDER — MELOXICAM 15 MG PO TABS
15.0000 mg | ORAL_TABLET | Freq: Every day | ORAL | Status: DC
  Administered 2014-09-28 – 2014-09-29 (×2): 15 mg via ORAL
  Filled 2014-09-28 (×2): qty 1

## 2014-09-28 MED ORDER — SODIUM CHLORIDE 0.9 % IV BOLUS (SEPSIS): 500.0000 mL | Freq: Once | INTRAVENOUS | Status: DC

## 2014-09-28 NOTE — H&P (Addendum)
[facility] History and Physical  Alejandra [name] OJJ:[id number] DOB: 29-Jan-1941 DOA: 09/27/2014  Referring physician: ED physician PCP: Alejandra Demark, MD  Specialists:   Chief Complaint: Weakness  HPI: Alejandra [name] is a 74 y.o. female with past medical history of hypertension, vertigo, glaucoma, arthritis, polycythemia vera, who presents with weakness.  Patient is accompanied by her sister and brother. Per her sister, she probably spent 24 hours in her bathtub. Her sister states that she moved into her bathtub as early as noon time yesterday. Patient states she thinks it was 10 PM yesterday. Family tried calling her several times during the today and could not reach her. They went to check on her this evening and found that patient is in her bathtub. Per family, normally patient was able to take care herself including taking bath. Patient reports that she simply could not get out the bathtub because of generalized weakness. She has vertigo, and is currently on meclizine, but no dizziness today. Patient does not have unilateral weakness, headache, vision change, hearing loss, chest pain, palpitation and cough. She has mild abdominal sore, but no nausea, vomiting or diarrhea. No symptoms for UTI. No leg edema. She has chronic pain over right hip. When I evaluated pt in ED, she could not sit up without help due to generalized weakness.  Of note, she got a flu shot on Tuesday. 2 days later, she started having pain over the injection site to the lateral side of L shoulder. No fever or chill.   In ED, patient was found to have WBC increased from previous 10.0 to 21.2 and platelet increased from 910 on 06/24/14 to1419. Negative urinalysis, negative troponin, CK 511, electrolytes okay, normal temperature, no tachycardia, negative chest x-ray for acute abnormalities. Patient is admitted to inpatient for further evaluation and treatment.  Review of Systems: As presented in the history of presenting  illness, rest negative.  Where does patient live?  At home Can patient participate in ADLs? Yes  Allergy: No Known Allergies  Past Medical History  Diagnosis Date  . Arthritis   . Glaucoma   . Hypertension   . Fracture 10/05/11    "fell and broke right hip"  . Fracture of femoral neck, right 10/12/2011  . Glaucoma     bilateral  . Polycythemia vera(238.4)   . Avascular necrosis of femur head, right 04/23/2012  . Thrombocytosis 04/23/2012    Past Surgical History  Procedure Laterality Date  . Hip pinning,cannulated  10/12/2011    Procedure: CANNULATED HIP PINNING;  Surgeon: Alejandra Bridge, MD;  Location: [place];  Service: Orthopedics;  Laterality: Right;  . Colonoscopy    . Hardware removal  04/23/2012    Procedure: HARDWARE REMOVAL;  Surgeon: Alejandra Bridge, MD;  Location: [place];  Service: Orthopedics;  Laterality: Right;  . Hip arthroplasty  04/23/2012    Procedure: ARTHROPLASTY BIPOLAR HIP;  Surgeon: Alejandra Bridge, MD;  Location: [place];  Service: Orthopedics;  Laterality: Right;    Social History:  reports that she quit smoking about 23 years ago. Her smoking use included Cigarettes. She smoked 0.12 packs per day. She has never used smokeless tobacco. She reports that she does not drink alcohol or use illicit drugs.  Family History: History reviewed. No pertinent family history.   Prior to Admission medications   Medication Sig Start Date End Date Taking? Authorizing Provider  acetaminophen (TYLENOL) 325 MG tablet Take 650 mg by mouth every 6 (six) hours as needed for moderate  pain.    Yes Historical Provider, MD  ALPRAZolam (XANAX) 0.25 MG tablet Take 0.25 mg by mouth 2 (two) times daily as needed for anxiety.    Yes Historical Provider, MD  amLODipine (NORVASC) 10 MG tablet Take 10 mg by mouth daily.   Yes Historical Provider, MD  aspirin EC 81 MG tablet Take 81 mg by mouth daily.   Yes Historical Provider, MD  benazepril (LOTENSIN) 40 MG tablet Take 40 mg by mouth daily.    Yes Historical Provider, MD  carvedilol (COREG) 3.125 MG tablet Take 3.125 mg by mouth 2 (two) times daily with a meal.  11/18/13  Yes Historical Provider, MD  diclofenac (VOLTAREN) 50 MG EC tablet Take 50 mg by mouth daily as needed for moderate pain.  01/27/14  Yes Historical Provider, MD  dorzolamide-timolol (COSOPT) 22.3-6.8 MG/ML ophthalmic solution Place 1 drop into both eyes 2 (two) times daily.   Yes Historical Provider, MD  HYDROcodone-acetaminophen [facility]) 10-325 MG per tablet Take one tablet every 6 hours as needed for pain 09/20/13  Yes Domenic Moras, PA-C  hydroxyurea (HYDREA) 500 MG capsule Take 1,000-1,500 mg by mouth daily. 1000 mg (2 pills) all days except 1500 mg (3 pills) on Mondays and Thursdays.   Yes Historical Provider, MD  loratadine (CLARITIN) 10 MG tablet Take 10 mg by mouth daily as needed for allergies.   Yes Historical Provider, MD  meclizine (ANTIVERT) 25 MG tablet Take 25 mg by mouth 3 (three) times daily as needed for dizziness.  03/25/14  Yes Historical Provider, MD  meloxicam (MOBIC) 15 MG tablet Take 15 mg by mouth daily.  12/16/13  Yes Historical Provider, MD    Physical Exam: Filed Vitals:   09/27/14 2315 09/27/14 2330 09/28/14 0000 09/28/14 0112  BP: 136/80 148/86 149/76 154/96  Pulse: 90 96 92 94  Temp:    98.9 F (37.2 C)  TempSrc:    Oral  Resp: 24 20 18 18   Height:    5\' 3"  (1.6 m)  Weight:    62.6 kg (138 lb 0.1 oz)  SpO2: 99% 99% 100% 100%   General: Not in acute distress, dry mucous and membrane HEENT:       Eyes: PERRL, EOMI, no scleral icterus       ENT: No discharge from the ears and nose, no pharynx injection, no tonsillar enlargement.        Neck: No JVD, no bruit, no mass felt. Cardiac: S1/S2, RRR, No murmurs, No gallops or rubs Pulm: Good air movement bilaterally. Clear to auscultation bilaterally. No rales, wheezing, rhonchi or rubs. Abd: Soft, nondistended, nontender, no rebound pain, no organomegaly, BS present Ext: No edema bilaterally.  2+DP/PT pulse bilaterally. At the flu vaccine injection site ( to the lateral side of left shoulder), there is small area with erythema, warmth and tenderness, proximately 3 x 3 in size. Musculoskeletal: No joint deformities, erythema, or stiffness, ROM full Skin: No rashes.  Neuro: Alert and oriented X3, cranial nerves II-XII grossly intact, muscle strength 4/5 in all extremeties, sensation to light touch intact. Brachial reflex 1+ bilaterally. Knee reflex 2+ bilaterally. Negative Babinski's sign. Normal finger to nose test. Psych: Patient is not psychotic, no suicidal or hemocidal ideation.  Labs on Admission:  Basic Metabolic Panel:  Recent Labs Lab 09/27/14 2222  NA 139  K 3.9  CL 109  CO2 24  GLUCOSE 125*  BUN 18  CREATININE 0.96  CALCIUM 9.1   Liver Function Tests:  Recent Labs Lab 09/27/14  2222  AST 42*  ALT 20  ALKPHOS 93  BILITOT 1.4*  PROT 8.4*  ALBUMIN 3.8   No results for input(s): LIPASE, AMYLASE in the last 168 hours. No results for input(s): AMMONIA in the last 168 hours. CBC:  Recent Labs Lab 09/27/14 2222  WBC 21.2*  NEUTROABS 18.6*  HGB 15.0  HCT 50.9*  MCV 67.4*  PLT 1419*   Cardiac Enzymes:  Recent Labs Lab 09/27/14 2222  CKTOTAL 511*  TROPONINI 0.03    BNP (last 3 results) No results for input(s): BNP in the last 8760 hours.  ProBNP (last 3 results) No results for input(s): PROBNP in the last 8760 hours.  CBG: No results for input(s): GLUCAP in the last 168 hours.  Radiological Exams on Admission: Dg Chest Port 1 View  09/27/2014   CLINICAL DATA:  Weakness. Redness and swelling about the left shoulder since a recent flu shot.  EXAM: PORTABLE CHEST - 1 VIEW  COMPARISON:  PA and lateral chest 10/12/2011.  FINDINGS: The lungs are clear. Heart size is mildly enlarged. No pneumothorax or pleural effusion. Marked degenerative change is seen about the shoulders.  IMPRESSION: No acute disease.   Electronically Signed   By: Inge Rise M.D.   On: 09/27/2014 22:45    EKG: Independently reviewed. Left axis deviation, occasional PAC, nonspecific T-wave change  Assessment/Plan Principal Problem:   Weakness generalized Active Problems:   Polycythemia vera   Avascular necrosis of femur head, right   Thrombocytosis   Cellulitis of shoulder   Vertigo   Essential hypertension  Generalized weakness: Etiology is not clear. flu vaccine injection site seems to be infected, as evidenced by local tenderness, redness and warmth. She seems to have cellulitis in the left shoulder. Though the local redness can be explained by possible compression since she was found to lean against the wall in her bathtub, the tenderness and warmth are consistent with cellulitis. In consistency with possible cellulitis, she has a worsening leukocytosis and thrombocytosis. Patient has chronic leukocytosis and thrombocytosis secondary to PV, but highest WBC level in the past was 12.6. Today her WBC increased to 21.2. Her platelets increased from previous 910 on 06/24/14 to 419. Another important differential diagnosis is Guillain-Barr syndrome after flu shot, which is unlikely given that she has intact deep tendon reflex, and that her weakness is generalized weakness ( typically starting from lower extremities for Guillain-Barr syndrome). She has vertigo which is an old issue, no new signs for stroke or TIA.  Another possibility needs to be considered is infarction of spinal cord given hx of PV, but she dose not have upper neuromotor signs. Currently patient is not obviously septic. Hemodynamically stable  -will admit to med-surg bed -Start IV clindamycin for cellulitis.  -follow up blood culture -get pro-calcitonin and trend lactic acid -IVF: 1 of NS bolus and followed by 125 cc/h -check TSH -may consider MRI-brain and spin or consult Neurology if getting worse.  HTN: -Continue home medications: Lotensin, Coreg, amlodipine  Arthritis: pain over  right hip. Has hx of avascular necrosis of right femur head. Patient has both mobic and voltaren on her medication list, which is not good for kidney. -Continue mobile, hold Voltaren -Switch Norco to oxycodone due to slightly abnormal liver function  Vertigo: Asymptomatic currently -Continue meclizine  PV: Patient has been followed up by Dr. Benay Spice, last seen was  on 03/15/14. She was also seen by nurse practitioner, Ned Card on 06/24/14. Her last phlebotomy was on 02/12/14. She  is currently taking hydroxyurea. -Continue hydroxyurea and ASA -Follow-up with Dr. Benay Spice   DVT ppx: SQ Heparin      Code Status: Partial (ok for CPR, but not intubation)  Family Communication: Yes, patient's sister and brother at bed side Disposition Plan: Admit to inpatient   Date of Service 09/28/2014    Ivor Costa [facility] Pager [phone]  If 7PM-7AM, please contact night-coverage [email or web address] Password [email or web address] 09/28/2014, 1:48 AM

## 2014-09-28 NOTE — Progress Notes (Signed)
Progress Note   Alejandra [name]:[id number] DOB: Feb 06, 1941 DOA: 09/27/2014 PCP: Clent Demark, MD   Brief Narrative:   Alejandra [name] is an 74 y.o. female with a PMH of hypertension, vertigo, glaucoma, arthritis, polycythemia vera who was admitted 09/27/14 with a chief complaint of weakness.  In ED, patient was found to have WBC increased from previous 10.0 to 21.2 and platelet increased from 910 on 06/24/14 to1419. Negative urinalysis, negative troponin, CK 511, electrolytes okay, normal temperature, no tachycardia, negative chest x-ray for acute abnormalities.   Assessment/Plan:   Principal Problem:   Weakness generalized / cellulitis of shoulder  Patient was admitted and hydrated.  Pro calcitonin and lactic acid not elevated. Urinalysis/chest x-ray not indicative of infection.  TSH WNL at 1.877.  Blood and urine cultures pending.  Being treated empirically with clindamycin for possible cellulitis of left shoulder at site of previous flu vaccination.  Physical therapy evaluation requested.  Active Problems:   Mild rhabdomyolysis  Hydrating.    Hypokalemia  Replete.    Polycythemia vera  Monitor blood counts.  Continue hydroxyurea and aspirin.    Avascular necrosis of femur head, right  Continue pain control efforts.    Thrombocytosis  Platelet count improved after being hydrated overnight.    Vertigo  Continue meclizine as needed.    Essential hypertension  Reasonably controlled on Lotensin, Coreg and amlodipine.    DVT Prophylaxis  Continue subcutaneous heparin.  Code Status: Full. Family Communication: No family at the bedside. Disposition Plan: Home (lives alone) when weakness fully evaluated and improved, assuming she does not need a SNF.  PT evaluation requested.  Will likely need home health services.   IV Access:    Peripheral IV   Procedures and diagnostic studies:   Dg Chest Port 1 View 09/27/2014: No acute disease.     Medical Consultants:    None.  Anti-Infectives:    Clindamycin 09/27/14--->  Subjective:   Alejandra [name] is having some soreness of the left shoulder where she received a flu vaccination.  She also reports some myalgias/arthralgias.  No SOB, occasional cough related to PND from sinus issues.  No nausea or vomiting.    Objective:    Filed Vitals:   09/27/14 2330 09/28/14 0000 09/28/14 0112 09/28/14 0642  BP: 148/86 149/76 154/96 136/93  Pulse: 96 92 94 89  Temp:   98.9 F (37.2 C) 98.8 F (37.1 C)  TempSrc:   Oral Oral  Resp: 20 18 18 18   Height:   5\' 3"  (1.6 m)   Weight:   62.6 kg (138 lb 0.1 oz)   SpO2: 99% 100% 100% 99%   No intake or output data in the 24 hours ending 09/28/14 0707  Exam: Gen:  NAD Skin: 3x3 cm area of erythema/mild induration left lateral shoulder area Cardiovascular:  RRR, No M/R/G Respiratory:  Lungs CTAB Gastrointestinal:  Abdomen soft, NT/ND, + BS Extremities:  No C/E/C   Data Reviewed:    Labs: Basic Metabolic Panel:  Recent Labs Lab 09/27/14 2222 09/28/14 0205  NA 139 140  K 3.9 3.1*  CL 109 107  CO2 24 24  GLUCOSE 125* 99  BUN 18 19  CREATININE 0.96 0.84  CALCIUM 9.1 9.0   GFR Estimated Creatinine Clearance: 48.6 mL/min (by C-G formula based on Cr of 0.84). Liver Function Tests:  Recent Labs Lab 09/27/14 2222 09/28/14 0205  AST 42* 34  ALT 20 20  ALKPHOS 93 89  BILITOT 1.4* 1.1  PROT 8.4* 7.9  ALBUMIN 3.8 3.6   Coagulation profile  Recent Labs Lab 09/28/14 0205  INR 1.41    CBC:  Recent Labs Lab 09/27/14 2222 09/28/14 0205  WBC 21.2* 17.4*  NEUTROABS 18.6*  --   HGB 15.0 14.3  HCT 50.9* 48.7*  MCV 67.4* 67.2*  PLT 1419* 1055*   Cardiac Enzymes:  Recent Labs Lab 09/27/14 2222 09/28/14 0205  CKTOTAL 511* 394*  TROPONINI 0.03  --    Thyroid function studies:  Recent Labs  09/28/14 0205  TSH 1.877   Sepsis Labs:  Recent Labs Lab 09/27/14 2222 09/28/14 0205  PROCALCITON  --   0.12  WBC 21.2* 17.4*  LATICACIDVEN  --  1.3   Microbiology No results found for this or any previous visit (from the past 240 hour(s)).   Medications:   . amLODipine  10 mg Oral Daily  . aspirin EC  81 mg Oral Daily  . benazepril  40 mg Oral Daily  . carvedilol  3.125 mg Oral BID WC  . clindamycin (CLEOCIN) IV  600 mg Intravenous Q8H  . dorzolamide-timolol  1 drop Both Eyes BID  . heparin  5,000 Units Subcutaneous 3 times per day  . [START ON 09/29/2014] hydroxyurea  1,000 mg Oral Once per day on Sun Tue Wed Fri Sat  . hydroxyurea  1,500 mg Oral Once per day on Mon Thu  . meloxicam  15 mg Oral Daily  . sodium chloride  500 mL Intravenous Once   Continuous Infusions:   Time spent: 30 minutes.   LOS: 0 days   Donovan Gatchel  [facility] Pager [phone]. If unable to reach me by pager, please call my cell phone at [phone].  *Please refer to [email or web address], password [email or web address] to get updated schedule on who will round on this patient, as hospitalists switch teams weekly. If 7PM-7AM, please contact night-coverage at [email or web address], password [email or web address] for any overnight needs.  09/28/2014, 7:07 AM

## 2014-09-28 NOTE — Progress Notes (Signed)
CARE MANAGEMENT NOTE 09/28/2014  Patient:  Alejandra [name], Alejandra [name]   Account Number:  [id number]  Date Initiated:  09/28/2014  Documentation initiated by:  Edwyna Shell  Subjective/Objective Assessment:   74 yo female admitted with weakness and cellulitis of left shoulder     Action/Plan:   discharge planning   Anticipated DC Date:  09/30/2014   Anticipated DC Plan:        [place]  CM consult      Choice offered to / List presented to:             Status of service:  In process, will continue to follow Medicare Important Message given?   (If response is "NO", the following Medicare IM given date fields will be blank) Date Medicare IM given:   Medicare IM given by:   Date Additional Medicare IM given:   Additional Medicare IM given by:    Discharge Disposition:    Per UR Regulation:    If discussed at Long Length of Stay Meetings, dates discussed:    Comments:  09/28/14 Edwyna Shell RN BSN CM [phone] Patient stated that she lives alone and has a walker and a cane. She states that her sister drives her to her MD appointments. The patient stated that she has been to [facility] in the past for rehab and is willing to go to rehab again if recommended by PT. Patient has also [facility] with [facility] and would like to use [facility] for any additional HH needs. Will follow with PT recommendations

## 2014-09-28 NOTE — Progress Notes (Signed)
Pt completed and passed the stroke swallow test done at the bedside , modify diet to heart healthy . Pt verbalized understanding will continue to monitor,

## 2014-09-28 NOTE — Evaluation (Signed)
Physical Therapy Evaluation Patient Details Name: Alejandra [name] MRN: [id number] DOB: 1941-04-22 Today's Date: 09/28/2014   History of Present Illness  74 y.o. female adm with weakness, inabillity to get out of bathtub for 2 days, recently had flu shot and developed cellulitis L shoulder;  past medical history of hypertension, vertigo, glaucoma, arthritis, polycythemia vera  Clinical Impression  Pt will benefit from PT to address deficits below; Pt expresses to PT that she does not want to go  Back to SNF if at all possible; she reports that her sister can check in on her/assist at home; will plan for HHPT, pt did better today as session progressed but she is still with balance deficits    Follow Up Recommendations Home health PT;Supervision - Intermittent    Equipment Recommendations  None recommended by PT    Recommendations for Other Services       Precautions / Restrictions Precautions Precautions: Fall      Mobility  Bed Mobility Overal bed mobility: Needs Assistance Bed Mobility: Supine to Sit     Supine to sit: Min assist     General bed mobility comments: pt requiring assist/facilitation  for Les and assist to bring trunk forward, cues for technique, incr time  Transfers Overall transfer level: Needs assistance Equipment used: Rolling walker (2 wheeled) Transfers: Sit to/from Stand Sit to Stand: Min assist         General transfer comment: min assist for wt shift and initial balance; pt with posterior lean, able to correct with tactile and verbal cues  Ambulation/Gait Ambulation/Gait assistance: Min guard;Supervision;Min assist Ambulation Distance (Feet): 140 Feet Assistive device: Rolling walker (2 wheeled)       General Gait Details: pt progressively requiring decreasing assist with incr distance; cues for RW safety  Stairs            Wheelchair Mobility    Modified Rankin (Stroke Patients Only)       Balance Overall balance assessment:  Needs assistance Sitting-balance support: Feet supported;Feet unsupported;No upper extremity supported Sitting balance-Leahy Scale: Fair       Standing balance-Leahy Scale: Poor Standing balance comment: pt requiring support of RW to maintain balance today; able to briefly maintain static stand without support when given adequate time, close supervision                             Pertinent Vitals/Pain Pain Assessment: 0-10 Pain Score: 3  Pain Location: hips/groin Pain Descriptors / Indicators: Sore Pain Intervention(s): Limited activity within patient's tolerance;Monitored during session;Repositioned    Home Living Family/patient expects to be discharged to:: Private residence Living Arrangements: Alone   Type of Home: House Home Access: Stairs to enter   [employer] of Steps: 2 Home Layout: One level Home Equipment: Environmental consultant - 2 wheels;Cane - single point Additional Comments: was recently getting HHPT/after rehab stay    Prior Function Level of Independence: Independent with assistive device(s)         Comments: normally uses cane when going out and  doesn't use AD inside the house     Hand Dominance        Extremity/Trunk Assessment   Upper Extremity Assessment: Defer to OT evaluation           Lower Extremity Assessment: Overall WFL for tasks assessed         Communication   Communication: No difficulties  Cognition Arousal/Alertness: Awake/alert Behavior During Therapy: [facility] for tasks assessed/performed  Overall Cognitive Status: Within Functional Limits for tasks assessed                      General Comments      Exercises        Assessment/Plan    PT Assessment Patient needs continued PT services  PT Diagnosis Difficulty walking   PT Problem List Decreased balance;Decreased mobility;Decreased activity tolerance  PT Treatment Interventions DME instruction;Gait training;Functional mobility  training;Therapeutic activities;Stair training;Patient/family education;Balance training;Therapeutic exercise   PT Goals (Current goals can be found in the Care Plan section) Acute Rehab PT Goals Patient Stated Goal: to go home PT Goal Formulation: With patient Time For Goal Achievement: 10/05/14 Potential to Achieve Goals: Good    Frequency Min 3X/week   Barriers to discharge        Co-evaluation               End of Session Equipment Utilized During Treatment: Gait belt Activity Tolerance: Patient tolerated treatment well Patient left: in chair;with call bell/phone within reach;with chair alarm set Nurse Communication: Mobility status         Time: 3710-6269 PT Time Calculation (min) (ACUTE ONLY): 27 min   Charges:   PT Evaluation $Initial PT Evaluation Tier I: 1 Procedure PT Treatments $Gait Training: 8-22 mins   PT G Codes:        Tikesha Mort 16-Oct-2014, 1:48 PM

## 2014-09-28 NOTE — ED Notes (Signed)
Report given to Pandora, RN  All questions answered by this nurse Dr. Blaine Hamper at bedside Will transport patient when MD out of room

## 2014-09-29 DIAGNOSIS — D45 Polycythemia vera: Secondary | ICD-10-CM

## 2014-09-29 DIAGNOSIS — I10 Essential (primary) hypertension: Secondary | ICD-10-CM

## 2014-09-29 DIAGNOSIS — L03119 Cellulitis of unspecified part of limb: Secondary | ICD-10-CM

## 2014-09-29 DIAGNOSIS — E876 Hypokalemia: Secondary | ICD-10-CM

## 2014-09-29 DIAGNOSIS — M6282 Rhabdomyolysis: Secondary | ICD-10-CM

## 2014-09-29 DIAGNOSIS — R42 Dizziness and giddiness: Secondary | ICD-10-CM

## 2014-09-29 DIAGNOSIS — D473 Essential (hemorrhagic) thrombocythemia: Secondary | ICD-10-CM

## 2014-09-29 LAB — BASIC METABOLIC PANEL
Anion gap: 7 (ref 5–15)
BUN: 32 mg/dL — ABNORMAL HIGH (ref 6–23)
CHLORIDE: 115 mmol/L — AB (ref 96–112)
CO2: 20 mmol/L (ref 19–32)
CREATININE: 0.88 mg/dL (ref 0.50–1.10)
Calcium: 8.8 mg/dL (ref 8.4–10.5)
GFR calc non Af Amer: 63 mL/min — ABNORMAL LOW (ref >90)
GFR, EST AFRICAN AMERICAN: 73 mL/min — AB (ref >90)
GLUCOSE: 92 mg/dL (ref 70–99)
Potassium: 4.5 mmol/L (ref 3.5–5.1)
Sodium: 142 mmol/L (ref 135–145)

## 2014-09-29 LAB — CBC
HCT: 42.5 % (ref 36.0–46.0)
Hemoglobin: 12.1 g/dL (ref 12.0–15.0)
MCH: 19 pg — AB (ref 26.0–34.0)
MCHC: 28.5 g/dL — AB (ref 30.0–36.0)
MCV: 66.6 fL — AB (ref 78.0–100.0)
PLATELETS: 1252 10*3/uL — AB (ref 150–400)
RBC: 6.38 MIL/uL — ABNORMAL HIGH (ref 3.87–5.11)
RDW: 22.7 % — ABNORMAL HIGH (ref 11.5–15.5)
WBC: 13.5 10*3/uL — ABNORMAL HIGH (ref 4.0–10.5)

## 2014-09-29 LAB — CK: Total CK: 199 U/L — ABNORMAL HIGH (ref 7–177)

## 2014-09-29 LAB — GLUCOSE, CAPILLARY: GLUCOSE-CAPILLARY: 94 mg/dL (ref 70–99)

## 2014-09-29 MED ORDER — CEPHALEXIN 500 MG PO CAPS: 500.0000 mg | ORAL_CAPSULE | Freq: Four times a day (QID) | ORAL | Status: DC

## 2014-09-29 NOTE — Discharge Summary (Signed)
Physician Discharge Summary  Alejandra [name] HCW:[id number] DOB: 05-Jun-1941 DOA: 09/27/2014  PCP: Clent Demark, MD  Admit date: 09/27/2014 Discharge date: 09/29/2014   Recommendations for Outpatient Follow-Up:   1. Recommend follow-up in 1-2 weeks to ensure resolution of cellulitis. Follow up final blood culture results. 2. Home physical therapy set up.   Discharge Diagnosis:   Principal Problem:    Weakness generalized, multi-factorial, with cellulitis of the shoulder contributory Active Problems:    Polycythemia vera    Avascular necrosis of femur head, right    Thrombocytosis    Cellulitis of shoulder    Vertigo    Essential hypertension    Rhabdomyolysis    Hypokalemia   Discharge Condition: Improved.  Diet recommendation: Low sodium, heart healthy.    History of Present Illness:   Alejandra [name] is an 74 y.o. female with a PMH of hypertension, vertigo, glaucoma, arthritis, polycythemia vera who was admitted 09/27/14 with a chief complaint of weakness. In ED, patient was found to have WBC increased from previous 10.0 to 21.2 and platelet increased from 910 on 06/24/14 to1419. Negative urinalysis, negative troponin, CK 511, electrolytes okay, normal temperature, no tachycardia, negative chest x-ray for acute abnormalities.   Hospital Course by Problem:   Principal Problem:  Weakness generalized / cellulitis of shoulder  Patient was admitted and hydrated.  Pro calcitonin and lactic acid not elevated. Urinalysis/chest x-ray not indicative of infection.  TSH WNL at 1.877.  Urine culture negative, blood cultures negative to date.  Being treated empirically with clindamycin for possible cellulitis of left shoulder at site of previous flu vaccination. We'll narrow antibiotics to Keflex and treat for 5 more days.  Physical therapy evaluation performed 09/28/14 with recommendations for home health PT, which has been set up.  Active Problems:  Mild  rhabdomyolysis  CK level improved with hydration.   Hypokalemia  Repleted.   Polycythemia vera  Blood counts stable.  Continue hydroxyurea and aspirin.   Avascular necrosis of femur head, right  Pain management provided.   Thrombocytosis  Platelet count improved after being hydrated overnight.   Vertigo  Continue meclizine as needed.   Essential hypertension  Reasonably controlled on Lotensin, Coreg and amlodipine.    Medical Consultants:    None.   Discharge Exam:   Filed Vitals:   09/29/14 0945  BP: 124/71  Pulse: 71  Temp: 98.1 F (36.7 C)  Resp: 20   Filed Vitals:   09/28/14 1958 09/29/14 0418 09/29/14 0621 09/29/14 0945  BP: 117/73 138/76 129/79 124/71  Pulse: 82 72 79 71  Temp: 98.7 F (37.1 C) 98.3 F (36.8 C) 98.4 F (36.9 C) 98.1 F (36.7 C)  TempSrc: Oral Oral Oral Oral  Resp: 18 18 18 20   Height:      Weight:      SpO2: 99% 100% 100% 100%    Gen: NAD Skin: 3x3 cm area of erythema/mild induration left lateral shoulder area Cardiovascular: RRR, No M/R/G Respiratory: Lungs CTAB Gastrointestinal: Abdomen soft, NT/ND, + BS Extremities: No C/E/C   The results of significant diagnostics from this hospitalization (including imaging, microbiology, ancillary and laboratory) are listed below for reference.     Procedures and Diagnostic Studies:   Dg Chest Port 1 View 09/27/2014: No acute disease.    Labs:   Basic Metabolic Panel:  Recent Labs Lab 09/27/14 2222 09/28/14 0205 09/29/14 0510  NA 139 140 142  K 3.9 3.1* 4.5  CL 109 107 115*  CO2 24 24 20  GLUCOSE 125* 99 92  BUN 18 19 32*  CREATININE 0.96 0.84 0.88  CALCIUM 9.1 9.0 8.8   GFR Estimated Creatinine Clearance: 46.4 mL/min (by C-G formula based on Cr of 0.88). Liver Function Tests:  Recent Labs Lab 09/27/14 2222 09/28/14 0205  AST 42* 34  ALT 20 20  ALKPHOS 93 89  BILITOT 1.4* 1.1  PROT 8.4* 7.9  ALBUMIN 3.8 3.6   Coagulation  profile  Recent Labs Lab 09/28/14 0205  INR 1.41    CBC:  Recent Labs Lab 09/27/14 2222 09/28/14 0205 09/29/14 0510  WBC 21.2* 17.4* 13.5*  NEUTROABS 18.6*  --   --   HGB 15.0 14.3 12.1  HCT 50.9* 48.7* 42.5  MCV 67.4* 67.2* 66.6*  PLT 1419* 1055* 1252*   Cardiac Enzymes:  Recent Labs Lab 09/27/14 2222 09/28/14 0205 09/29/14 0510  CKTOTAL 511* 394* 199*  TROPONINI 0.03  --   --    BNP: Invalid input(s): POCBNP CBG:  Recent Labs Lab 09/28/14 0726 09/29/14 0703  GLUCAP 85 94   Thyroid function studies  Recent Labs  09/28/14 0205  TSH 1.877   Microbiology Recent Results (from the past 240 hour(s))  Urine culture     Status: None   Collection Time: 09/27/14 10:59 PM  Result Value Ref Range Status   Specimen Description URINE, CLEAN CATCH  Final   Special Requests NONE  Final   Colony Count NO GROWTH Performed at [facility]   Final   Culture NO GROWTH Performed at [facility]   Final   Report Status 09/28/2014 FINAL  Final  Culture, blood (routine x 2)     Status: None (Preliminary result)   Collection Time: 09/28/14  2:10 AM  Result Value Ref Range Status   Specimen Description BLOOD LEFT HAND  Final   Special Requests BOTTLES DRAWN AEROBIC AND ANAEROBIC 5CC  Final   Culture   Final           BLOOD CULTURE RECEIVED NO GROWTH TO DATE CULTURE WILL BE HELD FOR 5 DAYS BEFORE ISSUING A FINAL NEGATIVE REPORT Performed at [facility]    Report Status PENDING  Incomplete  Culture, blood (routine x 2)     Status: None (Preliminary result)   Collection Time: 09/28/14  2:20 AM  Result Value Ref Range Status   Specimen Description BLOOD RIGHT ARM  Final   Special Requests BOTTLES DRAWN AEROBIC AND ANAEROBIC 10CC  Final   Culture   Final           BLOOD CULTURE RECEIVED NO GROWTH TO DATE CULTURE WILL BE HELD FOR 5 DAYS BEFORE ISSUING A FINAL NEGATIVE REPORT Performed at [facility]    Report Status PENDING   Incomplete     Discharge Instructions:   Discharge Instructions    Call MD for:  extreme fatigue    Complete by:  As directed      Call MD for:  persistant dizziness or light-headedness    Complete by:  As directed      Call MD for:  severe uncontrolled pain    Complete by:  As directed      Call MD for:  temperature >100.4    Complete by:  As directed      Diet - low sodium heart healthy    Complete by:  As directed      Discharge instructions    Complete by:  As directed   You have been prescribed  an antibiotic to treat the skin infection on your left shoulder.  Take this medication as prescribed for 5 more days, until all the pills are gone.  You were cared for by Dr. Jacquelynn Cree  (a hospitalist) during your hospital stay. If you have any questions about your discharge medications or the care you received while you were in the hospital after you are discharged, you can call the unit and ask to speak with the hospitalist on call if the hospitalist that took care of you is not available. Once you are discharged, your primary care physician will handle any further medical issues. Please note that NO REFILLS for any discharge medications will be authorized once you are discharged, as it is imperative that you return to your primary care physician (or establish a relationship with a primary care physician if you do not have one) for your aftercare needs so that they can reassess your need for medications and monitor your lab values.  Any outstanding tests can be reviewed by your PCP at your follow up visit.  It is also important to review any medicine changes with your PCP.  Please bring these d/c instructions with you to your next visit so your physician can review these changes with you.  If you do not have a primary care physician, you can call [phone] for a physician referral.  It is highly recommended that you obtain a PCP for hospital follow up.     Face-to-face encounter (required  for Medicare/Medicaid patients)    Complete by:  As directed   I Chinonso Linker certify that this patient is under my care and that I, or a nurse practitioner or physician's assistant working with me, had a face-to-face encounter that meets the physician face-to-face encounter requirements with this patient on 09/29/2014. The encounter with the patient was in whole, or in part for the following medical condition(s) which is the primary reason for home health care (List medical condition): Generalized weakness requiring hospital admission.  The encounter with the patient was in whole, or in part, for the following medical condition, which is the primary reason for home health care:  Weakness  I certify that, based on my findings, the following services are medically necessary home health services:  Physical therapy  Reason for Medically Necessary Home Health Services:  Therapy- Therapeutic Exercises to Increase Strength and Endurance  My clinical findings support the need for the above services:  Unable to leave home safely without assistance and/or assistive device  Further, I certify that my clinical findings support that this patient is homebound due to:  Unable to leave home safely without assistance     Home Health    Complete by:  As directed   To provide the following care/treatments:  PT     Increase activity slowly    Complete by:  As directed             Medication List    TAKE these medications        acetaminophen 325 MG tablet  Commonly known as:  TYLENOL  Take 650 mg by mouth every 6 (six) hours as needed for moderate pain.     ALPRAZolam 0.25 MG tablet  Commonly known as:  XANAX  Take 0.25 mg by mouth 2 (two) times daily as needed for anxiety.     amLODipine 10 MG tablet  Commonly known as:  NORVASC  Take 10 mg by mouth daily.     aspirin EC 81 MG tablet  Take 81 mg by mouth daily.     benazepril 40 MG tablet  Commonly known as:  LOTENSIN  Take 40 mg by mouth daily.      carvedilol 3.125 MG tablet  Commonly known as:  COREG  Take 3.125 mg by mouth 2 (two) times daily with a meal.     cephALEXin 500 MG capsule  Commonly known as:  KEFLEX  Take 1 capsule (500 mg total) by mouth 4 (four) times daily.     diclofenac 50 MG EC tablet  Commonly known as:  VOLTAREN  Take 50 mg by mouth daily as needed for moderate pain.     dorzolamide-timolol 22.3-6.8 MG/ML ophthalmic solution  Commonly known as:  COSOPT  Place 1 drop into both eyes 2 (two) times daily.     HYDROcodone-acetaminophen 10-325 MG per tablet  Commonly known as:  NORCO  Take one tablet every 6 hours as needed for pain     hydroxyurea 500 MG capsule  Commonly known as:  HYDREA  Take 1,000-1,500 mg by mouth daily. 1000 mg (2 pills) all days except 1500 mg (3 pills) on Mondays and Thursdays.     loratadine 10 MG tablet  Commonly known as:  CLARITIN  Take 10 mg by mouth daily as needed for allergies.     meclizine 25 MG tablet  Commonly known as:  ANTIVERT  Take 25 mg by mouth 3 (three) times daily as needed for dizziness.     meloxicam 15 MG tablet  Commonly known as:  MOBIC  Take 15 mg by mouth daily.           Follow-up Information    Follow up with Clent Demark, MD. Schedule an appointment as soon as possible for a visit in 1 week.   Specialty:  Cardiology   Why:  for hospital follow up if symptoms worsen.   Contact information:   [address] [place] [address] [phone]        Time coordinating discharge: 35 minutes.  Signed:  Erin Uecker  Pager [phone] [facility] 09/29/2014, 4:38 PM

## 2014-09-29 NOTE — Progress Notes (Signed)
Physical Therapy Treatment Patient Details Name: Alejandra [name] MRN: [id number] DOB: [birth year] Today's Date: Oct 01, 2014    History of Present Illness 74 y.o. female admitted with weakness, inabillity to get out of bathtub for 2 days, recently had flu shot and developed cellulitis L shoulder;  past medical history of hypertension, vertigo, glaucoma, arthritis, polycythemia vera    PT Comments    Pt able to tolerate improved ambulation distance today.  Pt using RW and agreeable to use upon d/c for safety.  Pt did not wish to practice a couple steps into home, feels comfortable performing them and states she will have assist upon d/c.     Follow Up Recommendations  Home health PT;Supervision - Intermittent     Equipment Recommendations  None recommended by PT    Recommendations for Other Services       Precautions / Restrictions Precautions Precautions: Fall    Mobility  Bed Mobility               General bed mobility comments: pt sitting EOB on arrival  Transfers Overall transfer level: Needs assistance Equipment used: Rolling walker (2 wheeled) Transfers: Sit to/from Stand Sit to Stand: Supervision         General transfer comment: no lean observed today, verbal cues for hand placement  Ambulation/Gait Ambulation/Gait assistance: Supervision Ambulation Distance (Feet): 400 Feet Assistive device: Rolling walker (2 wheeled) Gait Pattern/deviations: Step-through pattern;Decreased stride length     General Gait Details: occasional verbal cues for RW positioning, agreeable to use RW upon d/c home for safety   Stairs            Wheelchair Mobility    Modified Rankin (Stroke Patients Only)       Balance                                    Cognition Arousal/Alertness: Awake/alert Behavior During Therapy: WFL for tasks assessed/performed Overall Cognitive Status: Within Functional Limits for tasks assessed                       Exercises      General Comments        Pertinent Vitals/Pain Pain Assessment: No/denies pain    Home Living                      Prior Function            PT Goals (current goals can now be found in the care plan section) Progress towards PT goals: Progressing toward goals    Frequency  Min 3X/week    PT Plan Current plan remains appropriate    Co-evaluation             End of Session   Activity Tolerance: Patient tolerated treatment well Patient left: in chair;with call bell/phone within reach;with chair alarm set     Time: 1030-1043 PT Time Calculation (min) (ACUTE ONLY): 13 min  Charges:  $Gait Training: 8-22 mins                    G Codes:      Bren Steers,KATHrine E 10/01/2014, 12:05 PM Carmelia Bake, PT, DPT October 01, 2014 Pager: [phone]

## 2014-09-29 NOTE — Progress Notes (Signed)
CARE MANAGEMENT NOTE 09/29/2014  Patient:  Alejandra [name], Alejandra [name]   Account Number:  [id number]  Date Initiated:  09/28/2014  Documentation initiated by:  Edwyna Shell  Subjective/Objective Assessment:   74 yo female admitted with weakness and cellulitis of left shoulder     Action/Plan:   discharge planning   Anticipated DC Date:  09/29/2014   Anticipated DC Plan:  [place]  CM consult      Choice offered to / List presented to:  C-1 Patient        [place] arranged  [place] PT      [place].   Status of service:  Completed, signed off Medicare Important Message given?   (If response is "NO", the following Medicare IM given date fields will be blank) Date Medicare IM given:   Medicare IM given by:   Date Additional Medicare IM given:   Additional Medicare IM given by:    Discharge Disposition:  [place]  Per UR Regulation:    If discussed at Long Length of Stay Meetings, dates discussed:    Comments:  09/29/14 Edwyna Shell RN BSN CM 8 [place] PT referral communicated to [facility] liaison, Cyril Mourning  09/28/14 Edwyna Shell RN BSN CM [phone] Patient stated that she lives alone and has a walker and a cane. She states that her sister drives her to her MD appointments. The patient stated that she has been to [facility] in the past for rehab and is willing to go to rehab again if recommended by PT. Patient has also [place] services with [facility] and would like to use [facility] for any additional HH needs. Will follow with PT recommendations

## 2014-10-04 LAB — CULTURE, BLOOD (ROUTINE X 2)
CULTURE: NO GROWTH
CULTURE: NO GROWTH

## 2014-10-05 ENCOUNTER — Ambulatory Visit: Payer: Medicare HMO | Admitting: Nurse Practitioner

## 2014-10-05 ENCOUNTER — Other Ambulatory Visit: Payer: Medicare HMO

## 2014-10-07 ENCOUNTER — Telehealth: Payer: Self-pay | Admitting: Nurse Practitioner

## 2014-10-07 NOTE — Telephone Encounter (Signed)
Pt called states she fell in the bathroom last week and missed her apt, pt confirmed labs/ov ... KJ

## 2014-10-15 ENCOUNTER — Other Ambulatory Visit (HOSPITAL_BASED_OUTPATIENT_CLINIC_OR_DEPARTMENT_OTHER): Payer: Medicare HMO

## 2014-10-15 ENCOUNTER — Telehealth: Payer: Self-pay | Admitting: *Deleted

## 2014-10-15 ENCOUNTER — Telehealth: Payer: Self-pay | Admitting: Nurse Practitioner

## 2014-10-15 ENCOUNTER — Ambulatory Visit (HOSPITAL_BASED_OUTPATIENT_CLINIC_OR_DEPARTMENT_OTHER): Payer: Medicare HMO | Admitting: Nurse Practitioner

## 2014-10-15 VITALS — BP 158/97 | HR 78 | Temp 98.3°F | Resp 18 | Ht 63.0 in | Wt 143.1 lb

## 2014-10-15 DIAGNOSIS — I10 Essential (primary) hypertension: Secondary | ICD-10-CM

## 2014-10-15 DIAGNOSIS — D696 Thrombocytopenia, unspecified: Secondary | ICD-10-CM

## 2014-10-15 DIAGNOSIS — D45 Polycythemia vera: Secondary | ICD-10-CM

## 2014-10-15 DIAGNOSIS — E611 Iron deficiency: Secondary | ICD-10-CM

## 2014-10-15 LAB — CBC WITH DIFFERENTIAL/PLATELET
BASO%: 1.9 % (ref 0.0–2.0)
Basophils Absolute: 0.2 10*3/uL — ABNORMAL HIGH (ref 0.0–0.1)
EOS ABS: 0.7 10*3/uL — AB (ref 0.0–0.5)
EOS%: 5.5 % (ref 0.0–7.0)
HCT: 46.3 % (ref 34.8–46.6)
HEMOGLOBIN: 12.8 g/dL (ref 11.6–15.9)
LYMPH#: 1.1 10*3/uL (ref 0.9–3.3)
LYMPH%: 9 % — ABNORMAL LOW (ref 14.0–49.7)
MCH: 18.6 pg — AB (ref 25.1–34.0)
MCHC: 27.7 g/dL — ABNORMAL LOW (ref 31.5–36.0)
MCV: 66.9 fL — ABNORMAL LOW (ref 79.5–101.0)
MONO#: 0.6 10*3/uL (ref 0.1–0.9)
MONO%: 5 % (ref 0.0–14.0)
NEUT#: 9.6 10*3/uL — ABNORMAL HIGH (ref 1.5–6.5)
NEUT%: 78.6 % — AB (ref 38.4–76.8)
Platelets: 1361 10*3/uL — ABNORMAL HIGH (ref 145–400)
RBC: 6.92 10*6/uL — ABNORMAL HIGH (ref 3.70–5.45)
RDW: 24 % — AB (ref 11.2–14.5)
WBC: 12.3 10*3/uL — ABNORMAL HIGH (ref 3.9–10.3)

## 2014-10-15 NOTE — Progress Notes (Signed)
  [facility] OFFICE PROGRESS NOTE   Diagnosis:  Polycythemia vera  INTERVAL HISTORY:   Ms. Farner returns after missing a follow-up visit in February of this year. She continues Hydrea 1500 mg on Mondays and Thursdays and 1000 mg all other days. She denies nausea/vomiting. No rash. No shortness of breath or chest pain. No leg swelling or calf pain. She denies any bleeding. No symptoms of thrombosis.   She was hospitalized in February with left shoulder cellulitis.  Objective:  Vital signs in last 24 hours:  Blood pressure 158/97, pulse 78, temperature 98.3 F (36.8 C), temperature source Oral, resp. rate 18, height 5\' 3"  (1.6 m), weight 143 lb 1.6 oz (64.91 kg), SpO2 100 %.    HEENT: No thrush or ulcers. Resp: Lungs clear bilaterally. Cardio: Regular rate and rhythm. GI: Abdomen soft and nontender. No hepatomegaly. Vascular: No leg edema. Calves soft and nontender. Neuro: Alert and oriented.  Skin: No rash.    Lab Results:  Lab Results  Component Value Date   WBC 12.3* 10/15/2014   HGB 12.8 10/15/2014   HCT 46.3 10/15/2014   MCV 66.9* 10/15/2014   PLT 1,361* 10/15/2014   NEUTROABS 9.6* 10/15/2014    Imaging:  No results found.  Medications: I have reviewed the patient's current medications.  Assessment/Plan: 1. Polycythemia vera on hydroxyurea. Last phlebotomy 02/12/2014 2. Hypertension, followed by Dr. Montez Morita.  3. History of hyperpigmentation at the hands. Question related to hydroxyurea. 4. Hip fracture February 2013. She underwent a bipolar hip arthroplasty on 04/23/2012. 5. Thrombocytosis. Most likely secondary to polycythemia vera and iron deficiency. 6. Hospitalization with left shoulder cellulitis February 2016.   Disposition: Ms. Westhoff appears stable. The hematocrit is above the goal range of less than 43. She will increase the Hydrea dose to 1500 mg Mondays Wednesdays and Fridays, 1000 mg all other days. She will return for a repeat  CBC in 4 weeks. If the hematocrit remains above goal range we will proceed with a phlebotomy that day. She will return for a follow-up visit in 3 months.  Plan reviewed with Dr. Benay Spice.    Ned Card ANP/GNP-BC   10/15/2014  10:51 AM

## 2014-10-15 NOTE — Telephone Encounter (Signed)
Per staff message and POF I have scheduled appts. Advised scheduler of appts. JMW  

## 2014-10-15 NOTE — Telephone Encounter (Signed)
Gave avs & calendar for March thru May. Sent message to schedule phlebotomy.

## 2014-10-15 NOTE — Patient Instructions (Signed)
Increase Hydrea to 1500 mg on Monday/Wednesday/Friday; 1000 mg all other days

## 2014-11-05 ENCOUNTER — Encounter (HOSPITAL_COMMUNITY): Payer: Self-pay | Admitting: Emergency Medicine

## 2014-11-05 ENCOUNTER — Emergency Department (HOSPITAL_COMMUNITY)
Admission: EM | Admit: 2014-11-05 | Discharge: 2014-11-05 | Disposition: A | Discharge status: Home / Self Care | Payer: Medicare HMO | Source: Home / Self Care | Attending: Family Medicine | Admitting: Family Medicine

## 2014-11-05 ENCOUNTER — Emergency Department (INDEPENDENT_AMBULATORY_CARE_PROVIDER_SITE_OTHER): Payer: Medicare HMO

## 2014-11-05 DIAGNOSIS — G8929 Other chronic pain: Secondary | ICD-10-CM

## 2014-11-05 DIAGNOSIS — M25559 Pain in unspecified hip: Secondary | ICD-10-CM

## 2014-11-05 NOTE — ED Provider Notes (Signed)
Alejandra [name] is a 74 y.o. female who presents to Urgent Care today for hip pain. Patient has a history of hemiarthroplasty of the right hip. She suffered a fracture in February of 2013 with hip pinning which had revision to a bipolar arthroplasty in September 2013.  She notes continued pain and stiffness. She has been diagnosed with bursitis as well and has had two greater trochanter injections. She notes continued pain and would like some sort of treatment if able. She denies any radiating pain weakness or numbness. She notes the pain is in the groin and anterior thigh.   Past Medical History  Diagnosis Date  . Arthritis   . Glaucoma   . Hypertension   . Fracture 10/05/11    "fell and broke right hip"  . Fracture of femoral neck, right 10/12/2011  . Glaucoma     bilateral  . Polycythemia vera(238.4)   . Avascular necrosis of femur head, right 04/23/2012  . Thrombocytosis 04/23/2012  . Cellulitis of shoulder 09/28/2014  . Rhabdomyolysis 09/28/2014   Past Surgical History  Procedure Laterality Date  . Hip pinning,cannulated  10/12/2011    Procedure: CANNULATED HIP PINNING;  Surgeon: Johnny Bridge, MD;  Location: [facility];  Service: Orthopedics;  Laterality: Right;  . Colonoscopy    . Hardware removal  04/23/2012    Procedure: HARDWARE REMOVAL;  Surgeon: Johnny Bridge, MD;  Location: [facility];  Service: Orthopedics;  Laterality: Right;  . Hip arthroplasty  04/23/2012    Procedure: ARTHROPLASTY BIPOLAR HIP;  Surgeon: Johnny Bridge, MD;  Location: [facility];  Service: Orthopedics;  Laterality: Right;   History  Substance Use Topics  . Smoking status: Former Smoker -- 0.12 packs/day    Types: Cigarettes    Quit date: 08/15/1991  . Smokeless tobacco: Never Used  . Alcohol Use: No     Comment: 10/12/11 "did drink in my younger; don't drink at all now"   ROS as above Medications: No current facility-administered medications for this encounter.   Current Outpatient Prescriptions  Medication Sig  Dispense Refill  . acetaminophen (TYLENOL) 325 MG tablet Take 650 mg by mouth every 6 (six) hours as needed for moderate pain.     Marland Kitchen amLODipine (NORVASC) 10 MG tablet Take 10 mg by mouth daily.    Marland Kitchen aspirin EC 81 MG tablet Take 81 mg by mouth daily.    . benazepril (LOTENSIN) 40 MG tablet Take 40 mg by mouth daily.    . carvedilol (COREG) 3.125 MG tablet Take 3.125 mg by mouth 2 (two) times daily with a meal.     . ALPRAZolam (XANAX) 0.25 MG tablet Take 0.25 mg by mouth 2 (two) times daily as needed for anxiety.     . diclofenac (VOLTAREN) 50 MG EC tablet Take 50 mg by mouth daily as needed for moderate pain.     . dorzolamide-timolol (COSOPT) 22.3-6.8 MG/ML ophthalmic solution Place 1 drop into both eyes 2 (two) times daily.    Marland Kitchen HYDROcodone-acetaminophen (NORCO) 10-325 MG per tablet Take one tablet every 6 hours as needed for pain (Patient not taking: Reported on 10/15/2014) 20 tablet 0  . hydroxyurea (HYDREA) 500 MG capsule Take 1,000-1,500 mg by mouth daily. 1000 mg (2 pills) all days except 1500 mg (3 pills) on Mondays and Thursdays.    Marland Kitchen loratadine (CLARITIN) 10 MG tablet Take 10 mg by mouth daily as needed for allergies.    Marland Kitchen meclizine (ANTIVERT) 25 MG tablet Take 25 mg by mouth  3 (three) times daily as needed for dizziness.   1  . meloxicam (MOBIC) 15 MG tablet Take 15 mg by mouth daily.      No Known Allergies   Exam:  BP 192/116 mmHg  Pulse 76  Temp(Src) 97.2 F (36.2 C) (Oral)  Resp 18  SpO2 96% Gen: Well NAD HEENT: EOMI,  MMM Lungs: Normal work of breathing. CTABL Heart: RRR no MRG Abd: NABS, Soft. Nondistended, Nontender Exts: Brisk capillary refill, warm and well perfused.  Right hip: Normal-appearing nontender. Scars are well appearing. Hip range of motion is intact without significant pain.  No results found for this or any previous visit (from the past 24 hour(s)). Dg Hip Unilat With Pelvis 2-3 Views Right  11/05/2014   CLINICAL DATA:  Previous right hip replacement  with hip pain, initial encounter  EXAM: RIGHT HIP (WITH PELVIS) 2-3 VIEWS  COMPARISON:  09/20/2013  FINDINGS: Right hip replacement is again identified. The pelvic ring is intact. No loosening or acute abnormality is seen. No gross soft tissue abnormality is noted.  IMPRESSION: Postsurgical changes.  No acute abnormality is noted.   Electronically Signed   By: Inez Catalina M.D.   On: 11/05/2014 15:30    Assessment and Plan: 74 y.o. female with chronic hip discomfort following total hip arthroplasty. Unclear etiology. I think this is likely within the realm of normal after multiple surgeries. Plan to get second opinion with Dr. Mayer Camel at [place] [facility].  F/u with PCP for BP.   Discussed warning signs or symptoms. Please see discharge instructions. Patient expresses understanding.     Gregor Hams, MD 11/05/14 [phone]

## 2014-11-05 NOTE — Discharge Instructions (Signed)
Thank you for coming in today. Follow-up with Dr. Mayer Camel for a second opinion about your hip pain Take tramadol or Tylenol for pain control Return as needed  Total Hip Replacement Total hip replacement is the replacement of your damaged hip with an artificial hip joint (prosthetic hip joint). The purpose of this surgery is to reduce pain and improve your hip function. LET [facility] CARE PROVIDER KNOW ABOUT:   Any allergies you have.  All medicines you are taking, including vitamins, herbs, eye drops, creams, and over-the-counter medicines.  Previous problems you or members of your family have had with the use of anesthetics.  Any blood disorders you have.  Previous surgeries you have had.  Medical conditions you have. RISKS AND COMPLICATIONS Generally, total hip replacement is a safe procedure. However, problems can occur and include:  Infection.  Dislocation (the ball of the hip-joint prosthesis comes out of contact with the socket).  Loosening of the stem connected to the ball or socket.  Fracture of the bone while inserting the prosthesis.  Formation of blood clots, which can break loose and travel to and injure your lungs (pulmonary embolus). BEFORE THE PROCEDURE   Do not eat or drink anything after midnight on the night before the procedure or as directed by your health care provider.  Ask your health care provider about changing or stopping your regular medicines. This is especially important if you are taking diabetes medicines or blood thinners. PROCEDURE  Just before the procedure, you will receive medicine that makes you drowsy (sedative) or a medicine that makes you fall asleep (general anesthetic). This will be given through a tube that is inserted into one of your veins (IV tube).  You will then receive a medicine injected into your spine that numbs your body below the waist (spinal anesthetic).  An incision is made in your hip. Your surgeon will take out any  damaged cartilage and bone.  Next, your surgeon will insert a prosthetic socket into your pelvic bone. This is usually secured with screws.  Your surgeon will then cut off the ball of your thigh bone (femur) and attach a prosthetic ball on a stem to your femur.  The surgeon will place the ball into the socket and check the range of motion of your new hip. AFTER THE PROCEDURE   You will be taken to a recovery area where a nurse will watch and check your progress.  Once you are awake and stable, you will be taken to a hospital room.  You will receive physical therapy until you are doing well and your health care provider feels it is safe for you to go home. Document Released: 11/06/2000 Document Revised: 12/15/2013 Document Reviewed: 10/01/2013 [facility] Patient Information 2015 [place], [place]. This information is not intended to replace advice given to you by your health care provider. Make sure you discuss any questions you have with your health care provider.

## 2014-11-05 NOTE — ED Notes (Signed)
C/o right hip pain x 1 wk and is gradually getting worse.  Pt has a hx of hip surgery x 2.  Partial hip replacement.  Hx of arthritis and bursitis.  Mild relief with tylenol.  Denies injury.

## 2014-11-12 ENCOUNTER — Other Ambulatory Visit: Payer: Self-pay | Admitting: *Deleted

## 2014-11-12 ENCOUNTER — Other Ambulatory Visit (HOSPITAL_BASED_OUTPATIENT_CLINIC_OR_DEPARTMENT_OTHER): Payer: Medicare HMO

## 2014-11-12 ENCOUNTER — Ambulatory Visit: Payer: Medicare HMO

## 2014-11-12 DIAGNOSIS — D45 Polycythemia vera: Secondary | ICD-10-CM

## 2014-11-12 LAB — CBC WITH DIFFERENTIAL/PLATELET
BASO%: 1.9 % (ref 0.0–2.0)
BASOS ABS: 0.3 10*3/uL — AB (ref 0.0–0.1)
EOS%: 3.9 % (ref 0.0–7.0)
Eosinophils Absolute: 0.5 10*3/uL (ref 0.0–0.5)
HEMATOCRIT: 46.7 % — AB (ref 34.8–46.6)
HGB: 13.6 g/dL (ref 11.6–15.9)
LYMPH#: 1.3 10*3/uL (ref 0.9–3.3)
LYMPH%: 9.9 % — ABNORMAL LOW (ref 14.0–49.7)
MCH: 19.6 pg — ABNORMAL LOW (ref 25.1–34.0)
MCHC: 29.1 g/dL — ABNORMAL LOW (ref 31.5–36.0)
MCV: 67.3 fL — ABNORMAL LOW (ref 79.5–101.0)
MONO#: 0.7 10*3/uL (ref 0.1–0.9)
MONO%: 5.5 % (ref 0.0–14.0)
NEUT#: 10.5 10*3/uL — ABNORMAL HIGH (ref 1.5–6.5)
NEUT%: 78.8 % — AB (ref 38.4–76.8)
Platelets: 1354 10*3/uL — ABNORMAL HIGH (ref 145–400)
RBC: 6.94 10*6/uL — ABNORMAL HIGH (ref 3.70–5.45)
RDW: 26.8 % — ABNORMAL HIGH (ref 11.2–14.5)
WBC: 13.3 10*3/uL — AB (ref 3.9–10.3)

## 2014-11-12 NOTE — Patient Instructions (Signed)

## 2014-12-10 ENCOUNTER — Other Ambulatory Visit (HOSPITAL_BASED_OUTPATIENT_CLINIC_OR_DEPARTMENT_OTHER): Payer: Medicare HMO

## 2014-12-10 DIAGNOSIS — D45 Polycythemia vera: Secondary | ICD-10-CM | POA: Diagnosis not present

## 2014-12-10 LAB — CBC WITH DIFFERENTIAL/PLATELET
BASO%: 3 % — ABNORMAL HIGH (ref 0.0–2.0)
BASOS ABS: 0.5 10*3/uL — AB (ref 0.0–0.1)
EOS%: 4.2 % (ref 0.0–7.0)
Eosinophils Absolute: 0.7 10*3/uL — ABNORMAL HIGH (ref 0.0–0.5)
HEMATOCRIT: 41.8 % (ref 34.8–46.6)
HGB: 12.2 g/dL (ref 11.6–15.9)
LYMPH%: 8.7 % — ABNORMAL LOW (ref 14.0–49.7)
MCH: 19.3 pg — AB (ref 25.1–34.0)
MCHC: 29.2 g/dL — ABNORMAL LOW (ref 31.5–36.0)
MCV: 66.2 fL — AB (ref 79.5–101.0)
MONO#: 0.6 10*3/uL (ref 0.1–0.9)
MONO%: 3.8 % (ref 0.0–14.0)
NEUT%: 80.3 % — AB (ref 38.4–76.8)
NEUTROS ABS: 13.7 10*3/uL — AB (ref 1.5–6.5)
RBC: 6.32 10*6/uL — ABNORMAL HIGH (ref 3.70–5.45)
RDW: 25 % — ABNORMAL HIGH (ref 11.2–14.5)
WBC: 17 10*3/uL — ABNORMAL HIGH (ref 3.9–10.3)
lymph#: 1.5 10*3/uL (ref 0.9–3.3)

## 2014-12-11 ENCOUNTER — Telehealth: Payer: Self-pay | Admitting: *Deleted

## 2014-12-11 NOTE — Telephone Encounter (Signed)
-----   Message from Ladell Pier, MD sent at 12/11/2014  3:25 PM EDT ----- Please call patient, increase hydrea to 1500mg  on M,W,F  1000mg  other days, f/u as scheduled

## 2014-12-11 NOTE — Telephone Encounter (Signed)
Called and informed patient to increase hydrea to 1500mg  on MWF. 1000mg  all other days.  F/u as scheduled.   Per Dr. Benay Spice.  Patient verbalized understanding.

## 2015-01-07 ENCOUNTER — Telehealth: Payer: Self-pay | Admitting: Oncology

## 2015-01-07 ENCOUNTER — Ambulatory Visit (HOSPITAL_BASED_OUTPATIENT_CLINIC_OR_DEPARTMENT_OTHER): Payer: Medicare HMO | Admitting: Oncology

## 2015-01-07 ENCOUNTER — Other Ambulatory Visit (HOSPITAL_BASED_OUTPATIENT_CLINIC_OR_DEPARTMENT_OTHER): Payer: Medicare HMO

## 2015-01-07 VITALS — BP 166/88 | HR 82 | Temp 97.8°F | Resp 18 | Ht 63.0 in | Wt 140.3 lb

## 2015-01-07 DIAGNOSIS — D45 Polycythemia vera: Secondary | ICD-10-CM

## 2015-01-07 LAB — CBC WITH DIFFERENTIAL/PLATELET
BASO%: 2.6 % — ABNORMAL HIGH (ref 0.0–2.0)
Basophils Absolute: 0.3 10*3/uL — ABNORMAL HIGH (ref 0.0–0.1)
EOS%: 4.9 % (ref 0.0–7.0)
Eosinophils Absolute: 0.7 10*3/uL — ABNORMAL HIGH (ref 0.0–0.5)
HEMATOCRIT: 43.1 % (ref 34.8–46.6)
HGB: 12.6 g/dL (ref 11.6–15.9)
LYMPH%: 10.4 % — AB (ref 14.0–49.7)
MCH: 19.2 pg — ABNORMAL LOW (ref 25.1–34.0)
MCHC: 29.2 g/dL — ABNORMAL LOW (ref 31.5–36.0)
MCV: 65.9 fL — AB (ref 79.5–101.0)
MONO#: 0.6 10*3/uL (ref 0.1–0.9)
MONO%: 4.3 % (ref 0.0–14.0)
NEUT#: 10.5 10*3/uL — ABNORMAL HIGH (ref 1.5–6.5)
NEUT%: 77.8 % — ABNORMAL HIGH (ref 38.4–76.8)
RBC: 6.53 10*6/uL — ABNORMAL HIGH (ref 3.70–5.45)
RDW: 23.8 % — AB (ref 11.2–14.5)
WBC: 13.5 10*3/uL — AB (ref 3.9–10.3)
lymph#: 1.4 10*3/uL (ref 0.9–3.3)

## 2015-01-07 NOTE — Telephone Encounter (Signed)
per pof to sch pt appt-gave pt copy of sch °

## 2015-01-07 NOTE — Progress Notes (Signed)
  [facility] OFFICE PROGRESS NOTE   Diagnosis: Polycythemia vera  INTERVAL HISTORY:   She returns as scheduled. She continues hydroxyurea. No symptom of thrombosis. No bleeding. She underwent phlebotomy 11/12/2014. She tolerated the phlebotomy well.  Objective:  Vital signs in last 24 hours:  Blood pressure 166/88, pulse 82, temperature 97.8 F (36.6 C), temperature source Oral, resp. rate 18, height 5\' 3"  (1.6 m), weight 140 lb 4.8 oz (63.64 kg), SpO2 100 %.    HEENT: No thrush or ulcers Resp: Distant breath sounds, clear bilaterally, no respiratory distress Cardio: Regular rate and rhythm GI: No hepatosplenomegaly Vascular: No leg edema   Lab Results:  Lab Results  Component Value Date   WBC 13.5* 01/07/2015   HGB 12.6 01/07/2015   HCT 43.1 01/07/2015   MCV 65.9* 01/07/2015   PLT 1,091* 01/07/2015   NEUTROABS 10.5* 01/07/2015    Medications: I have reviewed the patient's current medications.  Assessment/Plan: 1. Polycythemia vera on hydroxyurea. Last phlebotomy 11/12/2014 2. Hypertension, followed by Dr. Montez Morita.  3. History of hyperpigmentation at the hands. Question related to hydroxyurea. 4. Hip fracture February 2013. She underwent a bipolar hip arthroplasty on 04/23/2012. 5. Thrombocytosis. Most likely secondary to polycythemia vera and iron deficiency. 6. Hospitalization with left shoulder cellulitis February 2016.     Disposition:  The hemoglobin and hematocrit are in goal range today. She will continue hydroxyurea at the current dose. Ms. Hertzberg will return for a lab visit in 2 months and an office visit in 4 months.  Betsy Coder, MD  01/07/2015  12:21 PM

## 2015-02-02 ENCOUNTER — Other Ambulatory Visit (HOSPITAL_COMMUNITY): Payer: Self-pay | Admitting: Orthopedic Surgery

## 2015-02-02 DIAGNOSIS — T84498A Other mechanical complication of other internal orthopedic devices, implants and grafts, initial encounter: Secondary | ICD-10-CM

## 2015-02-10 ENCOUNTER — Encounter (HOSPITAL_COMMUNITY)
Admission: RE | Admit: 2015-02-10 | Discharge: 2015-02-10 | Disposition: A | Discharge status: Home / Self Care | Payer: Medicare HMO | Source: Ambulatory Visit | Attending: Orthopedic Surgery | Admitting: Orthopedic Surgery

## 2015-02-10 DIAGNOSIS — T84498A Other mechanical complication of other internal orthopedic devices, implants and grafts, initial encounter: Secondary | ICD-10-CM

## 2015-02-10 DIAGNOSIS — X58XXXA Exposure to other specified factors, initial encounter: Secondary | ICD-10-CM | POA: Insufficient documentation

## 2015-02-10 MED ORDER — TECHNETIUM TC 99M MEDRONATE IV KIT
25.2000 | PACK | Freq: Once | INTRAVENOUS | Status: AC | PRN
  Administered 2015-02-10: 25.2 via INTRAVENOUS

## 2015-05-06 ENCOUNTER — Other Ambulatory Visit: Payer: Self-pay | Admitting: Pulmonary Disease

## 2015-05-06 DIAGNOSIS — Z1231 Encounter for screening mammogram for malignant neoplasm of breast: Secondary | ICD-10-CM

## 2015-05-12 ENCOUNTER — Ambulatory Visit
Admission: RE | Admit: 2015-05-12 | Discharge: 2015-05-12 | Disposition: A | Discharge status: Home / Self Care | Payer: Medicare HMO | Source: Ambulatory Visit | Attending: Pulmonary Disease | Admitting: Pulmonary Disease

## 2015-05-12 DIAGNOSIS — Z1231 Encounter for screening mammogram for malignant neoplasm of breast: Secondary | ICD-10-CM

## 2015-05-13 ENCOUNTER — Ambulatory Visit (HOSPITAL_BASED_OUTPATIENT_CLINIC_OR_DEPARTMENT_OTHER): Payer: Medicare HMO | Admitting: Nurse Practitioner

## 2015-05-13 ENCOUNTER — Telehealth: Payer: Self-pay | Admitting: Oncology

## 2015-05-13 ENCOUNTER — Other Ambulatory Visit (HOSPITAL_BASED_OUTPATIENT_CLINIC_OR_DEPARTMENT_OTHER): Payer: Medicare HMO

## 2015-05-13 VITALS — BP 164/83 | HR 72 | Temp 98.8°F | Resp 18 | Ht 63.0 in | Wt 133.5 lb

## 2015-05-13 DIAGNOSIS — D45 Polycythemia vera: Secondary | ICD-10-CM

## 2015-05-13 LAB — CBC WITH DIFFERENTIAL/PLATELET
BASO%: 2.1 % — ABNORMAL HIGH (ref 0.0–2.0)
BASOS ABS: 0.4 10*3/uL — AB (ref 0.0–0.1)
EOS%: 4.7 % (ref 0.0–7.0)
Eosinophils Absolute: 0.9 10*3/uL — ABNORMAL HIGH (ref 0.0–0.5)
HEMATOCRIT: 46.1 % (ref 34.8–46.6)
HGB: 13.2 g/dL (ref 11.6–15.9)
LYMPH#: 1.5 10*3/uL (ref 0.9–3.3)
LYMPH%: 8.1 % — ABNORMAL LOW (ref 14.0–49.7)
MCH: 18.7 pg — AB (ref 25.1–34.0)
MCHC: 28.6 g/dL — ABNORMAL LOW (ref 31.5–36.0)
MCV: 65.3 fL — ABNORMAL LOW (ref 79.5–101.0)
MONO#: 1.1 10*3/uL — AB (ref 0.1–0.9)
MONO%: 5.7 % (ref 0.0–14.0)
NEUT#: 15 10*3/uL — ABNORMAL HIGH (ref 1.5–6.5)
NEUT%: 79.4 % — ABNORMAL HIGH (ref 38.4–76.8)
Platelets: 2071 10*3/uL — ABNORMAL HIGH (ref 145–400)
RBC: 7.06 10*6/uL — ABNORMAL HIGH (ref 3.70–5.45)
RDW: 25.2 % — ABNORMAL HIGH (ref 11.2–14.5)
WBC: 18.9 10*3/uL — ABNORMAL HIGH (ref 3.9–10.3)

## 2015-05-13 NOTE — Telephone Encounter (Signed)
Gave and printed appt sched and avs fo rpt for OCT °

## 2015-05-13 NOTE — Progress Notes (Signed)
  [place] OFFICE PROGRESS NOTE   Diagnosis: Polycythemia vera   INTERVAL HISTORY:   Alejandra [name] returns as scheduled. She continues hydroxyurea 1500 mg on Mondays Wednesdays and Fridays and 1000 mg all other days. She has no symptoms of thrombosis. She denies any bleeding. No nausea or vomiting. No diarrhea or constipation. Appetite is stable. No unusual headaches. Her main complaint today is "arthritis pain".  Objective:  Vital signs in last 24 hours:  Blood pressure 164/83, pulse 72, temperature 98.8 F (37.1 C), temperature source Oral, resp. rate 18, height 5\' 3"  (1.6 m), weight 133 lb 8 oz (60.555 kg), SpO2 100 %.    HEENT: No thrush or ulcers. Resp: Lungs clear bilaterally. Cardio: Regular rate and rhythm. GI: Abdomen soft and nontender. No organomegaly. Vascular: No leg edema.    Lab Results:  Lab Results  Component Value Date   WBC 18.9* 05/13/2015   HGB 13.2 05/13/2015   HCT 46.1 05/13/2015   MCV 65.3* 05/13/2015   PLT 2,071* 05/13/2015   NEUTROABS 15.0* 05/13/2015    Imaging:  No results found.  Medications: I have reviewed the patient's current medications.  Assessment/Plan: 1. Polycythemia vera on hydroxyurea. Last phlebotomy 11/12/2014 2. Hypertension, followed by Dr. Montez Morita.  3. History of hyperpigmentation at the hands. Question related to hydroxyurea. 4. Hip fracture February 2013. She underwent a bipolar hip arthroplasty on 04/23/2012. 5. Thrombocytosis. Most likely secondary to polycythemia vera and iron deficiency. 6. Hospitalization with left shoulder cellulitis February 2016.   Disposition: The hemoglobin/hematocrit is above goal range. The platelet count is also above goal range. She could not stay for a phlebotomy today. She will return for a phlebotomy in one week and 3 weeks. She will increase the hydroxyurea to 1500 mg daily. We will obtain CBCs with each phlebotomy. We will see her in follow-up prior to the phlebotomy  in 3 weeks. She will contact the office in the interim with any problems.  Plan reviewed with Dr. Benay Spice.    Ned Card ANP/GNP-BC   05/13/2015  11:57 AM

## 2015-05-20 ENCOUNTER — Other Ambulatory Visit: Payer: Medicare HMO

## 2015-05-20 ENCOUNTER — Ambulatory Visit (HOSPITAL_BASED_OUTPATIENT_CLINIC_OR_DEPARTMENT_OTHER): Payer: Medicare HMO

## 2015-05-20 ENCOUNTER — Other Ambulatory Visit (HOSPITAL_BASED_OUTPATIENT_CLINIC_OR_DEPARTMENT_OTHER): Payer: Medicare HMO

## 2015-05-20 DIAGNOSIS — D45 Polycythemia vera: Secondary | ICD-10-CM

## 2015-05-20 LAB — CBC WITH DIFFERENTIAL/PLATELET
BASO%: 0.6 % (ref 0.0–2.0)
BASOS ABS: 0.1 10*3/uL (ref 0.0–0.1)
EOS ABS: 0.7 10*3/uL — AB (ref 0.0–0.5)
EOS%: 5.2 % (ref 0.0–7.0)
HEMATOCRIT: 44.9 % (ref 34.8–46.6)
HEMOGLOBIN: 13.2 g/dL (ref 11.6–15.9)
LYMPH#: 1 10*3/uL (ref 0.9–3.3)
LYMPH%: 7 % — ABNORMAL LOW (ref 14.0–49.7)
MCH: 18.5 pg — AB (ref 25.1–34.0)
MCHC: 29.5 g/dL — ABNORMAL LOW (ref 31.5–36.0)
MCV: 62.7 fL — AB (ref 79.5–101.0)
MONO#: 0.4 10*3/uL (ref 0.1–0.9)
MONO%: 3.1 % (ref 0.0–14.0)
NEUT#: 11.7 10*3/uL — ABNORMAL HIGH (ref 1.5–6.5)
NEUT%: 84.1 % — ABNORMAL HIGH (ref 38.4–76.8)
Platelets: 1672 10*3/uL — ABNORMAL HIGH (ref 145–400)
RBC: 7.17 10*6/uL — ABNORMAL HIGH (ref 3.70–5.45)
RDW: 25.7 % — AB (ref 11.2–14.5)
WBC: 13.9 10*3/uL — ABNORMAL HIGH (ref 3.9–10.3)

## 2015-05-20 NOTE — Patient Instructions (Signed)

## 2015-05-20 NOTE — Progress Notes (Signed)
Pt tolerated phlebotomy without any issues. VSS remained stable. Took out 500cc of blood. Observed x54min post phlebotomy and provided snacks.

## 2015-05-31 ENCOUNTER — Emergency Department (HOSPITAL_COMMUNITY)
Admission: EM | Admit: 2015-05-31 | Discharge: 2015-05-31 | Disposition: A | Discharge status: Home / Self Care | Payer: Medicare HMO | Attending: Emergency Medicine | Admitting: Emergency Medicine

## 2015-05-31 ENCOUNTER — Emergency Department (HOSPITAL_COMMUNITY): Payer: Medicare HMO

## 2015-05-31 ENCOUNTER — Encounter (HOSPITAL_COMMUNITY): Payer: Self-pay | Admitting: Family Medicine

## 2015-05-31 DIAGNOSIS — I10 Essential (primary) hypertension: Secondary | ICD-10-CM | POA: Diagnosis not present

## 2015-05-31 DIAGNOSIS — Y998 Other external cause status: Secondary | ICD-10-CM | POA: Diagnosis not present

## 2015-05-31 DIAGNOSIS — G8929 Other chronic pain: Secondary | ICD-10-CM | POA: Insufficient documentation

## 2015-05-31 DIAGNOSIS — Z79899 Other long term (current) drug therapy: Secondary | ICD-10-CM | POA: Insufficient documentation

## 2015-05-31 DIAGNOSIS — Z7982 Long term (current) use of aspirin: Secondary | ICD-10-CM | POA: Insufficient documentation

## 2015-05-31 DIAGNOSIS — Z862 Personal history of diseases of the blood and blood-forming organs and certain disorders involving the immune mechanism: Secondary | ICD-10-CM | POA: Diagnosis not present

## 2015-05-31 DIAGNOSIS — Z872 Personal history of diseases of the skin and subcutaneous tissue: Secondary | ICD-10-CM | POA: Insufficient documentation

## 2015-05-31 DIAGNOSIS — M1990 Unspecified osteoarthritis, unspecified site: Secondary | ICD-10-CM | POA: Diagnosis not present

## 2015-05-31 DIAGNOSIS — Y9289 Other specified places as the place of occurrence of the external cause: Secondary | ICD-10-CM | POA: Insufficient documentation

## 2015-05-31 DIAGNOSIS — Y9389 Activity, other specified: Secondary | ICD-10-CM | POA: Diagnosis not present

## 2015-05-31 DIAGNOSIS — Z87891 Personal history of nicotine dependence: Secondary | ICD-10-CM | POA: Diagnosis not present

## 2015-05-31 DIAGNOSIS — W01198A Fall on same level from slipping, tripping and stumbling with subsequent striking against other object, initial encounter: Secondary | ICD-10-CM | POA: Diagnosis not present

## 2015-05-31 DIAGNOSIS — S3289XA Fracture of other parts of pelvis, initial encounter for closed fracture: Secondary | ICD-10-CM | POA: Diagnosis not present

## 2015-05-31 DIAGNOSIS — W06XXXA Fall from bed, initial encounter: Secondary | ICD-10-CM | POA: Diagnosis not present

## 2015-05-31 DIAGNOSIS — S0990XA Unspecified injury of head, initial encounter: Secondary | ICD-10-CM | POA: Diagnosis not present

## 2015-05-31 DIAGNOSIS — S329XXA Fracture of unspecified parts of lumbosacral spine and pelvis, initial encounter for closed fracture: Secondary | ICD-10-CM

## 2015-05-31 DIAGNOSIS — S79911A Unspecified injury of right hip, initial encounter: Secondary | ICD-10-CM | POA: Diagnosis present

## 2015-05-31 MED ORDER — HYDROCODONE-ACETAMINOPHEN 5-325 MG PO TABS
1.0000 | ORAL_TABLET | Freq: Once | ORAL | Status: AC
  Administered 2015-05-31: 1 via ORAL
  Filled 2015-05-31: qty 1

## 2015-05-31 MED ORDER — HYDROCODONE-ACETAMINOPHEN 5-325 MG PO TABS: 1.0000 | ORAL_TABLET | ORAL | Status: DC | PRN

## 2015-05-31 NOTE — ED Notes (Signed)
Ambulated pt on the unit pt ambulated well no complaints noted at this time 

## 2015-05-31 NOTE — ED Notes (Signed)
Discharge instructions/prescription reviewed with patient. Understanding verbalized. No distress noted.

## 2015-05-31 NOTE — ED Notes (Signed)
Pt here for right hip pain. sts she slipped getting into the bed Friday night. sts right hip pain.

## 2015-05-31 NOTE — ED Provider Notes (Signed)
CSN: [id number]     Arrival date & time 05/31/15  1308 History   First MD Initiated Contact with Patient 05/31/15 1732     Chief Complaint  Patient presents with  . Hip Pain     (Consider location/radiation/quality/duration/timing/severity/associated sxs/prior Treatment) HPI Comments: Patient presents with right hip pain. She has a history of a prior hip fracture on the right. She states she has some chronic pain to that area. She states that 3 days ago she was going to sit down on her bed and fell straight down onto her buttocks. She did not hit her head. There is no loss of consciousness. She denies any neck or back pain. She's been having some increased pain in her right hip and pelvis area since that time. She has been able to ambulate but states it hurts to walk and she's been hobbling around with a cane. She does have a walker at home but has not been using it. She denies any other injuries from the fall.  Patient is a 74 y.o. female presenting with hip pain.  Hip Pain Pertinent negatives include no headaches.    Past Medical History  Diagnosis Date  . Arthritis   . Glaucoma   . Hypertension   . Fracture 10/05/11    "fell and broke right hip"  . Fracture of femoral neck, right ([place]) 10/12/2011  . Glaucoma     bilateral  . Polycythemia vera(238.4)   . Avascular necrosis of femur head, right ([place]) 04/23/2012  . Thrombocytosis ([place]) 04/23/2012  . Cellulitis of shoulder 09/28/2014  . Rhabdomyolysis 09/28/2014   Past Surgical History  Procedure Laterality Date  . Hip pinning,cannulated  10/12/2011    Procedure: CANNULATED HIP PINNING;  Surgeon: Johnny Bridge, MD;  Location: [facility];  Service: Orthopedics;  Laterality: Right;  . Colonoscopy    . Hardware removal  04/23/2012    Procedure: HARDWARE REMOVAL;  Surgeon: Johnny Bridge, MD;  Location: [facility];  Service: Orthopedics;  Laterality: Right;  . Hip arthroplasty  04/23/2012    Procedure: ARTHROPLASTY BIPOLAR HIP;  Surgeon: Johnny Bridge, MD;  Location: [facility];  Service: Orthopedics;  Laterality: Right;   History reviewed. No pertinent family history. Social History  Substance Use Topics  . Smoking status: Former Smoker -- 0.12 packs/day    Types: Cigarettes    Quit date: 08/15/1991  . Smokeless tobacco: Never Used  . Alcohol Use: No     Comment: 10/12/11 "did drink in my younger; don't drink at all now"   OB History    No data available     Review of Systems  Constitutional: Negative for fever.  Gastrointestinal: Negative for nausea and vomiting.  Musculoskeletal: Positive for arthralgias. Negative for back pain, joint swelling and neck pain.  Skin: Negative for wound.  Neurological: Negative for weakness, numbness and headaches.      Allergies  Review of patient's allergies indicates no known allergies.  Home Medications   Prior to Admission medications   Medication Sig Start Date End Date Taking? Authorizing Provider  ALPRAZolam (XANAX) 0.25 MG tablet Take 0.25 mg by mouth 2 (two) times daily as needed for anxiety.    Yes Historical Provider, MD  amLODipine (NORVASC) 10 MG tablet Take 10 mg by mouth daily.   Yes Historical Provider, MD  aspirin EC 81 MG tablet Take 81 mg by mouth daily.   Yes Historical Provider, MD  benazepril (LOTENSIN) 40 MG tablet Take 40 mg by mouth daily.  Yes Historical Provider, MD  carvedilol (COREG) 3.125 MG tablet Take 3.125 mg by mouth 2 (two) times daily with a meal.  11/18/13  Yes Historical Provider, MD  dorzolamide-timolol (COSOPT) 22.3-6.8 MG/ML ophthalmic solution Place 1 drop into both eyes 2 (two) times daily.   Yes Historical Provider, MD  hydroxyurea (HYDREA) 500 MG capsule Take 1,000-1,500 mg by mouth daily. 1500mg  MWF 1000mg  other days   Yes Historical Provider, MD  loratadine (CLARITIN) 10 MG tablet Take 10 mg by mouth daily as needed for allergies.   Yes Historical Provider, MD  meclizine (ANTIVERT) 25 MG tablet Take 25 mg by mouth 3 (three) times daily as  needed for dizziness.  03/25/14  Yes Historical Provider, MD  traMADol (ULTRAM) 50 MG tablet Take 100 mg by mouth every 12 (twelve) hours as needed for severe pain.  10/13/14  Yes Historical Provider, MD  TRAVATAN Z 0.004 % SOLN ophthalmic solution Place 1 drop into both eyes at bedtime. 05/26/15  Yes Historical Provider, MD  HYDROcodone-acetaminophen (NORCO/VICODIN) 5-325 MG tablet Take 1 tablet by mouth every 4 (four) hours as needed. 05/31/15   Malvin Johns, MD   BP 122/87 mmHg  Pulse 90  Temp(Src) 97.9 F (36.6 C) (Oral)  Resp 22  Ht 5\' 4"  (1.626 m)  Wt 128 lb 3 oz (58.145 kg)  BMI 21.99 kg/m2  SpO2 99% Physical Exam  Constitutional: She is oriented to person, place, and time. She appears well-developed and well-nourished.  HENT:  Head: Normocephalic and atraumatic.  Neck: Normal range of motion. Neck supple.  No pain to the cervical thoracic or lumbosacral spine  Cardiovascular: Normal rate.   Pulmonary/Chest: Effort normal.  Musculoskeletal: She exhibits tenderness. She exhibits no edema.  Positive tenderness on palpation of the right pelvis. No significant pain on range of motion of the right hip. There is no pain to the knee or the ankle. There is no swelling of the leg. Pedal pulses are intact. She has normal sensation and motor function distally.  Neurological: She is alert and oriented to person, place, and time.  Skin: Skin is warm and dry.  Psychiatric: She has a normal mood and affect.    ED Course  Procedures (including critical care time) Labs Review Labs Reviewed - No data to display  Imaging Review Dg Hip Unilat  With Pelvis 2-3 Views Right  05/31/2015  CLINICAL DATA:  Right sided pubic symphysis and right leg pain since a fall 4 days ago. EXAM: DG HIP (WITH OR WITHOUT PELVIS) 2-3V RIGHT COMPARISON:  Radiographs dated 11/05/2014 FINDINGS: There is a new fracture of the right side of the pubic body. There is marked osteopenia. There is resorption of bone around the  cement of the proximal right femoral prosthesis. Old deformity of the right greater trochanter. Moderately severe arthritis of the left hip. IMPRESSION: Acute right pubic body fracture. This is usually associated with sacral ala fractures although they are not apparent on this radiograph. Electronically Signed   By: Lorriane Shire M.D.   On: 05/31/2015 14:09   I have personally reviewed and evaluated these images and lab results as part of my medical decision-making.   EKG Interpretation None      MDM   Final diagnoses:  Pelvic fracture, closed, initial encounter    Patient has evidence of the pubic body fracture. There is no evidence of a hip fracture. She was given a dose of Vicodin in the ED. She is able to ambulate well with a walker. She  was discharged home in good condition. She was given a prescription for Vicodin to use for pain. She was advised to make a follow-up appointment with her PCP.    Malvin Johns, MD 05/31/15 2032

## 2015-05-31 NOTE — Discharge Instructions (Signed)
Simple Pelvic Fracture, Adult A pelvic fracture is a break in one of the pelvic bones. The pelvic bones include the bones that you sit on and the bones that make up the lower part of your spine. A pelvic fracture is called simple if the broken bones are stable and are not moving out of place. CAUSES  Common causes of this type of fracture include:  A fall.  A car accident.  Force or pressure applied to the pelvis. RISK FACTORS You may be at higher risk for this type of fracture if:  You play high-impact sports.  You are an older person with a condition that causes weak bones (osteoporosis).  You have a bone-weakening disease. SIGNS AND SYMPTOMS Signs and symptoms may include:  Tenderness, swelling, or bruising in the affected area.  Pain when moving the hip.  Pain when walking or standing. DIAGNOSIS A diagnosis is made with a physical exam and X-rays. Sometimes, a CT scan is also done. TREATMENT The goal of treatment is to get the bones to heal in a good position. Treatment of a simple pelvic fracture usually involves staying in bed (bed rest) and using crutches or a walker until the bones heal. Medicines may be prescribed for pain. Medicines may also be prescribed that help to prevent blood clots from forming in the legs. HOME CARE INSTRUCTIONS Managing Pain, Stiffness, and Swelling  If directed, apply ice to the injured area:  Put ice in a plastic bag.  Place a towel between your skin and the bag.  Leave the ice on for 20 minutes, 2-3 times a day.  Raise the injured area above the level of your heart while you are sitting or lying down. Driving  Do not  drive or operate heavy machinery until your health care provider tells you it is safe to do. Activity  Stay on bed rest for as long as directed by your health care provider.  While on bed rest:  Change the position of your legs every 1-2 hours. This keeps blood moving well through both of your legs.  You may sit  for as long as you feel comfortable.  After bed rest:  Avoid strenuous activities for as long as directed by your health care provider.  Return to your normal activities as directed by your health care provider. Ask your health care provider what activities are safe for you. Safety  Do not use the injured limb to support your body weight until your health care provider says that you can. Use crutches or a walker as directed by your health care provider. General Instructions  Do not use any tobacco products, including cigarettes, chewing tobacco, or electronic cigarettes. Tobacco can delay bone healing. If you need help quitting, ask your health care provider.  Take medicines only as directed by your health care provider.  Keep all follow-up visits as directed by your health care provider. This is important. SEEK MEDICAL CARE IF:  Your pain gets worse.  Your pain is not relieved with medicines. SEEK IMMEDIATE MEDICAL CARE IF:  You feel light-headed or faint.  You develop chest pain.  You develop shortness of breath.  You have a fever.  You have blood in your urine or your stools.  You have vaginal bleeding.  You have difficulty or pain with urination or with a bowel movement.  You have difficulty or increased pain with walking.  You have new or increased swelling in one of your legs.  You have numbness in your  legs or groin area.   This information is not intended to replace advice given to you by your health care provider. Make sure you discuss any questions you have with your health care provider.   Document Released: 10/09/2001 Document Revised: 08/21/2014 Document Reviewed: 03/24/2014 Elsevier Interactive Patient Education [employer].

## 2015-06-03 ENCOUNTER — Telehealth: Payer: Self-pay | Admitting: Oncology

## 2015-06-03 ENCOUNTER — Ambulatory Visit: Payer: Medicare HMO | Admitting: Nurse Practitioner

## 2015-06-03 ENCOUNTER — Telehealth: Payer: Self-pay | Admitting: *Deleted

## 2015-06-03 ENCOUNTER — Other Ambulatory Visit: Payer: Medicare HMO

## 2015-06-03 NOTE — Telephone Encounter (Signed)
Message from pt requesting to reschedule today's appointments. Reports she has a fractured pelvis and is unable to walk. Request to schedulers to contact pt.

## 2015-06-03 NOTE — Telephone Encounter (Signed)
s,w, pt and she did not want to r/s at this time due to pelvic fracture....she will call us back to r/s

## 2015-06-03 NOTE — Telephone Encounter (Signed)
Spoke with pt, she reports she began Hydrea 1500 mg daily after last visit. Fell at home on 10/14. Went to ED for pain on 10/17 was told she had a fractured pelvis. Ambulating with walker now. Pt declined to reschedule visit for now. She will call to reschedule when she is ambulating better. Pt decreased Hydrea to 1,000 mg on 10/18.  Pt went on to say she "passed out in [employer]" after her last visit to this office. Did not go to ED for evaluation. Dr. Benay Spice made aware of the above: Continue Hydrea 1,000 mg daily for now. Will check labs when pt is able to come in to office. Returned call to pt, she stated she will call for an appointment when she is ambulatory.

## 2015-06-03 NOTE — Telephone Encounter (Signed)
RETURNED PATIENT CALL AND PER PATIENT SHE FELL AND FRACTURED HER HIP AND WILL NOT BE ABLE TO KEEP APPOINTMENTS TODAY. PER PATIENT JUST CX FOR NOW AND SHE WILL CALL BACK TO RESCEDULE. PER PATIENT RIGHT NOW SHE IS NOT EVEN GETTING AROUND THE HOUSE GOOD OR GOING OUT - SHE LIVES ALONE AND DOES NOT KNOW WHEN SHE WILL BE ABLE TO DO ANYTHING. APPOINTMENTS CXD. MESSAGE TO LEFT FOR DESK NURE. PATIENT AWARE NURSE WILL CALL IF SHE HAS ANY QUESTIONS.

## 2015-07-16 ENCOUNTER — Telehealth: Payer: Self-pay | Admitting: Oncology

## 2015-07-16 NOTE — Telephone Encounter (Signed)
Returned patients call to reschedule her appointment and she did no answer nor have voicemail,a calendar has been mailed to her

## 2015-07-22 ENCOUNTER — Telehealth: Payer: Self-pay | Admitting: Oncology

## 2015-07-22 NOTE — Telephone Encounter (Signed)
returned call and s.w. pt and confirmed DEC appt.....pt ok and aware °

## 2015-07-29 ENCOUNTER — Telehealth: Payer: Self-pay | Admitting: Oncology

## 2015-07-29 ENCOUNTER — Other Ambulatory Visit (HOSPITAL_BASED_OUTPATIENT_CLINIC_OR_DEPARTMENT_OTHER): Payer: Medicare HMO

## 2015-07-29 ENCOUNTER — Ambulatory Visit (HOSPITAL_BASED_OUTPATIENT_CLINIC_OR_DEPARTMENT_OTHER): Payer: Medicare HMO | Admitting: Oncology

## 2015-07-29 VITALS — BP 144/77 | HR 84 | Temp 98.4°F | Resp 18 | Ht 64.0 in | Wt 125.4 lb

## 2015-07-29 DIAGNOSIS — D45 Polycythemia vera: Secondary | ICD-10-CM

## 2015-07-29 LAB — CBC WITH DIFFERENTIAL/PLATELET
BASO%: 0.2 % (ref 0.0–2.0)
Basophils Absolute: 0 10*3/uL (ref 0.0–0.1)
EOS%: 3.4 % (ref 0.0–7.0)
Eosinophils Absolute: 0.5 10*3/uL (ref 0.0–0.5)
HEMATOCRIT: 43.8 % (ref 34.8–46.6)
HGB: 12.5 g/dL (ref 11.6–15.9)
LYMPH%: 7.3 % — AB (ref 14.0–49.7)
MCH: 19.8 pg — AB (ref 25.1–34.0)
MCHC: 28.5 g/dL — AB (ref 31.5–36.0)
MCV: 69.3 fL — ABNORMAL LOW (ref 79.5–101.0)
MONO#: 0.8 10*3/uL (ref 0.1–0.9)
MONO%: 4.8 % (ref 0.0–14.0)
NEUT#: 13.2 10*3/uL — ABNORMAL HIGH (ref 1.5–6.5)
NEUT%: 84.3 % — AB (ref 38.4–76.8)
Platelets: 913 10*3/uL — ABNORMAL HIGH (ref 145–400)
RBC: 6.32 10*6/uL — AB (ref 3.70–5.45)
RDW: 25.5 % — ABNORMAL HIGH (ref 11.2–14.5)
WBC: 15.7 10*3/uL — ABNORMAL HIGH (ref 3.9–10.3)
lymph#: 1.2 10*3/uL (ref 0.9–3.3)
nRBC: 0 % (ref 0–0)

## 2015-07-29 NOTE — Progress Notes (Signed)
  [facility] OFFICE PROGRESS NOTE   Diagnosis: Polycythemia vera  INTERVAL HISTORY:   [name] returns as scheduled. She is taking hydroxyurea at a dose of 1000 mg daily. She decrease the dose to 1000 mg after a pelvic fracture in October. She last underwent phlebotomy 05/20/2015.  She is losing weight, but reports a good appetite. No pain or difficulty with bowel/bladder function. No bleeding. No fever or sweats.   Objective:  Vital signs in last 24 hours:  Blood pressure 144/77, pulse 84, temperature 98.4 F (36.9 C), temperature source Oral, resp. rate 18, height 5\' 4"  (1.626 m), weight 125 lb 6.4 oz (56.881 kg), SpO2 100 %.    HEENT: No thrush or ulcers Lymphatics: "Shotty "bilateral axillary nodes. No cervical, supra-clavicular, or inguinal nodes Resp: Lungs clear bilaterally Cardio: Regular rate and rhythm GI: No hepatosplenomegaly, no mass, nontender Vascular: No leg edema   Lab Results:  Lab Results  Component Value Date   WBC 15.7* 07/29/2015   HGB 12.5 07/29/2015   HCT 43.8 07/29/2015   MCV 69.3* 07/29/2015   PLT 913* 07/29/2015   NEUTROABS 13.2* 07/29/2015     Medications: I have reviewed the patient's current medications.  Assessment/Plan: 1. Polycythemia vera on hydroxyurea. Last phlebotomy 05/20/2015 2. Hypertension, followed by Dr. Montez Morita.  3. History of hyperpigmentation at the hands. Question related to hydroxyurea. 4. Hip fracture February 2013. She underwent a bipolar hip arthroplasty on 04/23/2012. 5. Thrombocytosis. Most likely secondary to polycythemia vera and iron deficiency. 6. Hospitalization with left shoulder cellulitis February 2016. 7. Pelvic fracture after a fall 05/31/2015       8.   Weight loss-etiology unclear  Disposition:  Alejandra [name] has polycythemia vera. The hemoglobin/hematocrit are at the upper end of the goal range. She will continue hydroxyurea at current dose and return for an office visit/CBC in one  month. She has lost approximately 15 pounds over the past 6 months. She is scheduled to see Dr. Katherine Roan in the near future. I cannot relate the weight loss to her history of polycythemia vera. We will check a chemistry panel when she returns next month.  Betsy Coder, MD  07/29/2015  2:27 PM

## 2015-07-29 NOTE — Telephone Encounter (Signed)
Gave and printed appt sched and avs for pt for Jan 2017 °

## 2015-07-29 NOTE — Telephone Encounter (Signed)
gv and printed appt sched and avs for pt for jan 2017

## 2015-08-30 ENCOUNTER — Other Ambulatory Visit: Payer: Medicare HMO

## 2015-08-30 ENCOUNTER — Ambulatory Visit: Payer: Medicare HMO | Admitting: Nurse Practitioner

## 2015-09-02 ENCOUNTER — Ambulatory Visit (HOSPITAL_BASED_OUTPATIENT_CLINIC_OR_DEPARTMENT_OTHER): Payer: Medicare HMO | Admitting: Nurse Practitioner

## 2015-09-02 ENCOUNTER — Other Ambulatory Visit (HOSPITAL_BASED_OUTPATIENT_CLINIC_OR_DEPARTMENT_OTHER): Payer: Medicare HMO

## 2015-09-02 ENCOUNTER — Telehealth: Payer: Self-pay | Admitting: Oncology

## 2015-09-02 ENCOUNTER — Telehealth: Payer: Self-pay | Admitting: Nurse Practitioner

## 2015-09-02 VITALS — BP 131/74 | HR 75 | Temp 98.0°F | Resp 18 | Ht 64.0 in | Wt 122.2 lb

## 2015-09-02 DIAGNOSIS — D45 Polycythemia vera: Secondary | ICD-10-CM

## 2015-09-02 LAB — CBC WITH DIFFERENTIAL/PLATELET
BASO%: 1.3 % (ref 0.0–2.0)
Basophils Absolute: 0.3 10*3/uL — ABNORMAL HIGH (ref 0.0–0.1)
EOS%: 5.4 % (ref 0.0–7.0)
Eosinophils Absolute: 1.2 10*3/uL — ABNORMAL HIGH (ref 0.0–0.5)
HCT: 45.2 % (ref 34.8–46.6)
HGB: 13 g/dL (ref 11.6–15.9)
LYMPH%: 6.6 % — ABNORMAL LOW (ref 14.0–49.7)
MCH: 19.7 pg — ABNORMAL LOW (ref 25.1–34.0)
MCHC: 28.7 g/dL — ABNORMAL LOW (ref 31.5–36.0)
MCV: 68.4 fL — ABNORMAL LOW (ref 79.5–101.0)
MONO#: 0.9 10*3/uL (ref 0.1–0.9)
MONO%: 4.2 % (ref 0.0–14.0)
NEUT#: 18 10*3/uL — ABNORMAL HIGH (ref 1.5–6.5)
NEUT%: 82.5 % — ABNORMAL HIGH (ref 38.4–76.8)
Platelets: 1331 10*3/uL — ABNORMAL HIGH (ref 145–400)
RBC: 6.6 10*6/uL — ABNORMAL HIGH (ref 3.70–5.45)
RDW: 23.9 % — ABNORMAL HIGH (ref 11.2–14.5)
WBC: 21.8 10*3/uL — ABNORMAL HIGH (ref 3.9–10.3)
lymph#: 1.4 10*3/uL (ref 0.9–3.3)

## 2015-09-02 LAB — COMPREHENSIVE METABOLIC PANEL
ALT: 11 U/L (ref 0–55)
AST: 23 U/L (ref 5–34)
Albumin: 3.9 g/dL (ref 3.5–5.0)
Alkaline Phosphatase: 90 U/L (ref 40–150)
Anion Gap: 6 mEq/L (ref 3–11)
BUN: 24.4 mg/dL (ref 7.0–26.0)
CO2: 28 mEq/L (ref 22–29)
Calcium: 9.8 mg/dL (ref 8.4–10.4)
Chloride: 106 mEq/L (ref 98–109)
Creatinine: 1 mg/dL (ref 0.6–1.1)
EGFR: 61 mL/min/{1.73_m2} — ABNORMAL LOW (ref >90)
Glucose: 91 mg/dl (ref 70–140)
Potassium: 4.3 mEq/L (ref 3.5–5.1)
Sodium: 140 mEq/L (ref 136–145)
Total Bilirubin: 0.67 mg/dL (ref 0.20–1.20)
Total Protein: 8.2 g/dL (ref 6.4–8.3)

## 2015-09-02 NOTE — Patient Instructions (Signed)
Increase Hydrea to 1500 mg (3 tabs) on Monday, Wednesday and Friday; continue 1000 mg (2 tabs) all other days

## 2015-09-02 NOTE — Telephone Encounter (Signed)
Gv pt appts for 1/26 + +2/16. °

## 2015-09-02 NOTE — Progress Notes (Signed)
  [facility] OFFICE PROGRESS NOTE   Diagnosis:  Polycythemia vera  INTERVAL HISTORY:   Ms. Moncion returns as scheduled. She continues hydroxyurea at a dose of 1000 mg daily. Last phlebotomy 05/20/2015.  She overall feels well. She reports a good appetite. She is eating 3 meals a day in addition to drinking nutritional supplements. She thinks the weight loss may have been related to "depression" several months ago related to "family issues". She denies nausea/vomiting. No abdominal pain no rash. No diarrhea. No bleeding.  Objective:  Vital signs in last 24 hours:  Blood pressure 131/74, pulse 75, temperature 98 F (36.7 C), temperature source Oral, resp. rate 18, height 5\' 4"  (1.626 m), weight 122 lb 3.2 oz (55.43 kg), SpO2 100 %.    HEENT: No thrush or ulcers. Lymphatics: No palpable cervical, supraclavicular, axillary lymph nodes. Resp: Lungs clear bilaterally. Cardio: Regular rate and rhythm. GI: Abdomen soft and nontender. No hepatomegaly. Vascular: No leg edema.   Lab Results:  Lab Results  Component Value Date   WBC 21.8* 09/02/2015   HGB 13.0 09/02/2015   HCT 45.2 09/02/2015   MCV 68.4* 09/02/2015   PLT 1,331* 09/02/2015   NEUTROABS 18.0* 09/02/2015    Imaging:  No results found.  Medications: I have reviewed the patient's current medications.  Assessment/Plan: 1. Polycythemia vera on hydroxyurea. Last phlebotomy 05/20/2015 2. Hypertension, followed by Dr. Montez Morita.  3. History of hyperpigmentation at the hands. Question related to hydroxyurea. 4. Hip fracture February 2013. She underwent a bipolar hip arthroplasty on 04/23/2012. 5. Thrombocytosis. Most likely secondary to polycythemia vera and iron deficiency. 6. Hospitalization with left shoulder cellulitis February 2016. 7. Pelvic fracture after a fall 05/31/2015 8. Weight loss-etiology unclear   Disposition: Ms. Nierman's hemoglobin and hematocrit are above goal range. She will return  next week for a phlebotomy. She was instructed to increase hydroxyurea to 1500 mg Mondays, Wednesdays and Fridays and continue 1000 mg all other days. She will return for a follow-up visit and CBC in one month.  Weight is down slightly as compared to last month. We will continue to monitor.  Plan reviewed with Dr. Benay Spice.    Ned Card ANP/GNP-BC   09/02/2015  11:30 AM

## 2015-09-02 NOTE — Telephone Encounter (Signed)
Stopped by to move 1/26 phlebot to 1/25 and a new avs has been printed

## 2015-09-08 ENCOUNTER — Ambulatory Visit: Payer: Medicare HMO

## 2015-09-08 VITALS — BP 124/66 | HR 68 | Temp 97.4°F | Resp 18

## 2015-09-08 DIAGNOSIS — D45 Polycythemia vera: Secondary | ICD-10-CM

## 2015-09-08 NOTE — Patient Instructions (Signed)

## 2015-09-08 NOTE — Progress Notes (Signed)
Pt tolerated phlebotomy without problems. 500 grams removed.

## 2015-09-30 ENCOUNTER — Ambulatory Visit (HOSPITAL_BASED_OUTPATIENT_CLINIC_OR_DEPARTMENT_OTHER): Payer: Medicare HMO | Admitting: Nurse Practitioner

## 2015-09-30 ENCOUNTER — Telehealth: Payer: Self-pay | Admitting: Nurse Practitioner

## 2015-09-30 ENCOUNTER — Other Ambulatory Visit (HOSPITAL_BASED_OUTPATIENT_CLINIC_OR_DEPARTMENT_OTHER): Payer: Medicare HMO

## 2015-09-30 VITALS — BP 116/86 | HR 68 | Temp 98.2°F | Resp 18 | Ht 64.0 in | Wt 122.0 lb

## 2015-09-30 DIAGNOSIS — D45 Polycythemia vera: Secondary | ICD-10-CM

## 2015-09-30 LAB — CBC WITH DIFFERENTIAL/PLATELET
BASO%: 1 % (ref 0.0–2.0)
Basophils Absolute: 0.1 10*3/uL (ref 0.0–0.1)
EOS%: 4.4 % (ref 0.0–7.0)
Eosinophils Absolute: 0.5 10*3/uL (ref 0.0–0.5)
HCT: 42.2 % (ref 34.8–46.6)
HGB: 12 g/dL (ref 11.6–15.9)
LYMPH%: 9.1 % — AB (ref 14.0–49.7)
MCH: 19.5 pg — AB (ref 25.1–34.0)
MCHC: 28.5 g/dL — AB (ref 31.5–36.0)
MCV: 68.6 fL — ABNORMAL LOW (ref 79.5–101.0)
MONO#: 0.4 10*3/uL (ref 0.1–0.9)
MONO%: 3 % (ref 0.0–14.0)
NEUT#: 9.8 10*3/uL — ABNORMAL HIGH (ref 1.5–6.5)
NEUT%: 82.5 % — AB (ref 38.4–76.8)
Platelets: 1371 10*3/uL — ABNORMAL HIGH (ref 145–400)
RBC: 6.15 10*6/uL — ABNORMAL HIGH (ref 3.70–5.45)
RDW: 22.7 % — ABNORMAL HIGH (ref 11.2–14.5)
WBC: 11.8 10*3/uL — ABNORMAL HIGH (ref 3.9–10.3)
lymph#: 1.1 10*3/uL (ref 0.9–3.3)

## 2015-09-30 NOTE — Progress Notes (Signed)
  [facility] OFFICE PROGRESS NOTE   Diagnosis:  Polycythemia vera  INTERVAL HISTORY:   Alejandra [name] returns as scheduled. She continues hydroxyurea, current dose 1500 mg Monday Wednesday Friday and 1000 mg other days. Last phlebotomy 09/08/2015. She denies nausea/vomiting. No mouth sores. No diarrhea. No rash. She reports a good appetite. She estimates "two good meals" a day.  Objective:  Vital signs in last 24 hours:  Blood pressure 116/86, pulse 68, temperature 98.2 F (36.8 C), temperature source Oral, resp. rate 18, height 5\' 4"  (1.626 m), weight 122 lb (55.339 kg), SpO2 100 %.    HEENT: No thrush or ulcers. Lymphatics: No palpable cervical or supra-clavicular lymph nodes. Resp: Lungs clear bilaterally. Cardio: Regular rate and rhythm. GI: No organomegaly. Vascular: No leg edema.   Lab Results:  Lab Results  Component Value Date   WBC 11.8* 09/30/2015   HGB 12.0 09/30/2015   HCT 42.2 09/30/2015   MCV 68.6* 09/30/2015   PLT 1,371* 09/30/2015   NEUTROABS 9.8* 09/30/2015    Imaging:  No results found.  Medications: I have reviewed the patient's current medications.  Assessment/Plan: 1. Polycythemia vera on hydroxyurea. Last phlebotomy 09/08/2015 2. Hypertension, followed by Dr. Montez Morita.  3. History of hyperpigmentation at the hands. Question related to hydroxyurea. 4. Hip fracture February 2013. She underwent a bipolar hip arthroplasty on 04/23/2012. 5. Thrombocytosis. Most likely secondary to polycythemia vera and iron deficiency. 6. Hospitalization with left shoulder cellulitis February 2016. 7. Pelvic fracture after a fall 05/31/2015 8. Weight loss-etiology unclear; weight stable 09/30/2015   Disposition: [name] appears stable. Hemoglobin/hematocrit in goal range. She will continue hydroxyurea at the current dose. She will return for a follow-up CBC in 4 weeks. Next visit in 8 weeks. She will contact the office in the interim with any  problems.  Plan reviewed with Dr. Benay Spice.    Ned Card ANP/GNP-BC   09/30/2015  12:33 PM

## 2015-09-30 NOTE — Telephone Encounter (Signed)
Pt confirmed labs/ov per 02/16 POF, gave pt AVS and Calendar.... KJ °

## 2015-10-26 ENCOUNTER — Telehealth: Payer: Self-pay | Admitting: Oncology

## 2015-10-26 NOTE — Telephone Encounter (Signed)
pt cld & left a message-cld pt back and r/s lab appt for 3/23@ 10:15

## 2015-10-28 ENCOUNTER — Other Ambulatory Visit: Payer: Medicare HMO

## 2015-11-04 ENCOUNTER — Other Ambulatory Visit (HOSPITAL_BASED_OUTPATIENT_CLINIC_OR_DEPARTMENT_OTHER): Payer: Medicare HMO

## 2015-11-04 DIAGNOSIS — D45 Polycythemia vera: Secondary | ICD-10-CM

## 2015-11-04 LAB — CBC WITH DIFFERENTIAL/PLATELET
BASO%: 1.6 % (ref 0.0–2.0)
Basophils Absolute: 0.3 10*3/uL — ABNORMAL HIGH (ref 0.0–0.1)
EOS%: 3.3 % (ref 0.0–7.0)
Eosinophils Absolute: 0.6 10*3/uL — ABNORMAL HIGH (ref 0.0–0.5)
HEMATOCRIT: 43.2 % (ref 34.8–46.6)
HGB: 12.5 g/dL (ref 11.6–15.9)
LYMPH%: 6.7 % — ABNORMAL LOW (ref 14.0–49.7)
MCH: 19.9 pg — AB (ref 25.1–34.0)
MCHC: 28.8 g/dL — AB (ref 31.5–36.0)
MCV: 69 fL — ABNORMAL LOW (ref 79.5–101.0)
MONO#: 0.7 10*3/uL (ref 0.1–0.9)
MONO%: 4 % (ref 0.0–14.0)
NEUT#: 14.4 10*3/uL — ABNORMAL HIGH (ref 1.5–6.5)
NEUT%: 84.4 % — AB (ref 38.4–76.8)
Platelets: 1480 10*3/uL — ABNORMAL HIGH (ref 145–400)
RBC: 6.26 10*6/uL — AB (ref 3.70–5.45)
RDW: 23.4 % — ABNORMAL HIGH (ref 11.2–14.5)
WBC: 17.1 10*3/uL — ABNORMAL HIGH (ref 3.9–10.3)
lymph#: 1.2 10*3/uL (ref 0.9–3.3)

## 2015-11-05 ENCOUNTER — Other Ambulatory Visit: Payer: Medicare HMO

## 2015-11-05 ENCOUNTER — Telehealth: Payer: Self-pay | Admitting: Nurse Practitioner

## 2015-11-05 NOTE — Telephone Encounter (Signed)
-----   Message from Owens Shark, NP sent at 11/05/2015  2:02 PM EDT ----- Please have her increase Hydrea to 1500 mg on m,w,f,sat; 1000 mg other days

## 2015-11-05 NOTE — Telephone Encounter (Signed)
TC to Pt. As per Owens Shark to inform Pt. Of dose change for Hydrea to 1500mg  on M,W,F, and Sat. 1000 mg all other days. No answer will try call again.

## 2015-11-07 ENCOUNTER — Other Ambulatory Visit: Payer: Self-pay

## 2015-11-07 ENCOUNTER — Encounter (HOSPITAL_COMMUNITY): Payer: Self-pay

## 2015-11-07 ENCOUNTER — Emergency Department (HOSPITAL_COMMUNITY): Payer: Medicare HMO

## 2015-11-07 ENCOUNTER — Emergency Department (HOSPITAL_COMMUNITY)
Admission: EM | Admit: 2015-11-07 | Discharge: 2015-11-07 | Disposition: A | Discharge status: Home / Self Care | Payer: Medicare HMO | Attending: Emergency Medicine | Admitting: Emergency Medicine

## 2015-11-07 DIAGNOSIS — S0003XA Contusion of scalp, initial encounter: Secondary | ICD-10-CM | POA: Diagnosis not present

## 2015-11-07 DIAGNOSIS — Z8781 Personal history of (healed) traumatic fracture: Secondary | ICD-10-CM | POA: Diagnosis not present

## 2015-11-07 DIAGNOSIS — Z87891 Personal history of nicotine dependence: Secondary | ICD-10-CM | POA: Insufficient documentation

## 2015-11-07 DIAGNOSIS — I10 Essential (primary) hypertension: Secondary | ICD-10-CM | POA: Diagnosis not present

## 2015-11-07 DIAGNOSIS — Z79899 Other long term (current) drug therapy: Secondary | ICD-10-CM | POA: Insufficient documentation

## 2015-11-07 DIAGNOSIS — M1990 Unspecified osteoarthritis, unspecified site: Secondary | ICD-10-CM | POA: Insufficient documentation

## 2015-11-07 DIAGNOSIS — S0990XA Unspecified injury of head, initial encounter: Secondary | ICD-10-CM

## 2015-11-07 DIAGNOSIS — Z8739 Personal history of other diseases of the musculoskeletal system and connective tissue: Secondary | ICD-10-CM | POA: Insufficient documentation

## 2015-11-07 DIAGNOSIS — Y9389 Activity, other specified: Secondary | ICD-10-CM | POA: Insufficient documentation

## 2015-11-07 DIAGNOSIS — W1839XA Other fall on same level, initial encounter: Secondary | ICD-10-CM | POA: Insufficient documentation

## 2015-11-07 DIAGNOSIS — Z86018 Personal history of other benign neoplasm: Secondary | ICD-10-CM | POA: Insufficient documentation

## 2015-11-07 DIAGNOSIS — S7001XA Contusion of right hip, initial encounter: Secondary | ICD-10-CM

## 2015-11-07 DIAGNOSIS — Z872 Personal history of diseases of the skin and subcutaneous tissue: Secondary | ICD-10-CM | POA: Diagnosis not present

## 2015-11-07 DIAGNOSIS — Y998 Other external cause status: Secondary | ICD-10-CM | POA: Insufficient documentation

## 2015-11-07 DIAGNOSIS — R55 Syncope and collapse: Secondary | ICD-10-CM | POA: Diagnosis not present

## 2015-11-07 DIAGNOSIS — S79911A Unspecified injury of right hip, initial encounter: Secondary | ICD-10-CM | POA: Diagnosis present

## 2015-11-07 DIAGNOSIS — Z7982 Long term (current) use of aspirin: Secondary | ICD-10-CM | POA: Insufficient documentation

## 2015-11-07 DIAGNOSIS — H409 Unspecified glaucoma: Secondary | ICD-10-CM | POA: Insufficient documentation

## 2015-11-07 DIAGNOSIS — Y9259 Other trade areas as the place of occurrence of the external cause: Secondary | ICD-10-CM | POA: Diagnosis not present

## 2015-11-07 LAB — URINALYSIS, ROUTINE W REFLEX MICROSCOPIC
BILIRUBIN URINE: NEGATIVE
Glucose, UA: NEGATIVE mg/dL
Hgb urine dipstick: NEGATIVE
KETONES UR: NEGATIVE mg/dL
LEUKOCYTES UA: NEGATIVE
NITRITE: NEGATIVE
Protein, ur: NEGATIVE mg/dL
Specific Gravity, Urine: 1.019 (ref 1.005–1.030)
pH: 7.5 (ref 5.0–8.0)

## 2015-11-07 LAB — CBC WITH DIFFERENTIAL/PLATELET
Basophils Absolute: 0.3 10*3/uL — ABNORMAL HIGH (ref 0.0–0.1)
Basophils Relative: 2 %
EOS ABS: 0.4 10*3/uL (ref 0.0–0.7)
Eosinophils Relative: 3 %
HCT: 42.9 % (ref 36.0–46.0)
Hemoglobin: 12.3 g/dL (ref 12.0–15.0)
Lymphocytes Relative: 9 %
Lymphs Abs: 1.2 10*3/uL (ref 0.7–4.0)
MCH: 20.2 pg — ABNORMAL LOW (ref 26.0–34.0)
MCHC: 28.7 g/dL — ABNORMAL LOW (ref 30.0–36.0)
MCV: 70.4 fL — ABNORMAL LOW (ref 78.0–100.0)
MONO ABS: 0.8 10*3/uL (ref 0.1–1.0)
Monocytes Relative: 6 %
Neutro Abs: 10.4 10*3/uL — ABNORMAL HIGH (ref 1.7–7.7)
Neutrophils Relative %: 80 %
PLATELETS: 1435 10*3/uL — AB (ref 150–400)
RBC: 6.09 MIL/uL — AB (ref 3.87–5.11)
RDW: 23.7 % — AB (ref 11.5–15.5)
WBC: 13.1 10*3/uL — AB (ref 4.0–10.5)

## 2015-11-07 LAB — BASIC METABOLIC PANEL
Anion gap: 9 (ref 5–15)
BUN: 18 mg/dL (ref 6–20)
CALCIUM: 9.6 mg/dL (ref 8.9–10.3)
CHLORIDE: 107 mmol/L (ref 101–111)
CO2: 26 mmol/L (ref 22–32)
CREATININE: 0.9 mg/dL (ref 0.44–1.00)
GFR calc non Af Amer: >60 mL/min (ref >60)
Glucose, Bld: 93 mg/dL (ref 65–99)
Potassium: 3.8 mmol/L (ref 3.5–5.1)
Sodium: 142 mmol/L (ref 135–145)

## 2015-11-07 NOTE — ED Provider Notes (Addendum)
CSN: [id number]     Arrival date & time 11/07/15  1124 History   First MD Initiated Contact with Patient 11/07/15 1234     Chief Complaint  Patient presents with  . Hip Pain     (Consider location/radiation/quality/duration/timing/severity/associated sxs/prior Treatment) HPI Comments: Patient with a history of hypertension, polycythemia vera, and arthritis presents with head pain and hip pain after recent fall. She states that 3 days ago she was at [facility] and felt like she was given a pass out. She got caught in lightheaded and fell to the ground. She did not completely pass out. She hit her head either on a counter or on the ground. There is no loss of consciousness. She has a knot on her head and it's been bothering her since that time. She denies any neck or back pain. She does have pain in her right hip where she fell on the hip. She states that she recently been to [place] [facility] to have blood drawn that day when she fell. She had not eaten breakfast prior to this event. She's had no further episodes of dizziness. She denies any chest pain or shortness of breath. She denies any fevers cough or recent illnesses. She denies any urinary symptoms. She is not on anticoagulants.  Patient is a 75 y.o. female presenting with hip pain.  Hip Pain Associated symptoms include headaches. Pertinent negatives include no chest pain, no abdominal pain and no shortness of breath.    Past Medical History  Diagnosis Date  . Arthritis   . Glaucoma   . Hypertension   . Fracture 10/05/11    "fell and broke right hip"  . Fracture of femoral neck, right ([place]) 10/12/2011  . Glaucoma     bilateral  . Polycythemia vera(238.4)   . Avascular necrosis of femur head, right ([place]) 04/23/2012  . Thrombocytosis ([place]) 04/23/2012  . Cellulitis of shoulder 09/28/2014  . Rhabdomyolysis 09/28/2014   Past Surgical History  Procedure Laterality Date  . Hip pinning,cannulated  10/12/2011    Procedure: CANNULATED HIP PINNING;   Surgeon: Johnny Bridge, MD;  Location: [facility];  Service: Orthopedics;  Laterality: Right;  . Colonoscopy    . Hardware removal  04/23/2012    Procedure: HARDWARE REMOVAL;  Surgeon: Johnny Bridge, MD;  Location: [facility];  Service: Orthopedics;  Laterality: Right;  . Hip arthroplasty  04/23/2012    Procedure: ARTHROPLASTY BIPOLAR HIP;  Surgeon: Johnny Bridge, MD;  Location: [facility];  Service: Orthopedics;  Laterality: Right;   History reviewed. No pertinent family history. Social History  Substance Use Topics  . Smoking status: Former Smoker -- 0.12 packs/day    Types: Cigarettes    Quit date: 08/15/1991  . Smokeless tobacco: Never Used  . Alcohol Use: No     Comment: 10/12/11 "did drink in my younger; don't drink at all now"   OB History    No data available     Review of Systems  Constitutional: Negative for fever, chills, diaphoresis and fatigue.  HENT: Negative for congestion, rhinorrhea and sneezing.   Eyes: Negative.   Respiratory: Negative for cough, chest tightness and shortness of breath.   Cardiovascular: Negative for chest pain and leg swelling.  Gastrointestinal: Negative for nausea, vomiting, abdominal pain, diarrhea and blood in stool.  Genitourinary: Negative for frequency, hematuria, flank pain and difficulty urinating.  Musculoskeletal: Positive for arthralgias. Negative for back pain.  Skin: Negative for rash.  Neurological: Positive for syncope (near syncope) and headaches. Negative  for dizziness, speech difficulty, weakness and numbness.      Allergies  Review of patient's allergies indicates no known allergies.  Home Medications   Prior to Admission medications   Medication Sig Start Date End Date Taking? Authorizing Provider  ALPRAZolam (XANAX) 0.25 MG tablet Take 0.25 mg by mouth 2 (two) times daily as needed for anxiety.    Yes Historical Provider, MD  amLODipine (NORVASC) 10 MG tablet Take 10 mg by mouth daily.   Yes Historical Provider, MD  aspirin  EC 81 MG tablet Take 81 mg by mouth daily.   Yes Historical Provider, MD  atorvastatin (LIPITOR) 10 MG tablet Take 10 mg by mouth daily. 07/12/15  Yes Historical Provider, MD  benazepril (LOTENSIN) 40 MG tablet Take 40 mg by mouth daily.   Yes Historical Provider, MD  carvedilol (COREG) 3.125 MG tablet Take 3.125 mg by mouth daily.  11/18/13  Yes Historical Provider, MD  dorzolamide-timolol (COSOPT) 22.3-6.8 MG/ML ophthalmic solution Place 1 drop into both eyes 2 (two) times daily.   Yes Historical Provider, MD  hydroxyurea (HYDREA) 500 MG capsule Take 1,000-1,500 mg by mouth daily. 1500mg  on Monday Wednesday Friday all other days are 1000 mg   Yes Historical Provider, MD  loratadine (CLARITIN) 10 MG tablet Take 10 mg by mouth daily as needed for allergies.   Yes Historical Provider, MD  meclizine (ANTIVERT) 25 MG tablet Take 25 mg by mouth 3 (three) times daily as needed for dizziness.  03/25/14  Yes Historical Provider, MD  traMADol (ULTRAM) 50 MG tablet Take 100 mg by mouth every 12 (twelve) hours as needed for severe pain.  10/13/14  Yes Historical Provider, MD  TRAVATAN Z 0.004 % SOLN ophthalmic solution Place 1 drop into both eyes at bedtime. 05/26/15  Yes Historical Provider, MD   BP 136/90 mmHg  Pulse 73  Temp(Src) 98.3 F (36.8 C) (Oral)  Resp 16  Ht 5' 3.5" (1.613 m)  Wt 115 lb 3.2 oz (52.254 kg)  BMI 20.08 kg/m2  SpO2 100% Physical Exam  Constitutional: She is oriented to person, place, and time. She appears well-developed and well-nourished.  HENT:  Head: Normocephalic.  Moderate hematoma to the right parietal area  Eyes: Pupils are equal, round, and reactive to light.  Neck: Normal range of motion. Neck supple.  No pain to the cervical thoracic or lumbosacral spine  Cardiovascular: Normal rate, regular rhythm and normal heart sounds.   Pulmonary/Chest: Effort normal and breath sounds normal. No respiratory distress. She has no wheezes. She has no rales. She exhibits no  tenderness.  Abdominal: Soft. Bowel sounds are normal. There is no tenderness. There is no rebound and no guarding.  Musculoskeletal: Normal range of motion. She exhibits no edema.  Positive tenderness on range of motion of the right hip. There is no other pain on palpation or range of motion of the extremities.  Lymphadenopathy:    She has no cervical adenopathy.  Neurological: She is alert and oriented to person, place, and time. She has normal strength. No cranial nerve deficit or sensory deficit. GCS eye subscore is 4. GCS verbal subscore is 5. GCS motor subscore is 6.  Skin: Skin is warm and dry. No rash noted.  Psychiatric: She has a normal mood and affect.    ED Course  Procedures (including critical care time) Labs Review Labs Reviewed  CBC WITH DIFFERENTIAL/PLATELET - Abnormal; Notable for the following:    WBC 13.1 (*)    RBC 6.09 (*)  MCV 70.4 (*)    MCH 20.2 (*)    MCHC 28.7 (*)    RDW 23.7 (*)    Platelets 1435 (*)    Neutro Abs 10.4 (*)    Basophils Absolute 0.3 (*)    All other components within normal limits  BASIC METABOLIC PANEL  URINALYSIS, ROUTINE W REFLEX MICROSCOPIC (NOT AT [facility])    Imaging Review Ct Head Wo Contrast  11/07/2015  CLINICAL DATA:  75 year old female who fell at [employer] 3 days ago. Right groin pain. Dizziness. Initial encounter. Initial encounter. EXAM: CT HEAD WITHOUT CONTRAST TECHNIQUE: Contiguous axial images were obtained from the base of the skull through the vertex without intravenous contrast. COMPARISON:  None. FINDINGS: Right scalp contusion identified on series 202, image 44, near the vertex. Underlying calvarium intact. No other scalp soft tissue injury identified. Visualized orbit soft tissues are within normal limits. Visualized paranasal sinuses and mastoids are clear. No acute osseous abnormality identified. Calcified atherosclerosis at the skull base. No midline shift, mass effect, or evidence of intracranial mass lesion. No acute  intracranial hemorrhage identified. Mild for age nonspecific posterior periventricular white matter hypodensity. Elsewhere normal gray-white matter differentiation. No cortically based acute infarct identified. No suspicious intracranial vascular hyperdensity. IMPRESSION: 1. Right scalp soft tissue injury without underlying fracture. 2. Largely unremarkable for age noncontrast CT appearance of the brain. Electronically Signed   By: Genevie Ann M.D.   On: 11/07/2015 13:44   Dg Hip Unilat With Pelvis 2-3 Views Right  11/07/2015  CLINICAL DATA:  75 year old female who fell at [place] 3 days ago. Right groin pain. Previous hip replacement. Initial encounter. EXAM: DG HIP (WITH OR WITHOUT PELVIS) 2-3V RIGHT COMPARISON:  05/31/2015. FINDINGS: Right proximal femoral arthroplasty re- demonstrated. Hardware appears intact with stable alignment with the right acetabulum. Bone cement in the proximal right femoral shaft appears stable. Dystrophic calcification about the greater trochanter again noted. No acute proximal right femur fracture identified. Osteopenia about the pelvis. Chronic medial right pubic rami fractures with interval callus. No superimposed acute pelvis fracture is identified. Stable visualized proximal left femur. IMPRESSION: 1. Stable postoperative appearance of the proximal right femur. 2. Interval healing of medial right pubic rami fractures sustained in October 2016. 3. No new fracture or dislocation identified about the right hip or pelvis. Electronically Signed   By: Genevie Ann M.D.   On: 11/07/2015 13:41   I have personally reviewed and evaluated these images and lab results as part of my medical decision-making.   EKG Interpretation   Date/Time:  Sunday November 07 2015 12:38:27 EDT Ventricular Rate:  78 PR Interval:  176 QRS Duration: 128 QT Interval:  414 QTC Calculation: 472 R Axis:   -33 Text Interpretation:  Sinus rhythm Right bundle branch block RBBB new as  compared to prior EKG  Confirmed by Norville Dani  MD, Burnett Lieber ([id number]) on 11/07/2015  1:18:48 PM      MDM   Final diagnoses:  Near syncope  Contusion, hip, right, initial encounter  Head injury, initial encounter    Patient presents with injuries after a near syncopal episode happened 3 days ago. She states she feels like it was because she didn't eat anything that day. She had no symptoms that would be more suggestive of acute coronary syndrome. His EKG does have a new right bundle branch block but no ischemic changes and she doesn't have any cardiac symptoms of angina. She has no evidence of intracranial hemorrhage or skull fracture. There is no bony injury  to the hip. No other injuries are identified. Her labs are unremarkable. Her platelet count is chronically elevated and is similar to her prior values. She was discharged home in good condition. She was advised to follow-up with her PCP for a recheck or return here as needed for any worsening symptoms.    Malvin Johns, MD 11/07/15 [place], MD 11/07/15 [phone]

## 2015-11-07 NOTE — Discharge Instructions (Signed)
Contusion A contusion is a deep bruise. Contusions are the result of a blunt injury to tissues and muscle fibers under the skin. The injury causes bleeding under the skin. The skin overlying the contusion may turn blue, purple, or yellow. Minor injuries will give you a painless contusion, but more severe contusions may stay painful and swollen for a few weeks.  CAUSES  This condition is usually caused by a blow, trauma, or direct force to an area of the body. SYMPTOMS  Symptoms of this condition include:  Swelling of the injured area.  Pain and tenderness in the injured area.  Discoloration. The area may have redness and then turn blue, purple, or yellow. DIAGNOSIS  This condition is diagnosed based on a physical exam and medical history. An X-ray, CT scan, or MRI may be needed to determine if there are any associated injuries, such as broken bones (fractures). TREATMENT  Specific treatment for this condition depends on what area of the body was injured. In general, the best treatment for a contusion is resting, icing, applying pressure to (compression), and elevating the injured area. This is often called the RICE strategy. Over-the-counter anti-inflammatory medicines may also be recommended for pain control.  HOME CARE INSTRUCTIONS   Rest the injured area.  If directed, apply ice to the injured area:  Put ice in a plastic bag.  Place a towel between your skin and the bag.  Leave the ice on for 20 minutes, 2-3 times per day.  If directed, apply light compression to the injured area using an elastic bandage. Make sure the bandage is not wrapped too tightly. Remove and reapply the bandage as directed by your health care provider.  If possible, raise (elevate) the injured area above the level of your heart while you are sitting or lying down.  Take over-the-counter and prescription medicines only as told by your health care provider. SEEK MEDICAL CARE IF:  Your symptoms do not  improve after several days of treatment.  Your symptoms get worse.  You have difficulty moving the injured area. SEEK IMMEDIATE MEDICAL CARE IF:   You have severe pain.  You have numbness in a hand or foot.  Your hand or foot turns pale or cold.   This information is not intended to replace advice given to you by your health care provider. Make sure you discuss any questions you have with your health care provider.   Document Released: 05/10/2005 Document Revised: 04/21/2015 Document Reviewed: 12/16/2014 Elsevier Interactive Patient Education 2016 [place] Injury, Adult You have a head injury. Headaches and throwing up (vomiting) are common after a head injury. It should be easy to wake up from sleeping. Sometimes you must stay in the hospital. Most problems happen within the first 24 hours. Side effects may occur up to 7-10 days after the injury.  WHAT ARE THE TYPES OF HEAD INJURIES? Head injuries can be as minor as a bump. Some head injuries can be more severe. More severe head injuries include:  A jarring injury to the brain (concussion).  A bruise of the brain (contusion). This mean there is bleeding in the brain that can cause swelling.  A cracked skull (skull fracture).  Bleeding in the brain that collects, clots, and forms a bump (hematoma). WHEN SHOULD I GET HELP RIGHT AWAY?   You are confused or sleepy.  You cannot be woken up.  You feel sick to your stomach (nauseous) or keep throwing up (vomiting).  Your dizziness or unsteadiness  is getting worse.  You have very bad, lasting headaches that are not helped by medicine. Take medicines only as told by your doctor.  You cannot use your arms or legs like normal.  You cannot walk.  You notice changes in the black spots in the center of the colored part of your eye (pupil).  You have clear or bloody fluid coming from your nose or ears.  You have trouble seeing. During the next 24 hours after the  injury, you must stay with someone who can watch you. This person should get help right away (call 911 in the [place] if you start to shake and are not able to control it (have seizures), you pass out, or you are unable to wake up. HOW CAN I PREVENT A HEAD INJURY IN THE FUTURE?  Wear seat belts.  Wear a helmet while bike riding and playing sports like football.  Stay away from dangerous activities around the house. WHEN CAN I RETURN TO NORMAL ACTIVITIES AND ATHLETICS? See your doctor before doing these activities. You should not do normal activities or play contact sports until 1 week after the following symptoms have stopped:  Headache that does not go away.  Dizziness.  Poor attention.  Confusion.  Memory problems.  Sickness to your stomach or throwing up.  Tiredness.  Fussiness.  Bothered by bright lights or loud noises.  Anxiousness or depression.  Restless sleep. MAKE SURE YOU:   Understand these instructions.  Will watch your condition.  Will get help right away if you are not doing well or get worse.   This information is not intended to replace advice given to you by your health care provider. Make sure you discuss any questions you have with your health care provider.   Document Released: 07/13/2008 Document Revised: 08/21/2014 Document Reviewed: 04/07/2013 Elsevier Interactive Patient Education 2016 [employer].  Near-Syncope Near-syncope (commonly known as near fainting) is sudden weakness, dizziness, or feeling like you might pass out. This can happen when getting up or while standing for a long time. It is caused by a sudden decrease in blood flow to the brain, which can occur for various reasons. Most of the reasons are not serious.  HOME CARE Watch your condition for any changes.  Have someone stay with you until you feel stable.  If you feel like you are going to pass out:  Lie down right away.  Prop your feet up if you can.  Breathe deeply  and steadily.  Move only when the feeling has gone away. Most of the time, this feeling lasts only a few minutes. You may feel tired for several hours.  Drink enough fluids to keep your pee (urine) clear or pale yellow.  If you are taking blood pressure or heart medicine, stand up slowly.  Follow up with your doctor as told. GET HELP RIGHT AWAY IF:   You have a severe headache.  You have unusual pain in the chest, belly (abdomen), or back.  You have bleeding from the mouth or butt (rectum), or you have black or tarry poop (stool).  You feel your heart beat differently than normal, or you have a very fast pulse.  You pass out, or you twitch and shake when you pass out.  You pass out when sitting or lying down.  You feel confused.  You have trouble walking.  You are weak.  You have vision problems. MAKE SURE YOU:   Understand these instructions.  Will watch your condition.  Will get help right away if you are not doing well or get worse.   This information is not intended to replace advice given to you by your health care provider. Make sure you discuss any questions you have with your health care provider.   Document Released: 01/17/2008 Document Revised: 08/21/2014 Document Reviewed: 01/03/2013 Elsevier Interactive Patient Education [employer].

## 2015-11-07 NOTE — ED Notes (Signed)
Onset 11-04-15 pt was in [place], got hot and dizzy and fell on right hip.  Pt was given glass of water and felt fine afterwards, no more episodes of dizziness since.  Pt c/o right hip pain.

## 2015-11-08 LAB — PATHOLOGIST SMEAR REVIEW

## 2015-11-09 ENCOUNTER — Telehealth: Payer: Self-pay | Admitting: Nurse Practitioner

## 2015-11-09 NOTE — Telephone Encounter (Signed)
Follow up call to inquire if Pt. Received message about dose change no answer will try again.

## 2015-11-09 NOTE — Telephone Encounter (Signed)
Return TC to Pt. To inform her of dose change as per Owens Shark NP. Pt. Stated she did receive the message. Pt. Verbalized that Hydrea dose was change to 1500 mg on M, W, F , and Tues, and Thurs 1000mg  . Also inquired about visit to the ER Pt. stated that she was OK . Shje said she had blood work early in the morning and didn't eat anything stated she passed out in [place]. Informed her next time to bring something with her after blood work to eat. Pt. Verbalized understanding. No further questions at this time.

## 2015-11-25 ENCOUNTER — Ambulatory Visit: Payer: Medicare HMO | Admitting: Oncology

## 2015-11-25 ENCOUNTER — Other Ambulatory Visit: Payer: Medicare HMO

## 2015-12-01 ENCOUNTER — Telehealth: Payer: Self-pay | Admitting: Oncology

## 2015-12-01 NOTE — Telephone Encounter (Signed)
pt cld to r/s appt-gave pt time & date of r/s appt °

## 2015-12-02 ENCOUNTER — Other Ambulatory Visit: Payer: Medicare HMO

## 2015-12-02 ENCOUNTER — Ambulatory Visit: Payer: Medicare HMO | Admitting: Oncology

## 2015-12-10 ENCOUNTER — Telehealth: Payer: Self-pay | Admitting: Oncology

## 2015-12-10 NOTE — Telephone Encounter (Signed)
returned call pt line busy

## 2015-12-13 ENCOUNTER — Telehealth: Payer: Self-pay | Admitting: Oncology

## 2015-12-13 NOTE — Telephone Encounter (Signed)
Pt needed to resched 5/2 appt to 5/4.Marland Kitchen Gave pt a copy of appt

## 2015-12-14 ENCOUNTER — Other Ambulatory Visit: Payer: Medicare HMO

## 2015-12-14 ENCOUNTER — Ambulatory Visit: Payer: Medicare HMO | Admitting: Oncology

## 2015-12-16 ENCOUNTER — Telehealth: Payer: Self-pay | Admitting: Oncology

## 2015-12-16 ENCOUNTER — Ambulatory Visit (HOSPITAL_BASED_OUTPATIENT_CLINIC_OR_DEPARTMENT_OTHER): Payer: Medicare HMO | Admitting: Oncology

## 2015-12-16 ENCOUNTER — Other Ambulatory Visit (HOSPITAL_BASED_OUTPATIENT_CLINIC_OR_DEPARTMENT_OTHER): Payer: Medicare HMO

## 2015-12-16 VITALS — BP 118/91 | HR 89 | Temp 97.9°F | Resp 18 | Ht 63.5 in | Wt 116.6 lb

## 2015-12-16 DIAGNOSIS — D45 Polycythemia vera: Secondary | ICD-10-CM

## 2015-12-16 DIAGNOSIS — R634 Abnormal weight loss: Secondary | ICD-10-CM | POA: Diagnosis not present

## 2015-12-16 LAB — CBC WITH DIFFERENTIAL/PLATELET
BASO%: 1.1 % (ref 0.0–2.0)
BASOS ABS: 0.2 10*3/uL — AB (ref 0.0–0.1)
EOS%: 3.3 % (ref 0.0–7.0)
Eosinophils Absolute: 0.6 10*3/uL — ABNORMAL HIGH (ref 0.0–0.5)
HEMATOCRIT: 45.5 % (ref 34.8–46.6)
HEMOGLOBIN: 12.6 g/dL (ref 11.6–15.9)
LYMPH#: 1.4 10*3/uL (ref 0.9–3.3)
LYMPH%: 7.6 % — ABNORMAL LOW (ref 14.0–49.7)
MCH: 19.1 pg — ABNORMAL LOW (ref 25.1–34.0)
MCHC: 27.6 g/dL — ABNORMAL LOW (ref 31.5–36.0)
MCV: 69.3 fL — ABNORMAL LOW (ref 79.5–101.0)
MONO#: 0.9 10*3/uL (ref 0.1–0.9)
MONO%: 5.2 % (ref 0.0–14.0)
NEUT#: 14.8 10*3/uL — ABNORMAL HIGH (ref 1.5–6.5)
NEUT%: 82.8 % — ABNORMAL HIGH (ref 38.4–76.8)
Platelets: 1998 10*3/uL — ABNORMAL HIGH (ref 145–400)
RBC: 6.57 10*6/uL — ABNORMAL HIGH (ref 3.70–5.45)
RDW: 23.3 % — AB (ref 11.2–14.5)
WBC: 17.9 10*3/uL — ABNORMAL HIGH (ref 3.9–10.3)

## 2015-12-16 NOTE — Telephone Encounter (Signed)
per pof to sch pt appt-gave pt copy of avs °

## 2015-12-16 NOTE — Progress Notes (Signed)
  [facility] OFFICE PROGRESS NOTE   Diagnosis: Polycythemia vera  INTERVAL HISTORY:   Alejandra [name] returns as scheduled. She is taking hydroxyurea, but she is not taking it on the weekend. She feels hydroxyurea causes urinary frequency. She has diffuse arthritis pain. She reports tolerating the last phlebotomy well. She is concerned that she is losing weight. She reports a good appetite.  Objective:  Vital signs in last 24 hours:  Blood pressure 118/91, pulse 89, temperature 97.9 F (36.6 C), temperature source Oral, resp. rate 18, height 5' 3.5" (1.613 m), weight 116 lb 9.6 oz (52.889 kg), SpO2 100 %.    HEENT: No thrush or ulcers Resp: Lungs clear bilaterally Cardio: Regular rate and rhythm GI: No hepatosplenomegaly Vascular: No leg edema   Lab Results:  Lab Results  Component Value Date   WBC 17.9* 12/16/2015   HGB 12.6 12/16/2015   HCT 45.5 12/16/2015   MCV 69.3* 12/16/2015   PLT 1,998* 12/16/2015   NEUTROABS 14.8* 12/16/2015     Medications: I have reviewed the patient's current medications.  Assessment/Plan: 1. Polycythemia vera on hydroxyurea. Last phlebotomy 09/08/2015 2. Hypertension, followed by Dr. Montez Morita.  3. History of hyperpigmentation at the hands. Question related to hydroxyurea. 4. Hip fracture February 2013. She underwent a bipolar hip arthroplasty on 04/23/2012. 5. Thrombocytosis. Most likely secondary to polycythemia vera and iron deficiency. 6. Hospitalization with left shoulder cellulitis February 2016. 7. Pelvic fracture after a fall 05/31/2015 8. Weight loss-etiology unclear; weight stable Today  Disposition:  Alejandra [name] appears stable. She will take hydroxyurea daily at a dose of 1000 mg with 1500 mg 3 days per week. She will return for a CBC in 3 weeks. We will increase the dose to 1500 mg daily if the hemoglobin is higher in 3 weeks. She will return for an office visit in 6 weeks. She declined phlebotomy  today.  Betsy Coder, MD  12/16/2015  2:03 PM

## 2016-01-06 ENCOUNTER — Other Ambulatory Visit (HOSPITAL_BASED_OUTPATIENT_CLINIC_OR_DEPARTMENT_OTHER): Payer: Medicare HMO

## 2016-01-06 DIAGNOSIS — D45 Polycythemia vera: Secondary | ICD-10-CM

## 2016-01-06 LAB — CBC WITH DIFFERENTIAL/PLATELET
BASO%: 1.5 % (ref 0.0–2.0)
BASOS ABS: 0.3 10*3/uL — AB (ref 0.0–0.1)
EOS%: 5.4 % (ref 0.0–7.0)
Eosinophils Absolute: 1 10*3/uL — ABNORMAL HIGH (ref 0.0–0.5)
HCT: 42.7 % (ref 34.8–46.6)
HGB: 12.4 g/dL (ref 11.6–15.9)
LYMPH%: 5 % — ABNORMAL LOW (ref 14.0–49.7)
MCH: 19.8 pg — ABNORMAL LOW (ref 25.1–34.0)
MCHC: 29 g/dL — ABNORMAL LOW (ref 31.5–36.0)
MCV: 68 fL — AB (ref 79.5–101.0)
MONO#: 0.4 10*3/uL (ref 0.1–0.9)
MONO%: 2.2 % (ref 0.0–14.0)
NEUT#: 16 10*3/uL — ABNORMAL HIGH (ref 1.5–6.5)
NEUT%: 85.9 % — ABNORMAL HIGH (ref 38.4–76.8)
NRBC: 0 % (ref 0–0)
Platelets: 1202 10*3/uL — ABNORMAL HIGH (ref 145–400)
RBC: 6.28 10*6/uL — AB (ref 3.70–5.45)
RDW: 23.1 % — AB (ref 11.2–14.5)
WBC: 18.7 10*3/uL — ABNORMAL HIGH (ref 3.9–10.3)
lymph#: 0.9 10*3/uL (ref 0.9–3.3)

## 2016-01-07 ENCOUNTER — Telehealth: Payer: Self-pay | Admitting: *Deleted

## 2016-01-07 NOTE — Telephone Encounter (Signed)
-----   Message from Ladell Pier, MD sent at 01/07/2016  7:37 AM EDT ----- Please call patient, continue hydrea-same dose, f/u as scheduled

## 2016-01-07 NOTE — Telephone Encounter (Signed)
Per Dr. Benay Spice, pt notified to continue hydrea dose and to f/u as scheduled.  Pt has no questions or concerns at this time.

## 2016-01-19 ENCOUNTER — Telehealth: Payer: Self-pay | Admitting: Nurse Practitioner

## 2016-01-19 NOTE — Telephone Encounter (Signed)
cld & spoke to pt and gave pt time & date of appt on 6/15@11 :15-pt understood

## 2016-01-27 ENCOUNTER — Ambulatory Visit: Payer: Medicare HMO | Admitting: Nurse Practitioner

## 2016-01-27 ENCOUNTER — Telehealth: Payer: Self-pay | Admitting: Nurse Practitioner

## 2016-01-27 ENCOUNTER — Ambulatory Visit (HOSPITAL_BASED_OUTPATIENT_CLINIC_OR_DEPARTMENT_OTHER): Payer: Medicare HMO

## 2016-01-27 ENCOUNTER — Ambulatory Visit (HOSPITAL_BASED_OUTPATIENT_CLINIC_OR_DEPARTMENT_OTHER): Payer: Medicare HMO | Admitting: Nurse Practitioner

## 2016-01-27 VITALS — BP 132/76 | HR 72 | Temp 98.2°F | Resp 18 | Ht 63.5 in | Wt 116.2 lb

## 2016-01-27 DIAGNOSIS — I10 Essential (primary) hypertension: Secondary | ICD-10-CM | POA: Diagnosis not present

## 2016-01-27 DIAGNOSIS — D45 Polycythemia vera: Secondary | ICD-10-CM | POA: Diagnosis not present

## 2016-01-27 LAB — CBC WITH DIFFERENTIAL/PLATELET
BASO%: 1.8 % (ref 0.0–2.0)
Basophils Absolute: 0.2 10*3/uL — ABNORMAL HIGH (ref 0.0–0.1)
EOS ABS: 0.5 10*3/uL (ref 0.0–0.5)
EOS%: 3.8 % (ref 0.0–7.0)
HEMATOCRIT: 42.8 % (ref 34.8–46.6)
HEMOGLOBIN: 12.3 g/dL (ref 11.6–15.9)
LYMPH#: 1.1 10*3/uL (ref 0.9–3.3)
LYMPH%: 7.9 % — ABNORMAL LOW (ref 14.0–49.7)
MCH: 19.7 pg — ABNORMAL LOW (ref 25.1–34.0)
MCHC: 28.7 g/dL — ABNORMAL LOW (ref 31.5–36.0)
MCV: 68.7 fL — AB (ref 79.5–101.0)
MONO#: 0.5 10*3/uL (ref 0.1–0.9)
MONO%: 3.6 % (ref 0.0–14.0)
NEUT%: 82.9 % — ABNORMAL HIGH (ref 38.4–76.8)
NEUTROS ABS: 11.5 10*3/uL — AB (ref 1.5–6.5)
Platelets: 1770 10*3/uL — ABNORMAL HIGH (ref 145–400)
RBC: 6.22 10*6/uL — ABNORMAL HIGH (ref 3.70–5.45)
RDW: 24.2 % — ABNORMAL HIGH (ref 11.2–14.5)
WBC: 13.8 10*3/uL — ABNORMAL HIGH (ref 3.9–10.3)

## 2016-01-27 NOTE — Telephone Encounter (Signed)
per pof to sch pt appt-gave pt copy of avs °

## 2016-01-27 NOTE — Progress Notes (Signed)
  [place] OFFICE PROGRESS NOTE   Diagnosis:  Polycythemia vera  INTERVAL HISTORY:   Alejandra [name] returns as scheduled. She continues hydroxyurea, current dose 1500 mg 3 days a week and 1000 mg other days. She denies nausea/vomiting. No mouth sores. No diarrhea. No rash. Arthritis pain is unchanged. She reports a good appetite.  Objective:  Vital signs in last 24 hours:  Blood pressure 132/76, pulse 72, temperature 98.2 F (36.8 C), temperature source Oral, resp. rate 18, height 5' 3.5" (1.613 m), weight 116 lb 3.2 oz (52.708 kg), SpO2 99 %.    HEENT: No thrush or ulcers. Resp: Lungs clear bilaterally. Cardio: Regular rate and rhythm. GI: Abdomen soft and nontender. No organomegaly. Vascular: No leg edema.   Lab Results:  Lab Results  Component Value Date   WBC 13.8* 01/27/2016   HGB 12.3 01/27/2016   HCT 42.8 01/27/2016   MCV 68.7* 01/27/2016   PLT 1,770* 01/27/2016   NEUTROABS 11.5* 01/27/2016    Imaging:  No results found.  Medications: I have reviewed the patient's current medications.  Assessment/Plan: 1. Polycythemia vera on hydroxyurea. Last phlebotomy 09/08/2015 2. Hypertension, followed by Dr. Montez Morita.  3. History of hyperpigmentation at the hands. Question related to hydroxyurea. 4. Hip fracture February 2013. She underwent a bipolar hip arthroplasty on 04/23/2012. 5. Thrombocytosis. Most likely secondary to polycythemia vera and iron deficiency. 6. Hospitalization with left shoulder cellulitis February 2016. 7. Pelvic fracture after a fall 05/31/2015 8. Weight loss-etiology unclear; weight stable 05/04/2017And 01/27/2016   Disposition: Alejandra [name] appears stable. She unfortunately did not have a CBC prior to today's visit. She will return to the lab prior to leaving the office. We will contact her with instructions regarding Hydrea. She will return in 3 weeks for CBC in 6 weeks for a follow-up visit.    Ned Card ANP/GNP-BC    01/27/2016  4:36 PM

## 2016-01-31 ENCOUNTER — Telehealth: Payer: Self-pay | Admitting: *Deleted

## 2016-01-31 NOTE — Telephone Encounter (Signed)
Spoke with pt and instructed pt re:  Per Lattie Haw, NP  -  Increased  Hydrea to  1500 mg  M - F ,  And  1000 mg  Sat and  Sunday.  Pt voiced understanding and aware of next lab appt.

## 2016-02-17 ENCOUNTER — Other Ambulatory Visit (HOSPITAL_BASED_OUTPATIENT_CLINIC_OR_DEPARTMENT_OTHER): Payer: Medicare HMO

## 2016-02-17 DIAGNOSIS — D45 Polycythemia vera: Secondary | ICD-10-CM | POA: Diagnosis not present

## 2016-02-17 LAB — CBC WITH DIFFERENTIAL/PLATELET
BASO%: 1 % (ref 0.0–2.0)
Basophils Absolute: 0.2 10*3/uL — ABNORMAL HIGH (ref 0.0–0.1)
EOS%: 3.7 % (ref 0.0–7.0)
Eosinophils Absolute: 0.5 10*3/uL (ref 0.0–0.5)
HCT: 45.3 % (ref 34.8–46.6)
HGB: 13.3 g/dL (ref 11.6–15.9)
LYMPH%: 6.4 % — AB (ref 14.0–49.7)
MCH: 20.4 pg — ABNORMAL LOW (ref 25.1–34.0)
MCHC: 29.4 g/dL — ABNORMAL LOW (ref 31.5–36.0)
MCV: 69.6 fL — ABNORMAL LOW (ref 79.5–101.0)
MONO#: 0.4 10*3/uL (ref 0.1–0.9)
MONO%: 2.9 % (ref 0.0–14.0)
NEUT%: 86 % — ABNORMAL HIGH (ref 38.4–76.8)
NEUTROS ABS: 12.5 10*3/uL — AB (ref 1.5–6.5)
RBC: 6.51 10*6/uL — AB (ref 3.70–5.45)
RDW: 25.2 % — ABNORMAL HIGH (ref 11.2–14.5)
WBC: 14.5 10*3/uL — AB (ref 3.9–10.3)
lymph#: 0.9 10*3/uL (ref 0.9–3.3)

## 2016-03-09 ENCOUNTER — Ambulatory Visit (HOSPITAL_BASED_OUTPATIENT_CLINIC_OR_DEPARTMENT_OTHER): Payer: Medicare HMO | Admitting: Oncology

## 2016-03-09 ENCOUNTER — Telehealth: Payer: Self-pay | Admitting: Oncology

## 2016-03-09 ENCOUNTER — Other Ambulatory Visit (HOSPITAL_BASED_OUTPATIENT_CLINIC_OR_DEPARTMENT_OTHER): Payer: Medicare HMO

## 2016-03-09 VITALS — BP 140/86 | HR 69 | Temp 98.0°F | Resp 18 | Ht 63.5 in | Wt 115.2 lb

## 2016-03-09 DIAGNOSIS — D45 Polycythemia vera: Secondary | ICD-10-CM | POA: Diagnosis not present

## 2016-03-09 LAB — CBC WITH DIFFERENTIAL/PLATELET
BASO%: 1.4 % (ref 0.0–2.0)
BASOS ABS: 0.2 10*3/uL — AB (ref 0.0–0.1)
EOS ABS: 0.6 10*3/uL — AB (ref 0.0–0.5)
EOS%: 3.9 % (ref 0.0–7.0)
HCT: 41.6 % (ref 34.8–46.6)
HEMOGLOBIN: 12.2 g/dL (ref 11.6–15.9)
LYMPH%: 8 % — AB (ref 14.0–49.7)
MCH: 20.6 pg — AB (ref 25.1–34.0)
MCHC: 29.4 g/dL — AB (ref 31.5–36.0)
MCV: 70.1 fL — AB (ref 79.5–101.0)
MONO#: 0.7 10*3/uL (ref 0.1–0.9)
MONO%: 4.4 % (ref 0.0–14.0)
NEUT#: 13.2 10*3/uL — ABNORMAL HIGH (ref 1.5–6.5)
NEUT%: 82.3 % — AB (ref 38.4–76.8)
Platelets: 1737 10*3/uL — ABNORMAL HIGH (ref 145–400)
RBC: 5.94 10*6/uL — AB (ref 3.70–5.45)
RDW: 26 % — AB (ref 11.2–14.5)
WBC: 16.1 10*3/uL — ABNORMAL HIGH (ref 3.9–10.3)
lymph#: 1.3 10*3/uL (ref 0.9–3.3)

## 2016-03-09 NOTE — Telephone Encounter (Signed)
per pof to sch pt appt-gave pt copy of avs °

## 2016-03-09 NOTE — Progress Notes (Signed)
  [place] OFFICE PROGRESS NOTE   Diagnosis: Polycythemia vera  INTERVAL HISTORY:   [name] returns as scheduled. She has an ulcer at the right inner lip. This is healing.. She continues hydroxyurea. No symptom of venous or arterial thrombosis.  Objective:  Vital signs in last 24 hours:  Blood pressure 140/86, pulse 69, temperature 98 F (36.7 C), temperature source Oral, resp. rate 18, height 5' 3.5" (1.613 m), weight 115 lb 3.2 oz (52.3 kg), SpO2 100 %.    HEENT: 3-4 mm ulcer at the right lower inner lip, no thrush Resp: Lungs clear bilaterally Cardio: Regular rate and rhythm GI: No hepatosplenomegaly Vascular: No leg edema   Lab Results:  Lab Results  Component Value Date   WBC 16.1 (H) 03/09/2016   HGB 12.2 03/09/2016   HCT 41.6 03/09/2016   MCV 70.1 (L) 03/09/2016   PLT 1,737 (H) 03/09/2016   NEUTROABS 13.2 (H) 03/09/2016    Medications: I have reviewed the patient's current medications.  Assessment/Plan: 1. Polycythemia vera on hydroxyurea. Last phlebotomy 09/08/2015 2. Hypertension, followed by Dr. Montez Morita.  3. History of hyperpigmentation at the hands. Question related to hydroxyurea. 4. Hip fracture February 2013. She underwent a bipolar hip arthroplasty on 04/23/2012. 5. Thrombocytosis. Most likely secondary to polycythemia vera and iron deficiency. 6. Hospitalization with left shoulder cellulitis February 2016. 7. Pelvic fracture after a fall 05/31/2015 8. Weight loss-etiology unclear; weight stable 05/04/2017And 01/27/2016 9. Right anterior buccal ulcer 03/09/2016    Disposition:  She appears stable. The hemoglobin/hematocrit are borderline high today and the platelet count remains markedly elevated. We decided to increase the hydroxyurea dose to 1500 mg Monday through Friday and 1000 mg on Saturday/Sunday. She will return for a CBC in one month and an office visit in 2 months.  Betsy Coder, MD  03/09/2016  9:23 AM

## 2016-03-28 ENCOUNTER — Ambulatory Visit (HOSPITAL_COMMUNITY)
Admission: EM | Admit: 2016-03-28 | Discharge: 2016-03-28 | Disposition: A | Discharge status: Home / Self Care | Payer: 59 | Attending: Family Medicine | Admitting: Family Medicine

## 2016-03-28 ENCOUNTER — Encounter (HOSPITAL_COMMUNITY): Payer: Self-pay | Admitting: Family Medicine

## 2016-03-28 DIAGNOSIS — M25551 Pain in right hip: Secondary | ICD-10-CM

## 2016-03-28 DIAGNOSIS — M1611 Unilateral primary osteoarthritis, right hip: Secondary | ICD-10-CM | POA: Diagnosis not present

## 2016-03-28 MED ORDER — NAPROXEN 250 MG PO TABS: ORAL_TABLET | ORAL | 0 refills | Status: DC

## 2016-03-28 NOTE — ED Provider Notes (Signed)
CSN: [id number]     Arrival date & time 03/28/16  1552 History   First MD Initiated Contact with Patient 03/28/16 1707     Chief Complaint  Patient presents with  . Hip Pain   (Consider location/radiation/quality/duration/timing/severity/associated sxs/prior Treatment) 75 year old female complaining of right lower back and hip pain. She has had chronic hip pain for several years. She has a history of osteoarthritis, avascular necrosis and hip surgery replacement. Approximately 3 weeks ago she was placed on a prednisone taper pack. That seemed to help significantly. She also had been taking naproxen. She states she is now out of naproxen and she has finished her prednisone course. She also has tramadol at home. She takes 100 mg every 12 hours when necessary. She states that medicine helps her pain. She did not take that medicine this morning because she was having too much pain. She is here today because she is having pain but unclear as to what she needs from the Urgent Care.  It was finally agreed that she needed another prescription for naproxen.      Past Medical History:  Diagnosis Date  . Arthritis   . Avascular necrosis of femur head, right ([place]) 04/23/2012  . Cellulitis of shoulder 09/28/2014  . Fracture 10/05/11   "fell and broke right hip"  . Fracture of femoral neck, right ([place]) 10/12/2011  . Glaucoma   . Glaucoma    bilateral  . Hypertension   . Polycythemia vera(238.4)   . Rhabdomyolysis 09/28/2014  . Thrombocytosis ([place]) 04/23/2012   Past Surgical History:  Procedure Laterality Date  . COLONOSCOPY    . HARDWARE REMOVAL  04/23/2012   Procedure: HARDWARE REMOVAL;  Surgeon: Johnny Bridge, MD;  Location: [facility];  Service: Orthopedics;  Laterality: Right;  . HIP ARTHROPLASTY  04/23/2012   Procedure: ARTHROPLASTY BIPOLAR HIP;  Surgeon: Johnny Bridge, MD;  Location: [facility];  Service: Orthopedics;  Laterality: Right;  . HIP PINNING,CANNULATED  10/12/2011   Procedure: CANNULATED  HIP PINNING;  Surgeon: Johnny Bridge, MD;  Location: [place] [facility];  Service: Orthopedics;  Laterality: Right;   History reviewed. No pertinent family history. Social History  Substance Use Topics  . Smoking status: Former Smoker    Packs/day: 0.12    Types: Cigarettes    Quit date: 08/15/1991  . Smokeless tobacco: Never Used  . Alcohol use No     Comment: 10/12/11 "did drink in my younger; don't drink at all now"   OB History    No data available     Review of Systems  Constitutional: Positive for activity change. Negative for fever.  HENT: Negative.   Respiratory: Negative for cough and shortness of breath.   Cardiovascular: Negative for chest pain.  Gastrointestinal: Negative.   Genitourinary: Negative.   Musculoskeletal: Positive for arthralgias and gait problem. Negative for neck pain and neck stiffness.  Skin: Negative.   All other systems reviewed and are negative.   Allergies  Review of patient's allergies indicates no known allergies.  Home Medications   Prior to Admission medications   Medication Sig Start Date End Date Taking? Authorizing Provider  ALPRAZolam (XANAX) 0.25 MG tablet Take 0.25 mg by mouth 2 (two) times daily as needed for anxiety.     Historical Provider, MD  amLODipine (NORVASC) 10 MG tablet Take 10 mg by mouth daily.    Historical Provider, MD  aspirin EC 81 MG tablet Take 81 mg by mouth daily.    Historical Provider, MD  atorvastatin (  LIPITOR) 10 MG tablet Take 10 mg by mouth daily. 07/12/15   Historical Provider, MD  baclofen (LIORESAL) 10 MG tablet Take 10 mg by mouth 3 (three) times daily. 12/13/15   Historical Provider, MD  benazepril (LOTENSIN) 40 MG tablet Take 40 mg by mouth daily.    Historical Provider, MD  carvedilol (COREG) 3.125 MG tablet Take 3.125 mg by mouth daily.  11/18/13   Historical Provider, MD  dorzolamide-timolol (COSOPT) 22.3-6.8 MG/ML ophthalmic solution Place 1 drop into both eyes 2 (two) times daily.    Historical Provider, MD   hydroxyurea (HYDREA) 500 MG capsule Take 1,000-1,500 mg by mouth daily. 1500mg  on Monday, Tuesday, Wednesday, Thursday and Friday. 1000 mg on Saturday and Sunday    Historical Provider, MD  loratadine (CLARITIN) 10 MG tablet Take 10 mg by mouth daily as needed for allergies.    Historical Provider, MD  meclizine (ANTIVERT) 25 MG tablet Take 25 mg by mouth 3 (three) times daily as needed for dizziness.  03/25/14   Historical Provider, MD  naproxen (NAPROSYN) 250 MG tablet One tab po bid with meals 03/28/16   Janne Napoleon, NP  [facility] 1-0.2 % SUSP  12/14/15   Historical Provider, MD  traMADol (ULTRAM) 50 MG tablet Take 100 mg by mouth every 12 (twelve) hours as needed for severe pain.  10/13/14   Historical Provider, MD  TRAVATAN Z 0.004 % SOLN ophthalmic solution Place 1 drop into both eyes at bedtime. 05/26/15   Historical Provider, MD   Meds Ordered and Administered this Visit  Medications - No data to display  BP 128/93 (BP Location: Left Arm)   Pulse 71   Temp 98.2 F (36.8 C) (Oral)   Resp 16   SpO2 96%  No data found.   Physical Exam  Constitutional: She is oriented to person, place, and time. She appears well-developed and well-nourished. No distress.  Eyes: EOM are normal.  Neck: Neck supple.  Cardiovascular: Normal rate.   Pulmonary/Chest: Effort normal. No respiratory distress.  Musculoskeletal: She exhibits tenderness. She exhibits no edema or deformity.  Neurological: She is alert and oriented to person, place, and time. She exhibits normal muscle tone.  Skin: Skin is warm and dry.  Psychiatric: She has a normal mood and affect.  Nursing note and vitals reviewed.   Urgent Care Course   Clinical Course    Procedures (including critical care time)  Labs Review Labs Reviewed - No data to display  Imaging Review No results found.   Visual Acuity Review  Right Eye Distance:   Left Eye Distance:   Bilateral Distance:    Right Eye Near:   Left Eye Near:     Bilateral Near:         MDM   1. Right hip pain   2. Primary osteoarthritis of right hip    F/U with your PCP for pain meds Meds ordered this encounter  Medications  . naproxen (NAPROSYN) 250 MG tablet    Sig: One tab po bid with meals    Dispense:  20 tablet    Refill:  0    Order Specific Question:   Supervising Provider    Answer:   Billy Fischer [[id number]]       Janne Napoleon, NP 03/28/16 1730

## 2016-03-28 NOTE — ED Triage Notes (Signed)
Pt here for chronic right hip pain that has gotten worse over the past few month. sts that she has had multiple injection in hip and told it was severe OA. sts the pain this am made her nauseous. sts that she has naproxen that she takes sometimes for the pain.

## 2016-05-08 ENCOUNTER — Other Ambulatory Visit (HOSPITAL_COMMUNITY): Payer: Self-pay | Admitting: Pulmonary Disease

## 2016-05-08 DIAGNOSIS — I999 Unspecified disorder of circulatory system: Secondary | ICD-10-CM

## 2016-05-10 ENCOUNTER — Telehealth: Payer: Self-pay | Admitting: Oncology

## 2016-05-10 ENCOUNTER — Other Ambulatory Visit (HOSPITAL_BASED_OUTPATIENT_CLINIC_OR_DEPARTMENT_OTHER): Payer: 59

## 2016-05-10 ENCOUNTER — Ambulatory Visit (HOSPITAL_BASED_OUTPATIENT_CLINIC_OR_DEPARTMENT_OTHER): Payer: 59 | Admitting: Nurse Practitioner

## 2016-05-10 VITALS — BP 153/72 | HR 68 | Temp 98.3°F | Resp 18 | Ht 63.5 in | Wt 112.9 lb

## 2016-05-10 DIAGNOSIS — D45 Polycythemia vera: Secondary | ICD-10-CM | POA: Diagnosis not present

## 2016-05-10 LAB — CBC WITH DIFFERENTIAL/PLATELET
BASO%: 1.8 % (ref 0.0–2.0)
BASOS ABS: 0.3 10*3/uL — AB (ref 0.0–0.1)
EOS%: 4 % (ref 0.0–7.0)
Eosinophils Absolute: 0.7 10*3/uL — ABNORMAL HIGH (ref 0.0–0.5)
HEMATOCRIT: 45 % (ref 34.8–46.6)
HGB: 13 g/dL (ref 11.6–15.9)
LYMPH#: 1.4 10*3/uL (ref 0.9–3.3)
LYMPH%: 8.5 % — ABNORMAL LOW (ref 14.0–49.7)
MCH: 20.6 pg — AB (ref 25.1–34.0)
MCHC: 28.9 g/dL — AB (ref 31.5–36.0)
MCV: 71.4 fL — AB (ref 79.5–101.0)
MONO#: 1 10*3/uL — ABNORMAL HIGH (ref 0.1–0.9)
MONO%: 6.1 % (ref 0.0–14.0)
NEUT#: 13.1 10*3/uL — ABNORMAL HIGH (ref 1.5–6.5)
NEUT%: 79.6 % — AB (ref 38.4–76.8)
RBC: 6.3 10*6/uL — ABNORMAL HIGH (ref 3.70–5.45)
RDW: 23.7 % — ABNORMAL HIGH (ref 11.2–14.5)
WBC: 16.4 10*3/uL — ABNORMAL HIGH (ref 3.9–10.3)

## 2016-05-10 NOTE — Telephone Encounter (Signed)
Avs report and appointment schedule given to patient, per 05/10/16 los. °

## 2016-05-10 NOTE — Progress Notes (Signed)
  [place] OFFICE PROGRESS NOTE   Diagnosis:  Polycythemia vera  INTERVAL HISTORY:   Alejandra [name] returns as scheduled. She continues hydroxyurea. The hydroxyurea dose was increased to 1500 mg Monday through Friday and 1000 mg on Saturday/Sunday following office visit 03/09/2016.  She reports good compliance with the increased dose of Hydrea since her last office visit. No nausea or vomiting. No mouth sores. No diarrhea. No rash. She continues to have pain at the right hip. She attributes the pain to arthritis. No symptom of venous or arterial thrombosis.  Objective:  Vital signs in last 24 hours:  Blood pressure (!) 153/72, pulse 68, temperature 98.3 F (36.8 C), temperature source Oral, resp. rate 18, height 5' 3.5" (1.613 m), weight 112 lb 14.4 oz (51.2 kg), SpO2 100 %.    HEENT: No thrush or ulcers. Resp: Lungs clear bilaterally. Cardio: Regular rate and rhythm. GI: Abdomen soft and nontender. No organomegaly. Vascular: No leg edema.  Lab Results:  Lab Results  Component Value Date   WBC 16.4 (H) 05/10/2016   HGB 13.0 05/10/2016   HCT 45.0 05/10/2016   MCV 71.4 (L) 05/10/2016   PLT 1,986 (H) 05/10/2016   NEUTROABS 13.1 (H) 05/10/2016    Imaging:  No results found.  Medications: I have reviewed the patient's current medications.  Assessment/Plan: 1. Polycythemia vera on hydroxyurea. Last phlebotomy 09/08/2015 2. Hypertension, followed by Dr. Montez Morita.  3. History of hyperpigmentation at the hands. Question related to hydroxyurea. 4. Hip fracture February 2013. She underwent a bipolar hip arthroplasty on 04/23/2012. 5. Thrombocytosis. Most likely secondary to polycythemia vera and iron deficiency. 6. Hospitalization with left shoulder cellulitis February 2016. 7. Pelvic fracture after a fall 05/31/2015 8. Weight loss-etiology unclear; weight stable 05/04/2017And 01/27/2016 9. Right anterior buccal ulcer 03/09/2016   Disposition: Alejandra [name]  appears stable. The hemoglobin/hematocrit and platelet count remain elevated despite the Hydrea dose increase 2 months ago. I reviewed today's labs with Dr. Benay Spice. She was given instructions to increase the Hydrea to 1500 mg daily. She will return for a follow-up CBC in 3 weeks. She will return for labs and a follow-up visit in 6 weeks.  Plan reviewed with Dr. Benay Spice.    Ned Card ANP/GNP-BC   05/10/2016  10:01 AM

## 2016-05-15 ENCOUNTER — Ambulatory Visit (HOSPITAL_COMMUNITY)
Admission: RE | Admit: 2016-05-15 | Discharge: 2016-05-15 | Disposition: A | Discharge status: Home / Self Care | Payer: Medicare HMO | Source: Ambulatory Visit | Attending: Pulmonary Disease | Admitting: Pulmonary Disease

## 2016-05-15 DIAGNOSIS — I999 Unspecified disorder of circulatory system: Secondary | ICD-10-CM | POA: Insufficient documentation

## 2016-05-15 NOTE — Progress Notes (Signed)
VASCULAR LAB PRELIMINARY  ARTERIAL  ABI completed:ABIs are within normal limits, at rest.    RIGHT    LEFT    PRESSURE WAVEFORM  PRESSURE WAVEFORM  BRACHIAL 139 T BRACHIAL 134 T  DP   DP    AT 140 B AT 131 B  PT 168 B PT 146 B  PER   PER    GREAT TOE  NA GREAT TOE  NA    RIGHT LEFT  ABI 1.1 1.05     Alejandra [name], RVT 05/15/2016, 11:33 AM

## 2016-05-31 ENCOUNTER — Other Ambulatory Visit (HOSPITAL_BASED_OUTPATIENT_CLINIC_OR_DEPARTMENT_OTHER): Payer: 59

## 2016-05-31 DIAGNOSIS — D45 Polycythemia vera: Secondary | ICD-10-CM | POA: Diagnosis not present

## 2016-05-31 LAB — CBC WITH DIFFERENTIAL/PLATELET
BASO%: 3.2 % — ABNORMAL HIGH (ref 0.0–2.0)
BASOS ABS: 0.2 10*3/uL — AB (ref 0.0–0.1)
EOS ABS: 0.4 10*3/uL (ref 0.0–0.5)
EOS%: 6.6 % (ref 0.0–7.0)
HCT: 43.4 % (ref 34.8–46.6)
HEMOGLOBIN: 12.7 g/dL (ref 11.6–15.9)
LYMPH%: 14.9 % (ref 14.0–49.7)
MCH: 21.2 pg — AB (ref 25.1–34.0)
MCHC: 29.2 g/dL — ABNORMAL LOW (ref 31.5–36.0)
MCV: 72.8 fL — AB (ref 79.5–101.0)
MONO#: 0.2 10*3/uL (ref 0.1–0.9)
MONO%: 3.1 % (ref 0.0–14.0)
NEUT%: 72.2 % (ref 38.4–76.8)
NEUTROS ABS: 4.5 10*3/uL (ref 1.5–6.5)
RBC: 5.96 10*6/uL — ABNORMAL HIGH (ref 3.70–5.45)
RDW: 24.4 % — AB (ref 11.2–14.5)
WBC: 6.3 10*3/uL (ref 3.9–10.3)
lymph#: 0.9 10*3/uL (ref 0.9–3.3)

## 2016-06-19 ENCOUNTER — Telehealth: Payer: Self-pay | Admitting: *Deleted

## 2016-06-19 NOTE — Telephone Encounter (Signed)
"  I have an appointment and need to know what time to be here."  Appointment information provided asked that she arrive early to register.

## 2016-06-21 ENCOUNTER — Other Ambulatory Visit: Payer: 59

## 2016-06-21 ENCOUNTER — Ambulatory Visit: Payer: 59 | Admitting: Oncology

## 2016-06-26 ENCOUNTER — Other Ambulatory Visit (HOSPITAL_BASED_OUTPATIENT_CLINIC_OR_DEPARTMENT_OTHER): Payer: Medicare HMO

## 2016-06-26 ENCOUNTER — Telehealth: Payer: Self-pay | Admitting: Oncology

## 2016-06-26 ENCOUNTER — Ambulatory Visit (HOSPITAL_BASED_OUTPATIENT_CLINIC_OR_DEPARTMENT_OTHER): Payer: Medicare HMO | Admitting: Oncology

## 2016-06-26 VITALS — BP 119/70 | HR 85 | Temp 98.0°F | Resp 18 | Ht 63.5 in | Wt 116.4 lb

## 2016-06-26 DIAGNOSIS — D45 Polycythemia vera: Secondary | ICD-10-CM | POA: Diagnosis not present

## 2016-06-26 DIAGNOSIS — I10 Essential (primary) hypertension: Secondary | ICD-10-CM

## 2016-06-26 LAB — CBC WITH DIFFERENTIAL/PLATELET
BASO%: 1.3 % (ref 0.0–2.0)
BASOS ABS: 0.2 10*3/uL — AB (ref 0.0–0.1)
EOS ABS: 0.3 10*3/uL (ref 0.0–0.5)
EOS%: 2.2 % (ref 0.0–7.0)
HEMATOCRIT: 45.4 % (ref 34.8–46.6)
HEMOGLOBIN: 13.3 g/dL (ref 11.6–15.9)
LYMPH#: 0.9 10*3/uL (ref 0.9–3.3)
LYMPH%: 6.3 % — ABNORMAL LOW (ref 14.0–49.7)
MCH: 22.5 pg — AB (ref 25.1–34.0)
MCHC: 29.3 g/dL — ABNORMAL LOW (ref 31.5–36.0)
MCV: 76.8 fL — ABNORMAL LOW (ref 79.5–101.0)
MONO#: 0.7 10*3/uL (ref 0.1–0.9)
MONO%: 4.8 % (ref 0.0–14.0)
NEUT#: 12.7 10*3/uL — ABNORMAL HIGH (ref 1.5–6.5)
NEUT%: 85.4 % — ABNORMAL HIGH (ref 38.4–76.8)
Platelets: 1528 10*3/uL — ABNORMAL HIGH (ref 145–400)
RBC: 5.91 10*6/uL — ABNORMAL HIGH (ref 3.70–5.45)
RDW: 31.5 % — AB (ref 11.2–14.5)
WBC: 14.9 10*3/uL — ABNORMAL HIGH (ref 3.9–10.3)

## 2016-06-26 NOTE — Progress Notes (Signed)
  [place] OFFICE PROGRESS NOTE   Diagnosis: Polycythemia vera  INTERVAL HISTORY:   [name] returns as scheduled. She continues hydroxyurea. No symptom of thrombosis. No mouth sores. No new complaint.  Objective:  Vital signs in last 24 hours:  Blood pressure 119/70, pulse 85, temperature 98 F (36.7 C), temperature source Oral, resp. rate 18, height 5' 3.5" (1.613 m), weight 116 lb 6.4 oz (52.8 kg), SpO2 100 %.    HEENT: No thrush or ulcers Resp: Lungs clear bilaterally, distant breath sounds, no respiratory distress Cardio: Regular rate and rhythm GI: No hepatosplenomegaly Vascular: No leg edema   Lab Results:  Lab Results  Component Value Date   WBC 14.9 (H) 06/26/2016   HGB 13.3 06/26/2016   HCT 45.4 06/26/2016   MCV 76.8 (L) 06/26/2016   PLT 1,528 (H) 06/26/2016   NEUTROABS 12.7 (H) 06/26/2016     Medications: I have reviewed the patient's current medications.  Assessment/Plan: 1. Polycythemia vera on hydroxyurea. Last phlebotomy 09/08/2015 2. Hypertension, followed by Dr. Montez Morita.  3. History of hyperpigmentation at the hands. Question related to hydroxyurea. 4. Hip fracture February 2013. She underwent a bipolar hip arthroplasty on 04/23/2012. 5. Thrombocytosis. Most likely secondary to polycythemia vera and iron deficiency. 6. Hospitalization with left shoulder cellulitis February 2016. 7. Pelvic fracture after a fall 05/31/2015 8. Weight loss-etiology unclear; weight stable 05/04/2017And 01/27/2016 9. Right anterior buccal ulcer 03/09/2016   Disposition:  [name] appears stable. We increase the hydroxyurea dose to 1500 mg daily. She will return for a CBC in 3 weeks and an office visit in 6 weeks. We will plan for phlebotomy therapy if the hemoglobin/hematocrit are not lower over the next month. We may also consider platelet lowering therapy with anagrelide.  Betsy Coder, MD  06/26/2016  12:41 PM

## 2016-06-26 NOTE — Telephone Encounter (Signed)
Appointments scheduled per 06/26/16 los. AVS report and appointment schedule given to patient per 06/26/16 los.  °

## 2016-06-28 ENCOUNTER — Other Ambulatory Visit: Payer: Self-pay | Admitting: Pulmonary Disease

## 2016-06-28 DIAGNOSIS — Z1231 Encounter for screening mammogram for malignant neoplasm of breast: Secondary | ICD-10-CM

## 2016-07-17 ENCOUNTER — Other Ambulatory Visit (HOSPITAL_BASED_OUTPATIENT_CLINIC_OR_DEPARTMENT_OTHER): Payer: Medicare HMO

## 2016-07-17 DIAGNOSIS — D45 Polycythemia vera: Secondary | ICD-10-CM

## 2016-07-17 LAB — CBC WITH DIFFERENTIAL/PLATELET
BASO%: 1.7 % (ref 0.0–2.0)
BASOS ABS: 0.2 10*3/uL — AB (ref 0.0–0.1)
EOS ABS: 0.4 10*3/uL (ref 0.0–0.5)
EOS%: 3.7 % (ref 0.0–7.0)
HEMATOCRIT: 42.8 % (ref 34.8–46.6)
HEMOGLOBIN: 13 g/dL (ref 11.6–15.9)
LYMPH#: 1.2 10*3/uL (ref 0.9–3.3)
LYMPH%: 11.1 % — ABNORMAL LOW (ref 14.0–49.7)
MCH: 23.4 pg — AB (ref 25.1–34.0)
MCHC: 30.4 g/dL — ABNORMAL LOW (ref 31.5–36.0)
MCV: 77 fL — AB (ref 79.5–101.0)
MONO#: 0.6 10*3/uL (ref 0.1–0.9)
MONO%: 5.8 % (ref 0.0–14.0)
NEUT#: 8.6 10*3/uL — ABNORMAL HIGH (ref 1.5–6.5)
NEUT%: 77.7 % — AB (ref 38.4–76.8)
RBC: 5.56 10*6/uL — ABNORMAL HIGH (ref 3.70–5.45)
RDW: 29.3 % — ABNORMAL HIGH (ref 11.2–14.5)
WBC: 11 10*3/uL — ABNORMAL HIGH (ref 3.9–10.3)
nRBC: 0 % (ref 0–0)

## 2016-07-18 ENCOUNTER — Telehealth: Payer: Self-pay | Admitting: *Deleted

## 2016-07-18 ENCOUNTER — Telehealth: Payer: Self-pay

## 2016-07-18 NOTE — Telephone Encounter (Signed)
-----   Message from Ladell Pier, MD sent at 07/17/2016  8:04 PM EST ----- Please call patient, same hydroxyurea, f/u as scheduled

## 2016-07-18 NOTE — Telephone Encounter (Signed)
Return call received from patient.  Patient notified per Dr. Benay Spice to continue same dose of hydroxyurea and to f/u as scheduled.  Patient has no questions or concerns at this time.

## 2016-07-18 NOTE — Telephone Encounter (Signed)
Left message for pt to call back  °

## 2016-08-08 ENCOUNTER — Ambulatory Visit (HOSPITAL_BASED_OUTPATIENT_CLINIC_OR_DEPARTMENT_OTHER): Payer: Medicare HMO | Admitting: Nurse Practitioner

## 2016-08-08 ENCOUNTER — Other Ambulatory Visit (HOSPITAL_BASED_OUTPATIENT_CLINIC_OR_DEPARTMENT_OTHER): Payer: Medicare HMO

## 2016-08-08 ENCOUNTER — Telehealth: Payer: Self-pay | Admitting: Nurse Practitioner

## 2016-08-08 VITALS — BP 165/77 | HR 71 | Temp 98.2°F | Resp 18 | Wt 114.3 lb

## 2016-08-08 DIAGNOSIS — D473 Essential (hemorrhagic) thrombocythemia: Secondary | ICD-10-CM

## 2016-08-08 DIAGNOSIS — D45 Polycythemia vera: Secondary | ICD-10-CM | POA: Diagnosis not present

## 2016-08-08 DIAGNOSIS — I10 Essential (primary) hypertension: Secondary | ICD-10-CM

## 2016-08-08 DIAGNOSIS — D75839 Thrombocytosis, unspecified: Secondary | ICD-10-CM

## 2016-08-08 LAB — CBC WITH DIFFERENTIAL/PLATELET
BASO%: 1.3 % (ref 0.0–2.0)
BASOS ABS: 0.1 10*3/uL (ref 0.0–0.1)
EOS%: 4.6 % (ref 0.0–7.0)
Eosinophils Absolute: 0.5 10*3/uL (ref 0.0–0.5)
HEMATOCRIT: 44.8 % (ref 34.8–46.6)
HGB: 14.1 g/dL (ref 11.6–15.9)
LYMPH%: 12.3 % — AB (ref 14.0–49.7)
MCH: 24.7 pg — AB (ref 25.1–34.0)
MCHC: 31.4 g/dL — AB (ref 31.5–36.0)
MCV: 78.6 fL — AB (ref 79.5–101.0)
MONO#: 0.8 10*3/uL (ref 0.1–0.9)
MONO%: 8.1 % (ref 0.0–14.0)
NEUT#: 7.5 10*3/uL — ABNORMAL HIGH (ref 1.5–6.5)
NEUT%: 73.7 % (ref 38.4–76.8)
RBC: 5.7 10*6/uL — ABNORMAL HIGH (ref 3.70–5.45)
RDW: 30.6 % — ABNORMAL HIGH (ref 11.2–14.5)
WBC: 10.2 10*3/uL (ref 3.9–10.3)
lymph#: 1.3 10*3/uL (ref 0.9–3.3)
nRBC: 0 % (ref 0–0)

## 2016-08-08 NOTE — Telephone Encounter (Signed)
Appointments scheduled per 12/26 LOS. Patient given AVS report and calendars with future scheduled appointments. °

## 2016-08-08 NOTE — Progress Notes (Signed)
  [place] OFFICE PROGRESS NOTE   Diagnosis:  Polycythemia vera  INTERVAL HISTORY:   Alejandra [name] returns as scheduled. She continues hydroxyurea 1500 mg daily. No symptoms of thrombosis. No bleeding. She has recently noted a sore at the tip of her tongue. No nausea or vomiting. No rash.  Objective:  Vital signs in last 24 hours:  Blood pressure (!) 165/77, pulse 71, temperature 98.2 F (36.8 C), temperature source Oral, resp. rate 18, weight 114 lb 4.8 oz (51.8 kg), SpO2 100 %.    HEENT: Small ulceration at the tip of the tongue. No thrush. Resp: Lungs clear bilaterally. Cardio: Regular rate and rhythm. GI: Abdomen soft and nontender. No organomegaly. Vascular: No leg edema. Calves soft and nontender.  Lab Results:  Lab Results  Component Value Date   WBC 10.2 08/08/2016   HGB 14.1 08/08/2016   HCT 44.8 08/08/2016   MCV 78.6 (L) 08/08/2016   PLT 1,355 Giant platelets present (H) 08/08/2016   NEUTROABS 7.5 (H) 08/08/2016    Imaging:  No results found.  Medications: I have reviewed the patient's current medications.  Assessment/Plan: 1. Polycythemia vera on hydroxyurea. Last phlebotomy 09/08/2015 2. Hypertension, followed by Dr. Montez Morita.  3. History of hyperpigmentation at the hands. Question related to hydroxyurea. 4. Hip fracture February 2013. She underwent a bipolar hip arthroplasty on 04/23/2012. 5. Thrombocytosis. Most likely secondary to polycythemia vera and iron deficiency. 6. Hospitalization with left shoulder cellulitis February 2016. 7. Pelvic fracture after a fall 05/31/2015 8. Weight loss-etiology unclear; weight stable 05/04/2017And 01/27/2016 9. Right anterior buccal ulcer 03/09/2016   Disposition: Alejandra [name] appears stable. She will continue hydroxyurea 1500 mg daily. She will return for a CBC in 4 weeks and a follow-up visit in 8 weeks. She will contact the office in the interim with any problems. We specifically discussed  progression of the tongue ulcer.  Plan reviewed with Dr. Benay Spice.    Ned Card ANP/GNP-BC   08/08/2016  12:35 PM

## 2016-08-21 ENCOUNTER — Ambulatory Visit
Admission: RE | Admit: 2016-08-21 | Discharge: 2016-08-21 | Disposition: A | Discharge status: Home / Self Care | Payer: Medicare HMO | Source: Ambulatory Visit | Attending: Pulmonary Disease | Admitting: Pulmonary Disease

## 2016-08-21 DIAGNOSIS — Z1231 Encounter for screening mammogram for malignant neoplasm of breast: Secondary | ICD-10-CM | POA: Diagnosis not present

## 2016-08-31 ENCOUNTER — Telehealth: Payer: Self-pay | Admitting: Oncology

## 2016-08-31 NOTE — Telephone Encounter (Signed)
Patient called to cancel and reschedule her appointment for Monday she said that she had another appointment  with another dr

## 2016-09-01 ENCOUNTER — Ambulatory Visit: Payer: Medicare HMO | Admitting: Oncology

## 2016-09-01 ENCOUNTER — Other Ambulatory Visit: Payer: Medicare HMO

## 2016-09-04 ENCOUNTER — Other Ambulatory Visit: Payer: Medicare HMO

## 2016-09-04 DIAGNOSIS — M159 Polyosteoarthritis, unspecified: Secondary | ICD-10-CM | POA: Diagnosis not present

## 2016-09-04 DIAGNOSIS — D751 Secondary polycythemia: Secondary | ICD-10-CM | POA: Diagnosis not present

## 2016-09-04 DIAGNOSIS — E785 Hyperlipidemia, unspecified: Secondary | ICD-10-CM | POA: Diagnosis not present

## 2016-09-04 DIAGNOSIS — Z87891 Personal history of nicotine dependence: Secondary | ICD-10-CM | POA: Diagnosis not present

## 2016-09-04 DIAGNOSIS — R350 Frequency of micturition: Secondary | ICD-10-CM | POA: Diagnosis not present

## 2016-09-04 DIAGNOSIS — I119 Hypertensive heart disease without heart failure: Secondary | ICD-10-CM | POA: Diagnosis not present

## 2016-09-04 DIAGNOSIS — S32501A Unspecified fracture of right pubis, initial encounter for closed fracture: Secondary | ICD-10-CM | POA: Diagnosis not present

## 2016-09-04 DIAGNOSIS — Z79899 Other long term (current) drug therapy: Secondary | ICD-10-CM | POA: Diagnosis not present

## 2016-09-04 DIAGNOSIS — R825 Elevated urine levels of drugs, medicaments and biological substances: Secondary | ICD-10-CM | POA: Diagnosis not present

## 2016-09-04 DIAGNOSIS — N183 Chronic kidney disease, stage 3 (moderate): Secondary | ICD-10-CM | POA: Diagnosis not present

## 2016-09-05 DIAGNOSIS — N390 Urinary tract infection, site not specified: Secondary | ICD-10-CM | POA: Diagnosis not present

## 2016-09-11 ENCOUNTER — Ambulatory Visit (HOSPITAL_BASED_OUTPATIENT_CLINIC_OR_DEPARTMENT_OTHER): Payer: Medicare HMO | Admitting: Oncology

## 2016-09-11 ENCOUNTER — Other Ambulatory Visit (HOSPITAL_BASED_OUTPATIENT_CLINIC_OR_DEPARTMENT_OTHER): Payer: Medicare HMO

## 2016-09-11 ENCOUNTER — Telehealth: Payer: Self-pay | Admitting: Oncology

## 2016-09-11 VITALS — BP 168/92 | HR 72 | Temp 98.2°F | Resp 18 | Ht 63.5 in | Wt 115.2 lb

## 2016-09-11 DIAGNOSIS — D473 Essential (hemorrhagic) thrombocythemia: Secondary | ICD-10-CM

## 2016-09-11 DIAGNOSIS — I10 Essential (primary) hypertension: Secondary | ICD-10-CM

## 2016-09-11 DIAGNOSIS — D45 Polycythemia vera: Secondary | ICD-10-CM

## 2016-09-11 DIAGNOSIS — D75839 Thrombocytosis, unspecified: Secondary | ICD-10-CM

## 2016-09-11 LAB — CBC WITH DIFFERENTIAL/PLATELET
BASO%: 2 % (ref 0.0–2.0)
BASOS ABS: 0.1 10*3/uL (ref 0.0–0.1)
EOS%: 5.1 % (ref 0.0–7.0)
Eosinophils Absolute: 0.3 10*3/uL (ref 0.0–0.5)
HCT: 41.6 % (ref 34.8–46.6)
HGB: 13.3 g/dL (ref 11.6–15.9)
LYMPH%: 11.2 % — ABNORMAL LOW (ref 14.0–49.7)
MCH: 26.4 pg (ref 25.1–34.0)
MCHC: 32 g/dL (ref 31.5–36.0)
MCV: 82.5 fL (ref 79.5–101.0)
MONO#: 0.2 10*3/uL (ref 0.1–0.9)
MONO%: 2.5 % (ref 0.0–14.0)
NEUT#: 5.1 10*3/uL (ref 1.5–6.5)
NEUT%: 79.2 % — AB (ref 38.4–76.8)
RBC: 5.04 10*6/uL (ref 3.70–5.45)
RDW: 23.4 % — ABNORMAL HIGH (ref 11.2–14.5)
WBC: 6.4 10*3/uL (ref 3.9–10.3)
lymph#: 0.7 10*3/uL — ABNORMAL LOW (ref 0.9–3.3)

## 2016-09-11 LAB — TECHNOLOGIST REVIEW

## 2016-09-11 NOTE — Telephone Encounter (Signed)
Appointments scheduled per 1/29 LOS. Patient given AVS report and calendars with future scheduled appointments. °

## 2016-09-11 NOTE — Progress Notes (Signed)
  [place] OFFICE PROGRESS NOTE   Diagnosis: Polycythemia vera  INTERVAL HISTORY:   Ms. Setaro returns as scheduled. She continues hydroxyurea at a dose of 1500 mg daily. No bleeding or symptom of thrombosis. No new complaint. The hydroxyurea dose was increased to 1500 mg daily on 06/26/2016.  Objective:  Vital signs in last 24 hours:  Blood pressure (!) 172/97, pulse 72, temperature 98.2 F (36.8 C), temperature source Oral, resp. rate 18, height 5' 3.5" (1.613 m), weight 115 lb 3.2 oz (52.3 kg), SpO2 100 %.    HEENT: No Thrush or ulcers Resp: Lungs clear bilaterally, distant breath sounds Cardio: Regular rate and rhythm GI: No hepatosplenomegaly Vascular: No leg edema   Lab Results:  Lab Results  Component Value Date   WBC 6.4 09/11/2016   HGB 13.3 09/11/2016   HCT 41.6 09/11/2016   MCV 82.5 09/11/2016   PLT 661 Large & giant platelets (H) 09/11/2016   NEUTROABS 5.1 09/11/2016     Medications: I have reviewed the patient's current medications.  Assessment/Plan: 1. Polycythemia vera on hydroxyurea. Last phlebotomy 09/08/2015  Hydroxyurea dose increased to 1500 mg daily 06/26/2016  Hydroxyurea changed to 1500 mg daily on Monday through Friday, 1000 mg on Saturday and Sunday beginning 09/11/2016 2. Hypertension, followed by Dr. Montez Morita.  3. History of hyperpigmentation at the hands. Question related to hydroxyurea. 4. Hip fracture February 2013. She underwent a bipolar hip arthroplasty on 04/23/2012. 5. Thrombocytosis. Most likely secondary to polycythemia vera and iron deficiency. 6. Hospitalization with left shoulder cellulitis February 2016. 7. Pelvic fracture after a fall 05/31/2015 8. Weight loss-etiology unclear; weight stable 05/04/2017And 01/27/2016 9. Right anterior buccal ulcer 03/09/2016     Disposition:  Ms Addis appears stable. The erythrocytosis and thrombocytosis are better controlled on the increased hydroxyurea dose. We  decrease the dose to 1500 mg on Monday-Friday and 1000 mg on Saturday-Sunday. She will return for a CBC in 4 weeks and an office visit in 8 weeks.  Betsy Coder, MD  09/11/2016  9:16 AM

## 2016-10-09 ENCOUNTER — Other Ambulatory Visit (HOSPITAL_BASED_OUTPATIENT_CLINIC_OR_DEPARTMENT_OTHER): Payer: Medicare HMO

## 2016-10-09 DIAGNOSIS — D45 Polycythemia vera: Secondary | ICD-10-CM | POA: Diagnosis not present

## 2016-10-09 LAB — CBC WITH DIFFERENTIAL/PLATELET
BASO%: 0.9 % (ref 0.0–2.0)
Basophils Absolute: 0 10*3/uL (ref 0.0–0.1)
EOS%: 1.5 % (ref 0.0–7.0)
Eosinophils Absolute: 0.1 10*3/uL (ref 0.0–0.5)
HCT: 34 % — ABNORMAL LOW (ref 34.8–46.6)
HGB: 10.7 g/dL — ABNORMAL LOW (ref 11.6–15.9)
LYMPH%: 10.1 % — AB (ref 14.0–49.7)
MCH: 25.9 pg (ref 25.1–34.0)
MCHC: 31.4 g/dL — ABNORMAL LOW (ref 31.5–36.0)
MCV: 82.6 fL (ref 79.5–101.0)
MONO#: 0.3 10*3/uL (ref 0.1–0.9)
MONO%: 5.7 % (ref 0.0–14.0)
NEUT#: 3.7 10*3/uL (ref 1.5–6.5)
NEUT%: 81.8 % — AB (ref 38.4–76.8)
PLATELETS: 602 10*3/uL — AB (ref 145–400)
RBC: 4.12 10*6/uL (ref 3.70–5.45)
RDW: 22.6 % — ABNORMAL HIGH (ref 11.2–14.5)
WBC: 4.6 10*3/uL (ref 3.9–10.3)
lymph#: 0.5 10*3/uL — ABNORMAL LOW (ref 0.9–3.3)

## 2016-10-10 ENCOUNTER — Telehealth: Payer: Self-pay | Admitting: Oncology

## 2016-10-10 ENCOUNTER — Telehealth: Payer: Self-pay

## 2016-10-10 DIAGNOSIS — D45 Polycythemia vera: Secondary | ICD-10-CM

## 2016-10-10 NOTE — Telephone Encounter (Signed)
sw pt to confirm lab only appt 3/19 at 1130 am per LOS

## 2016-10-10 NOTE — Telephone Encounter (Signed)
Called and informed pt hgb and platelets are better, informed her per MD to decrease hydroxyurea to 1500mg  MWF and 1000mg  other days. Pt verbalized understanding and informed her MD wants to recheck CBC in 3 weeks, scheduling to call. Pt verbalized understanding.

## 2016-10-10 NOTE — Telephone Encounter (Signed)
-----   Message from Ladell Pier, MD sent at 10/09/2016  8:12 PM EST ----- Please call patient, hb and platelets are better, decrease hydroxyurea to 1500mg  m,w,f, ; 1000mg  other days Check cbc 3 weekw

## 2016-10-30 ENCOUNTER — Other Ambulatory Visit (HOSPITAL_BASED_OUTPATIENT_CLINIC_OR_DEPARTMENT_OTHER): Payer: Medicare HMO

## 2016-10-30 DIAGNOSIS — D45 Polycythemia vera: Secondary | ICD-10-CM | POA: Diagnosis not present

## 2016-10-30 LAB — CBC WITH DIFFERENTIAL/PLATELET
BASO%: 1.4 % (ref 0.0–2.0)
Basophils Absolute: 0.2 10*3/uL — ABNORMAL HIGH (ref 0.0–0.1)
EOS%: 2.6 % (ref 0.0–7.0)
Eosinophils Absolute: 0.3 10*3/uL (ref 0.0–0.5)
HCT: 32.2 % — ABNORMAL LOW (ref 34.8–46.6)
HGB: 9.9 g/dL — ABNORMAL LOW (ref 11.6–15.9)
LYMPH%: 7.5 % — AB (ref 14.0–49.7)
MCH: 25.3 pg (ref 25.1–34.0)
MCHC: 30.7 g/dL — AB (ref 31.5–36.0)
MCV: 82.4 fL (ref 79.5–101.0)
MONO#: 0.9 10*3/uL (ref 0.1–0.9)
MONO%: 7.8 % (ref 0.0–14.0)
NEUT#: 9 10*3/uL — ABNORMAL HIGH (ref 1.5–6.5)
NEUT%: 80.7 % — AB (ref 38.4–76.8)
Platelets: 758 10*3/uL — ABNORMAL HIGH (ref 145–400)
RBC: 3.91 10*6/uL (ref 3.70–5.45)
RDW: 26.8 % — AB (ref 11.2–14.5)
WBC: 11.1 10*3/uL — AB (ref 3.9–10.3)
lymph#: 0.8 10*3/uL — ABNORMAL LOW (ref 0.9–3.3)
nRBC: 0 % (ref 0–0)

## 2016-10-30 LAB — TECHNOLOGIST REVIEW

## 2016-10-31 ENCOUNTER — Telehealth: Payer: Self-pay | Admitting: *Deleted

## 2016-10-31 NOTE — Telephone Encounter (Signed)
Return call received from patient.  Dr. Florene Route orders and appointment information provided with this call.  No further questions.  I was told the last time to take 1000 mg daily.  I'll continue this."  Medication updated in EPIC.

## 2016-10-31 NOTE — Telephone Encounter (Signed)
-----   Message from Ladell Pier, MD sent at 10/30/2016  6:31 PM EDT ----- Please call patient, change hydroxyurea to 1000mg  daily, f/u 3/26 as scheduled

## 2016-11-06 ENCOUNTER — Other Ambulatory Visit: Payer: Medicare HMO

## 2016-11-06 ENCOUNTER — Ambulatory Visit (HOSPITAL_BASED_OUTPATIENT_CLINIC_OR_DEPARTMENT_OTHER): Payer: Medicaid Other

## 2016-11-06 ENCOUNTER — Telehealth: Payer: Self-pay | Admitting: Nurse Practitioner

## 2016-11-06 ENCOUNTER — Ambulatory Visit (HOSPITAL_BASED_OUTPATIENT_CLINIC_OR_DEPARTMENT_OTHER): Payer: Medicare HMO | Admitting: Nurse Practitioner

## 2016-11-06 VITALS — BP 179/86 | HR 73 | Temp 98.3°F | Resp 18 | Ht 63.5 in | Wt 117.8 lb

## 2016-11-06 DIAGNOSIS — D45 Polycythemia vera: Secondary | ICD-10-CM

## 2016-11-06 LAB — CBC WITH DIFFERENTIAL/PLATELET
BASO%: 1.5 % (ref 0.0–2.0)
BASOS ABS: 0.2 10*3/uL — AB (ref 0.0–0.1)
EOS%: 3 % (ref 0.0–7.0)
Eosinophils Absolute: 0.4 10*3/uL (ref 0.0–0.5)
HCT: 32.8 % — ABNORMAL LOW (ref 34.8–46.6)
HGB: 10.1 g/dL — ABNORMAL LOW (ref 11.6–15.9)
LYMPH%: 6.9 % — ABNORMAL LOW (ref 14.0–49.7)
MCH: 25.6 pg (ref 25.1–34.0)
MCHC: 30.8 g/dL — AB (ref 31.5–36.0)
MCV: 83 fL (ref 79.5–101.0)
MONO#: 0.5 10*3/uL (ref 0.1–0.9)
MONO%: 3.7 % (ref 0.0–14.0)
NEUT#: 10.7 10*3/uL — ABNORMAL HIGH (ref 1.5–6.5)
NEUT%: 84.9 % — AB (ref 38.4–76.8)
Platelets: 824 10*3/uL — ABNORMAL HIGH (ref 145–400)
RBC: 3.95 10*6/uL (ref 3.70–5.45)
RDW: 27.7 % — AB (ref 11.2–14.5)
WBC: 12.6 10*3/uL — ABNORMAL HIGH (ref 3.9–10.3)
lymph#: 0.9 10*3/uL (ref 0.9–3.3)
nRBC: 0 % (ref 0–0)

## 2016-11-06 NOTE — Progress Notes (Signed)
  [place] OFFICE PROGRESS NOTE   Diagnosis: Polycythemia vera  INTERVAL HISTORY:   Ms. Brott returns as scheduled. Hydroxyurea dose was adjusted to 1000 mg daily beginning 10/31/2016. She feels "tired". No shortness of breath or chest pain. No bleeding. No symptoms of thrombosis.  Objective:  Vital signs in last 24 hours:  Blood pressure (!) 179/86, pulse 73, temperature 98.3 F (36.8 C), temperature source Oral, resp. rate 18, height 5' 3.5" (1.613 m), weight 117 lb 12.8 oz (53.4 kg), SpO2 100 %.    HEENT: No thrush or ulcers. Resp: Lungs clear bilaterally. Cardio: Regular rate and rhythm. GI: Abdomen soft and nontender. No organomegaly. Vascular: No leg edema. Calves soft and nontender.  Lab Results:  Lab Results  Component Value Date   WBC 11.1 (H) 10/30/2016   HGB 9.9 (L) 10/30/2016   HCT 32.2 (L) 10/30/2016   MCV 82.4 10/30/2016   PLT 758 (H) 10/30/2016   NEUTROABS 9.0 (H) 10/30/2016    Imaging:  No results found.  Medications: I have reviewed the patient's current medications.  Assessment/Plan: 1. Polycythemia vera on hydroxyurea. Last phlebotomy 09/08/2015  Hydroxyurea dose increased to 1500 mg daily 06/26/2016  Hydroxyurea changed to 1500 mg daily on Monday through Friday, 1000 mg on Saturday and Sunday beginning 09/11/2016  Hydroxyurea changed to 1500 mg Monday Wednesday and Friday and 1000 mg other days beginning 10/10/2016  Hydroxyurea changed to 1000 mg daily beginning 10/31/2016 2. Hypertension, followed by Dr. Montez Morita.  3. History of hyperpigmentation at the hands. Question related to hydroxyurea. 4. Hip fracture February 2013. She underwent a bipolar hip arthroplasty on 04/23/2012. 5. Thrombocytosis. Most likely secondary to polycythemia vera and iron deficiency. 6. Hospitalization with left shoulder cellulitis February 2016. 7. Pelvic fracture after a fall 05/31/2015 8. Weight loss-etiology unclear; weight stable  05/04/2017And 01/27/2016 9. Right anterior buccal ulcer 03/09/2016   Disposition: Ms. Dolinski appears stable. Her hemoglobin has declined significantly. This is likely related to the Hydrea. We are obtaining a follow-up CBC today and will give her further instructions on the Hydrea dose pending those results. She will return for a CBC in 2 weeks. We scheduled a two-month follow-up visit.  Plan reviewed with Dr. Benay Spice.    Ned Card ANP/GNP-BC   11/06/2016  11:13 AM

## 2016-11-06 NOTE — Telephone Encounter (Signed)
Gave patient AVS and calender per 11/06/2016 los. Sent patient to lab after per 11/06/2016 los

## 2016-11-20 ENCOUNTER — Other Ambulatory Visit (HOSPITAL_BASED_OUTPATIENT_CLINIC_OR_DEPARTMENT_OTHER): Payer: Medicare HMO

## 2016-11-20 DIAGNOSIS — M159 Polyosteoarthritis, unspecified: Secondary | ICD-10-CM | POA: Diagnosis not present

## 2016-11-20 DIAGNOSIS — E784 Other hyperlipidemia: Secondary | ICD-10-CM | POA: Diagnosis not present

## 2016-11-20 DIAGNOSIS — I119 Hypertensive heart disease without heart failure: Secondary | ICD-10-CM | POA: Diagnosis not present

## 2016-11-20 DIAGNOSIS — M2550 Pain in unspecified joint: Secondary | ICD-10-CM | POA: Diagnosis not present

## 2016-11-20 DIAGNOSIS — R350 Frequency of micturition: Secondary | ICD-10-CM | POA: Diagnosis not present

## 2016-11-20 DIAGNOSIS — N183 Chronic kidney disease, stage 3 (moderate): Secondary | ICD-10-CM | POA: Diagnosis not present

## 2016-11-20 DIAGNOSIS — D751 Secondary polycythemia: Secondary | ICD-10-CM | POA: Diagnosis not present

## 2016-11-20 DIAGNOSIS — E785 Hyperlipidemia, unspecified: Secondary | ICD-10-CM | POA: Diagnosis not present

## 2016-11-20 DIAGNOSIS — Z9181 History of falling: Secondary | ICD-10-CM | POA: Diagnosis not present

## 2016-11-20 DIAGNOSIS — D45 Polycythemia vera: Secondary | ICD-10-CM | POA: Diagnosis not present

## 2016-11-20 DIAGNOSIS — Z79899 Other long term (current) drug therapy: Secondary | ICD-10-CM | POA: Diagnosis not present

## 2016-11-20 DIAGNOSIS — S32501D Unspecified fracture of right pubis, subsequent encounter for fracture with routine healing: Secondary | ICD-10-CM | POA: Diagnosis not present

## 2016-11-20 LAB — CBC WITH DIFFERENTIAL/PLATELET
BASO%: 1.5 % (ref 0.0–2.0)
BASOS ABS: 0.1 10*3/uL (ref 0.0–0.1)
EOS%: 3.5 % (ref 0.0–7.0)
Eosinophils Absolute: 0.3 10*3/uL (ref 0.0–0.5)
HCT: 32.4 % — ABNORMAL LOW (ref 34.8–46.6)
HGB: 9.9 g/dL — ABNORMAL LOW (ref 11.6–15.9)
LYMPH%: 9.1 % — AB (ref 14.0–49.7)
MCH: 26 pg (ref 25.1–34.0)
MCHC: 30.6 g/dL — ABNORMAL LOW (ref 31.5–36.0)
MCV: 85 fL (ref 79.5–101.0)
MONO#: 0.7 10*3/uL (ref 0.1–0.9)
MONO%: 7.5 % (ref 0.0–14.0)
NEUT#: 7.5 10*3/uL — ABNORMAL HIGH (ref 1.5–6.5)
NEUT%: 78.4 % — AB (ref 38.4–76.8)
Platelets: 1049 10*3/uL — ABNORMAL HIGH (ref 145–400)
RBC: 3.81 10*6/uL (ref 3.70–5.45)
RDW: 28.8 % — ABNORMAL HIGH (ref 11.2–14.5)
WBC: 9.6 10*3/uL (ref 3.9–10.3)
lymph#: 0.9 10*3/uL (ref 0.9–3.3)
nRBC: 0 % (ref 0–0)

## 2016-11-21 DIAGNOSIS — M48061 Spinal stenosis, lumbar region without neurogenic claudication: Secondary | ICD-10-CM | POA: Diagnosis not present

## 2016-11-21 DIAGNOSIS — M545 Low back pain: Secondary | ICD-10-CM | POA: Diagnosis not present

## 2016-11-21 DIAGNOSIS — M47816 Spondylosis without myelopathy or radiculopathy, lumbar region: Secondary | ICD-10-CM | POA: Diagnosis not present

## 2016-11-23 ENCOUNTER — Telehealth: Payer: Self-pay | Admitting: *Deleted

## 2016-11-23 NOTE — Telephone Encounter (Signed)
-----   Message from Owens Shark, NP sent at 11/22/2016  4:41 PM EDT ----- Please call her and confirm current dose of Hydrea.

## 2016-11-23 NOTE — Telephone Encounter (Signed)
Telephone call to patient- no answer on cell phone   Need to confirm Hydrea dose is  hydroxyurea (HYDREA) 500 MG capsule  Sig : Take 1,500 mg by mouth daily. Except Sat,Sun take 2 tabs (1,000 mg)  Route: Oral  Class: Historical Med

## 2016-11-24 NOTE — Telephone Encounter (Signed)
Spoke to patient this morning she ran out out Hydrea on Sunday. She has not taken any since Sunday. She has been taking 3 tablets daily prior.  Ned Card, NP notified and will follow up with Dr. Benay Spice on how to proceed.

## 2016-11-24 NOTE — Telephone Encounter (Signed)
-----   Message from Owens Shark, NP sent at 11/22/2016  4:41 PM EDT ----- Please call her and confirm current dose of Hydrea.

## 2016-11-27 ENCOUNTER — Telehealth: Payer: Self-pay | Admitting: *Deleted

## 2016-11-27 NOTE — Telephone Encounter (Signed)
Left message for patient to return call-  Hold the hydrea, f/u with cbc 3-4 weeks   Staff message sent to scheduling to make lab appt.

## 2016-11-28 ENCOUNTER — Telehealth: Payer: Self-pay | Admitting: Nurse Practitioner

## 2016-11-28 NOTE — Telephone Encounter (Signed)
Called patient to inform her of next scheduled appointment for 5.7.18. No VM to leave a message for patient. Sent out letter in mail.

## 2016-12-06 ENCOUNTER — Telehealth: Payer: Self-pay | Admitting: *Deleted

## 2016-12-06 NOTE — Telephone Encounter (Signed)
Call placed to patient after receiving message from call center regarding her appointments.  Appointments reviewed with patient. Patient appreciative of call and has no questions at this time.

## 2016-12-11 ENCOUNTER — Emergency Department (HOSPITAL_COMMUNITY)
Admission: EM | Admit: 2016-12-11 | Discharge: 2016-12-11 | Disposition: A | Discharge status: Home / Self Care | Payer: Medicare HMO | Attending: Emergency Medicine | Admitting: Emergency Medicine

## 2016-12-11 ENCOUNTER — Encounter (HOSPITAL_COMMUNITY): Payer: Self-pay | Admitting: Nurse Practitioner

## 2016-12-11 ENCOUNTER — Emergency Department (HOSPITAL_COMMUNITY): Payer: Medicare HMO

## 2016-12-11 DIAGNOSIS — S0003XA Contusion of scalp, initial encounter: Secondary | ICD-10-CM | POA: Diagnosis not present

## 2016-12-11 DIAGNOSIS — S0083XA Contusion of other part of head, initial encounter: Secondary | ICD-10-CM | POA: Insufficient documentation

## 2016-12-11 DIAGNOSIS — I10 Essential (primary) hypertension: Secondary | ICD-10-CM | POA: Diagnosis not present

## 2016-12-11 DIAGNOSIS — W19XXXA Unspecified fall, initial encounter: Secondary | ICD-10-CM

## 2016-12-11 DIAGNOSIS — Y999 Unspecified external cause status: Secondary | ICD-10-CM | POA: Insufficient documentation

## 2016-12-11 DIAGNOSIS — Z87891 Personal history of nicotine dependence: Secondary | ICD-10-CM | POA: Insufficient documentation

## 2016-12-11 DIAGNOSIS — W01198A Fall on same level from slipping, tripping and stumbling with subsequent striking against other object, initial encounter: Secondary | ICD-10-CM | POA: Insufficient documentation

## 2016-12-11 DIAGNOSIS — R350 Frequency of micturition: Secondary | ICD-10-CM | POA: Insufficient documentation

## 2016-12-11 DIAGNOSIS — T148XXA Other injury of unspecified body region, initial encounter: Secondary | ICD-10-CM

## 2016-12-11 DIAGNOSIS — S199XXA Unspecified injury of neck, initial encounter: Secondary | ICD-10-CM | POA: Diagnosis not present

## 2016-12-11 DIAGNOSIS — Z7982 Long term (current) use of aspirin: Secondary | ICD-10-CM | POA: Insufficient documentation

## 2016-12-11 DIAGNOSIS — Z96643 Presence of artificial hip joint, bilateral: Secondary | ICD-10-CM | POA: Insufficient documentation

## 2016-12-11 DIAGNOSIS — Y939 Activity, unspecified: Secondary | ICD-10-CM | POA: Diagnosis not present

## 2016-12-11 DIAGNOSIS — S0990XA Unspecified injury of head, initial encounter: Secondary | ICD-10-CM | POA: Diagnosis present

## 2016-12-11 DIAGNOSIS — Y929 Unspecified place or not applicable: Secondary | ICD-10-CM | POA: Diagnosis not present

## 2016-12-11 DIAGNOSIS — S0081XA Abrasion of other part of head, initial encounter: Secondary | ICD-10-CM | POA: Diagnosis not present

## 2016-12-11 LAB — URINALYSIS, ROUTINE W REFLEX MICROSCOPIC
Bilirubin Urine: NEGATIVE
Glucose, UA: NEGATIVE mg/dL
Hgb urine dipstick: NEGATIVE
Ketones, ur: NEGATIVE mg/dL
Nitrite: NEGATIVE
PROTEIN: NEGATIVE mg/dL
SPECIFIC GRAVITY, URINE: 1.017 (ref 1.005–1.030)
pH: 5 (ref 5.0–8.0)

## 2016-12-11 NOTE — ED Provider Notes (Signed)
[facility] DEPT Provider Note   CSN: [id number] Arrival date & time: 12/11/16  1537     History   Chief Complaint Chief Complaint  Patient presents with  . Fall  . Head Injury    HPI Alejandra [name] is a 76 y.o. female.  The history is provided by the patient.  Fall  This is a new problem. The current episode started 3 to 5 hours ago. The problem occurs constantly. The problem has been resolved. Associated symptoms include headaches. Pertinent negatives include no chest pain, no abdominal pain and no shortness of breath. Nothing aggravates the symptoms. Nothing relieves the symptoms. She has tried nothing for the symptoms. The treatment provided no relief.    Past Medical History:  Diagnosis Date  . Arthritis   . Avascular necrosis of femur head, right ([place]) 04/23/2012  . Cellulitis of shoulder 09/28/2014  . Fracture 10/05/11   "fell and broke right hip"  . Fracture of femoral neck, right ([place]) 10/12/2011  . Glaucoma   . Glaucoma    bilateral  . Hypertension   . Polycythemia vera(238.4)   . Rhabdomyolysis 09/28/2014  . Thrombocytosis ([place]) 04/23/2012    Patient Active Problem List   Diagnosis Date Noted  . Vertigo 09/28/2014  . Essential hypertension 09/28/2014  . Thrombocytosis ([place]) 04/23/2012  . Polycythemia vera ([place]) 12/22/2011    Past Surgical History:  Procedure Laterality Date  . COLONOSCOPY    . HARDWARE REMOVAL  04/23/2012   Procedure: HARDWARE REMOVAL;  Surgeon: Johnny Bridge, MD;  Location: [facility];  Service: Orthopedics;  Laterality: Right;  . HIP ARTHROPLASTY  04/23/2012   Procedure: ARTHROPLASTY BIPOLAR HIP;  Surgeon: Johnny Bridge, MD;  Location: [facility];  Service: Orthopedics;  Laterality: Right;  . HIP PINNING,CANNULATED  10/12/2011   Procedure: CANNULATED HIP PINNING;  Surgeon: Johnny Bridge, MD;  Location: [facility];  Service: Orthopedics;  Laterality: Right;    OB History    No data available       Home Medications    Prior to Admission  medications   Medication Sig Start Date End Date Taking? Authorizing Provider  ALPRAZolam (XANAX) 0.25 MG tablet Take 0.25 mg by mouth 2 (two) times daily as needed for anxiety.    Yes Historical Provider, MD  amLODipine (NORVASC) 10 MG tablet Take 10 mg by mouth daily.   Yes Historical Provider, MD  aspirin EC 81 MG tablet Take 81 mg by mouth daily.   Yes Historical Provider, MD  atorvastatin (LIPITOR) 10 MG tablet Take 10 mg by mouth daily. 07/12/15  Yes Historical Provider, MD  benazepril (LOTENSIN) 40 MG tablet Take 40 mg by mouth daily.   Yes Historical Provider, MD  carvedilol (COREG) 3.125 MG tablet Take 3.125 mg by mouth daily.  11/18/13  Yes Historical Provider, MD  chlorthalidone (HYGROTON) 25 MG tablet Take 25 mg by mouth daily. 06/09/16  Yes Historical Provider, MD  dorzolamide-timolol (COSOPT) 22.3-6.8 MG/ML ophthalmic solution Place 1 drop into both eyes 2 (two) times daily.   Yes Historical Provider, MD  hydroxyurea (HYDREA) 500 MG capsule Take 1,500 mg by mouth daily. Except Sat,Sun take 2 tabs  (1,000 mg)   Yes Historical Provider, MD  loratadine (CLARITIN) 10 MG tablet Take 10 mg by mouth daily as needed for allergies.   Yes Historical Provider, MD  meclizine (ANTIVERT) 25 MG tablet Take 25 mg by mouth 3 (three) times daily as needed for dizziness.  03/25/14  Yes Historical Provider, MD  SIMBRINZA 1-0.2 % SUSP Place 1 drop into both eyes at bedtime.  12/14/15  Yes Historical Provider, MD  traMADol (ULTRAM) 50 MG tablet Take 50 mg by mouth every 12 (twelve) hours as needed for severe pain.  10/13/14  Yes Historical Provider, MD  TRAVATAN Z 0.004 % SOLN ophthalmic solution Place 1 drop into both eyes at bedtime. 05/26/15  Yes Historical Provider, MD  baclofen (LIORESAL) 10 MG tablet Take 10 mg by mouth 3 (three) times daily. 12/13/15   Historical Provider, MD  naproxen (NAPROSYN) 250 MG tablet One tab po bid with meals Patient not taking: Reported on 12/11/2016 03/28/16   Janne Napoleon, NP     Family History History reviewed. No pertinent family history.  Social History Social History  Substance Use Topics  . Smoking status: Former Smoker    Packs/day: 0.12    Types: Cigarettes    Quit date: 08/15/1991  . Smokeless tobacco: Never Used  . Alcohol use No     Comment: 10/12/11 "did drink in my younger; don't drink at all now"     Allergies   Patient has no known allergies.   Review of Systems Review of Systems  Constitutional: Negative for chills and fever.  HENT: Negative for ear pain and sore throat.   Eyes: Negative for pain and visual disturbance.  Respiratory: Negative for cough and shortness of breath.   Cardiovascular: Negative for chest pain and palpitations.  Gastrointestinal: Negative for abdominal pain and vomiting.  Genitourinary: Positive for frequency. Negative for dysuria and hematuria.  Musculoskeletal: Negative for arthralgias and back pain.  Skin: Positive for wound. Negative for color change and rash.  Neurological: Positive for headaches. Negative for seizures and syncope.  All other systems reviewed and are negative.    Physical Exam Updated Vital Signs BP 135/81   Pulse (!) 56   Temp 98.1 F (36.7 C) (Oral)   Resp 18   SpO2 100%   Physical Exam  Constitutional: She appears well-developed and well-nourished. No distress.  HENT:  Head: Normocephalic. Head is with abrasion.    Eyes: Conjunctivae and EOM are normal.  Neck: Neck supple. No spinous process tenderness present. No neck rigidity. Normal range of motion present.  Cardiovascular: Normal rate and regular rhythm.   No murmur heard. Pulmonary/Chest: Effort normal and breath sounds normal. No respiratory distress.  Abdominal: Soft. There is no tenderness.  Musculoskeletal: She exhibits no edema.  Neurological: She is alert. She has normal strength. No cranial nerve deficit or sensory deficit. GCS eye subscore is 4. GCS verbal subscore is 5. GCS motor subscore is 6.  Skin:  Skin is warm and dry. Abrasion noted.  Psychiatric: She has a normal mood and affect. Her speech is normal. Cognition and memory are normal.  Nursing note and vitals reviewed.    ED Treatments / Results  Labs (all labs ordered are listed, but only abnormal results are displayed) Labs Reviewed  URINALYSIS, ROUTINE W REFLEX MICROSCOPIC - Abnormal; Notable for the following:       Result Value   Leukocytes, UA TRACE (*)    Bacteria, UA RARE (*)    Squamous Epithelial / LPF 0-5 (*)    All other components within normal limits    EKG  EKG Interpretation None       Radiology Ct Head Wo Contrast  Result Date: 12/11/2016 CLINICAL DATA:  Hit head against edge of door with laceration above the eye EXAM: CT HEAD WITHOUT CONTRAST CT CERVICAL SPINE WITHOUT CONTRAST  TECHNIQUE: Multidetector CT imaging of the head and cervical spine was performed following the standard protocol without intravenous contrast. Multiplanar CT image reconstructions of the cervical spine were also generated. COMPARISON:  11/07/2015 FINDINGS: CT HEAD FINDINGS Brain: No acute territorial infarct hemorrhage or intracranial mass. Mild periventricular white matter small vessel ischemic changes. Stable slightly enlarged ventricles with mild atrophy. No midline shift. Vascular: No hyperdense vessels.  Carotid artery calcifications. Skull: No fracture.  No suspicious bone lesion Sinuses/Orbits: Minimal mucosal thickening in the ethmoid sinuses. No acute orbital abnormality. Other: Small right frontal scalp laceration CT CERVICAL SPINE FINDINGS Alignment: Mild reversal of cervical lordosis. No subluxation. Facet alignment is within normal limits. Skull base and vertebrae: Craniovertebral junction appears intact. No fracture. Ligamentous calcification posterior to the dens. Soft tissues and spinal canal: No prevertebral fluid or swelling. No visible canal hematoma. Disc levels: Moderate-to-marked degenerative changes at C4-C5, moderate  degenerative disc changes at C3-C4 and C5-C6. Multilevel bilateral facet arthropathy. Mild left foraminal narrowing at C3-C4 and C4-C5. Upper chest: Lung apices clear.  Thyroid normal. Other: None IMPRESSION: 1. No CT evidence for acute intracranial abnormality. Right frontal scalp hematoma. No fracture. 2. Mild periventricular small vessel ischemic change 3. Mild reversal of cervical lordosis. Degenerative changes. No acute fracture or malalignment. Electronically Signed   By: Donavan Foil M.D.   On: 12/11/2016 22:19   Ct Cervical Spine Wo Contrast  Result Date: 12/11/2016 CLINICAL DATA:  Hit head against edge of door with laceration above the eye EXAM: CT HEAD WITHOUT CONTRAST CT CERVICAL SPINE WITHOUT CONTRAST TECHNIQUE: Multidetector CT imaging of the head and cervical spine was performed following the standard protocol without intravenous contrast. Multiplanar CT image reconstructions of the cervical spine were also generated. COMPARISON:  11/07/2015 FINDINGS: CT HEAD FINDINGS Brain: No acute territorial infarct hemorrhage or intracranial mass. Mild periventricular white matter small vessel ischemic changes. Stable slightly enlarged ventricles with mild atrophy. No midline shift. Vascular: No hyperdense vessels.  Carotid artery calcifications. Skull: No fracture.  No suspicious bone lesion Sinuses/Orbits: Minimal mucosal thickening in the ethmoid sinuses. No acute orbital abnormality. Other: Small right frontal scalp laceration CT CERVICAL SPINE FINDINGS Alignment: Mild reversal of cervical lordosis. No subluxation. Facet alignment is within normal limits. Skull base and vertebrae: Craniovertebral junction appears intact. No fracture. Ligamentous calcification posterior to the dens. Soft tissues and spinal canal: No prevertebral fluid or swelling. No visible canal hematoma. Disc levels: Moderate-to-marked degenerative changes at C4-C5, moderate degenerative disc changes at C3-C4 and C5-C6. Multilevel  bilateral facet arthropathy. Mild left foraminal narrowing at C3-C4 and C4-C5. Upper chest: Lung apices clear.  Thyroid normal. Other: None IMPRESSION: 1. No CT evidence for acute intracranial abnormality. Right frontal scalp hematoma. No fracture. 2. Mild periventricular small vessel ischemic change 3. Mild reversal of cervical lordosis. Degenerative changes. No acute fracture or malalignment. Electronically Signed   By: Donavan Foil M.D.   On: 12/11/2016 22:19    Procedures Procedures (including critical care time)  Medications Ordered in ED Medications - No data to display   Initial Impression / Assessment and Plan / ED Course  I have reviewed the triage vital signs and the nursing notes.  Pertinent labs & imaging results that were available during my care of the patient were reviewed by me and considered in my medical decision making (see chart for details).     76 year old black female presents in the setting of mechanical fall. Patient reports she was turning around quickly in her house today when  she tripped and fell striking right side of head. Patient with mild abrasion to right-sided head and small hematoma. Patient without loss of consciousness. Patient not on blood thinners. Due to fall she presented for further evaluation. Additionally patient reports some mild increase in urination recently. No dysuria and no fevers noted.  On arrival patient hemodynamically stable and afebrile. On examination patient neurovascular intact. Awake alert and oriented 4. Family reports patient is at baseline. CT head obtained which revealed no acute intracranial out laterality. C-spine without fracture or malalignment. Patient did not want any medication for pain. Remained stable throughout the [facility] emergency department. Likely hematoma and abrasion after mechanical fall. Do not believe further labs, imaging, further evaluation necessary for this injury.  Urinalysis without signs of obvious  infection. After further discussion patient reports she has been having increased frequency for quite some time. Believe patient will require follow-up with PCP at this time for this condition. Not consistent with acute urinary tract infection. Do not believe antibiotics indicated.  Discharge home with strict return precautions and plan of follow-up with PCP. Stable at time of discharge.  Final Clinical Impressions(s) / ED Diagnoses   Final diagnoses:  Injury of head, initial encounter  Fall, initial encounter  Urinary frequency  Abrasion  Hematoma    New Prescriptions New Prescriptions   No medications on file     Esaw Grandchild, MD 12/11/16 2182    Varney Biles, MD 12/12/16 [id number]

## 2016-12-11 NOTE — ED Notes (Signed)
Patient transported to CT 

## 2016-12-11 NOTE — ED Notes (Signed)
Called CT for update on pt CT. Stated that pt had a 25 minute wait. Pt and family expressed frustration with delay in scan. Pt reassured that call was placed and she would be scanned shortly.

## 2016-12-11 NOTE — ED Notes (Signed)
Pt states after she fell this am she went to her pmd who sent her to ED for eval. Pt states she was going to pmd for eval of frequent urination. Denies pain. Denies loc. Moves all extremities without difficulty.

## 2016-12-11 NOTE — ED Triage Notes (Signed)
Pt presents with c/o head injury. She lost her balance without her cane this afternoon and fell hitting her forehead on a table. She denies loss of consciousness, pain. She has abrasion aND swelling to R forehead, no active bleeding.

## 2016-12-18 ENCOUNTER — Other Ambulatory Visit (HOSPITAL_BASED_OUTPATIENT_CLINIC_OR_DEPARTMENT_OTHER): Payer: Medicare HMO

## 2016-12-18 DIAGNOSIS — D473 Essential (hemorrhagic) thrombocythemia: Secondary | ICD-10-CM

## 2016-12-18 DIAGNOSIS — D45 Polycythemia vera: Secondary | ICD-10-CM | POA: Diagnosis not present

## 2016-12-18 DIAGNOSIS — D75839 Thrombocytosis, unspecified: Secondary | ICD-10-CM

## 2016-12-18 LAB — CBC WITH DIFFERENTIAL/PLATELET
BASO%: 2.3 % — AB (ref 0.0–2.0)
Basophils Absolute: 0.3 10*3/uL — ABNORMAL HIGH (ref 0.0–0.1)
EOS%: 4.2 % (ref 0.0–7.0)
Eosinophils Absolute: 0.6 10*3/uL — ABNORMAL HIGH (ref 0.0–0.5)
HCT: 31.2 % — ABNORMAL LOW (ref 34.8–46.6)
HGB: 9.9 g/dL — ABNORMAL LOW (ref 11.6–15.9)
LYMPH%: 5.2 % — AB (ref 14.0–49.7)
MCH: 27 pg (ref 25.1–34.0)
MCHC: 31.8 g/dL (ref 31.5–36.0)
MCV: 85 fL (ref 79.5–101.0)
MONO#: 0.7 10*3/uL (ref 0.1–0.9)
MONO%: 4.9 % (ref 0.0–14.0)
NEUT%: 83.4 % — ABNORMAL HIGH (ref 38.4–76.8)
NEUTROS ABS: 11.6 10*3/uL — AB (ref 1.5–6.5)
RBC: 3.68 10*6/uL — AB (ref 3.70–5.45)
RDW: 30 % — AB (ref 11.2–14.5)
WBC: 13.9 10*3/uL — AB (ref 3.9–10.3)
lymph#: 0.7 10*3/uL — ABNORMAL LOW (ref 0.9–3.3)

## 2016-12-18 LAB — TECHNOLOGIST REVIEW

## 2017-01-01 ENCOUNTER — Telehealth: Payer: Self-pay | Admitting: Nurse Practitioner

## 2017-01-01 ENCOUNTER — Other Ambulatory Visit (HOSPITAL_BASED_OUTPATIENT_CLINIC_OR_DEPARTMENT_OTHER): Payer: Medicare HMO

## 2017-01-01 ENCOUNTER — Ambulatory Visit (HOSPITAL_BASED_OUTPATIENT_CLINIC_OR_DEPARTMENT_OTHER): Payer: Medicare HMO | Admitting: Nurse Practitioner

## 2017-01-01 ENCOUNTER — Telehealth: Payer: Self-pay | Admitting: *Deleted

## 2017-01-01 VITALS — BP 158/74 | HR 86 | Temp 98.6°F | Resp 18 | Ht 63.5 in | Wt 120.6 lb

## 2017-01-01 DIAGNOSIS — D45 Polycythemia vera: Secondary | ICD-10-CM

## 2017-01-01 LAB — CBC WITH DIFFERENTIAL/PLATELET
BASO%: 1.5 % (ref 0.0–2.0)
Basophils Absolute: 0.2 10*3/uL — ABNORMAL HIGH (ref 0.0–0.1)
EOS%: 4.2 % (ref 0.0–7.0)
Eosinophils Absolute: 0.5 10*3/uL (ref 0.0–0.5)
HEMATOCRIT: 30.4 % — AB (ref 34.8–46.6)
HGB: 9.9 g/dL — ABNORMAL LOW (ref 11.6–15.9)
LYMPH#: 0.6 10*3/uL — AB (ref 0.9–3.3)
LYMPH%: 5.6 % — AB (ref 14.0–49.7)
MCH: 28.4 pg (ref 25.1–34.0)
MCHC: 32.4 g/dL (ref 31.5–36.0)
MCV: 87.6 fL (ref 79.5–101.0)
MONO#: 0.5 10*3/uL (ref 0.1–0.9)
MONO%: 4.8 % (ref 0.0–14.0)
NEUT#: 9.6 10*3/uL — ABNORMAL HIGH (ref 1.5–6.5)
NEUT%: 83.9 % — AB (ref 38.4–76.8)
Platelets: 725 10*3/uL — ABNORMAL HIGH (ref 145–400)
RBC: 3.47 10*6/uL — AB (ref 3.70–5.45)
RDW: 26.6 % — ABNORMAL HIGH (ref 11.2–14.5)
WBC: 11.4 10*3/uL — ABNORMAL HIGH (ref 3.9–10.3)

## 2017-01-01 NOTE — Patient Instructions (Signed)
Change Hydrea to 500 mg daily

## 2017-01-01 NOTE — Progress Notes (Signed)
  [facility] OFFICE PROGRESS NOTE   Diagnosis: Polycythemia vera   INTERVAL HISTORY:   [name] returns as scheduled. On 11/27/2016 we instructed her to hold Hydrea. She misunderstood our instructions and has continued 1000 mg daily. She has no complaints today. No nausea or vomiting. No bleeding. No symptoms of thrombosis.  Objective:  Vital signs in last 24 hours:  Blood pressure (!) 158/74, pulse 86, temperature 98.6 F (37 C), temperature source Oral, resp. rate 18, height 5' 3.5" (1.613 m), weight 120 lb 9.6 oz (54.7 kg), SpO2 100 %.    HEENT: No thrush or ulcers. Resp: Lungs clear bilaterally. Cardio: Regular rate and rhythm. GI: Abdomen soft and nontender. No organomegaly. Vascular: No leg edema. Calves soft and nontender.   Lab Results:  Lab Results  Component Value Date   WBC 11.4 (H) 01/01/2017   HGB 9.9 (L) 01/01/2017   HCT 30.4 (L) 01/01/2017   MCV 87.6 01/01/2017   PLT 725 (H) 01/01/2017   NEUTROABS 9.6 (H) 01/01/2017    Imaging:  No results found.  Medications: I have reviewed the patient's current medications.  Assessment/Plan: 1. Polycythemia vera on hydroxyurea. Last phlebotomy 09/08/2015  Hydroxyurea dose increased to 1500 mg daily 06/26/2016  Hydroxyurea changed to 1500 mg daily on Monday through Friday, 1000 mg on Saturday and Sunday beginning 09/11/2016  Hydroxyurea changed to 1500 mg Monday Wednesday and Friday and 1000 mg other days beginning 10/10/2016  Hydroxyurea changed to 1000 mg daily beginning 10/31/2016  Hydroxyurea placed on hold beginning 11/27/2016-she misunderstood our instructions and continued 1000 mg daily.  Hydroxyurea dose decreased to 500 mg daily beginning 01/01/2017 2. Hypertension, followed by Dr. Montez Morita.  3. History of hyperpigmentation at the hands. Question related to hydroxyurea. 4. Hip fracture February 2013. She underwent a bipolar hip arthroplasty on 04/23/2012. 5. Thrombocytosis. Most  likely secondary to polycythemia vera and iron deficiency. 6. Hospitalization with left shoulder cellulitis February 2016. 7. Pelvic fracture after a fall 05/31/2015 8. Weight loss-etiology unclear; weight stable 12/16/2015 and 01/27/2016 9. Right anterior buccal ulcer 03/09/2016   Disposition: Alejandra [name] appears stable. Hemoglobin remains low. She misunderstood our instructions to place Hydrea on hold approximately 1 month ago and continued taking 1000 mg daily. She will decrease hydroxyurea to 500 mg daily and return for a follow-up CBC in 3 weeks. She will return for a follow-up visit in 6 weeks. She will contact the office in the interim with any problems. Signs/symptoms suggestive of progressive anemia reviewed with her at today's visit. She understands to call should she develop any of these.  Plan reviewed with Dr. Benay Spice.    Ned Card ANP/GNP-BC   01/01/2017  10:17 AM

## 2017-01-01 NOTE — Telephone Encounter (Signed)
Gave patient AVS and calender per 5/21 los.  

## 2017-01-01 NOTE — Telephone Encounter (Signed)
Call from Dr. Oralia Rud office. Their fax machine malfunctioned, requested we re-send office note. Same done.

## 2017-01-22 ENCOUNTER — Other Ambulatory Visit (HOSPITAL_BASED_OUTPATIENT_CLINIC_OR_DEPARTMENT_OTHER): Payer: Medicare HMO

## 2017-01-22 DIAGNOSIS — D45 Polycythemia vera: Secondary | ICD-10-CM | POA: Diagnosis not present

## 2017-01-22 LAB — CBC WITH DIFFERENTIAL/PLATELET
BASO%: 1.8 % (ref 0.0–2.0)
BASOS ABS: 0.3 10*3/uL — AB (ref 0.0–0.1)
EOS%: 3 % (ref 0.0–7.0)
Eosinophils Absolute: 0.6 10*3/uL — ABNORMAL HIGH (ref 0.0–0.5)
HCT: 32.1 % — ABNORMAL LOW (ref 34.8–46.6)
HEMOGLOBIN: 10 g/dL — AB (ref 11.6–15.9)
LYMPH#: 1.1 10*3/uL (ref 0.9–3.3)
LYMPH%: 5.6 % — ABNORMAL LOW (ref 14.0–49.7)
MCH: 27.1 pg (ref 25.1–34.0)
MCHC: 31.2 g/dL — ABNORMAL LOW (ref 31.5–36.0)
MCV: 86.6 fL (ref 79.5–101.0)
MONO#: 0.9 10*3/uL (ref 0.1–0.9)
MONO%: 4.7 % (ref 0.0–14.0)
NEUT%: 84.9 % — ABNORMAL HIGH (ref 38.4–76.8)
NEUTROS ABS: 16.7 10*3/uL — AB (ref 1.5–6.5)
NRBC: 0 % (ref 0–0)
PLATELETS: 915 10*3/uL — AB (ref 145–400)
RBC: 3.7 10*6/uL (ref 3.70–5.45)
RDW: 24.5 % — AB (ref 11.2–14.5)
WBC: 19.7 10*3/uL — AB (ref 3.9–10.3)

## 2017-01-22 LAB — TECHNOLOGIST REVIEW

## 2017-01-23 ENCOUNTER — Telehealth: Payer: Self-pay | Admitting: *Deleted

## 2017-01-23 NOTE — Telephone Encounter (Signed)
Telephone call to patients cell phone. No answer and no voicemail. Will attempt to reach patient again later.

## 2017-01-23 NOTE — Telephone Encounter (Signed)
-----   Message from Owens Shark, NP sent at 01/22/2017  4:53 PM EDT ----- Please have her continue hydroxyurea 500 mg daily. Follow-up as scheduled.

## 2017-02-12 ENCOUNTER — Ambulatory Visit (HOSPITAL_BASED_OUTPATIENT_CLINIC_OR_DEPARTMENT_OTHER): Payer: Medicare HMO | Admitting: Oncology

## 2017-02-12 ENCOUNTER — Telehealth: Payer: Self-pay | Admitting: Oncology

## 2017-02-12 ENCOUNTER — Other Ambulatory Visit (HOSPITAL_BASED_OUTPATIENT_CLINIC_OR_DEPARTMENT_OTHER): Payer: Medicare HMO

## 2017-02-12 VITALS — BP 140/86 | HR 72 | Temp 98.0°F | Resp 18 | Ht 63.5 in | Wt 119.6 lb

## 2017-02-12 DIAGNOSIS — D45 Polycythemia vera: Secondary | ICD-10-CM

## 2017-02-12 LAB — CBC WITH DIFFERENTIAL/PLATELET
BASO%: 1.7 % (ref 0.0–2.0)
Basophils Absolute: 0.3 10*3/uL — ABNORMAL HIGH (ref 0.0–0.1)
EOS ABS: 0.9 10*3/uL — AB (ref 0.0–0.5)
EOS%: 4.7 % (ref 0.0–7.0)
HCT: 35.9 % (ref 34.8–46.6)
HGB: 11 g/dL — ABNORMAL LOW (ref 11.6–15.9)
LYMPH%: 8.3 % — AB (ref 14.0–49.7)
MCH: 25.6 pg (ref 25.1–34.0)
MCHC: 30.6 g/dL — AB (ref 31.5–36.0)
MCV: 83.5 fL (ref 79.5–101.0)
MONO#: 1.1 10*3/uL — ABNORMAL HIGH (ref 0.1–0.9)
MONO%: 5.7 % (ref 0.0–14.0)
NEUT%: 79.6 % — ABNORMAL HIGH (ref 38.4–76.8)
NEUTROS ABS: 15.3 10*3/uL — AB (ref 1.5–6.5)
NRBC: 0 % (ref 0–0)
PLATELETS: 878 10*3/uL — AB (ref 145–400)
RBC: 4.3 10*6/uL (ref 3.70–5.45)
RDW: 25 % — AB (ref 11.2–14.5)
WBC: 19.2 10*3/uL — AB (ref 3.9–10.3)
lymph#: 1.6 10*3/uL (ref 0.9–3.3)

## 2017-02-12 LAB — TECHNOLOGIST REVIEW

## 2017-02-12 NOTE — Progress Notes (Signed)
  [facility] OFFICE PROGRESS NOTE   Diagnosis: Polycythemia vera  INTERVAL HISTORY:   Alejandra [name] returns as scheduled. She has been maintained on hydroxyurea at a dose of 500 mg daily for approximately the past 6 weeks. No bleeding or symptom of thrombosis. She continues to have arthritis pain.  Objective:  Vital signs in last 24 hours:  Blood pressure 140/86, pulse 72, temperature 98 F (36.7 C), temperature source Oral, resp. rate 18, height 5' 3.5" (1.613 m), weight 119 lb 9.6 oz (54.3 kg), SpO2 100 %.    HEENT: No thrush or ulcers Resp: Lungs clear bilaterally Cardio: Regular rate and rhythm GI: No hepatomegaly Vascular: No leg edema   Lab Results:  Lab Results  Component Value Date   WBC 19.2 (H) 02/12/2017   HGB 11.0 (L) 02/12/2017   HCT 35.9 02/12/2017   MCV 83.5 02/12/2017   PLT 878 (H) 02/12/2017   NEUTROABS 15.3 (H) 02/12/2017     Medications: I have reviewed the patient's current medications.  Assessment/Plan: 1. Polycythemia vera on hydroxyurea. Last phlebotomy 09/08/2015  Hydroxyurea dose increased to 1500 mg daily 06/26/2016  Hydroxyurea changed to 1500 mg daily on Monday through Friday, 1000 mg on Saturday and Sunday beginning 09/11/2016  Hydroxyurea changed to 1500 mg Monday Wednesday and Friday and 1000 mg other days beginning 10/10/2016  Hydroxyurea changed to 1000 mg daily beginning 10/31/2016  Hydroxyurea placed on hold beginning 11/27/2016-she misunderstood our instructions and continued 1000 mg daily.  Hydroxyurea dose decreased to 500 mg daily beginning 01/01/2017 2. Hypertension, followed by Dr. Montez Morita.  3. History of hyperpigmentation at the hands. Question related to hydroxyurea. 4. Hip fracture February 2013. She underwent a bipolar hip arthroplasty on 04/23/2012. 5. Thrombocytosis. Most likely secondary to polycythemia vera and iron deficiency. 6. Hospitalization with left shoulder cellulitis February  2016. 7. Pelvic fracture after a fall 05/31/2015 8. Weight loss-etiology unclear; weight stable 12/16/2015 and 01/27/2016 9. Right anterior buccal ulcer 03/09/2016     Disposition:  The hemoglobin has increased with the lower hydroxyurea dose and remains in the goal range. She will continue hydroxyurea at a dose of 500 mg daily. [name] will return for a lab visit in 3 weeks and an office visit in 6 weeks.  15 minutes were spent with the patient today. The majority of the time was used for counseling and coordination of care.  Donneta Romberg, MD  02/12/2017  12:20 PM

## 2017-02-12 NOTE — Telephone Encounter (Signed)
Scheduled appt per 7/2 los - Gave patient AVS and calender per los.  

## 2017-02-27 DIAGNOSIS — M545 Low back pain: Secondary | ICD-10-CM | POA: Diagnosis not present

## 2017-02-27 DIAGNOSIS — M7061 Trochanteric bursitis, right hip: Secondary | ICD-10-CM | POA: Diagnosis not present

## 2017-02-27 DIAGNOSIS — M48061 Spinal stenosis, lumbar region without neurogenic claudication: Secondary | ICD-10-CM | POA: Diagnosis not present

## 2017-02-27 DIAGNOSIS — M47816 Spondylosis without myelopathy or radiculopathy, lumbar region: Secondary | ICD-10-CM | POA: Diagnosis not present

## 2017-03-05 ENCOUNTER — Other Ambulatory Visit (HOSPITAL_BASED_OUTPATIENT_CLINIC_OR_DEPARTMENT_OTHER): Payer: Medicare HMO

## 2017-03-05 DIAGNOSIS — D45 Polycythemia vera: Secondary | ICD-10-CM | POA: Diagnosis not present

## 2017-03-05 LAB — CBC WITH DIFFERENTIAL/PLATELET
BASO%: 1.5 % (ref 0.0–2.0)
Basophils Absolute: 0.5 10*3/uL — ABNORMAL HIGH (ref 0.0–0.1)
EOS%: 3.7 % (ref 0.0–7.0)
Eosinophils Absolute: 1.2 10*3/uL — ABNORMAL HIGH (ref 0.0–0.5)
HCT: 37.7 % (ref 34.8–46.6)
HEMOGLOBIN: 11.7 g/dL (ref 11.6–15.9)
LYMPH%: 4 % — AB (ref 14.0–49.7)
MCH: 25.3 pg (ref 25.1–34.0)
MCHC: 31 g/dL — ABNORMAL LOW (ref 31.5–36.0)
MCV: 81.5 fL (ref 79.5–101.0)
MONO#: 1 10*3/uL — ABNORMAL HIGH (ref 0.1–0.9)
MONO%: 3.2 % (ref 0.0–14.0)
NEUT%: 87.6 % — ABNORMAL HIGH (ref 38.4–76.8)
NEUTROS ABS: 28.1 10*3/uL — AB (ref 1.5–6.5)
Platelets: 1050 10*3/uL — ABNORMAL HIGH (ref 145–400)
RBC: 4.62 10*6/uL (ref 3.70–5.45)
RDW: 25.3 % — ABNORMAL HIGH (ref 11.2–14.5)
WBC: 32.1 10*3/uL — AB (ref 3.9–10.3)
lymph#: 1.3 10*3/uL (ref 0.9–3.3)

## 2017-03-05 LAB — COMPREHENSIVE METABOLIC PANEL
ALBUMIN: 4 g/dL (ref 3.5–5.0)
ALK PHOS: 119 U/L (ref 40–150)
ALT: 16 U/L (ref 0–55)
ANION GAP: 8 meq/L (ref 3–11)
AST: 22 U/L (ref 5–34)
BILIRUBIN TOTAL: 0.41 mg/dL (ref 0.20–1.20)
BUN: 22.4 mg/dL (ref 7.0–26.0)
CO2: 25 mEq/L (ref 22–29)
Calcium: 9.9 mg/dL (ref 8.4–10.4)
Chloride: 110 mEq/L — ABNORMAL HIGH (ref 98–109)
Creatinine: 1 mg/dL (ref 0.6–1.1)
EGFR: 62 mL/min/{1.73_m2} — AB (ref >90)
Glucose: 79 mg/dl (ref 70–140)
Potassium: 4.3 mEq/L (ref 3.5–5.1)
Sodium: 143 mEq/L (ref 136–145)
TOTAL PROTEIN: 8.1 g/dL (ref 6.4–8.3)

## 2017-03-05 LAB — TECHNOLOGIST REVIEW

## 2017-03-06 DIAGNOSIS — N183 Chronic kidney disease, stage 3 (moderate): Secondary | ICD-10-CM | POA: Diagnosis not present

## 2017-03-06 DIAGNOSIS — S32501A Unspecified fracture of right pubis, initial encounter for closed fracture: Secondary | ICD-10-CM | POA: Diagnosis not present

## 2017-03-06 DIAGNOSIS — Z9181 History of falling: Secondary | ICD-10-CM | POA: Diagnosis not present

## 2017-03-06 DIAGNOSIS — M159 Polyosteoarthritis, unspecified: Secondary | ICD-10-CM | POA: Diagnosis not present

## 2017-03-06 DIAGNOSIS — Z79899 Other long term (current) drug therapy: Secondary | ICD-10-CM | POA: Diagnosis not present

## 2017-03-06 DIAGNOSIS — Z0001 Encounter for general adult medical examination with abnormal findings: Secondary | ICD-10-CM | POA: Diagnosis not present

## 2017-03-06 DIAGNOSIS — D751 Secondary polycythemia: Secondary | ICD-10-CM | POA: Diagnosis not present

## 2017-03-06 DIAGNOSIS — M2550 Pain in unspecified joint: Secondary | ICD-10-CM | POA: Diagnosis not present

## 2017-03-06 DIAGNOSIS — R350 Frequency of micturition: Secondary | ICD-10-CM | POA: Diagnosis not present

## 2017-03-06 DIAGNOSIS — I119 Hypertensive heart disease without heart failure: Secondary | ICD-10-CM | POA: Diagnosis not present

## 2017-03-07 ENCOUNTER — Telehealth: Payer: Self-pay | Admitting: *Deleted

## 2017-03-07 NOTE — Telephone Encounter (Signed)
-----   Message from Ladell Pier, MD sent at 03/06/2017  7:34 AM EDT ----- Please call patient, hb ok, continue hydrea-same dose

## 2017-03-07 NOTE — Telephone Encounter (Signed)
Pt returned call, informed her of lab result, per MD note below. She voiced understanding. Next appt confirmed.

## 2017-03-07 NOTE — Telephone Encounter (Signed)
Telephone call to patient- Left message to return call to discuss lab results.

## 2017-03-26 ENCOUNTER — Other Ambulatory Visit (HOSPITAL_BASED_OUTPATIENT_CLINIC_OR_DEPARTMENT_OTHER): Payer: Medicare HMO

## 2017-03-26 ENCOUNTER — Ambulatory Visit (HOSPITAL_BASED_OUTPATIENT_CLINIC_OR_DEPARTMENT_OTHER): Payer: Medicare HMO | Admitting: Nurse Practitioner

## 2017-03-26 ENCOUNTER — Telehealth: Payer: Self-pay | Admitting: Nurse Practitioner

## 2017-03-26 VITALS — BP 147/78 | HR 71 | Temp 97.7°F | Resp 16 | Ht 63.5 in | Wt 123.9 lb

## 2017-03-26 DIAGNOSIS — D45 Polycythemia vera: Secondary | ICD-10-CM

## 2017-03-26 LAB — TECHNOLOGIST REVIEW

## 2017-03-26 LAB — CBC WITH DIFFERENTIAL/PLATELET
BASO%: 2.8 % — ABNORMAL HIGH (ref 0.0–2.0)
Basophils Absolute: 0.7 10*3/uL — ABNORMAL HIGH (ref 0.0–0.1)
EOS%: 4.2 % (ref 0.0–7.0)
Eosinophils Absolute: 1.1 10*3/uL — ABNORMAL HIGH (ref 0.0–0.5)
HCT: 37.7 % (ref 34.8–46.6)
HGB: 11.8 g/dL (ref 11.6–15.9)
LYMPH%: 6.8 % — ABNORMAL LOW (ref 14.0–49.7)
MCH: 24.6 pg — ABNORMAL LOW (ref 25.1–34.0)
MCHC: 31.3 g/dL — ABNORMAL LOW (ref 31.5–36.0)
MCV: 78.5 fL — ABNORMAL LOW (ref 79.5–101.0)
MONO#: 1 10*3/uL — ABNORMAL HIGH (ref 0.1–0.9)
MONO%: 3.9 % (ref 0.0–14.0)
NEUT#: 21.2 10*3/uL — ABNORMAL HIGH (ref 1.5–6.5)
NEUT%: 82.3 % — ABNORMAL HIGH (ref 38.4–76.8)
Platelets: 986 10*3/uL — ABNORMAL HIGH (ref 145–400)
RBC: 4.81 10*6/uL (ref 3.70–5.45)
RDW: 26.6 % — ABNORMAL HIGH (ref 11.2–14.5)
WBC: 25.8 10*3/uL — ABNORMAL HIGH (ref 3.9–10.3)
lymph#: 1.7 10*3/uL (ref 0.9–3.3)

## 2017-03-26 NOTE — Telephone Encounter (Signed)
Scheduled appt per 8/13 los - Gave patient AVS and calender per los.  

## 2017-03-26 NOTE — Progress Notes (Signed)
  [facility] OFFICE PROGRESS NOTE   Diagnosis:  Polycythemia vera  INTERVAL HISTORY:   Alejandra [name] returns as scheduled. She continues hydroxyurea at a dose of 500 mg daily. She denies nausea. No mouth sores. No diarrhea. No rash. No signs of thrombosis. No bleeding.  Objective:  Vital signs in last 24 hours:  Blood pressure (!) 147/78, pulse 71, temperature 97.7 F (36.5 C), temperature source Oral, resp. rate 16, height 5' 3.5" (1.613 m), weight 123 lb 14.4 oz (56.2 kg), SpO2 100 %.    HEENT: No thrush or ulcers. Resp: Lungs clear bilaterally. Cardio: Regular rate and rhythm. GI: No hepatosplenomegaly. Vascular: No leg edema.   Lab Results:  Lab Results  Component Value Date   WBC 32.1 (H) 03/05/2017   HGB 11.7 03/05/2017   HCT 37.7 03/05/2017   MCV 81.5 03/05/2017   PLT 1,050 (H) 03/05/2017   NEUTROABS 28.1 (H) 03/05/2017    Imaging:  No results found.  Medications: I have reviewed the patient's current medications.  Assessment/Plan: 1. Polycythemia vera on hydroxyurea. Last phlebotomy 09/08/2015  Hydroxyurea dose increased to 1500 mg daily 06/26/2016  Hydroxyurea changed to 1500 mg daily on Monday through Friday, 1000 mg on Saturday and Sunday beginning 09/11/2016  Hydroxyurea changed to 1500 mg Monday Wednesday and Friday and 1000 mg other days beginning 10/10/2016  Hydroxyurea changed to 1000 mg daily beginning 10/31/2016  Hydroxyurea placed on hold beginning 11/27/2016-she misunderstood our instructions and continued 1000 mg daily.  Hydroxyurea dose decreased to 500 mg daily beginning 01/01/2017 2. Hypertension, followed by Dr. Montez Morita.  3. History of hyperpigmentation at the hands. Question related to hydroxyurea. 4. Hip fracture February 2013. She underwent a bipolar hip arthroplasty on 04/23/2012. 5. Thrombocytosis. Most likely secondary to polycythemia vera and iron deficiency. 6. Hospitalization with left shoulder cellulitis  February 2016. 7. Pelvic fracture after a fall 05/31/2015 8. Weight loss-etiology unclear; weight stable 12/16/2015 and06/15/2017 9. Right anterior buccal ulcer 03/09/2016    Disposition:Alejandra [name] is stable from a hematologic standpoint. The hemoglobin remains in goal range. She will continue hydroxyurea 500 mg daily. She will return for a CBC in 6 weeks and office visit in 12 weeks. She will contact the office in the interim with any problems.  Plan reviewed with Dr. Benay Spice.    Ned Card ANP/GNP-BC   03/26/2017  12:10 PM

## 2017-05-07 ENCOUNTER — Other Ambulatory Visit (HOSPITAL_BASED_OUTPATIENT_CLINIC_OR_DEPARTMENT_OTHER): Payer: Medicare HMO

## 2017-05-07 DIAGNOSIS — D45 Polycythemia vera: Secondary | ICD-10-CM

## 2017-05-07 LAB — CBC WITH DIFFERENTIAL/PLATELET
BASO%: 0.4 % (ref 0.0–2.0)
BASOS ABS: 0.2 10*3/uL — AB (ref 0.0–0.1)
EOS ABS: 1.9 10*3/uL — AB (ref 0.0–0.5)
EOS%: 4.7 % (ref 0.0–7.0)
HEMATOCRIT: 45.2 % (ref 34.8–46.6)
HEMOGLOBIN: 13.5 g/dL (ref 11.6–15.9)
LYMPH%: 5.6 % — ABNORMAL LOW (ref 14.0–49.7)
MCH: 21.9 pg — AB (ref 25.1–34.0)
MCHC: 30 g/dL — AB (ref 31.5–36.0)
MCV: 73.2 fL — AB (ref 79.5–101.0)
MONO#: 1.4 10*3/uL — ABNORMAL HIGH (ref 0.1–0.9)
MONO%: 3.4 % (ref 0.0–14.0)
NEUT#: 35.4 10*3/uL — ABNORMAL HIGH (ref 1.5–6.5)
NEUT%: 85.9 % — AB (ref 38.4–76.8)
Platelets: 1245 10*3/uL — ABNORMAL HIGH (ref 145–400)
RBC: 6.18 10*6/uL — ABNORMAL HIGH (ref 3.70–5.45)
RDW: 27.8 % — AB (ref 11.2–14.5)
WBC: 41.1 10*3/uL — ABNORMAL HIGH (ref 3.9–10.3)
lymph#: 2.3 10*3/uL (ref 0.9–3.3)

## 2017-05-07 LAB — TECHNOLOGIST REVIEW

## 2017-05-08 ENCOUNTER — Telehealth: Payer: Self-pay | Admitting: *Deleted

## 2017-05-08 ENCOUNTER — Other Ambulatory Visit: Payer: Self-pay | Admitting: Nurse Practitioner

## 2017-05-08 ENCOUNTER — Telehealth: Payer: Self-pay | Admitting: Nurse Practitioner

## 2017-05-08 DIAGNOSIS — D45 Polycythemia vera: Secondary | ICD-10-CM

## 2017-05-08 NOTE — Telephone Encounter (Signed)
Scheduled appt per 9/25 sch msg. Spoke with patient regarding this appt.

## 2017-05-08 NOTE — Telephone Encounter (Signed)
Telephone call to patient- instructed dose changes as directed below. Patient repeated back and verbalized an understanding. She understands to call this office with any concerns or questions.

## 2017-05-08 NOTE — Telephone Encounter (Signed)
-----   Message from Owens Shark, NP sent at 05/08/2017  8:31 AM EDT ----- Please instruct her to increase the hydroxyurea to 500mg  daily except 1000 mg Mon, Thur  Check cbc 1 month

## 2017-06-04 ENCOUNTER — Other Ambulatory Visit (HOSPITAL_BASED_OUTPATIENT_CLINIC_OR_DEPARTMENT_OTHER): Payer: Medicare HMO

## 2017-06-04 DIAGNOSIS — D45 Polycythemia vera: Secondary | ICD-10-CM | POA: Diagnosis not present

## 2017-06-04 LAB — CBC WITH DIFFERENTIAL/PLATELET
BASO%: 3 % — AB (ref 0.0–2.0)
BASOS ABS: 1 10*3/uL — AB (ref 0.0–0.1)
EOS%: 4.3 % (ref 0.0–7.0)
Eosinophils Absolute: 1.5 10*3/uL — ABNORMAL HIGH (ref 0.0–0.5)
HCT: 45.3 % (ref 34.8–46.6)
HEMOGLOBIN: 13.5 g/dL (ref 11.6–15.9)
LYMPH#: 1.6 10*3/uL (ref 0.9–3.3)
LYMPH%: 4.8 % — ABNORMAL LOW (ref 14.0–49.7)
MCH: 20.9 pg — AB (ref 25.1–34.0)
MCHC: 29.7 g/dL — AB (ref 31.5–36.0)
MCV: 70.2 fL — ABNORMAL LOW (ref 79.5–101.0)
MONO#: 1.2 10*3/uL — ABNORMAL HIGH (ref 0.1–0.9)
MONO%: 3.7 % (ref 0.0–14.0)
NEUT#: 28.7 10*3/uL — ABNORMAL HIGH (ref 1.5–6.5)
NEUT%: 84.2 % — AB (ref 38.4–76.8)
Platelets: 989 10*3/uL — ABNORMAL HIGH (ref 145–400)
RBC: 6.45 10*6/uL — ABNORMAL HIGH (ref 3.70–5.45)
RDW: 27.6 % — AB (ref 11.2–14.5)
WBC: 34.1 10*3/uL — ABNORMAL HIGH (ref 3.9–10.3)

## 2017-06-05 ENCOUNTER — Telehealth: Payer: Self-pay | Admitting: *Deleted

## 2017-06-05 NOTE — Telephone Encounter (Signed)
Telephone call to patient. Advised lab results and new directions for hydrea. Patient verbalized an understanding as well as knowing to call this office with any concerns or questions.

## 2017-06-05 NOTE — Telephone Encounter (Signed)
-----   Message from Owens Shark, NP sent at 06/05/2017  8:43 AM EDT ----- Change Hydrea to 1000mg  m,w,f and 500mg  other days, f/u as scheduled

## 2017-06-07 DIAGNOSIS — Z8249 Family history of ischemic heart disease and other diseases of the circulatory system: Secondary | ICD-10-CM | POA: Diagnosis not present

## 2017-06-07 DIAGNOSIS — M2550 Pain in unspecified joint: Secondary | ICD-10-CM | POA: Diagnosis not present

## 2017-06-07 DIAGNOSIS — N182 Chronic kidney disease, stage 2 (mild): Secondary | ICD-10-CM | POA: Diagnosis not present

## 2017-06-07 DIAGNOSIS — Z79899 Other long term (current) drug therapy: Secondary | ICD-10-CM | POA: Diagnosis not present

## 2017-06-07 DIAGNOSIS — S32501A Unspecified fracture of right pubis, initial encounter for closed fracture: Secondary | ICD-10-CM | POA: Diagnosis not present

## 2017-06-07 DIAGNOSIS — D45 Polycythemia vera: Secondary | ICD-10-CM | POA: Diagnosis not present

## 2017-06-07 DIAGNOSIS — M159 Polyosteoarthritis, unspecified: Secondary | ICD-10-CM | POA: Diagnosis not present

## 2017-06-07 DIAGNOSIS — R350 Frequency of micturition: Secondary | ICD-10-CM | POA: Diagnosis not present

## 2017-06-07 DIAGNOSIS — N951 Menopausal and female climacteric states: Secondary | ICD-10-CM | POA: Diagnosis not present

## 2017-06-07 DIAGNOSIS — I119 Hypertensive heart disease without heart failure: Secondary | ICD-10-CM | POA: Diagnosis not present

## 2017-06-18 ENCOUNTER — Other Ambulatory Visit (HOSPITAL_BASED_OUTPATIENT_CLINIC_OR_DEPARTMENT_OTHER): Payer: Medicare HMO

## 2017-06-18 ENCOUNTER — Ambulatory Visit: Payer: Medicare HMO | Admitting: Oncology

## 2017-06-18 VITALS — BP 157/95 | HR 68 | Temp 97.6°F | Resp 17 | Ht 63.25 in | Wt 121.4 lb

## 2017-06-18 DIAGNOSIS — D45 Polycythemia vera: Secondary | ICD-10-CM | POA: Diagnosis not present

## 2017-06-18 LAB — CBC WITH DIFFERENTIAL/PLATELET
BASO%: 1.1 % (ref 0.0–2.0)
Basophils Absolute: 0.3 10*3/uL — ABNORMAL HIGH (ref 0.0–0.1)
EOS%: 5.2 % (ref 0.0–7.0)
Eosinophils Absolute: 1.4 10*3/uL — ABNORMAL HIGH (ref 0.0–0.5)
HCT: 44.1 % (ref 34.8–46.6)
HGB: 13.1 g/dL (ref 11.6–15.9)
LYMPH%: 6.4 % — AB (ref 14.0–49.7)
MCH: 20.3 pg — ABNORMAL LOW (ref 25.1–34.0)
MCHC: 29.8 g/dL — ABNORMAL LOW (ref 31.5–36.0)
MCV: 68.1 fL — ABNORMAL LOW (ref 79.5–101.0)
MONO#: 0.7 10*3/uL (ref 0.1–0.9)
MONO%: 2.6 % (ref 0.0–14.0)
NEUT%: 84.7 % — AB (ref 38.4–76.8)
NEUTROS ABS: 23.3 10*3/uL — AB (ref 1.5–6.5)
PLATELETS: 976 10*3/uL — AB (ref 145–400)
RBC: 6.48 10*6/uL — AB (ref 3.70–5.45)
RDW: 27 % — ABNORMAL HIGH (ref 11.2–14.5)
WBC: 27.5 10*3/uL — AB (ref 3.9–10.3)
lymph#: 1.8 10*3/uL (ref 0.9–3.3)

## 2017-06-18 NOTE — Progress Notes (Signed)
  [facility] OFFICE PROGRESS NOTE   Diagnosis:  Polycythemia vera  INTERVAL HISTORY:   Alejandra [name] returns as scheduled. She feels well. No bleeding or symptom of thrombosis. The hydroxyurea dose was increased to 1000 mg on Monday, Wednesday, and Friday beginning 06/05/2017.   Objective:  Vital signs in last 24 hours:  Blood pressure (!) 157/95, pulse 68, temperature 97.6 F (36.4 C), temperature source Oral, resp. rate 17, height 5' 3.25" (1.607 m), weight 121 lb 6.4 oz (55.1 kg), SpO2 100 %.    HEENT: No thrush or ulcers Resp: Lungs clear bilaterally Cardio: Regular rate and rhythm GI: No hepatosplenomegaly Vascular: No leg edema   Lab Results:  Lab Results  Component Value Date   WBC 27.5 (H) 06/18/2017   HGB 13.1 06/18/2017   HCT 44.1 06/18/2017   MCV 68.1 (L) 06/18/2017   PLT 976 (H) 06/18/2017   NEUTROABS 23.3 (H) 06/18/2017     Medications: I have reviewed the patient's current medications.  Assessment/Plan: 1. Polycythemia vera on hydroxyurea. Last phlebotomy 09/08/2015  Hydroxyurea dose increased to 1500 mg daily 06/26/2016  Hydroxyurea changed to 1500 mg daily on Monday through Friday, 1000 mg on Saturday and Sunday beginning 09/11/2016  Hydroxyurea changed to 1500 mg Monday Wednesday and Friday and 1000 mg other days beginning 10/10/2016  Hydroxyurea changed to 1000 mg daily beginning 10/31/2016  Hydroxyurea placed on hold beginning 11/27/2016-she misunderstood our instructions and continued 1000 mg daily.  Hydroxyurea dose decreased to 500 mg daily beginning 01/01/2017  Hydroxyurea dose increased 06/05/2017 2. Hypertension, followed by Dr. Montez Morita.  3. History of hyperpigmentation at the hands. Question related to hydroxyurea. 4. Hip fracture February 2013. She underwent a bipolar hip arthroplasty on 04/23/2012. 5. Thrombocytosis. Most likely secondary to polycythemia vera and iron deficiency. 6. Hospitalization with left shoulder  cellulitis February 2016. 7. Pelvic fracture after a fall 05/31/2015 8. Weight loss-etiology unclear; weight stable 12/16/2015 and06/15/2017 9. Right anterior buccal ulcer 03/09/2016  Disposition:  Alejandra [name] appears unchanged. She will continue hydroxyurea at the current dose. The hemoglobin/hematocrit remain borderline elevated. We will initiate phlebotomy therapy if the hemoglobin rises. She will return for a lab visit in 4 weeks and an office visit in 2 months.  15 minutes were spent with the patient today. The majority of the time was used for counseling and coordination of care.  Donneta Romberg, MD  06/18/2017  12:00 PM

## 2017-06-19 ENCOUNTER — Telehealth: Payer: Self-pay | Admitting: Oncology

## 2017-06-19 NOTE — Telephone Encounter (Signed)
Scheduled appt per 11/5 sch message - patient is aware of appt date and time.  

## 2017-07-12 DIAGNOSIS — J309 Allergic rhinitis, unspecified: Secondary | ICD-10-CM | POA: Diagnosis not present

## 2017-07-12 DIAGNOSIS — D473 Essential (hemorrhagic) thrombocythemia: Secondary | ICD-10-CM | POA: Diagnosis not present

## 2017-07-12 DIAGNOSIS — H8110 Benign paroxysmal vertigo, unspecified ear: Secondary | ICD-10-CM | POA: Diagnosis not present

## 2017-07-12 DIAGNOSIS — E785 Hyperlipidemia, unspecified: Secondary | ICD-10-CM | POA: Diagnosis not present

## 2017-07-12 DIAGNOSIS — H259 Unspecified age-related cataract: Secondary | ICD-10-CM | POA: Diagnosis not present

## 2017-07-12 DIAGNOSIS — M62838 Other muscle spasm: Secondary | ICD-10-CM | POA: Diagnosis not present

## 2017-07-12 DIAGNOSIS — Z0000 Encounter for general adult medical examination without abnormal findings: Secondary | ICD-10-CM | POA: Diagnosis not present

## 2017-07-12 DIAGNOSIS — H409 Unspecified glaucoma: Secondary | ICD-10-CM | POA: Diagnosis not present

## 2017-07-12 DIAGNOSIS — I10 Essential (primary) hypertension: Secondary | ICD-10-CM | POA: Diagnosis not present

## 2017-07-12 DIAGNOSIS — D45 Polycythemia vera: Secondary | ICD-10-CM | POA: Diagnosis not present

## 2017-07-18 ENCOUNTER — Other Ambulatory Visit: Payer: Medicare HMO

## 2017-08-20 ENCOUNTER — Ambulatory Visit: Payer: Medicare HMO | Admitting: Nurse Practitioner

## 2017-08-20 ENCOUNTER — Other Ambulatory Visit: Payer: Medicare HMO

## 2017-09-25 DIAGNOSIS — H11153 Pinguecula, bilateral: Secondary | ICD-10-CM | POA: Diagnosis not present

## 2017-09-25 DIAGNOSIS — H401133 Primary open-angle glaucoma, bilateral, severe stage: Secondary | ICD-10-CM | POA: Diagnosis not present

## 2017-09-25 DIAGNOSIS — H25013 Cortical age-related cataract, bilateral: Secondary | ICD-10-CM | POA: Diagnosis not present

## 2017-09-25 DIAGNOSIS — H2513 Age-related nuclear cataract, bilateral: Secondary | ICD-10-CM | POA: Diagnosis not present

## 2017-10-02 DIAGNOSIS — M25551 Pain in right hip: Secondary | ICD-10-CM | POA: Diagnosis not present

## 2017-10-02 DIAGNOSIS — M619 Calcification and ossification of muscle, unspecified: Secondary | ICD-10-CM | POA: Diagnosis not present

## 2017-10-02 DIAGNOSIS — M7061 Trochanteric bursitis, right hip: Secondary | ICD-10-CM | POA: Diagnosis not present

## 2017-10-04 DIAGNOSIS — D45 Polycythemia vera: Secondary | ICD-10-CM | POA: Diagnosis not present

## 2017-10-04 DIAGNOSIS — Z79899 Other long term (current) drug therapy: Secondary | ICD-10-CM | POA: Diagnosis not present

## 2017-10-04 DIAGNOSIS — Z780 Asymptomatic menopausal state: Secondary | ICD-10-CM | POA: Diagnosis not present

## 2017-10-04 DIAGNOSIS — R7302 Impaired glucose tolerance (oral): Secondary | ICD-10-CM | POA: Diagnosis not present

## 2017-10-04 DIAGNOSIS — I119 Hypertensive heart disease without heart failure: Secondary | ICD-10-CM | POA: Diagnosis not present

## 2017-10-04 DIAGNOSIS — N182 Chronic kidney disease, stage 2 (mild): Secondary | ICD-10-CM | POA: Diagnosis not present

## 2017-10-04 DIAGNOSIS — Z8249 Family history of ischemic heart disease and other diseases of the circulatory system: Secondary | ICD-10-CM | POA: Diagnosis not present

## 2017-10-04 DIAGNOSIS — R69 Illness, unspecified: Secondary | ICD-10-CM | POA: Diagnosis not present

## 2017-10-04 DIAGNOSIS — M159 Polyosteoarthritis, unspecified: Secondary | ICD-10-CM | POA: Diagnosis not present

## 2017-10-04 DIAGNOSIS — S32501A Unspecified fracture of right pubis, initial encounter for closed fracture: Secondary | ICD-10-CM | POA: Diagnosis not present

## 2017-10-05 DIAGNOSIS — H401133 Primary open-angle glaucoma, bilateral, severe stage: Secondary | ICD-10-CM | POA: Diagnosis not present

## 2017-10-08 ENCOUNTER — Telehealth: Payer: Self-pay

## 2017-10-08 NOTE — Telephone Encounter (Signed)
Patient called and rescheduled appointment for tomorrow for 3/21 she said that she ha another appointment and was unable to come. Per 2/25 phone message return.

## 2017-10-09 ENCOUNTER — Ambulatory Visit: Payer: Medicare HMO | Admitting: Oncology

## 2017-10-09 ENCOUNTER — Other Ambulatory Visit: Payer: Medicare HMO

## 2017-10-12 DIAGNOSIS — M7061 Trochanteric bursitis, right hip: Secondary | ICD-10-CM | POA: Diagnosis not present

## 2017-10-23 DIAGNOSIS — M25551 Pain in right hip: Secondary | ICD-10-CM | POA: Diagnosis not present

## 2017-10-31 ENCOUNTER — Other Ambulatory Visit: Payer: Self-pay | Admitting: Pulmonary Disease

## 2017-10-31 DIAGNOSIS — Z1231 Encounter for screening mammogram for malignant neoplasm of breast: Secondary | ICD-10-CM

## 2017-11-01 ENCOUNTER — Inpatient Hospital Stay (HOSPITAL_BASED_OUTPATIENT_CLINIC_OR_DEPARTMENT_OTHER): Payer: Medicare HMO | Admitting: Oncology

## 2017-11-01 ENCOUNTER — Inpatient Hospital Stay: Payer: Medicare HMO | Attending: Oncology

## 2017-11-01 VITALS — BP 180/92 | HR 67 | Temp 97.8°F | Resp 19 | Ht 63.25 in | Wt 118.0 lb

## 2017-11-01 DIAGNOSIS — D45 Polycythemia vera: Secondary | ICD-10-CM

## 2017-11-01 DIAGNOSIS — Z79899 Other long term (current) drug therapy: Secondary | ICD-10-CM | POA: Insufficient documentation

## 2017-11-01 DIAGNOSIS — M25559 Pain in unspecified hip: Secondary | ICD-10-CM | POA: Diagnosis not present

## 2017-11-01 DIAGNOSIS — I10 Essential (primary) hypertension: Secondary | ICD-10-CM | POA: Insufficient documentation

## 2017-11-01 LAB — CBC WITH DIFFERENTIAL/PLATELET
Basophils Absolute: 0.7 10*3/uL — ABNORMAL HIGH (ref 0.0–0.1)
Basophils Relative: 3 %
Eosinophils Absolute: 1.1 10*3/uL — ABNORMAL HIGH (ref 0.0–0.5)
Eosinophils Relative: 4 %
HEMATOCRIT: 44.5 % (ref 34.8–46.6)
HEMOGLOBIN: 12.7 g/dL (ref 11.6–15.9)
LYMPHS ABS: 1.4 10*3/uL (ref 0.9–3.3)
LYMPHS PCT: 6 %
MCH: 18 pg — AB (ref 25.1–34.0)
MCHC: 28.6 g/dL — ABNORMAL LOW (ref 31.5–36.0)
MCV: 62.9 fL — AB (ref 79.5–101.0)
MONOS PCT: 2 %
Monocytes Absolute: 0.5 10*3/uL (ref 0.1–0.9)
NEUTROS ABS: 20.9 10*3/uL — AB (ref 1.5–6.5)
NEUTROS PCT: 85 %
Platelets: 1283 10*3/uL — ABNORMAL HIGH (ref 145–400)
RBC: 7.08 MIL/uL — AB (ref 3.70–5.45)
RDW: 27.3 % — ABNORMAL HIGH (ref 11.2–14.5)
WBC: 24.6 10*3/uL — ABNORMAL HIGH (ref 3.9–10.3)

## 2017-11-01 MED ORDER — HYDROXYUREA 500 MG PO CAPS: ORAL_CAPSULE | ORAL | 0 refills | Status: DC

## 2017-11-01 NOTE — Progress Notes (Signed)
  Alejandra [name]   Diagnosis: Polycythemia vera  INTERVAL HISTORY:   Alejandra [name] returns as scheduled.  She continues to have hip discomfort and reports orthopedics is considering a hip replacement procedure.  She continues hydroxyurea.  No symptom of thrombosis.  No bleeding.  She has a "bad tooth ".  Objective:  Vital signs in last 24 hours:  Blood pressure (!) 180/92, pulse 67, temperature 97.8 F (36.6 C), temperature source Oral, resp. rate 19, height 5' 3.25" (1.607 m), weight 118 lb (53.5 kg), SpO2 100 %.    HEENT: No buccal or tongue ulcers.  There periodontal disease surrounding several right lower teeth. Resp: Lungs clear bilaterally Cardio: Regular rate and rhythm GI: No hepatosplenomegaly Vascular: No leg edema   Lab Results:  Lab Results  Component Value Date   WBC 24.6 (H) 11/01/2017   HGB 12.7 11/01/2017   HCT 44.5 11/01/2017   MCV 62.9 (L) 11/01/2017   PLT 1,283 (H) 11/01/2017   NEUTROABS 20.9 (H) 11/01/2017    CMP     Component Value Date/Time   NA 143 03/05/2017 1153   K 4.3 03/05/2017 1153   CL 107 11/07/2015 1301   CL 108 (H) 12/30/2012 1059   CO2 25 03/05/2017 1153   GLUCOSE 79 03/05/2017 1153   GLUCOSE 74 12/30/2012 1059   BUN 22.4 03/05/2017 1153   CREATININE 1.0 03/05/2017 1153   CALCIUM 9.9 03/05/2017 1153   PROT 8.1 03/05/2017 1153   ALBUMIN 4.0 03/05/2017 1153   AST 22 03/05/2017 1153   ALT 16 03/05/2017 1153   ALKPHOS 119 03/05/2017 1153   BILITOT 0.41 03/05/2017 1153   GFRNONAA >60 11/07/2015 1301   GFRAA >60 11/07/2015 1301    Medications: I have reviewed the patient's current medications.   Assessment/Plan: 1. Polycythemia vera on hydroxyurea. Last phlebotomy 09/08/2015  Hydroxyurea dose increased to 1500 mg daily 06/26/2016  Hydroxyurea changed to 1500 mg daily on Monday through Friday, 1000 mg on Saturday and Sunday beginning 09/11/2016  Hydroxyurea changed to 1500 mg Monday Wednesday  and Friday and 1000 mg other days beginning 10/10/2016  Hydroxyurea changed to 1000 mg daily beginning 10/31/2016  Hydroxyurea placed on hold beginning 11/27/2016-she misunderstood our instructions and continued 1000 mg daily.  Hydroxyurea dose decreased to 500 mg daily beginning 01/01/2017  Hydroxyurea dose increased 06/05/2017  Hydroxyurea dose increased to 1000 mg daily except for 500 mg on Monday, Wednesday, and Friday, 11/01/2017 2. Hypertension, followed by Dr. Montez Morita.  3. History of hyperpigmentation at the hands. Question related to hydroxyurea. 4. Hip fracture February 2013. She underwent a bipolar hip arthroplasty on 04/23/2012. 5. Thrombocytosis. Most likely secondary to polycythemia vera and iron deficiency. 6. Hospitalization with left shoulder cellulitis February 2016. 7. Pelvic fracture after a fall 05/31/2015 8. Weight loss-etiology unclear; weight stable 12/16/2015 and06/15/2017 9. Right anterior buccal ulcer 03/09/2016    Disposition: Alejandra [name] appears well.  The hemoglobin/hematocrit and platelet count remain elevated.  We increase the hydroxyurea dose today.  The plan is to slowly titrate the hydroxyurea dose upward if tolerated.  She will return for a CBC in 1 month and an office visit in 2 months. I recommended she schedule an appointment with her dentist to evaluate the right lower tooth/gum findings.    Betsy Coder, MD  11/01/2017  11:41 AM

## 2017-11-27 ENCOUNTER — Encounter (HOSPITAL_COMMUNITY): Payer: Self-pay | Admitting: Emergency Medicine

## 2017-11-27 ENCOUNTER — Emergency Department (HOSPITAL_COMMUNITY)
Admission: EM | Admit: 2017-11-27 | Discharge: 2017-11-27 | Disposition: A | Discharge status: Home / Self Care | Payer: Medicare HMO | Attending: Emergency Medicine | Admitting: Emergency Medicine

## 2017-11-27 ENCOUNTER — Ambulatory Visit
Admission: RE | Admit: 2017-11-27 | Discharge: 2017-11-27 | Disposition: A | Discharge status: Home / Self Care | Payer: Medicare HMO | Source: Ambulatory Visit | Attending: Pulmonary Disease | Admitting: Pulmonary Disease

## 2017-11-27 DIAGNOSIS — Z79899 Other long term (current) drug therapy: Secondary | ICD-10-CM | POA: Diagnosis not present

## 2017-11-27 DIAGNOSIS — I10 Essential (primary) hypertension: Secondary | ICD-10-CM | POA: Diagnosis not present

## 2017-11-27 DIAGNOSIS — Z87891 Personal history of nicotine dependence: Secondary | ICD-10-CM | POA: Insufficient documentation

## 2017-11-27 DIAGNOSIS — K0889 Other specified disorders of teeth and supporting structures: Secondary | ICD-10-CM

## 2017-11-27 DIAGNOSIS — Z1231 Encounter for screening mammogram for malignant neoplasm of breast: Secondary | ICD-10-CM

## 2017-11-27 MED ORDER — AMOXICILLIN 500 MG PO CAPS: 500.0000 mg | ORAL_CAPSULE | Freq: Three times a day (TID) | ORAL | 0 refills | Status: DC

## 2017-11-27 NOTE — ED Provider Notes (Signed)
[facility] DEPT Provider Note   CSN: [id number] Arrival date & time: 11/27/17  1615     History   Chief Complaint Chief Complaint  Patient presents with  . Dental Pain    HPI Alejandra [name] is a 77 y.o. female.  The history is provided by the patient. No language interpreter was used.  Dental Pain   This is a new problem. The current episode started more than 1 week ago. The problem occurs constantly. The problem has been gradually worsening. The pain is moderate. She has tried nothing for the symptoms. The treatment provided no relief.  Pt coplains of a toothache.  Pt reports some relief with oragel.  Past Medical History:  Diagnosis Date  . Arthritis   . Avascular necrosis of femur head, right ([place]) 04/23/2012  . Cellulitis of shoulder 09/28/2014  . Fracture 10/05/11   "fell and broke right hip"  . Fracture of femoral neck, right ([place]) 10/12/2011  . Glaucoma   . Glaucoma    bilateral  . Hypertension   . Polycythemia vera(238.4)   . Rhabdomyolysis 09/28/2014  . Thrombocytosis ([place]) 04/23/2012    Patient Active Problem List   Diagnosis Date Noted  . Vertigo 09/28/2014  . Essential hypertension 09/28/2014  . Thrombocytosis ([place]) 04/23/2012  . Polycythemia vera ([place]) 12/22/2011    Past Surgical History:  Procedure Laterality Date  . COLONOSCOPY    . HARDWARE REMOVAL  04/23/2012   Procedure: HARDWARE REMOVAL;  Surgeon: Johnny Bridge, MD;  Location: [facility];  Service: Orthopedics;  Laterality: Right;  . HIP ARTHROPLASTY  04/23/2012   Procedure: ARTHROPLASTY BIPOLAR HIP;  Surgeon: Johnny Bridge, MD;  Location: [facility];  Service: Orthopedics;  Laterality: Right;  . HIP PINNING,CANNULATED  10/12/2011   Procedure: CANNULATED HIP PINNING;  Surgeon: Johnny Bridge, MD;  Location: [facility];  Service: Orthopedics;  Laterality: Right;     OB History   None      Home Medications    Prior to Admission medications   Medication Sig Start Date End  Date Taking? Authorizing Provider  ALPRAZolam (XANAX) 0.25 MG tablet Take 0.25 mg by mouth 2 (two) times daily as needed for anxiety.     [provider]  amLODipine (NORVASC) 10 MG tablet Take 10 mg by mouth daily.    [provider]  amoxicillin (AMOXIL) 500 MG capsule Take 1 capsule (500 mg total) by mouth 3 (three) times daily. 11/27/17   Fransico Meadow, PA-C  aspirin EC 81 MG tablet Take 81 mg by mouth daily.    [provider]  atorvastatin (LIPITOR) 10 MG tablet Take 10 mg by mouth daily. 07/12/15   [provider]  benazepril (LOTENSIN) 40 MG tablet Take 40 mg by mouth daily.    [provider]  carvedilol (COREG) 3.125 MG tablet Take 3.125 mg by mouth daily.  11/18/13   [provider]  chlorthalidone (HYGROTON) 25 MG tablet Take 25 mg by mouth daily. 06/09/16   [provider]  dorzolamide-timolol (COSOPT) 22.3-6.8 MG/ML ophthalmic solution Place 1 drop into both eyes 2 (two) times daily.    [provider]  hydroxyurea (HYDREA) 500 MG capsule Take 1 tablet (500mg ) every MWF. Take 2 tablets (1,000mg ) all other days 11/01/17   Ladell Pier, MD  loratadine (CLARITIN) 10 MG tablet Take 10 mg by mouth daily as needed for allergies.    [provider]  meclizine (ANTIVERT) 25 MG tablet Take 25  mg by mouth 3 (three) times daily as needed for dizziness.  03/25/14   [provider]  naproxen (NAPROSYN) 500 MG tablet  10/10/17   [provider]  SIMBRINZA 1-0.2 % SUSP Place 1 drop into both eyes at bedtime.  12/14/15   [provider]  timolol (TIMOPTIC) 0.5 % ophthalmic solution  03/05/17   [provider]  TRAVATAN Z 0.004 % SOLN ophthalmic solution Place 1 drop into both eyes at bedtime. 05/26/15   [provider]  VESICARE 5 MG tablet  03/06/17   [provider]  VYZULTA 0.024 % SOLN  10/02/17   [provider]    Family History No family history on  file.  Social History Social History   Tobacco Use  . Smoking status: Former Smoker    Packs/day: 0.12    Types: Cigarettes    Last attempt to quit: 08/15/1991    Years since quitting: 26.3  . Smokeless tobacco: Never Used  Substance Use Topics  . Alcohol use: No    Comment: 10/12/11 "did drink in my younger; don't drink at all now"  . Drug use: No     Allergies   Patient has no known allergies.   Review of Systems Review of Systems  All other systems reviewed and are negative.    Physical Exam Updated Vital Signs BP 135/76 (BP Location: Left Arm)   Pulse 84   Temp 98.2 F (36.8 C) (Oral)   Resp 18   SpO2 96%   Physical Exam  Constitutional: She is oriented to person, place, and time. She appears well-developed and well-nourished.  HENT:  Head: Normocephalic.  Lower 1st molar taller than other teeth, no loosening,  Slight erythema at gumline  Eyes: EOM are normal.  Neck: Normal range of motion.  Pulmonary/Chest: Effort normal.  Abdominal: She exhibits no distension.  Musculoskeletal: Normal range of motion.  Neurological: She is alert and oriented to person, place, and time.  Psychiatric: She has a normal mood and affect.  Nursing note and vitals reviewed.    ED Treatments / Results  Labs (all labs ordered are listed, but only abnormal results are displayed) Labs Reviewed - No data to display  EKG None  Radiology No results found.  Procedures Procedures (including critical care time)  Medications Ordered in ED Medications - No data to display   Initial Impression / Assessment and Plan / ED Course  I have reviewed the triage vital signs and the nursing notes.  Pertinent labs & imaging results that were available during my care of the patient were reviewed by me and considered in my medical decision making (see chart for details).     MDM  Pt counseled on need for dental evaluation  Final Clinical Impressions(s) / ED Diagnoses   Final  diagnoses:  Pain, dental    ED Discharge Orders        Ordered    amoxicillin (AMOXIL) 500 MG capsule  3 times daily     11/27/17 1704    An After Visit Summary was printed and given to the patient.   Fransico Meadow, PA-C 11/27/17 1722    Fatima Blank, MD 11/29/17 [phone]

## 2017-11-27 NOTE — ED Triage Notes (Signed)
Patient c/o right lower dental pain x2 weeks. Reports she does not have a Pharmacist, community.

## 2017-12-03 ENCOUNTER — Inpatient Hospital Stay: Payer: Medicare HMO | Attending: Oncology

## 2017-12-03 DIAGNOSIS — D45 Polycythemia vera: Secondary | ICD-10-CM | POA: Insufficient documentation

## 2017-12-03 LAB — CBC WITH DIFFERENTIAL (CANCER CENTER ONLY)
Basophils Absolute: 0.3 10*3/uL — ABNORMAL HIGH (ref 0.0–0.1)
Basophils Relative: 1 %
Eosinophils Absolute: 2 10*3/uL — ABNORMAL HIGH (ref 0.0–0.5)
Eosinophils Relative: 5 %
HEMATOCRIT: 45.1 % (ref 34.8–46.6)
Hemoglobin: 12.9 g/dL (ref 11.6–15.9)
LYMPHS ABS: 1.9 10*3/uL (ref 0.9–3.3)
LYMPHS PCT: 5 %
MCH: 18.4 pg — ABNORMAL LOW (ref 25.1–34.0)
MCHC: 28.6 g/dL — AB (ref 31.5–36.0)
MCV: 64.4 fL — AB (ref 79.5–101.0)
MONO ABS: 1.7 10*3/uL — AB (ref 0.1–0.9)
MONOS PCT: 4 %
NEUTROS ABS: 34.5 10*3/uL — AB (ref 1.5–6.5)
Neutrophils Relative %: 85 %
Platelet Count: 1975 10*3/uL (ref 145–400)
RBC: 7 MIL/uL — ABNORMAL HIGH (ref 3.70–5.45)
RDW: 28 % — AB (ref 11.2–14.5)
WBC Count: 40.4 10*3/uL — ABNORMAL HIGH (ref 3.9–10.3)

## 2017-12-04 ENCOUNTER — Telehealth: Payer: Self-pay

## 2017-12-04 NOTE — Telephone Encounter (Addendum)
Attempted to call pt. No VM to leave pt msg.   ----- Message from Ladell Pier, MD sent at 12/03/2017 12:58 PM EDT ----- Please call patient, hemoglobin and platelets remain increased.  Increase the hydroxyurea dose to 1000 mg daily except for 500 mg on Monday and Friday  Be sure she was currently taking 1000 mg daily except for 500 mg Monday, Wednesday, and Friday,  Follow-up as scheduled

## 2017-12-27 DIAGNOSIS — Z96641 Presence of right artificial hip joint: Secondary | ICD-10-CM | POA: Diagnosis not present

## 2017-12-27 DIAGNOSIS — M25551 Pain in right hip: Secondary | ICD-10-CM | POA: Diagnosis not present

## 2017-12-31 ENCOUNTER — Inpatient Hospital Stay: Payer: Medicare HMO

## 2017-12-31 ENCOUNTER — Inpatient Hospital Stay: Payer: Medicare HMO | Attending: Oncology | Admitting: Nurse Practitioner

## 2017-12-31 DIAGNOSIS — M1990 Unspecified osteoarthritis, unspecified site: Secondary | ICD-10-CM | POA: Diagnosis not present

## 2017-12-31 DIAGNOSIS — N182 Chronic kidney disease, stage 2 (mild): Secondary | ICD-10-CM | POA: Diagnosis not present

## 2017-12-31 DIAGNOSIS — Z8249 Family history of ischemic heart disease and other diseases of the circulatory system: Secondary | ICD-10-CM | POA: Diagnosis not present

## 2017-12-31 DIAGNOSIS — Z79899 Other long term (current) drug therapy: Secondary | ICD-10-CM | POA: Diagnosis not present

## 2017-12-31 DIAGNOSIS — S37509D Unspecified injury of fallopian tube, unspecified, subsequent encounter: Secondary | ICD-10-CM | POA: Diagnosis not present

## 2017-12-31 DIAGNOSIS — R7302 Impaired glucose tolerance (oral): Secondary | ICD-10-CM | POA: Diagnosis not present

## 2017-12-31 DIAGNOSIS — Z9181 History of falling: Secondary | ICD-10-CM | POA: Diagnosis not present

## 2017-12-31 DIAGNOSIS — Z780 Asymptomatic menopausal state: Secondary | ICD-10-CM | POA: Diagnosis not present

## 2017-12-31 DIAGNOSIS — D45 Polycythemia vera: Secondary | ICD-10-CM | POA: Diagnosis not present

## 2017-12-31 DIAGNOSIS — I119 Hypertensive heart disease without heart failure: Secondary | ICD-10-CM | POA: Diagnosis not present

## 2018-01-04 ENCOUNTER — Other Ambulatory Visit: Payer: Self-pay | Admitting: Orthopedic Surgery

## 2018-01-08 ENCOUNTER — Other Ambulatory Visit: Payer: Self-pay | Admitting: Orthopedic Surgery

## 2018-01-24 DIAGNOSIS — R69 Illness, unspecified: Secondary | ICD-10-CM | POA: Diagnosis not present

## 2018-01-26 ENCOUNTER — Emergency Department (HOSPITAL_COMMUNITY)
Admission: EM | Admit: 2018-01-26 | Discharge: 2018-01-26 | Disposition: A | Discharge status: Home / Self Care | Payer: Medicare HMO | Attending: Emergency Medicine | Admitting: Emergency Medicine

## 2018-01-26 ENCOUNTER — Emergency Department (HOSPITAL_COMMUNITY): Payer: Medicare HMO

## 2018-01-26 ENCOUNTER — Encounter (HOSPITAL_COMMUNITY): Payer: Self-pay | Admitting: *Deleted

## 2018-01-26 DIAGNOSIS — S79911A Unspecified injury of right hip, initial encounter: Secondary | ICD-10-CM | POA: Diagnosis not present

## 2018-01-26 DIAGNOSIS — Z79899 Other long term (current) drug therapy: Secondary | ICD-10-CM | POA: Diagnosis not present

## 2018-01-26 DIAGNOSIS — Z87891 Personal history of nicotine dependence: Secondary | ICD-10-CM | POA: Insufficient documentation

## 2018-01-26 DIAGNOSIS — I10 Essential (primary) hypertension: Secondary | ICD-10-CM | POA: Diagnosis not present

## 2018-01-26 DIAGNOSIS — M25551 Pain in right hip: Secondary | ICD-10-CM | POA: Insufficient documentation

## 2018-01-26 DIAGNOSIS — Z96641 Presence of right artificial hip joint: Secondary | ICD-10-CM | POA: Diagnosis not present

## 2018-01-26 MED ORDER — KETOROLAC TROMETHAMINE 30 MG/ML IJ SOLN
30.0000 mg | Freq: Once | INTRAMUSCULAR | Status: AC
  Administered 2018-01-26: 30 mg via INTRAMUSCULAR
  Filled 2018-01-26: qty 1

## 2018-01-26 NOTE — Discharge Instructions (Addendum)
See your Physician for recheck on Monday.  Take your pain medication as directed

## 2018-01-26 NOTE — ED Provider Notes (Signed)
[facility] DEPT Provider Note   CSN: [id number] Arrival date & time: 01/26/18  1146     History   Chief Complaint Chief Complaint  Patient presents with  . Hip Pain    HPI Alejandra [name] is a 77 y.o. female.  The history is provided by the patient. No language interpreter was used.  Hip Pain  This is a new problem. The problem occurs constantly. The problem has not changed since onset.Nothing aggravates the symptoms. Nothing relieves the symptoms. She has tried nothing for the symptoms. The treatment provided no relief.   Pt is scheduled to have surgery in 2 weeks.  Pt reports she sat in dentist chair and now has increased pain in her right hip.  Past Medical History:  Diagnosis Date  . Arthritis   . Avascular necrosis of femur head, right ([place]) 04/23/2012  . Cellulitis of shoulder 09/28/2014  . Fracture 10/05/11   "fell and broke right hip"  . Fracture of femoral neck, right ([place]) 10/12/2011  . Glaucoma   . Glaucoma    bilateral  . Hypertension   . Polycythemia vera(238.4)   . Rhabdomyolysis 09/28/2014  . Thrombocytosis ([place]) 04/23/2012    Patient Active Problem List   Diagnosis Date Noted  . Vertigo 09/28/2014  . Essential hypertension 09/28/2014  . Thrombocytosis ([place]) 04/23/2012  . Polycythemia vera ([place]) 12/22/2011    Past Surgical History:  Procedure Laterality Date  . COLONOSCOPY    . HARDWARE REMOVAL  04/23/2012   Procedure: HARDWARE REMOVAL;  Surgeon: Johnny Bridge, MD;  Location: [facility];  Service: Orthopedics;  Laterality: Right;  . HIP ARTHROPLASTY  04/23/2012   Procedure: ARTHROPLASTY BIPOLAR HIP;  Surgeon: Johnny Bridge, MD;  Location: [facility];  Service: Orthopedics;  Laterality: Right;  . HIP PINNING,CANNULATED  10/12/2011   Procedure: CANNULATED HIP PINNING;  Surgeon: Johnny Bridge, MD;  Location: [facility];  Service: Orthopedics;  Laterality: Right;     OB History   None      Home Medications    Prior to Admission  medications   Medication Sig Start Date End Date Taking? Authorizing Provider  ALPRAZolam (XANAX) 0.25 MG tablet Take 0.25 mg by mouth 2 (two) times daily as needed for anxiety.     [provider]  amLODipine (NORVASC) 10 MG tablet Take 10 mg by mouth daily.    [provider]  aspirin EC 81 MG tablet Take 81 mg by mouth daily.    [provider]  atorvastatin (LIPITOR) 10 MG tablet Take 10 mg by mouth at bedtime.  07/12/15   [provider]  benazepril (LOTENSIN) 40 MG tablet Take 40 mg by mouth daily.    [provider]  hydroxyurea (HYDREA) 500 MG capsule Take 1 tablet (500mg ) every MWF. Take 2 tablets (1,000mg ) all other days Patient taking differently: Take 500-1,000 mg by mouth See admin instructions. Take 1 tablet (500mg ) every MWF. Take 2 tablets (1,000mg ) all other days 11/01/17   Ladell Pier, MD  loratadine (CLARITIN) 10 MG tablet Take 10 mg by mouth daily as needed for allergies.    [provider]  meclizine (ANTIVERT) 25 MG tablet Take 25 mg by mouth 3 (three) times daily as needed for dizziness.  03/25/14   [provider]  SIMBRINZA 1-0.2 % SUSP Place 1 drop into both eyes 2 (two) times daily.  12/14/15   [provider]  TRAVATAN Z 0.004 % SOLN ophthalmic solution Place 1  drop into both eyes at bedtime. 05/26/15   [provider]    Family History No family history on file.  Social History Social History   Tobacco Use  . Smoking status: Former Smoker    Packs/day: 0.12    Types: Cigarettes    Last attempt to quit: 08/15/1991    Years since quitting: 26.4  . Smokeless tobacco: Never Used  Substance Use Topics  . Alcohol use: No    Comment: 10/12/11 "did drink in my younger; don't drink at all now"  . Drug use: No     Allergies   Patient has no known allergies.   Review of Systems Review of Systems  All other systems reviewed and are negative.    Physical Exam Updated Vital  Signs BP (!) 173/108 (BP Location: Left Arm)   Pulse 74   Temp 97.9 F (36.6 C)   Resp 17   SpO2 97%   Physical Exam  Constitutional: She appears well-developed and well-nourished.  HENT:  Head: Normocephalic.  Eyes: Pupils are equal, round, and reactive to light.  Cardiovascular: Normal rate.  Pulmonary/Chest: Effort normal.  Musculoskeletal: She exhibits tenderness.  Neurological: She is alert.  Skin: Skin is warm.  Psychiatric: She has a normal mood and affect.  Nursing note and vitals reviewed.    ED Treatments / Results  Labs (all labs ordered are listed, but only abnormal results are displayed) Labs Reviewed - No data to display  EKG None  Radiology Dg Hip Unilat W Or Wo Pelvis 2-3 Views Right  Result Date: 01/26/2018 CLINICAL DATA:  78 year old female with a history of generalized right hip pain with no injury EXAM: DG HIP (WITH OR WITHOUT PELVIS) 2-3V RIGHT COMPARISON:  11/07/2015 FINDINGS: Osteopenia. Surgical changes of right hip arthroplasty. Heterotopic ossification at the right hip. Components appear congruent. Degenerative changes of the left hip. No acute displaced fracture identified. IMPRESSION: Negative for acute displaced fracture. Surgical changes of right hip arthroplasty with no acute problem identified with the hardware. Heterotopic ossification again noted. Electronically Signed   By: Corrie Mckusick D.O.   On: 01/26/2018 14:51    Procedures Procedures (including critical care time)  Medications Ordered in ED Medications  ketorolac (TORADOL) 30 MG/ML injection 30 mg (30 mg Intramuscular Given 01/26/18 1545)     Initial Impression / Assessment and Plan / ED Course  I have reviewed the triage vital signs and the nursing notes.  Pertinent labs & imaging results that were available during my care of the patient were reviewed by me and considered in my medical decision making (see chart for details).     MDM  Xray no acute cahnge.  Pt given torodol  Im.  Pt has pain medication at home that she has not tried.  Pt advised to try medication.  See her Md for recheck next week.  Final Clinical Impressions(s) / ED Diagnoses   Final diagnoses:  Right hip pain    ED Discharge Orders    None    An After Visit Summary was printed and given to the patient.    Fransico Meadow, [place] 01/26/18 1709    Margette Fast, MD 01/26/18 [phone]

## 2018-01-26 NOTE — ED Triage Notes (Signed)
Pt complains of right hip pain hip that became worse yesterday while sitting at the dentist office.pt has hx of pain in right hip.

## 2018-01-28 NOTE — Patient Instructions (Addendum)
Alejandra [name]  01/28/2018   Your procedure is scheduled on: 02-04-18   Report to [facility] Main  Entrance    Report to Admitting at 10:45 AM    Call this number if you have problems the morning of surgery [phone]   Remember: Do not eat food or drink liquids :After Midnight.     Take these medicines the morning of surgery with A SIP OF WATER: Amlodipine (Norvasc), and  Alprazolam (Xanax) as needed. You may also bring and use your eyedrops as needed.                                 You may not have any metal on your body including hair pins and              piercings  Do not wear jewelry, make-up, lotions, powders or perfumes, deodorant             Do not wear nail polish.  Do not shave  48 hours prior to surgery.                 Do not bring valuables to the hospital. [place].  Contacts, dentures or bridgework may not be worn into surgery.  Leave suitcase in the car. After surgery it may be brought to your room.      Special Instructions: N/A              Please read over the following fact sheets you were given: _____________________________________________________________________         [facility] - Preparing for Surgery Before surgery, you can play an important role.  Because skin is not sterile, your skin needs to be as free of germs as possible.  You can reduce the number of germs on your skin by washing with CHG (chlorahexidine gluconate) soap before surgery.  CHG is an antiseptic cleaner which kills germs and bonds with the skin to continue killing germs even after washing. Please DO NOT use if you have an allergy to CHG or antibacterial soaps.  If your skin becomes reddened/irritated stop using the CHG and inform your nurse when you arrive at Short Stay. Do not shave (including legs and underarms) for at least 48 hours prior to the first CHG shower.  You may shave your face/neck. Please  follow these instructions carefully:  1.  Shower with CHG Soap the night before surgery and the  morning of Surgery.  2.  If you choose to wash your hair, wash your hair first as usual with your  normal  shampoo.  3.  After you shampoo, rinse your hair and body thoroughly to remove the  shampoo.                           4.  Use CHG as you would any other liquid soap.  You can apply chg directly  to the skin and wash                       Gently with a scrungie or clean washcloth.  5.  Apply the CHG Soap to your body ONLY FROM THE NECK DOWN.  Do not use on face/ open                           Wound or open sores. Avoid contact with eyes, ears mouth and genitals (private parts).                       Wash face,  Genitals (private parts) with your normal soap.             6.  Wash thoroughly, paying special attention to the area where your surgery  will be performed.  7.  Thoroughly rinse your body with warm water from the neck down.  8.  DO NOT shower/wash with your normal soap after using and rinsing off  the CHG Soap.                9.  Pat yourself dry with a clean towel.            10.  Wear clean pajamas.            11.  Place clean sheets on your bed the night of your first shower and do not  sleep with pets. Day of Surgery : Do not apply any lotions/deodorants the morning of surgery.  Please wear clean clothes to the hospital/surgery center.  FAILURE TO FOLLOW THESE INSTRUCTIONS MAY RESULT IN THE CANCELLATION OF YOUR SURGERY PATIENT SIGNATURE_________________________________  NURSE SIGNATURE__________________________________  ________________________________________________________________________  WHAT IS A BLOOD TRANSFUSION? Blood Transfusion Information  A transfusion is the replacement of blood or some of its parts. Blood is made up of multiple cells which provide different functions.  Red blood cells carry oxygen and are used for blood loss replacement.  White blood cells  fight against infection.  Platelets control bleeding.  Plasma helps clot blood.  Other blood products are available for specialized needs, such as hemophilia or other clotting disorders. BEFORE THE TRANSFUSION  Who gives blood for transfusions?   Healthy volunteers who are fully evaluated to make sure their blood is safe. This is blood bank blood. Transfusion therapy is the safest it has ever been in the practice of medicine. Before blood is taken from a donor, a complete history is taken to make sure that person has no history of diseases nor engages in risky social behavior (examples are intravenous drug use or sexual activity with multiple partners). The donor's travel history is screened to minimize risk of transmitting infections, such as malaria. The donated blood is tested for signs of infectious diseases, such as HIV and hepatitis. The blood is then tested to be sure it is compatible with you in order to minimize the chance of a transfusion reaction. If you or a relative donates blood, this is often done in anticipation of surgery and is not appropriate for emergency situations. It takes many days to process the donated blood. RISKS AND COMPLICATIONS Although transfusion therapy is very safe and saves many lives, the main dangers of transfusion include:   Getting an infectious disease.  Developing a transfusion reaction. This is an allergic reaction to something in the blood you were given. Every precaution is taken to prevent this. The decision to have a blood transfusion has been considered carefully by your caregiver before blood is given. Blood is not given unless the benefits outweigh the risks. AFTER THE TRANSFUSION  Right after receiving a blood transfusion, you will usually feel much better and more energetic. This is especially  true if your red blood cells have gotten low (anemic). The transfusion raises the level of the red blood cells which carry oxygen, and this usually  causes an energy increase.  The nurse administering the transfusion will monitor you carefully for complications. HOME CARE INSTRUCTIONS  No special instructions are needed after a transfusion. You may find your energy is better. Speak with your caregiver about any limitations on activity for underlying diseases you may have. SEEK MEDICAL CARE IF:   Your condition is not improving after your transfusion.  You develop redness or irritation at the intravenous (IV) site. SEEK IMMEDIATE MEDICAL CARE IF:  Any of the following symptoms occur over the next 12 hours:  Shaking chills.  You have a temperature by mouth above 102 F (38.9 C), not controlled by medicine.  Chest, back, or muscle pain.  People around you feel you are not acting correctly or are confused.  Shortness of breath or difficulty breathing.  Dizziness and fainting.  You get a rash or develop hives.  You have a decrease in urine output.  Your urine turns a dark color or changes to pink, red, or brown. Any of the following symptoms occur over the next 10 days:  You have a temperature by mouth above 102 F (38.9 C), not controlled by medicine.  Shortness of breath.  Weakness after normal activity.  The white part of the eye turns yellow (jaundice).  You have a decrease in the amount of urine or are urinating less often.  Your urine turns a dark color or changes to pink, red, or brown. Document Released: 07/28/2000 Document Revised: 10/23/2011 Document Reviewed: 03/16/2008 [facility] Patient Information 2014 [place], [place].  _______________________________________________________________________

## 2018-01-29 ENCOUNTER — Ambulatory Visit (HOSPITAL_COMMUNITY)
Admission: RE | Admit: 2018-01-29 | Discharge: 2018-01-29 | Disposition: A | Discharge status: Home / Self Care | Payer: Medicare HMO | Source: Ambulatory Visit | Attending: Orthopedic Surgery | Admitting: Orthopedic Surgery

## 2018-01-29 ENCOUNTER — Encounter (HOSPITAL_COMMUNITY): Payer: Self-pay

## 2018-01-29 ENCOUNTER — Other Ambulatory Visit: Payer: Self-pay

## 2018-01-29 ENCOUNTER — Encounter (HOSPITAL_COMMUNITY)
Admission: RE | Admit: 2018-01-29 | Discharge: 2018-01-29 | Disposition: A | Discharge status: Home / Self Care | Payer: Medicare HMO | Source: Ambulatory Visit | Attending: Orthopedic Surgery | Admitting: Orthopedic Surgery

## 2018-01-29 DIAGNOSIS — I517 Cardiomegaly: Secondary | ICD-10-CM | POA: Diagnosis not present

## 2018-01-29 DIAGNOSIS — Z01812 Encounter for preprocedural laboratory examination: Secondary | ICD-10-CM | POA: Insufficient documentation

## 2018-01-29 DIAGNOSIS — D45 Polycythemia vera: Secondary | ICD-10-CM | POA: Diagnosis not present

## 2018-01-29 DIAGNOSIS — Z01810 Encounter for preprocedural cardiovascular examination: Secondary | ICD-10-CM | POA: Diagnosis not present

## 2018-01-29 DIAGNOSIS — Z01818 Encounter for other preprocedural examination: Secondary | ICD-10-CM | POA: Diagnosis not present

## 2018-01-29 LAB — CBC WITH DIFFERENTIAL/PLATELET
Basophils Absolute: 0 10*3/uL (ref 0.0–0.1)
Basophils Relative: 0 %
EOS PCT: 4 %
Eosinophils Absolute: 1.2 10*3/uL — ABNORMAL HIGH (ref 0.0–0.7)
HEMATOCRIT: 46.9 % — AB (ref 36.0–46.0)
HEMOGLOBIN: 12.9 g/dL (ref 12.0–15.0)
LYMPHS ABS: 2.1 10*3/uL (ref 0.7–4.0)
LYMPHS PCT: 7 %
MCH: 18.2 pg — ABNORMAL LOW (ref 26.0–34.0)
MCHC: 27.5 g/dL — AB (ref 30.0–36.0)
MCV: 66.1 fL — AB (ref 78.0–100.0)
MONOS PCT: 10 %
Monocytes Absolute: 3 10*3/uL — ABNORMAL HIGH (ref 0.1–1.0)
NEUTROS ABS: 23.3 10*3/uL — AB (ref 1.7–7.7)
Neutrophils Relative %: 79 %
Platelets: 1486 10*3/uL (ref 150–400)
RBC: 7.09 MIL/uL — ABNORMAL HIGH (ref 3.87–5.11)
RDW: 25.3 % — AB (ref 11.5–15.5)
WBC: 29.6 10*3/uL — ABNORMAL HIGH (ref 4.0–10.5)

## 2018-01-29 LAB — BASIC METABOLIC PANEL
ANION GAP: 6 (ref 5–15)
BUN: 25 mg/dL — AB (ref 6–20)
CHLORIDE: 109 mmol/L (ref 101–111)
CO2: 29 mmol/L (ref 22–32)
Calcium: 9.5 mg/dL (ref 8.9–10.3)
Creatinine, Ser: 0.9 mg/dL (ref 0.44–1.00)
GFR calc Af Amer: >60 mL/min (ref >60)
GFR calc non Af Amer: 60 mL/min — ABNORMAL LOW (ref >60)
GLUCOSE: 79 mg/dL (ref 65–99)
POTASSIUM: 4.5 mmol/L (ref 3.5–5.1)
Sodium: 144 mmol/L (ref 135–145)

## 2018-01-29 LAB — URINALYSIS, ROUTINE W REFLEX MICROSCOPIC
BILIRUBIN URINE: NEGATIVE
Bacteria, UA: NONE SEEN
Glucose, UA: NEGATIVE mg/dL
Hgb urine dipstick: NEGATIVE
KETONES UR: NEGATIVE mg/dL
Nitrite: NEGATIVE
PH: 5 (ref 5.0–8.0)
Protein, ur: NEGATIVE mg/dL
Specific Gravity, Urine: 1.024 (ref 1.005–1.030)

## 2018-01-29 LAB — SURGICAL PCR SCREEN
MRSA, PCR: NEGATIVE
STAPHYLOCOCCUS AUREUS: NEGATIVE

## 2018-01-29 LAB — TYPE AND SCREEN
ABO/RH(D): A POS
Antibody Screen: POSITIVE
DAT, IGG: NEGATIVE
PT AG Type: NEGATIVE

## 2018-01-29 LAB — PROTIME-INR
INR: 1.38
Prothrombin Time: 16.9 seconds — ABNORMAL HIGH (ref 11.4–15.2)

## 2018-01-29 LAB — APTT: aPTT: 40 seconds — ABNORMAL HIGH (ref 24–36)

## 2018-01-29 NOTE — Progress Notes (Signed)
01-29-18 CBC w/Diff, BMP, PT, and PTT routed to Dr. Damita Dunnings office for review. Also spoke to [place] at Dr. Damita Dunnings office regarding critical value platelet result. Juliann Pulse will relay results to Dr. Mayer Camel  Also per lab, patient has positive antibodies.T&S will be redrawn on day of surgery.

## 2018-01-31 ENCOUNTER — Encounter: Payer: Self-pay | Admitting: Cardiology

## 2018-01-31 ENCOUNTER — Ambulatory Visit (INDEPENDENT_AMBULATORY_CARE_PROVIDER_SITE_OTHER): Payer: Medicare HMO | Admitting: Cardiology

## 2018-01-31 ENCOUNTER — Telehealth (HOSPITAL_COMMUNITY): Payer: Self-pay

## 2018-01-31 VITALS — BP 128/76 | HR 84 | Ht 64.0 in | Wt 116.8 lb

## 2018-01-31 DIAGNOSIS — R9431 Abnormal electrocardiogram [ECG] [EKG]: Secondary | ICD-10-CM | POA: Insufficient documentation

## 2018-01-31 DIAGNOSIS — I10 Essential (primary) hypertension: Secondary | ICD-10-CM | POA: Diagnosis not present

## 2018-01-31 DIAGNOSIS — Z01810 Encounter for preprocedural cardiovascular examination: Secondary | ICD-10-CM

## 2018-01-31 DIAGNOSIS — D473 Essential (hemorrhagic) thrombocythemia: Secondary | ICD-10-CM | POA: Diagnosis not present

## 2018-01-31 DIAGNOSIS — D75839 Thrombocytosis, unspecified: Secondary | ICD-10-CM

## 2018-01-31 NOTE — Progress Notes (Signed)
Cardiology Office Note:    Date:  01/31/2018   ID:  Alejandra [name], DOB Nov 24, 1940, MRN [id number]  PCP:  Vincente Liberty, MD  Cardiologist:  Jenean Lindau, MD   Referring MD: Vincente Liberty, MD    ASSESSMENT:    1. Pre-operative cardiovascular examination   2. Essential hypertension   3. Thrombocytosis ([place])   4. Abnormal EKG    PLAN:    In order of problems listed above:  1. Primary prevention stressed with patient.  Importance of compliance with diet and medications stressed and she vocalized understanding.  Her blood pressure is stable and under good control. 2. Risk stratification process was explained to the patient and she understands.  She and her children had multiple questions which were answered to their satisfaction.  In view of multiple risk factors for coronary artery disease she will undergo Lexiscan sestamibi testing.  If this is negative she is not at high risk for coronary events during the aforementioned surgery.  Meticulous hemodynamic monitoring will further reduce the risk of coronary events.  She will be seen in follow-up appointment on a as needed basis only.   Medication Adjustments/Labs and Tests Ordered: Current medicines are reviewed at length with the patient today.  Concerns regarding medicines are outlined above.  No orders of the defined types were placed in this encounter.  No orders of the defined types were placed in this encounter.    History of Present Illness:    ETHERINE [name] is a 77 y.o. female who is being seen today for the evaluation of preoperative stratification follow-up from a cardiovascular standpoint at the request of Vincente Liberty, MD.  Patient is a pleasant 77 year old female accompanied by her children.  She has a history of essential hypertension and thrombocytosis.  She denies any problems from a cardiovascular standpoint.  No chest pain orthopnea or PND.  She is planning to undergo right hip replacement surgery  obviously leads a sedentary lifestyle.  Her EKG is abnormal and therefore she is here for an evaluation.  At the time of my evaluation, the patient is alert awake oriented and in no distress.  Past Medical History:  Diagnosis Date  . Arthritis   . Avascular necrosis of femur head, right ([place]) 04/23/2012  . Cellulitis of shoulder 09/28/2014  . Fracture 10/05/11   "fell and broke right hip"  . Fracture of femoral neck, right ([place]) 10/12/2011  . Glaucoma   . Glaucoma    bilateral  . Hypertension   . Polycythemia vera(238.4)   . Rhabdomyolysis 09/28/2014  . Thrombocytosis ([place]) 04/23/2012    Past Surgical History:  Procedure Laterality Date  . COLONOSCOPY    . HARDWARE REMOVAL  04/23/2012   Procedure: HARDWARE REMOVAL;  Surgeon: Johnny Bridge, MD;  Location: [place] [facility];  Service: Orthopedics;  Laterality: Right;  . HIP ARTHROPLASTY  04/23/2012   Procedure: ARTHROPLASTY BIPOLAR HIP;  Surgeon: Johnny Bridge, MD;  Location: [place];  Service: Orthopedics;  Laterality: Right;  . HIP PINNING,CANNULATED  10/12/2011   Procedure: CANNULATED HIP PINNING;  Surgeon: Johnny Bridge, MD;  Location: [place];  Service: Orthopedics;  Laterality: Right;    Current Medications: Current Meds  Medication Sig  . ALPRAZolam (XANAX) 0.25 MG tablet Take 0.25 mg by mouth 2 (two) times daily as needed for anxiety.   Marland Kitchen amLODipine (NORVASC) 10 MG tablet Take 10 mg by mouth daily.  Marland Kitchen aspirin EC 81 MG tablet Take 81 mg by mouth daily.  Marland Kitchen  atorvastatin (LIPITOR) 10 MG tablet Take 10 mg by mouth at bedtime.   . benazepril (LOTENSIN) 40 MG tablet Take 40 mg by mouth daily.  . hydroxyurea (HYDREA) 500 MG capsule Take 1 tablet (500mg ) every MWF. Take 2 tablets (1,000mg ) all other days (Patient taking differently: Take 500-1,000 mg by mouth See admin instructions. Take 1 tablet (500mg ) every MWF. Take 2 tablets (1,000mg ) all other days)  . loratadine (CLARITIN) 10 MG tablet Take 10 mg by mouth daily as needed for allergies.  Marland Kitchen  meclizine (ANTIVERT) 25 MG tablet Take 25 mg by mouth 3 (three) times daily as needed for dizziness.   Marland Kitchen SIMBRINZA 1-0.2 % SUSP Place 1 drop into both eyes 2 (two) times daily.   . TRAVATAN Z 0.004 % SOLN ophthalmic solution Place 1 drop into both eyes at bedtime.     Allergies:   Patient has no known allergies.   Social History   Socioeconomic History  . Marital status: Widowed    Spouse name: Not on file  . Number of children: Not on file  . Years of education: Not on file  . Highest education level: Not on file  Occupational History  . Not on file  Social Needs  . Financial resource strain: Not on file  . Food insecurity:    Worry: Not on file    Inability: Not on file  . Transportation needs:    Medical: Not on file    Non-medical: Not on file  Tobacco Use  . Smoking status: Former Smoker    Packs/day: 0.12    Types: Cigarettes    Last attempt to quit: 08/15/1991    Years since quitting: 26.4  . Smokeless tobacco: Never Used  Substance and Sexual Activity  . Alcohol use: No    Comment: 10/12/11 "did drink in my younger; don't drink at all now"  . Drug use: No  . Sexual activity: Never  Lifestyle  . Physical activity:    Days per week: Not on file    Minutes per session: Not on file  . Stress: Not on file  Relationships  . Social connections:    Talks on phone: Not on file    Gets together: Not on file    Attends religious service: Not on file    Active member of club or organization: Not on file    Attends meetings of clubs or organizations: Not on file    Relationship status: Not on file  Other Topics Concern  . Not on file  Social History Narrative  . Not on file     Family History: The patient's family history includes Asthma in her sister; Cancer in her mother; Hypertension in her father.  ROS:   Please see the history of present illness.    All other systems reviewed and are negative.  EKGs/Labs/Other Studies Reviewed:    The following studies  were reviewed today: EKG reveals sinus rhythm and nonspecific ST-T changes and I reviewed and discussed the findings with the patient.   Recent Labs: 03/05/2017: ALT 16 01/29/2018: BUN 25; Creatinine, Ser 0.90; Hemoglobin 12.9; Platelets 1,486; Potassium 4.5; Sodium 144  Recent Lipid Panel    Component Value Date/Time   CHOL 136 07/08/2012 1428   TRIG 62 07/08/2012 1428   HDL 41 07/08/2012 1428   CHOLHDL 3.3 07/08/2012 1428   VLDL 12 07/08/2012 1428   LDLCALC 83 07/08/2012 1428    Physical Exam:    VS:  BP 128/76 (BP Location: Right  Arm, Patient Position: Sitting, Cuff Size: Normal)   Pulse 84   Ht 5\' 4"  (1.626 m)   Wt 116 lb 12.8 oz (53 kg)   SpO2 98%   BMI 20.05 kg/m     Wt Readings from Last 3 Encounters:  01/31/18 116 lb 12.8 oz (53 kg)  01/29/18 115 lb 8 oz (52.4 kg)  11/01/17 118 lb (53.5 kg)     GEN: Patient is in no acute distress HEENT: Normal NECK: No JVD; No carotid bruits LYMPHATICS: No lymphadenopathy CARDIAC: S1 S2 regular, 2/6 systolic murmur at the apex. RESPIRATORY:  Clear to auscultation without rales, wheezing or rhonchi  ABDOMEN: Soft, non-tender, non-distended MUSCULOSKELETAL:  No edema; No deformity  SKIN: Warm and dry NEUROLOGIC:  Alert and oriented x 3 PSYCHIATRIC:  Normal affect    Signed, Jenean Lindau, MD  01/31/2018 9:37 AM    [place]

## 2018-01-31 NOTE — Patient Instructions (Signed)
Medication Instructions:  Your physician recommends that you continue on your current medications as directed. Please refer to the Current Medication list given to you today.  Labwork: None  Testing/Procedures: Your physician has requested that you have a lexiscan myoview. For further information please visit [email or web address]. Please follow instruction sheet, as given.  Follow-Up: Your physician recommends that you schedule a follow-up appointment in: as needed  Any Other Special Instructions Will Be Listed Below (If Applicable).     If you need a refill on your cardiac medications before your next appointment, please call your pharmacy.   [place], RN, BSN

## 2018-01-31 NOTE — Telephone Encounter (Signed)
Encounter complete. 

## 2018-02-01 ENCOUNTER — Ambulatory Visit (HOSPITAL_BASED_OUTPATIENT_CLINIC_OR_DEPARTMENT_OTHER)
Admission: RE | Admit: 2018-02-01 | Discharge: 2018-02-01 | Disposition: A | Discharge status: Home / Self Care | Payer: Medicare HMO | Source: Ambulatory Visit | Attending: Cardiovascular Disease | Admitting: Cardiovascular Disease

## 2018-02-01 ENCOUNTER — Telehealth: Payer: Self-pay

## 2018-02-01 DIAGNOSIS — T84018A Broken internal joint prosthesis, other site, initial encounter: Secondary | ICD-10-CM

## 2018-02-01 DIAGNOSIS — R9431 Abnormal electrocardiogram [ECG] [EKG]: Secondary | ICD-10-CM | POA: Diagnosis not present

## 2018-02-01 DIAGNOSIS — Z96649 Presence of unspecified artificial hip joint: Secondary | ICD-10-CM

## 2018-02-01 LAB — MYOCARDIAL PERFUSION IMAGING
CHL CUP NUCLEAR SDS: 2
CHL CUP NUCLEAR SSS: 4
CSEPPHR: 96 {beats}/min
LV dias vol: 127 mL (ref 46–106)
LV sys vol: 67 mL
Rest HR: 67 {beats}/min
SRS: 2
TID: 1.15

## 2018-02-01 MED ORDER — REGADENOSON 0.4 MG/5ML IV SOLN
0.4000 mg | Freq: Once | INTRAVENOUS | Status: AC
  Administered 2018-02-01: 0.4 mg via INTRAVENOUS

## 2018-02-01 MED ORDER — TECHNETIUM TC 99M TETROFOSMIN IV KIT
30.2000 | PACK | Freq: Once | INTRAVENOUS | Status: AC | PRN
  Administered 2018-02-01: 30.2 via INTRAVENOUS
  Filled 2018-02-01: qty 31

## 2018-02-01 MED ORDER — TECHNETIUM TC 99M TETROFOSMIN IV KIT
9.5000 | PACK | Freq: Once | INTRAVENOUS | Status: AC | PRN
  Administered 2018-02-01: 9.5 via INTRAVENOUS
  Filled 2018-02-01: qty 10

## 2018-02-01 NOTE — Telephone Encounter (Signed)
Could not get through on phone to leave a voicemail regarding cardiac testing results, will call again later.

## 2018-02-01 NOTE — H&P (Signed)
TOTAL HIP REVISION ADMISSION H&P  Patient is admitted for right revision total hip arthroplasty.  Subjective:  Chief Complaint: right hip pain  HPI: Alejandra [name], 77 y.o. female, has a history of pain and functional disability in the right hip due to trauma and arthritis and patient has failed non-surgical conservative treatments for greater than 12 weeks to include NSAID's and/or analgesics, use of assistive devices, weight reduction as appropriate and activity modification. The indications for the revision total hip arthroplasty are history of right hemi-hip arthroplasty with loosening and subsidence of the cement mantle.  Onset of symptoms was gradual starting 2 years ago with gradually worsening course since that time.  Prior procedures on the right hip include hemi-arthroplasty.  Patient currently rates pain in the right hip at 10 out of 10 with activity.  There is night pain, worsening of pain with activity and weight bearing, trendelenberg gait, pain that interfers with activities of daily living and pain with passive range of motion. Patient has evidence of joint space narrowing and prosthetic loosening by imaging studies.  This condition presents safety issues increasing the risk of falls.   There is no current active infection.  Patient Active Problem List   Diagnosis Date Noted  . Pre-operative cardiovascular examination 01/31/2018  . Abnormal EKG 01/31/2018  . Vertigo 09/28/2014  . Essential hypertension 09/28/2014  . Thrombocytosis ([place]) 04/23/2012  . Polycythemia vera ([place]) 12/22/2011   Past Medical History:  Diagnosis Date  . Arthritis   . Avascular necrosis of femur head, right ([place]) 04/23/2012  . Cellulitis of shoulder 09/28/2014  . Fracture 10/05/11   "fell and broke right hip"  . Fracture of femoral neck, right ([place]) 10/12/2011  . Glaucoma   . Glaucoma    bilateral  . Hypertension   . Polycythemia vera(238.4)   . Rhabdomyolysis 09/28/2014  . Thrombocytosis ([place])  04/23/2012    Past Surgical History:  Procedure Laterality Date  . COLONOSCOPY    . HARDWARE REMOVAL  04/23/2012   Procedure: HARDWARE REMOVAL;  Surgeon: Johnny Bridge, MD;  Location: [facility];  Service: Orthopedics;  Laterality: Right;  . HIP ARTHROPLASTY  04/23/2012   Procedure: ARTHROPLASTY BIPOLAR HIP;  Surgeon: Johnny Bridge, MD;  Location: [facility];  Service: Orthopedics;  Laterality: Right;  . HIP PINNING,CANNULATED  10/12/2011   Procedure: CANNULATED HIP PINNING;  Surgeon: Johnny Bridge, MD;  Location: [facility];  Service: Orthopedics;  Laterality: Right;    No current facility-administered medications for this encounter.    Current Outpatient Medications  Medication Sig Dispense Refill Last Dose  . ALPRAZolam (XANAX) 0.25 MG tablet Take 0.25 mg by mouth 2 (two) times daily as needed for anxiety.    Taking  . amLODipine (NORVASC) 10 MG tablet Take 10 mg by mouth daily.   Taking  . aspirin EC 81 MG tablet Take 81 mg by mouth daily.   Taking  . atorvastatin (LIPITOR) 10 MG tablet Take 10 mg by mouth at bedtime.   5 Taking  . benazepril (LOTENSIN) 40 MG tablet Take 40 mg by mouth daily.   Taking  . hydroxyurea (HYDREA) 500 MG capsule Take 1 tablet (500mg ) every MWF. Take 2 tablets (1,000mg ) all other days (Patient taking differently: Take 500-1,000 mg by mouth See admin instructions. Take 1 tablet (500mg ) every MWF. Take 2 tablets (1,000mg ) all other days) 44 capsule 0 Taking  . loratadine (CLARITIN) 10 MG tablet Take 10 mg by mouth daily as needed for allergies.   Taking  .  meclizine (ANTIVERT) 25 MG tablet Take 25 mg by mouth 3 (three) times daily as needed for dizziness.   1 Taking  . SIMBRINZA 1-0.2 % SUSP Place 1 drop into both eyes 2 (two) times daily.    Taking  . TRAVATAN Z 0.004 % SOLN ophthalmic solution Place 1 drop into both eyes at bedtime.   Taking   No Known Allergies  Social History   Tobacco Use  . Smoking status: Former Smoker    Packs/day: 0.12    Types: Cigarettes     Last attempt to quit: 08/15/1991    Years since quitting: 26.4  . Smokeless tobacco: Never Used  Substance Use Topics  . Alcohol use: No    Comment: 10/12/11 "did drink in my younger; don't drink at all now"    Family History  Problem Relation Age of Onset  . Cancer Mother   . Hypertension Father   . Asthma Sister       Review of Systems  Constitutional: Negative.   HENT: Positive for sinus pain.   Eyes: Negative.   Respiratory: Negative.   Cardiovascular:       Htn  Gastrointestinal: Negative.   Genitourinary: Negative.   Musculoskeletal: Positive for joint pain.  Skin: Negative.   Neurological: Negative.   Endo/Heme/Allergies:       Polycythemia vera  Psychiatric/Behavioral: Negative.     Objective:  Physical Exam  Constitutional: She is oriented to person, place, and time. She appears well-developed and well-nourished.  HENT:  Head: Normocephalic and atraumatic.  Eyes: Pupils are equal, round, and reactive to light.  Neck: Normal range of motion. Neck supple.  Cardiovascular: Intact distal pulses.  Respiratory: Effort normal.  Musculoskeletal: She exhibits tenderness.  the patient does have tenderness over the right lateral hip near the greater trochanteric region.  Discomfort with he will bump.  Minimal pain with hip flexion extension internal and external rotation or log roll.  Calves are soft and nontender.  She is neurovascularly intact distally.  Neurological: She is alert and oriented to person, place, and time.  Skin: Skin is warm and dry.  Psychiatric: She has a normal mood and affect. Her behavior is normal. Judgment and thought content normal.    Vital signs in last 24 hours: Weight:  [52.6 kg (116 lb)] 52.6 kg (116 lb) (06/21 0955)   Labs:   Estimated body mass index is 19.91 kg/m as calculated from the following:   Height as of 02/01/18: 5\' 4"  (1.626 m).   Weight as of 02/01/18: 52.6 kg (116 lb).  Imaging Review:  Plain radiographs demonstrate  subsidence of the cement mantle and medialization of the femoral head as well as sclerosis of the acetabulum   Preoperative templating of the joint replacement has been completed, documented, and submitted to the Operating Room personnel in order to optimize intra-operative equipment management.   Assessment/Plan:  End stage arthritis, right hip(s) with failed previous arthroplasty.  The patient history, physical examination, clinical judgement of the provider and imaging studies are consistent with end stage degenerative joint disease of the right hip(s), previous total hip arthroplasty. Revision total hip arthroplasty is deemed medically necessary. The treatment options including medical management, injection therapy, arthroscopy and arthroplasty were discussed at length. The risks and benefits of total hip arthroplasty were presented and reviewed. The risks due to aseptic loosening, infection, stiffness, dislocation/subluxation,  thromboembolic complications and other imponderables were discussed.  The patient acknowledged the explanation, agreed to proceed with the plan and consent was  signed. Patient is being admitted for inpatient treatment for surgery, pain control, PT, OT, prophylactic antibiotics, VTE prophylaxis, progressive ambulation and ADL's and discharge planning. The patient is planning to be discharged home with home health services.

## 2018-02-03 MED ORDER — BUPIVACAINE LIPOSOME 1.3 % IJ SUSP
20.0000 mL | INTRAMUSCULAR | Status: DC
  Filled 2018-02-03: qty 20

## 2018-02-03 MED ORDER — TRANEXAMIC ACID 1000 MG/10ML IV SOLN
2000.0000 mg | INTRAVENOUS | Status: DC
  Filled 2018-02-03: qty 20

## 2018-02-04 ENCOUNTER — Encounter (HOSPITAL_COMMUNITY): Payer: Self-pay | Admitting: Registered Nurse

## 2018-02-04 ENCOUNTER — Inpatient Hospital Stay (HOSPITAL_COMMUNITY): Payer: Medicare HMO

## 2018-02-04 ENCOUNTER — Inpatient Hospital Stay (HOSPITAL_COMMUNITY)
Admission: RE | Admit: 2018-02-04 | Discharge: 2018-02-12 | DRG: 467 | Disposition: A | Discharge status: Skilled Nursing Facility | Payer: Medicare HMO | Attending: Orthopedic Surgery | Admitting: Orthopedic Surgery

## 2018-02-04 ENCOUNTER — Inpatient Hospital Stay (HOSPITAL_COMMUNITY): Payer: Medicare HMO | Admitting: Registered Nurse

## 2018-02-04 ENCOUNTER — Encounter (HOSPITAL_COMMUNITY)
Admission: RE | Disposition: A | Discharge status: Skilled Nursing Facility | Payer: Self-pay | Source: Home / Self Care | Attending: Orthopedic Surgery

## 2018-02-04 DIAGNOSIS — I10 Essential (primary) hypertension: Secondary | ICD-10-CM | POA: Diagnosis not present

## 2018-02-04 DIAGNOSIS — H409 Unspecified glaucoma: Secondary | ICD-10-CM | POA: Diagnosis present

## 2018-02-04 DIAGNOSIS — Y792 Prosthetic and other implants, materials and accessory orthopedic devices associated with adverse incidents: Secondary | ICD-10-CM | POA: Diagnosis present

## 2018-02-04 DIAGNOSIS — H4010X0 Unspecified open-angle glaucoma, stage unspecified: Secondary | ICD-10-CM | POA: Diagnosis not present

## 2018-02-04 DIAGNOSIS — D751 Secondary polycythemia: Secondary | ICD-10-CM | POA: Diagnosis not present

## 2018-02-04 DIAGNOSIS — Z8249 Family history of ischemic heart disease and other diseases of the circulatory system: Secondary | ICD-10-CM | POA: Diagnosis not present

## 2018-02-04 DIAGNOSIS — Z743 Need for continuous supervision: Secondary | ICD-10-CM | POA: Diagnosis not present

## 2018-02-04 DIAGNOSIS — R41841 Cognitive communication deficit: Secondary | ICD-10-CM | POA: Diagnosis not present

## 2018-02-04 DIAGNOSIS — T84018A Broken internal joint prosthesis, other site, initial encounter: Secondary | ICD-10-CM | POA: Diagnosis not present

## 2018-02-04 DIAGNOSIS — Z7982 Long term (current) use of aspirin: Secondary | ICD-10-CM

## 2018-02-04 DIAGNOSIS — R293 Abnormal posture: Secondary | ICD-10-CM | POA: Diagnosis not present

## 2018-02-04 DIAGNOSIS — Z87891 Personal history of nicotine dependence: Secondary | ICD-10-CM | POA: Diagnosis not present

## 2018-02-04 DIAGNOSIS — T84030A Mechanical loosening of internal right hip prosthetic joint, initial encounter: Secondary | ICD-10-CM | POA: Diagnosis not present

## 2018-02-04 DIAGNOSIS — D62 Acute posthemorrhagic anemia: Secondary | ICD-10-CM | POA: Diagnosis not present

## 2018-02-04 DIAGNOSIS — R262 Difficulty in walking, not elsewhere classified: Secondary | ICD-10-CM | POA: Diagnosis not present

## 2018-02-04 DIAGNOSIS — Z471 Aftercare following joint replacement surgery: Secondary | ICD-10-CM | POA: Diagnosis not present

## 2018-02-04 DIAGNOSIS — Z96641 Presence of right artificial hip joint: Secondary | ICD-10-CM | POA: Diagnosis not present

## 2018-02-04 DIAGNOSIS — R9431 Abnormal electrocardiogram [ECG] [EKG]: Secondary | ICD-10-CM | POA: Diagnosis present

## 2018-02-04 DIAGNOSIS — Z79899 Other long term (current) drug therapy: Secondary | ICD-10-CM | POA: Diagnosis not present

## 2018-02-04 DIAGNOSIS — D45 Polycythemia vera: Secondary | ICD-10-CM | POA: Diagnosis present

## 2018-02-04 DIAGNOSIS — M1611 Unilateral primary osteoarthritis, right hip: Secondary | ICD-10-CM | POA: Diagnosis present

## 2018-02-04 DIAGNOSIS — M25551 Pain in right hip: Secondary | ICD-10-CM | POA: Diagnosis present

## 2018-02-04 DIAGNOSIS — M879 Osteonecrosis, unspecified: Secondary | ICD-10-CM | POA: Diagnosis present

## 2018-02-04 DIAGNOSIS — R279 Unspecified lack of coordination: Secondary | ICD-10-CM | POA: Diagnosis not present

## 2018-02-04 DIAGNOSIS — M1990 Unspecified osteoarthritis, unspecified site: Secondary | ICD-10-CM | POA: Diagnosis present

## 2018-02-04 DIAGNOSIS — Z96649 Presence of unspecified artificial hip joint: Secondary | ICD-10-CM

## 2018-02-04 DIAGNOSIS — D473 Essential (hemorrhagic) thrombocythemia: Secondary | ICD-10-CM | POA: Diagnosis not present

## 2018-02-04 DIAGNOSIS — M6281 Muscle weakness (generalized): Secondary | ICD-10-CM | POA: Diagnosis not present

## 2018-02-04 HISTORY — PX: TOTAL HIP REVISION: SHX763

## 2018-02-04 LAB — HEMOGLOBIN AND HEMATOCRIT, BLOOD
HCT: 30.6 % — ABNORMAL LOW (ref 36.0–46.0)
HEMOGLOBIN: 8.1 g/dL — AB (ref 12.0–15.0)

## 2018-02-04 LAB — PREPARE RBC (CROSSMATCH)

## 2018-02-04 SURGERY — TOTAL HIP REVISION
Anesthesia: General | Site: Hip | Laterality: Right

## 2018-02-04 MED ORDER — BENAZEPRIL HCL 10 MG PO TABS
40.0000 mg | ORAL_TABLET | Freq: Every day | ORAL | Status: DC
  Administered 2018-02-05 – 2018-02-12 (×8): 40 mg via ORAL
  Filled 2018-02-04 (×8): qty 4

## 2018-02-04 MED ORDER — FENTANYL CITRATE (PF) 100 MCG/2ML IJ SOLN
INTRAMUSCULAR | Status: AC
  Filled 2018-02-04: qty 2

## 2018-02-04 MED ORDER — MECLIZINE HCL 25 MG PO TABS: 25.0000 mg | ORAL_TABLET | Freq: Three times a day (TID) | ORAL | Status: DC | PRN

## 2018-02-04 MED ORDER — ESMOLOL HCL 100 MG/10ML IV SOLN
INTRAVENOUS | Status: DC | PRN
  Administered 2018-02-04 (×3): 20 mg via INTRAVENOUS

## 2018-02-04 MED ORDER — ASPIRIN 81 MG PO CHEW
81.0000 mg | CHEWABLE_TABLET | Freq: Two times a day (BID) | ORAL | Status: DC
  Administered 2018-02-04 – 2018-02-06 (×5): 81 mg via ORAL
  Filled 2018-02-04 (×5): qty 1

## 2018-02-04 MED ORDER — DEXAMETHASONE SODIUM PHOSPHATE 10 MG/ML IJ SOLN
INTRAMUSCULAR | Status: AC
  Filled 2018-02-04: qty 1

## 2018-02-04 MED ORDER — FENTANYL CITRATE (PF) 250 MCG/5ML IJ SOLN
INTRAMUSCULAR | Status: DC | PRN
  Administered 2018-02-04: 25 ug via INTRAVENOUS
  Administered 2018-02-04 (×4): 50 ug via INTRAVENOUS
  Administered 2018-02-04: 25 ug via INTRAVENOUS
  Administered 2018-02-04: 50 ug via INTRAVENOUS

## 2018-02-04 MED ORDER — FLEET ENEMA 7-19 GM/118ML RE ENEM: 1.0000 | ENEMA | Freq: Once | RECTAL | Status: DC | PRN

## 2018-02-04 MED ORDER — BUPIVACAINE LIPOSOME 1.3 % IJ SUSP
INTRAMUSCULAR | Status: DC | PRN
  Administered 2018-02-04: 20 mL

## 2018-02-04 MED ORDER — OXYCODONE HCL 5 MG PO TABS: 5.0000 mg | ORAL_TABLET | Freq: Once | ORAL | Status: DC | PRN

## 2018-02-04 MED ORDER — DEXAMETHASONE SODIUM PHOSPHATE 10 MG/ML IJ SOLN
INTRAMUSCULAR | Status: DC | PRN
Start: 2018-02-04 — End: 2018-02-04
  Administered 2018-02-04: 10 mg via INTRAVENOUS

## 2018-02-04 MED ORDER — SUGAMMADEX SODIUM 200 MG/2ML IV SOLN
INTRAVENOUS | Status: AC
  Filled 2018-02-04: qty 2

## 2018-02-04 MED ORDER — HYDROXYUREA 500 MG PO CAPS
1000.0000 mg | ORAL_CAPSULE | ORAL | Status: DC
  Administered 2018-02-05 – 2018-02-09 (×3): 1000 mg via ORAL
  Filled 2018-02-04 (×3): qty 2

## 2018-02-04 MED ORDER — ROCURONIUM BROMIDE 10 MG/ML (PF) SYRINGE
PREFILLED_SYRINGE | INTRAVENOUS | Status: DC | PRN
  Administered 2018-02-04: 10 mg via INTRAVENOUS
  Administered 2018-02-04: 50 mg via INTRAVENOUS

## 2018-02-04 MED ORDER — HYDROMORPHONE HCL 1 MG/ML IJ SOLN
INTRAMUSCULAR | Status: AC
  Filled 2018-02-04: qty 2

## 2018-02-04 MED ORDER — SODIUM CHLORIDE 0.9 % IJ SOLN
INTRAMUSCULAR | Status: AC
  Filled 2018-02-04: qty 50

## 2018-02-04 MED ORDER — LIDOCAINE 2% (20 MG/ML) 5 ML SYRINGE
INTRAMUSCULAR | Status: AC
  Filled 2018-02-04: qty 5

## 2018-02-04 MED ORDER — PHENOL 1.4 % MT LIQD: 1.0000 | OROMUCOSAL | Status: DC | PRN

## 2018-02-04 MED ORDER — CELECOXIB 200 MG PO CAPS
200.0000 mg | ORAL_CAPSULE | Freq: Two times a day (BID) | ORAL | Status: DC
  Administered 2018-02-04 – 2018-02-12 (×16): 200 mg via ORAL
  Filled 2018-02-04 (×16): qty 1

## 2018-02-04 MED ORDER — LORATADINE 10 MG PO TABS: 10.0000 mg | ORAL_TABLET | Freq: Every day | ORAL | Status: DC | PRN

## 2018-02-04 MED ORDER — METHOCARBAMOL 1000 MG/10ML IJ SOLN
500.0000 mg | Freq: Four times a day (QID) | INTRAVENOUS | Status: DC | PRN
  Filled 2018-02-04: qty 5

## 2018-02-04 MED ORDER — OXYCODONE HCL 5 MG PO TABS
5.0000 mg | ORAL_TABLET | ORAL | Status: DC | PRN
  Administered 2018-02-05 – 2018-02-07 (×3): 5 mg via ORAL
  Administered 2018-02-08: 10 mg via ORAL
  Administered 2018-02-08 – 2018-02-09 (×2): 5 mg via ORAL
  Filled 2018-02-04: qty 1
  Filled 2018-02-04: qty 2
  Filled 2018-02-04 (×3): qty 1
  Filled 2018-02-04: qty 2
  Filled 2018-02-04: qty 1

## 2018-02-04 MED ORDER — POLYETHYLENE GLYCOL 3350 17 G PO PACK: 17.0000 g | PACK | Freq: Every day | ORAL | Status: DC | PRN

## 2018-02-04 MED ORDER — PROPOFOL 10 MG/ML IV BOLUS
INTRAVENOUS | Status: AC
  Filled 2018-02-04: qty 20

## 2018-02-04 MED ORDER — AMLODIPINE BESYLATE 10 MG PO TABS
10.0000 mg | ORAL_TABLET | Freq: Every day | ORAL | Status: DC
  Administered 2018-02-05 – 2018-02-12 (×8): 10 mg via ORAL
  Filled 2018-02-04 (×8): qty 1

## 2018-02-04 MED ORDER — SUGAMMADEX SODIUM 200 MG/2ML IV SOLN
INTRAVENOUS | Status: DC | PRN
  Administered 2018-02-04: 100 mg via INTRAVENOUS

## 2018-02-04 MED ORDER — BUPIVACAINE-EPINEPHRINE 0.5% -1:200000 IJ SOLN
INTRAMUSCULAR | Status: DC | PRN
  Administered 2018-02-04: 30 mL

## 2018-02-04 MED ORDER — TRANEXAMIC ACID 1000 MG/10ML IV SOLN
1000.0000 mg | Freq: Once | INTRAVENOUS | Status: AC
  Administered 2018-02-04: 1000 mg via INTRAVENOUS
  Filled 2018-02-04: qty 1100

## 2018-02-04 MED ORDER — ALBUMIN HUMAN 5 % IV SOLN
INTRAVENOUS | Status: AC
  Filled 2018-02-04: qty 500

## 2018-02-04 MED ORDER — METOCLOPRAMIDE HCL 5 MG/ML IJ SOLN: 5.0000 mg | Freq: Three times a day (TID) | INTRAMUSCULAR | Status: DC | PRN

## 2018-02-04 MED ORDER — TIZANIDINE HCL 2 MG PO TABS: 2.0000 mg | ORAL_TABLET | Freq: Four times a day (QID) | ORAL | 0 refills | Status: DC | PRN

## 2018-02-04 MED ORDER — ONDANSETRON HCL 4 MG/2ML IJ SOLN: 4.0000 mg | Freq: Four times a day (QID) | INTRAMUSCULAR | Status: DC | PRN

## 2018-02-04 MED ORDER — ALPRAZOLAM 0.25 MG PO TABS: 0.2500 mg | ORAL_TABLET | Freq: Two times a day (BID) | ORAL | Status: DC | PRN

## 2018-02-04 MED ORDER — ACETAMINOPHEN 325 MG PO TABS
325.0000 mg | ORAL_TABLET | Freq: Four times a day (QID) | ORAL | Status: DC | PRN
  Administered 2018-02-05 – 2018-02-08 (×2): 650 mg via ORAL
  Administered 2018-02-10: 325 mg via ORAL
  Administered 2018-02-11 – 2018-02-12 (×2): 650 mg via ORAL
  Filled 2018-02-04: qty 2
  Filled 2018-02-04: qty 1
  Filled 2018-02-04 (×4): qty 2

## 2018-02-04 MED ORDER — PROPOFOL 10 MG/ML IV BOLUS
INTRAVENOUS | Status: DC | PRN
  Administered 2018-02-04: 140 mg via INTRAVENOUS

## 2018-02-04 MED ORDER — HYDROMORPHONE HCL 1 MG/ML IJ SOLN: 0.5000 mg | INTRAMUSCULAR | Status: DC | PRN

## 2018-02-04 MED ORDER — FENTANYL CITRATE (PF) 250 MCG/5ML IJ SOLN
INTRAMUSCULAR | Status: AC
  Filled 2018-02-04: qty 5

## 2018-02-04 MED ORDER — HYDROMORPHONE HCL 1 MG/ML IJ SOLN: 0.2500 mg | INTRAMUSCULAR | Status: DC | PRN

## 2018-02-04 MED ORDER — ONDANSETRON HCL 4 MG/2ML IJ SOLN
INTRAMUSCULAR | Status: AC
  Filled 2018-02-04: qty 2

## 2018-02-04 MED ORDER — MENTHOL 3 MG MT LOZG: 1.0000 | LOZENGE | OROMUCOSAL | Status: DC | PRN

## 2018-02-04 MED ORDER — SODIUM CHLORIDE 0.9 % IJ SOLN
INTRAMUSCULAR | Status: DC | PRN
  Administered 2018-02-04: 50 mL

## 2018-02-04 MED ORDER — CEFAZOLIN SODIUM-DEXTROSE 2-4 GM/100ML-% IV SOLN
2.0000 g | INTRAVENOUS | Status: AC
  Administered 2018-02-04: 2 g via INTRAVENOUS
  Filled 2018-02-04: qty 100

## 2018-02-04 MED ORDER — DEXAMETHASONE SODIUM PHOSPHATE 10 MG/ML IJ SOLN
10.0000 mg | Freq: Once | INTRAMUSCULAR | Status: AC
  Administered 2018-02-05: 10 mg via INTRAVENOUS
  Filled 2018-02-04: qty 1

## 2018-02-04 MED ORDER — KCL IN DEXTROSE-NACL 20-5-0.45 MEQ/L-%-% IV SOLN
INTRAVENOUS | Status: DC
  Administered 2018-02-04 – 2018-02-05 (×2): via INTRAVENOUS
  Filled 2018-02-04 (×4): qty 1000

## 2018-02-04 MED ORDER — ALUM & MAG HYDROXIDE-SIMETH 200-200-20 MG/5ML PO SUSP: 30.0000 mL | ORAL | Status: DC | PRN

## 2018-02-04 MED ORDER — PANTOPRAZOLE SODIUM 40 MG PO TBEC
40.0000 mg | DELAYED_RELEASE_TABLET | Freq: Every day | ORAL | Status: DC
  Administered 2018-02-04 – 2018-02-12 (×9): 40 mg via ORAL
  Filled 2018-02-04 (×9): qty 1

## 2018-02-04 MED ORDER — SODIUM CHLORIDE 0.9% IV SOLUTION
Freq: Once | INTRAVENOUS | Status: AC
  Administered 2018-02-05: 02:00:00 via INTRAVENOUS

## 2018-02-04 MED ORDER — SODIUM CHLORIDE 0.9 % IV SOLN
INTRAVENOUS | Status: DC | PRN
  Administered 2018-02-04: 15:00:00 via INTRAVENOUS

## 2018-02-04 MED ORDER — METHOCARBAMOL 500 MG PO TABS
500.0000 mg | ORAL_TABLET | Freq: Four times a day (QID) | ORAL | Status: DC | PRN
  Administered 2018-02-06 – 2018-02-11 (×3): 500 mg via ORAL
  Filled 2018-02-04 (×3): qty 1

## 2018-02-04 MED ORDER — TRANEXAMIC ACID 1000 MG/10ML IV SOLN
INTRAVENOUS | Status: DC | PRN
  Administered 2018-02-04: 2000 mg via TOPICAL

## 2018-02-04 MED ORDER — CHLORHEXIDINE GLUCONATE 4 % EX LIQD
60.0000 mL | Freq: Once | CUTANEOUS | Status: AC
  Administered 2018-02-04: 4 via TOPICAL

## 2018-02-04 MED ORDER — ROCURONIUM BROMIDE 100 MG/10ML IV SOLN
INTRAVENOUS | Status: AC
  Filled 2018-02-04: qty 1

## 2018-02-04 MED ORDER — BUPIVACAINE-EPINEPHRINE (PF) 0.5% -1:200000 IJ SOLN
INTRAMUSCULAR | Status: AC
  Filled 2018-02-04: qty 30

## 2018-02-04 MED ORDER — BISACODYL 5 MG PO TBEC: 5.0000 mg | DELAYED_RELEASE_TABLET | Freq: Every day | ORAL | Status: DC | PRN

## 2018-02-04 MED ORDER — METOCLOPRAMIDE HCL 5 MG PO TABS: 5.0000 mg | ORAL_TABLET | Freq: Three times a day (TID) | ORAL | Status: DC | PRN

## 2018-02-04 MED ORDER — ONDANSETRON HCL 4 MG/2ML IJ SOLN
INTRAMUSCULAR | Status: DC | PRN
  Administered 2018-02-04: 4 mg via INTRAVENOUS

## 2018-02-04 MED ORDER — HYDROXYUREA 500 MG PO CAPS
500.0000 mg | ORAL_CAPSULE | ORAL | Status: DC
  Administered 2018-02-06 – 2018-02-08 (×2): 500 mg via ORAL
  Filled 2018-02-04 (×2): qty 1

## 2018-02-04 MED ORDER — LACTATED RINGERS IV SOLN
INTRAVENOUS | Status: DC
  Administered 2018-02-04: 12:00:00 via INTRAVENOUS

## 2018-02-04 MED ORDER — DOCUSATE SODIUM 100 MG PO CAPS
100.0000 mg | ORAL_CAPSULE | Freq: Two times a day (BID) | ORAL | Status: DC
  Administered 2018-02-04 – 2018-02-12 (×16): 100 mg via ORAL
  Filled 2018-02-04 (×16): qty 1

## 2018-02-04 MED ORDER — DIPHENHYDRAMINE HCL 12.5 MG/5ML PO ELIX: 12.5000 mg | ORAL_SOLUTION | ORAL | Status: DC | PRN

## 2018-02-04 MED ORDER — ALBUMIN HUMAN 5 % IV SOLN
INTRAVENOUS | Status: DC | PRN
  Administered 2018-02-04 (×2): via INTRAVENOUS

## 2018-02-04 MED ORDER — LIDOCAINE 2% (20 MG/ML) 5 ML SYRINGE
INTRAMUSCULAR | Status: DC | PRN
  Administered 2018-02-04: 50 mg via INTRAVENOUS

## 2018-02-04 MED ORDER — PROMETHAZINE HCL 25 MG/ML IJ SOLN: 6.2500 mg | INTRAMUSCULAR | Status: DC | PRN

## 2018-02-04 MED ORDER — LATANOPROST 0.005 % OP SOLN
1.0000 [drp] | Freq: Every day | OPHTHALMIC | Status: DC
  Administered 2018-02-05 – 2018-02-11 (×7): 1 [drp] via OPHTHALMIC
  Filled 2018-02-04: qty 2.5

## 2018-02-04 MED ORDER — ATORVASTATIN CALCIUM 10 MG PO TABS
10.0000 mg | ORAL_TABLET | Freq: Every day | ORAL | Status: DC
  Administered 2018-02-04 – 2018-02-11 (×8): 10 mg via ORAL
  Filled 2018-02-04 (×9): qty 1

## 2018-02-04 MED ORDER — TRANEXAMIC ACID 1000 MG/10ML IV SOLN
1000.0000 mg | INTRAVENOUS | Status: AC
  Administered 2018-02-04: 1000 mg via INTRAVENOUS
  Filled 2018-02-04: qty 1100

## 2018-02-04 MED ORDER — HYDROXYUREA 500 MG PO CAPS: 500.0000 mg | ORAL_CAPSULE | ORAL | Status: DC

## 2018-02-04 MED ORDER — OXYCODONE-ACETAMINOPHEN 5-325 MG PO TABS: 1.0000 | ORAL_TABLET | ORAL | 0 refills | Status: DC | PRN

## 2018-02-04 MED ORDER — LACTATED RINGERS IV SOLN
INTRAVENOUS | Status: DC | PRN
  Administered 2018-02-04: 16:00:00 via INTRAVENOUS

## 2018-02-04 MED ORDER — ONDANSETRON HCL 4 MG PO TABS: 4.0000 mg | ORAL_TABLET | Freq: Four times a day (QID) | ORAL | Status: DC | PRN

## 2018-02-04 MED ORDER — ACETAMINOPHEN 500 MG PO TABS
1000.0000 mg | ORAL_TABLET | Freq: Four times a day (QID) | ORAL | Status: AC
  Administered 2018-02-04 – 2018-02-05 (×4): 1000 mg via ORAL
  Filled 2018-02-04 (×4): qty 2

## 2018-02-04 MED ORDER — ASPIRIN EC 81 MG PO TBEC: 81.0000 mg | DELAYED_RELEASE_TABLET | Freq: Two times a day (BID) | ORAL | 0 refills | Status: DC

## 2018-02-04 MED ORDER — OXYCODONE HCL 5 MG/5ML PO SOLN
5.0000 mg | Freq: Once | ORAL | Status: DC | PRN
  Filled 2018-02-04: qty 5

## 2018-02-04 MED ORDER — GABAPENTIN 300 MG PO CAPS
300.0000 mg | ORAL_CAPSULE | Freq: Three times a day (TID) | ORAL | Status: DC
  Administered 2018-02-04 – 2018-02-12 (×23): 300 mg via ORAL
  Filled 2018-02-04 (×23): qty 1

## 2018-02-04 SURGICAL SUPPLY — 61 items
BIT DRILL 2.8 QUICK RELEASE (BIT) IMPLANT
BLADE SAW SGTL 73X25 THK (BLADE) ×2 IMPLANT
BRUSH FEMORAL CANAL (MISCELLANEOUS) IMPLANT
CAPT HIP TOTAL 2 ×1 IMPLANT
CATH FOLEY 2WAY SLVR  5CC 12FR (CATHETERS) ×1
CATH FOLEY 2WAY SLVR 5CC 12FR (CATHETERS) IMPLANT
COVER SURGICAL LIGHT HANDLE (MISCELLANEOUS) ×2 IMPLANT
DRAPE C-ARM 42X120 X-RAY (DRAPES) ×2 IMPLANT
DRAPE INCISE IOBAN 66X45 STRL (DRAPES) ×1 IMPLANT
DRAPE ORTHO SPLIT 77X108 STRL (DRAPES) ×4
DRAPE POUCH INSTRU U-SHP 10X18 (DRAPES) ×2 IMPLANT
DRAPE SHEET LG 3/4 BI-LAMINATE (DRAPES) ×2 IMPLANT
DRAPE SURG ORHT 6 SPLT 77X108 (DRAPES) ×2 IMPLANT
DRAPE U-SHAPE 47X51 STRL (DRAPES) ×2 IMPLANT
DRILL 2.8 QUICK RELEASE (BIT) ×2
DRSG AQUACEL AG ADV 3.5X10 (GAUZE/BANDAGES/DRESSINGS) ×1 IMPLANT
DRSG PAD ABDOMINAL 8X10 ST (GAUZE/BANDAGES/DRESSINGS) IMPLANT
DURAPREP 26ML APPLICATOR (WOUND CARE) ×2 IMPLANT
ELECT BLADE TIP CTD 4 INCH (ELECTRODE) ×1 IMPLANT
ELECT REM PT RETURN 15FT ADLT (MISCELLANEOUS) ×2 IMPLANT
EVACUATOR 1/8 PVC DRAIN (DRAIN) ×1 IMPLANT
FACESHIELD WRAPAROUND (MASK) ×2 IMPLANT
FACESHIELD WRAPAROUND OR TEAM (MASK) ×3 IMPLANT
GAUZE SPONGE 4X4 12PLY STRL (GAUZE/BANDAGES/DRESSINGS) ×2 IMPLANT
GAUZE XEROFORM 5X9 LF (GAUZE/BANDAGES/DRESSINGS) ×1 IMPLANT
GLOVE BIO SURGEON STRL SZ7 (GLOVE) ×2 IMPLANT
GLOVE BIOGEL PI IND STRL 7.0 (GLOVE) ×1 IMPLANT
GLOVE BIOGEL PI IND STRL 8 (GLOVE) ×1 IMPLANT
GLOVE BIOGEL PI INDICATOR 7.0 (GLOVE) ×1
GLOVE BIOGEL PI INDICATOR 8 (GLOVE) ×1
HANDPIECE INTERPULSE COAX TIP (DISPOSABLE) ×2
HOOD PEEL AWAY FLYTE STAYCOOL (MISCELLANEOUS) ×5 IMPLANT
IMMOBILIZER KNEE 20 (SOFTGOODS) ×2
IMMOBILIZER KNEE 20 THIGH 36 (SOFTGOODS) ×1 IMPLANT
IV NS IRRIG 3000ML ARTHROMATIC (IV SOLUTION) ×1 IMPLANT
KIT BASIN OR (CUSTOM PROCEDURE TRAY) ×1 IMPLANT
NDL MAYO CATGUT SZ4 TPR NDL (NEEDLE) ×1 IMPLANT
NEEDLE HYPO 22GX1.5 SAFETY (NEEDLE) ×1 IMPLANT
NEEDLE MAYO CATGUT SZ4 (NEEDLE) IMPLANT
NS IRRIG 1000ML POUR BTL (IV SOLUTION) ×3 IMPLANT
PASSER SUT SWANSON 36MM LOOP (INSTRUMENTS) ×2 IMPLANT
POSITIONER SURGICAL ARM (MISCELLANEOUS) ×2 IMPLANT
SET HNDPC FAN SPRY TIP SCT (DISPOSABLE) IMPLANT
SPONGE LAP 18X18 RF (DISPOSABLE) ×1 IMPLANT
STAPLER VISISTAT 35W (STAPLE) ×1 IMPLANT
SUCTION FRAZIER HANDLE 12FR (TUBING)
SUCTION TUBE FRAZIER 12FR DISP (TUBING) ×1 IMPLANT
SUT ETHIBOND NAB CT1 #1 30IN (SUTURE) ×3 IMPLANT
SUT NYLON 3 0 (SUTURE) ×2 IMPLANT
SUT VIC AB 0 CT2 27 (SUTURE) ×2 IMPLANT
SUT VIC AB 1 CT1 36 (SUTURE) ×4 IMPLANT
SUT VIC AB 2-0 CT1 27 (SUTURE) ×2
SUT VIC AB 2-0 CT1 TAPERPNT 27 (SUTURE) ×2 IMPLANT
SUT VIC AB 3-0 CT1 27 (SUTURE) ×2
SUT VIC AB 3-0 CT1 TAPERPNT 27 (SUTURE) IMPLANT
SWAB COLLECTION DEVICE MRSA (MISCELLANEOUS) ×2 IMPLANT
SWAB CULTURE ESWAB REG 1ML (MISCELLANEOUS) ×2 IMPLANT
SYR 10ML LL (SYRINGE) ×3 IMPLANT
TOWER CARTRIDGE SMART MIX (DISPOSABLE) IMPLANT
TRAY FOLEY MTR SLVR 16FR STAT (SET/KITS/TRAYS/PACK) ×1 IMPLANT
WATER STERILE IRR 1000ML POUR (IV SOLUTION) ×3 IMPLANT

## 2018-02-04 NOTE — Op Note (Signed)
PATIENT ID:      Alejandra [name]  MRN:     [id number] DOB/AGE:    1941/02/05 / 77 y.o.       OPERATIVE REPORT    DATE OF PROCEDURE:  02/04/2018       PREOPERATIVE DIAGNOSIS:  LOOSE RIGHT TOTAL HIP ARTHROPLASTY                                                       Estimated body mass index is 19.91 kg/m as calculated from the following:   Height as of this encounter: 5\' 4"  (1.626 m).   Weight as of this encounter: 116 lb (52.6 kg).     POSTOPERATIVE DIAGNOSIS:  LOOSE RIGHT TOTAL HIP ARTHROPLASTY                                                           PROCEDURE: Removal of grossly loose left hip arthroplasty followed by revision to a 50 mm DePuy Pinnacle shell with an apex hole eliminator 10 degree polyethylene liner index posterior superior.  On the femoral side 18 x 13 x 1 50 x 36 S-ROM stem, 18 F large sleeve +3x32 mm metal head.  SURGEON: Kerin Salen    ASSISTANT:   Kerry Hough. Barton Dubois  (present throughout entire procedure and necessary for timely completion of the procedure)  ANESTHESIA: General endotracheal BLOOD LOSS: 1100 cc FLUID REPLACEMENT: 2200 cc crystalloid, 500 cc albumin Tranexamic Acid: 2 g topical during the case and 1 g IV at the end of the case for persistent oozing. DRAINS: None COMPLICATIONS: None    INDICATIONS FOR PROCEDURE: 77 year old female who underwent hip replacement for a femoral neck fracture 7 years ago with another physician.  Unfortunately the implants have come loose there is been obvious migration by x-ray and the patient desires elective revision to decrease pain increase function.  Her work-up is included x-ray showing at least 1 inch of migration of the implants inflammatory markers have been negative and there is been no sign of infection.  The risks and benefits of surgery been discussed at length.  Comorbidities include polycythemia vera although her hemoglobin preop was 10.9.  She has been cleared for surgery by hematology.    PROCEDURE IN  DETAIL: The patient was identified by armband,  received preoperative IV antibiotics in the holding area at [facility], taken to the operating room , appropriate anesthetic monitors  were attached and general endotracheal anesthesia induced. Foley catheter was inserted. Patient was rolled into the L lateral decubitus position and fixed there with a Stulberg Mark II pelvic clamp and the R lower extremity was then prepped and draped  in the usual sterile fashion from the ankle to the hemipelvis. A time-out  procedure was performed. The skin along the lateral hip and thigh  infiltrated with 10 mL of 0.5% Marcaine and epinephrine solution. We  then made a posterolateral approach to the hip. With a #10 blade, 15 cm  incision through skin and subcutaneous tissue down to the level of the  IT band. Small bleeders were identified and cauterized. IT band cut in  line with skin incision exposing the greater trochanter.  Using electrocautery we developed a posterior flap of pseudocapsule peeling off the intertrochanteric line and then cutting back to the acetabulum superiorly and inferiorly the flap was tagged with two #2 Ethibond sutures.  This exposed the femoral head component.  With traction flexion and internal rotation were able to dislocate the femoral head component and the stem was noted to be grossly loose and wiggling consistent with the radiographs that showed subsidence and lucency between the cement mantle and the stem.  Using a metal cylinder mallet we remove the femoral head we then developed the interval between the loose femoral component cement and the bone using osteotomes from the [facility] set small Alpha.  Once we did clear the entire rim around the collar stem itself was removed using the S-ROM femoral neck snare and slaphammer.  Using the [facility] instruments that we then removed the cement mantle and we are fortunate in that the distal 5 cm came out as one piece all the way down to  the cement restrictor.  Under C-arm imaging control we then drilled a hole through the cement restrictor centrally with a 4 mm drill when we attempted to drill through the 6 mm drill the cement restrictor actually went down towards the knee and was now clear of the field.  At this point we noticed some significant bruising probably secondary to her hematologic status and a TXA soaked sponge was placed in the femoral canal.  We then directed our attention to the acetabulum which we sequentially reamed up to 49 mm obtaining good bite in all quadrants.  We then selected a 50 mm DePuy Pinnacle shell with no holes and hammered into the place obtaining good room lock.  We uses a C arm to confirm good position of the acetabular component including abduction and anteversion.  The apex hole limiter was placed.  Followed by a 10 degree liner index posterior and superior.  We then directed attention back to the femur and sequentially reamed up to a 13.5 mm cylindrical reamer full depth 14 mm two thirds depth.  We then conically reamed up to an 18 F sleeve to the appropriate depth for a 42 neck.  This is followed by conical milling up to an 18 F large cone.  Because of the deformity of the neck from the loose stem the anteversion of the bone was only about 10 degrees and this will be compensated for when the stem was placed inside the sleeve.  We then hammered into place an 8 F large trial sleeve followed by a trial stem and 10 degrees of anteversion in relation to the sleeve with a 42 neck and a +0 head.  We were able to reduce this but it was quite tight the patient started out about an inch short and we then drop down to 36 neck with a +332 mm trial head and this had the correct tension.  There was stable to 90 of flexion and 75 of internal rotation and could not be dislocated in extension with external rotation.  Trial components removed the femur was irrigated out normal saline solution the real implants and 18 F large  S-ROM sleeve was hammered into place.  Through the sleeve we placed a 13.5 reamer one more time followed by the 18 x 13 x 1 50 x 36 stem and 10 degrees of anteversion in relation to the sleeve with a total anteversion of about 25 degrees.  A +3  metal head was hammered onto the stem the hip reduced and stability checked one more time and felt to be excellent.  The wound was irrigated out with normal saline solution we again noted that the wound itself was oozing very similar to a patient had been on high-dose aspirin but her disease was polycythemia vera.  At this time a made a decision to go ahead and give her 1 g of trans-Amick acid.  Soft tissues were then infiltrated with Exparel we closed in layers with running #1 Vicryl suture and the IT band subcutaneous 2-0 undyed Vicryl suture in the skin with running subcuticular 3-0 Vicryl suture.  Aquacil dressing was then placed.  Patient was unclamped rolled supine awaken extubated and taken to the recovery room without difficulty.  Kerin Salen 02/04/2018, 4:36 PM

## 2018-02-04 NOTE — Transfer of Care (Signed)
Immediate Anesthesia Transfer of Care Note  Patient: Alejandra [name]  Procedure(s) Performed: RIGHT TOTAL HIP REVISION: REVISION OF RIGHT MONOPOLAR/CONVERSION TO TOTAL HIP (Right Hip)  Patient Location: PACU  Anesthesia Type:General  Level of Consciousness: drowsy and patient cooperative  Airway & Oxygen Therapy: Patient Spontanous Breathing and Patient connected to face mask oxygen  Post-op Assessment: Report given to RN and Post -op Vital signs reviewed and stable  Post vital signs: Reviewed and stable  Last Vitals:  Vitals Value Taken Time  BP 120/63 02/04/2018  5:22 PM  Temp    Pulse 73 02/04/2018  5:27 PM  Resp 10 02/04/2018  5:27 PM  SpO2 100 % 02/04/2018  5:27 PM  Vitals shown include unvalidated device data.  Last Pain:  Vitals:   02/04/18 1109  TempSrc: Oral  PainSc: 5       Patients Stated Pain Goal: 5 (44/92/01 0071)  Complications: No apparent anesthesia complications

## 2018-02-04 NOTE — Anesthesia Preprocedure Evaluation (Signed)
Anesthesia Evaluation  Patient identified by MRN, date of birth, ID band Patient awake    Reviewed: Allergy & Precautions, H&P , NPO status , Patient's Chart, lab work & pertinent test results  History of Anesthesia Complications Negative for: history of anesthetic complications  Airway Mallampati: II  TM Distance: >3 FB Neck ROM: Full    Dental  (+) Teeth Intact, Dental Advisory Given   Pulmonary former smoker,    Pulmonary exam normal breath sounds clear to auscultation       Cardiovascular hypertension, Pt. on medications Normal cardiovascular exam Rhythm:Regular Rate:Normal     Neuro/Psych negative neurological ROS     GI/Hepatic negative GI ROS, Neg liver ROS,   Endo/Other  negative endocrine ROS  Renal/GU negative Renal ROS     Musculoskeletal  (+) Arthritis , Osteoarthritis,    Abdominal   Peds  Hematology  (+) Blood dyscrasia (polycythemia vera, thrombocytosis: stable for surgery as per hematologist Dr. Benay Spice), ,   Anesthesia Other Findings   Reproductive/Obstetrics                             Anesthesia Physical  Anesthesia Plan  ASA: III  Anesthesia Plan: General   Post-op Pain Management:    Induction: Intravenous  PONV Risk Score and Plan: 3 and Treatment may vary due to age or medical condition, Ondansetron and Dexamethasone  Airway Management Planned: Oral ETT  Additional Equipment:   Intra-op Plan:   Post-operative Plan: Extubation in OR  Informed Consent: I have reviewed the patients History and Physical, chart, labs and discussed the procedure including the risks, benefits and alternatives for the proposed anesthesia with the patient or authorized representative who has indicated his/her understanding and acceptance.   Dental advisory given  Plan Discussed with: CRNA, Surgeon and Anesthesiologist  Anesthesia Plan Comments: (Plan routine monitors,  GETA)        Anesthesia Quick Evaluation

## 2018-02-04 NOTE — Discharge Instructions (Signed)

## 2018-02-04 NOTE — Anesthesia Postprocedure Evaluation (Signed)
Anesthesia Post Note  Patient: Alejandra [name]  Procedure(s) Performed: RIGHT TOTAL HIP REVISION: REVISION OF RIGHT MONOPOLAR/CONVERSION TO TOTAL HIP (Right Hip)     Patient location during evaluation: PACU Anesthesia Type: General Level of consciousness: awake and alert Pain management: pain level controlled Vital Signs Assessment: post-procedure vital signs reviewed and stable Respiratory status: spontaneous breathing, nonlabored ventilation, respiratory function stable and patient connected to nasal cannula oxygen Cardiovascular status: blood pressure returned to baseline and stable Postop Assessment: no apparent nausea or vomiting Anesthetic complications: no    Last Vitals:  Vitals:   02/04/18 1800 02/04/18 1818  BP: 124/77 106/62  Pulse: 66 69  Resp: 15 14  Temp: (!) 36.3 C 36.6 C  SpO2: 100% 96%    Last Pain:  Vitals:   02/04/18 1818  TempSrc: Oral  PainSc:                  Klever Twyford P Prestyn Stanco

## 2018-02-04 NOTE — Anesthesia Procedure Notes (Signed)
Procedure Name: Intubation Date/Time: 02/04/2018 2:31 PM Performed by: West Pugh, CRNA Pre-anesthesia Checklist: Patient identified, Emergency Drugs available, Suction available, Patient being monitored and Timeout performed Patient Re-evaluated:Patient Re-evaluated prior to induction Oxygen Delivery Method: Circle system utilized Preoxygenation: Pre-oxygenation with 100% oxygen Induction Type: IV induction Ventilation: Mask ventilation without difficulty Laryngoscope Size: Mac and 3 Grade View: Grade II Tube type: Oral Tube size: 7.0 mm Number of attempts: 1 Airway Equipment and Method: Stylet Placement Confirmation: ETT inserted through vocal cords under direct vision,  positive ETCO2,  CO2 detector and breath sounds checked- equal and bilateral Secured at: 22 cm Tube secured with: Tape Dental Injury: Teeth and Oropharynx as per pre-operative assessment

## 2018-02-05 ENCOUNTER — Encounter (HOSPITAL_COMMUNITY): Payer: Self-pay | Admitting: Orthopedic Surgery

## 2018-02-05 LAB — BASIC METABOLIC PANEL
ANION GAP: 3 — AB (ref 5–15)
BUN: 20 mg/dL (ref 8–23)
CALCIUM: 8 mg/dL — AB (ref 8.9–10.3)
CO2: 25 mmol/L (ref 22–32)
Chloride: 111 mmol/L (ref 98–111)
Creatinine, Ser: 0.92 mg/dL (ref 0.44–1.00)
GFR calc Af Amer: >60 mL/min (ref >60)
GFR, EST NON AFRICAN AMERICAN: 58 mL/min — AB (ref >60)
Glucose, Bld: 134 mg/dL — ABNORMAL HIGH (ref 70–99)
POTASSIUM: 5.2 mmol/L — AB (ref 3.5–5.1)
SODIUM: 139 mmol/L (ref 135–145)

## 2018-02-05 LAB — CBC
HCT: 28.4 % — ABNORMAL LOW (ref 36.0–46.0)
Hemoglobin: 8 g/dL — ABNORMAL LOW (ref 12.0–15.0)
MCH: 19.2 pg — ABNORMAL LOW (ref 26.0–34.0)
MCHC: 28.2 g/dL — ABNORMAL LOW (ref 30.0–36.0)
MCV: 68.3 fL — ABNORMAL LOW (ref 78.0–100.0)
PLATELETS: 1224 10*3/uL — AB (ref 150–400)
RBC: 4.16 MIL/uL (ref 3.87–5.11)
RDW: 25.2 % — ABNORMAL HIGH (ref 11.5–15.5)
WBC: 45 10*3/uL — AB (ref 4.0–10.5)

## 2018-02-05 LAB — PREPARE RBC (CROSSMATCH)

## 2018-02-05 MED ORDER — SODIUM CHLORIDE 0.9% IV SOLUTION
Freq: Once | INTRAVENOUS | Status: AC
  Administered 2018-02-05: 15:00:00 via INTRAVENOUS

## 2018-02-05 MED ORDER — DEXTROSE-NACL 5-0.45 % IV SOLN
INTRAVENOUS | Status: DC
  Administered 2018-02-05 – 2018-02-07 (×5): via INTRAVENOUS

## 2018-02-05 NOTE — Progress Notes (Signed)
Physical Therapy Treatment Patient Details Name: Alejandra [name] MRN: [id number] DOB: 06-Aug-1941 Today's Date: 02/05/2018    History of Present Illness RIGHT TOTAL HIP REVISION: REVISION OF RIGHT MONOPOLAR/CONVERSION TO TOTAL HIP (Right, h/o polycythemia vera, thrombocytosis, htn, bipolar hemiarthroplasy 2013, vertigo    PT Comments    POD # 1 pm session. Assisted OOB to [facility] required + 2 assist.  General transfer comment: + 2 side by side assist and 100% VC's on proper hand placement, R LE advancement and Total Assist to complete 1/4 pivot from bed to [facility] then back to bed.  Pt was unable to amb so assisted back to bed.     Follow Up Recommendations  SNF     Equipment Recommendations  None recommended by PT    Recommendations for Other Services       Precautions / Restrictions Precautions Precautions: Fall;Posterior Hip Precaution Comments: pt recalled 0/3 THP so re educated and demonstarted Restrictions Weight Bearing Restrictions: No Other Position/Activity Restrictions: WBAT    Mobility  Bed Mobility Overal bed mobility: Needs Assistance Bed Mobility: Supine to Sit;Sit to Supine     Supine to sit: Max assist;+2 for physical assistance;+2 for safety/equipment Sit to supine: Total assist;+2 for physical assistance;+2 for safety/equipment   General bed mobility comments: assist with both legs and trunk to return to supine plus use of bed pad  Transfers Overall transfer level: Needs assistance Equipment used: Rolling walker (2 wheeled) Transfers: Sit to/from [facility] Sit to Stand: Total assist;+2 physical assistance;+2 safety/equipment Stand pivot transfers: Total assist;+2 physical assistance       General transfer comment: + 2 side by side assist and 100% VC's on proper hand placement, R LE advancement and Total Assist to complete 1/4 pivot from bed to [facility] then back to bed  Ambulation/Gait             General Gait Details: unable to  attempt due to cont involuntary spastic mvts throughout during activity.  At rest, pt is still.     Stairs             Wheelchair Mobility    Modified Rankin (Stroke Patients Only)       Balance Overall balance assessment: Needs assistance Sitting-balance support: Feet supported;Bilateral upper extremity supported Sitting balance-Leahy Scale: Poor Sitting balance - Comments: trunk jerking                                    Cognition Arousal/Alertness: Awake/alert Behavior During Therapy: WFL for tasks assessed/performed Overall Cognitive Status: Within Functional Limits for tasks assessed                                 General Comments: pleasant      Exercises      General Comments        Pertinent Vitals/Pain Pain Assessment: 0-10 Pain Score: 5  Faces Pain Scale: Hurts little more Pain Location: right  hip with activity Pain Descriptors / Indicators: Discomfort;Guarding;Sore Pain Intervention(s): Monitored during session;Repositioned;Ice applied    Home Living                      Prior Function            PT Goals (current goals can now be found in the care plan section) Progress towards PT goals: Progressing  toward goals    Frequency    Min 6X/week      PT Plan Current plan remains appropriate    Co-evaluation              AM-PAC PT "6 Clicks" Daily Activity  Outcome Measure  Difficulty turning over in bed (including adjusting bedclothes, sheets and blankets)?: A Lot Difficulty moving from lying on back to sitting on the side of the bed? : A Lot Difficulty sitting down on and standing up from a chair with arms (e.g., wheelchair, bedside commode, etc,.)?: A Lot Help needed moving to and from a bed to chair (including a wheelchair)?: A Lot Help needed walking in hospital room?: A Lot Help needed climbing 3-5 steps with a railing? : Total 6 Click Score: 11    End of Session Equipment  Utilized During Treatment: Gait belt Activity Tolerance: Other (comment)(spastic involuntary mvts hinder activity) Patient left: in bed;with call bell/phone within reach;with bed alarm set Nurse Communication: Mobility status PT Visit Diagnosis: Unsteadiness on feet (R26.81);Muscle weakness (generalized) (M62.81);Dizziness and giddiness (R42)     Time: 1308-6578 PT Time Calculation (min) (ACUTE ONLY): 50 min  Charges:  $Therapeutic Activity: 38-52 mins                    G Codes:       Rica Koyanagi  PTA [facility]  Acute  Rehab Pager      [phone]

## 2018-02-05 NOTE — Evaluation (Signed)
60Physical Therapy Evaluation Patient Details Name: Alejandra [name] MRN: [id number] DOB: 10-08-1940 Today's Date: 02/05/2018   History of Present Illness  RIGHT TOTAL HIP REVISION: REVISION OF RIGHT MONOPOLAR/CONVERSION TO TOTAL HIP (Right, h/o polycythemia vera, thrombocytosis, htn, bipolar hemiarthroplasy 2013, vertigo, received 1 unit of blood post op.  Clinical Impression  The patient is noted with food and drink spilled on bed and self. Patient is noted  To have difficulty  Feeding self due to jerking of arms. When assisted to sitting, jerking noted in trunk and has no balance control, also noted jerking of the legs  With inability to bear weight to transfer. Patient was  Picked up with " bear hug' approach  and placed onto [facility] then to recliner with 2 total assist. RN notified of the jerking movements. Pt admitted with above diagnosis. Pt currently with functional limitations due to the deficits listed below (see PT Problem List). Pt will benefit from skilled PT to increase their independence and safety with mobility to allow discharge to the venue listed below.   Patient also complained of dizziness and reports having history of vertigo. The patient  Did not report spinning , just dizzy. BP 100/60.    Follow Up Recommendations SNF    Equipment Recommendations  None recommended by PT    Recommendations for Other Services       Precautions / Restrictions Precautions Precautions: Fall;Posterior Hip Precaution Comments: patient having jerking movements of trunk/ legs and arms      Mobility  Bed Mobility Overal bed mobility: Needs Assistance Bed Mobility: Supine to Sit;Sit to Supine     Supine to sit: Max assist;+2 for physical assistance;+2 for safety/equipment Sit to supine: Max assist;+2 for physical assistance;+2 for safety/equipment   General bed mobility comments: patient required assist with both legs and trunk, bed pad used to slide patient to bed edge, assist with trunk to  sitting upright, noted jerking of the trunk and poor  Trunk control. Transfers Overall transfer level: Needs assistance Equipment used: Rolling walker (2 wheeled) Transfers: Sit to/from [facility] Transfers;Stand Pivot Transfers Sit to Stand: Total assist;+2 physical assistance;+2 safety/equipment Stand pivot transfers: Total assist;+2 physical assistance Squat pivot transfers: Total assist;+2 physical assistance;+2 safety/equipment     General transfer comment: attempted standfing at RW x 2 with 2 assist Patient's body jerking, unable to maintain weight on legs to stand. Total  squat piovot with "bear hug" with , patient not bearing weight. Transferred to to [facility] then to recliner with 2 total assist.  Ambulation/Gait                Stairs            Wheelchair Mobility    Modified Rankin (Stroke Patients Only)       Balance Overall balance assessment: Needs assistance Sitting-balance support: Feet supported;Bilateral upper extremity supported Sitting balance-Leahy Scale: Poor Sitting balance - Comments: trunk jerking                                     Pertinent Vitals/Pain Pain Assessment: Faces Faces Pain Scale: Hurts little more Pain Location: right  hip Pain Descriptors / Indicators: Discomfort;Guarding Pain Intervention(s): Repositioned;Premedicated before session;Monitored during session;Limited activity within patient's tolerance    Home Living Family/patient expects to be discharged to:: Skilled nursing facility Living Arrangements: Alone Available Help at Discharge: [place]  Prior Function Level of Independence: Independent with assistive device(s)               Hand Dominance        Extremity/Trunk Assessment   Upper Extremity Assessment Upper Extremity Assessment: RUE deficits/detail;LUE deficits/detail RUE Deficits / Details: patient having jerking movements of the arms,  unable to maintain a grip on RW LUE Deficits / Details: same as right    Lower Extremity Assessment Lower Extremity Assessment: RLE deficits/detail;LLE deficits/detail RLE Deficits / Details: jerking and knees buckling, unable to maintain weight  LLE Deficits / Details: jerking and knees buckling    Cervical / Trunk Assessment Cervical / Trunk Assessment: Other exceptions Cervical / Trunk Exceptions: trunk is jerking, dies not stay at midline, holds tio armrests  Communication   Communication: No difficulties  Cognition Arousal/Alertness: Awake/alert Behavior During Therapy: WFL for tasks assessed/performed Overall Cognitive Status: Within Functional Limits for tasks assessed                                        General Comments      Exercises     Assessment/Plan    PT Assessment Patient needs continued PT services  PT Problem List Decreased strength;Decreased knowledge of use of DME;Decreased range of motion;Decreased activity tolerance;Decreased safety awareness;Decreased balance;Decreased knowledge of precautions;Decreased mobility       PT Treatment Interventions DME instruction;Gait training;Functional mobility training;Therapeutic activities;Therapeutic exercise;Patient/family education    PT Goals (Current goals can be found in the Care Plan section)  Acute Rehab PT Goals Patient Stated Goal: to go to rehab, walk PT Goal Formulation: With patient Time For Goal Achievement: 02/12/18 Potential to Achieve Goals: Good    Frequency Min 6X/week   Barriers to discharge Decreased caregiver support      Co-evaluation               AM-PAC PT "6 Clicks" Daily Activity  Outcome Measure Difficulty turning over in bed (including adjusting bedclothes, sheets and blankets)?: Unable Difficulty moving from lying on back to sitting on the side of the bed? : Unable Difficulty sitting down on and standing up from a chair with arms (e.g., wheelchair,  bedside commode, etc,.)?: Unable Help needed moving to and from a bed to chair (including a wheelchair)?: Total Help needed walking in hospital room?: Total Help needed climbing 3-5 steps with a railing? : Total 6 Click Score: 6    End of Session Equipment Utilized During Treatment: Gait belt Activity Tolerance: Other (comment);Patient tolerated treatment well(unable to control jerking oog limbs and trunk) Patient left: in chair;with call bell/phone within reach;with nursing/sitter in room Nurse Communication: Mobility status PT Visit Diagnosis: Unsteadiness on feet (R26.81);Muscle weakness (generalized) (M62.81);Dizziness and giddiness (R42)    Time: 1791-5056 PT Time Calculation (min) (ACUTE ONLY): 24 min   Charges:   PT Evaluation $PT Eval Moderate Complexity: 1 Mod PT Treatments $Therapeutic Activity: 8-22 mins   PT G CodesTresa Endo PT [phone]   Claretha Cooper 02/05/2018, 12:07 PM

## 2018-02-05 NOTE — Progress Notes (Signed)
Informed Dr. Mayer Camel of platelets 1.224. No new orders. VS stable. Foley removed.

## 2018-02-05 NOTE — Progress Notes (Signed)
RN noticed pt had some jerky movements in arms/hands, hindering ability to feed herself, take medications, and work with PT.  PT also noticed this during PT session.  Pt also slightly forgetful.  PA Joanell Rising made aware, states he will reach out to Dr. Mayer Camel. Vitals stable, family at the bedside. RN will monitor.

## 2018-02-05 NOTE — Progress Notes (Signed)
[name]  MRN: [id number]  DOB/AGE:  1941-01-14 / 77 y.o.  1 Day Post-Op Procedure(s) (LRB): RIGHT TOTAL HIP REVISION: REVISION OF RIGHT MONOPOLAR/CONVERSION TO TOTAL HIP (Right)    PROGRESS NOTE Subjective: Patient is alert, oriented, No Nausea, No Vomiting, yes passing gas, . Taking PO Well. Denies SOB, Chest or Calf Pain. Using Incentive Spirometer, PAS in place. Ambulate Up to bathroom, she is weightbearing as tolerated Patient reports pain as  3/10  .    Objective: Vital signs in last 24 hours: Vitals:   02/05/18 0113 02/05/18 0149 02/05/18 0400 02/05/18 0601  BP: 93/60 107/61 101/60 104/64  Pulse: 70 73 74 72  Resp:  16 16 15   Temp: 98.6 F (37 C) 97.7 F (36.5 C) 98.9 F (37.2 C) 98.7 F (37.1 C)  TempSrc: Oral Oral Oral Oral  SpO2: 100% 100% 100% 100%  Weight:      Height:          Intake/Output from previous day: I/O last 3 completed shifts: In: 5452.9 [P.O.:180; I.V.:4247.9; Blood:315; IV Piggyback:710] Out: 1800 [Urine:700; Blood:1100]   Intake/Output this shift: No intake/output data recorded.   LABORATORY DATA: Recent Labs    02/04/18 1949 02/05/18 0542  WBC  --  45.0*  HGB 8.1* 8.0*  HCT 30.6* 28.4*  PLT  --  1,224*  NA  --  139  K  --  5.2*  CL  --  111  CO2  --  25  BUN  --  20  CREATININE  --  0.92  GLUCOSE  --  134*  CALCIUM  --  8.0*    Examination: Neurologically intact ABD soft Neurovascular intact Sensation intact distally Intact pulses distally Dorsiflexion/Plantar flexion intact Incision: moderate drainage No cellulitis present Compartment soft} XR AP&Lat of hip shows well placed\fixed THA  Assessment:   1 Day Post-Op Procedure(s) (LRB): RIGHT TOTAL HIP REVISION: REVISION OF RIGHT MONOPOLAR/CONVERSION TO TOTAL HIP (Right) ADDITIONAL DIAGNOSIS:  Expected Acute Blood Loss Anemia, Hypertension, Triphasic polycythemia vera  Plan: PT/OT WBAT, THA  DVT Prophylaxis: SCDx72 hrs, ASA 325 mg BID x 2 weeks, Change  dressing today  DISCHARGE PLAN: Home, Probably tomorrow  DISCHARGE NEEDS: HHPT, Walker and 3-in-1 comode seat

## 2018-02-05 NOTE — Progress Notes (Signed)
Physical Therapy Treatment Patient Details Name: Alejandra [name] MRN: [id number] DOB: 1941/08/12 Today's Date: 02/05/2018    History of Present Illness RIGHT TOTAL HIP REVISION: REVISION OF RIGHT MONOPOLAR/CONVERSION TO TOTAL HIP (Right, h/o polycythemia vera, thrombocytosis, htn, bipolar hemiarthroplasy 2013, vertigo    PT Comments     Body jerks continue and  Requiring 2 total assist for transfers. Continue PT.    Follow Up Recommendations  SNF     Equipment Recommendations  None recommended by PT    Recommendations for Other Services       Precautions / Restrictions Precautions Precautions: Fall;Posterior Hip Precaution Comments: patient having jerking movements of trunk/ legs and arms    Mobility  Bed Mobility Overal bed mobility: Needs Assistance Bed Mobility: Sit to Supine     Supine to sit: Max assist;+2 for physical assistance;+2 for safety/equipment Sit to supine: Total assist;+2 for physical assistance;+2 for safety/equipment   General bed mobility comments: assist with both legs and trunk to return to supine  Transfers Overall transfer level: Needs assistance Equipment used: Rolling walker (2 wheeled) Transfers: Sit to/from Stand;Squat Pivot Transfers;Stand Pivot Transfers Sit to Stand: Total assist;+2 physical assistance;+2 safety/equipment Stand pivot transfers: Total assist;+2 physical assistance Squat pivot transfers: Total assist;+2 physical assistance;+2 safety/equipment     General transfer comment:  patient's body jerking, unable to maintain weight on legs to stand. Total  squat piovot with "bear hug" to transfer to [facility] then to bed with +2 total. Trunk and extrmities jerking so  badly.   Ambulation/Gait                 Stairs             Wheelchair Mobility    Modified Rankin (Stroke Patients Only)       Balance Overall balance assessment: Needs assistance Sitting-balance support: Feet supported;Bilateral upper extremity  supported Sitting balance-Leahy Scale: Poor Sitting balance - Comments: trunk jerking                                    Cognition Arousal/Alertness: Awake/alert Behavior During Therapy: WFL for tasks assessed/performed Overall Cognitive Status: Within Functional Limits for tasks assessed                                        Exercises      General Comments        Pertinent Vitals/Pain Pain Assessment: Faces Faces Pain Scale: Hurts little more Pain Location: right  hip Pain Descriptors / Indicators: Discomfort;Guarding Pain Intervention(s): Monitored during session;Premedicated before session    Home Living Family/patient expects to be discharged to:: Skilled nursing facility Living Arrangements: Alone Available Help at Discharge: [place]                Prior Function Level of Independence: Independent with assistive device(s)          PT Goals (current goals can now be found in the care plan section) Acute Rehab PT Goals Patient Stated Goal: to go to rehab, walk PT Goal Formulation: With patient Time For Goal Achievement: 02/12/18 Potential to Achieve Goals: Good Progress towards PT goals: Progressing toward goals    Frequency    Min 6X/week      PT Plan      Co-evaluation  AM-PAC PT "6 Clicks" Daily Activity  Outcome Measure  Difficulty turning over in bed (including adjusting bedclothes, sheets and blankets)?: Unable Difficulty moving from lying on back to sitting on the side of the bed? : Unable Difficulty sitting down on and standing up from a chair with arms (e.g., wheelchair, bedside commode, etc,.)?: Unable Help needed moving to and from a bed to chair (including a wheelchair)?: Total Help needed walking in hospital room?: Total Help needed climbing 3-5 steps with a railing? : Total 6 Click Score: 6    End of Session Equipment Utilized During Treatment: Gait  belt Activity Tolerance: Other (comment);Patient tolerated treatment well Patient left: in bed;with call bell/phone within reach;with bed alarm set Nurse Communication: Mobility status PT Visit Diagnosis: Unsteadiness on feet (R26.81);Muscle weakness (generalized) (M62.81);Dizziness and giddiness (R42)     Time: 1962-2297 PT Time Calculation (min) (ACUTE ONLY): 10 min  Charges:  $Therapeutic Activity: 8-22 mins                    G CodesTresa Endo PT [phone]    Claretha Cooper 02/05/2018, 2:03 PM

## 2018-02-06 LAB — HEMOGLOBIN AND HEMATOCRIT, BLOOD
HCT: 26.4 % — ABNORMAL LOW (ref 36.0–46.0)
HEMOGLOBIN: 8 g/dL — AB (ref 12.0–15.0)

## 2018-02-06 LAB — CBC
HCT: 26.2 % — ABNORMAL LOW (ref 36.0–46.0)
Hemoglobin: 7.8 g/dL — ABNORMAL LOW (ref 12.0–15.0)
MCH: 21.3 pg — AB (ref 26.0–34.0)
MCHC: 29.8 g/dL — AB (ref 30.0–36.0)
MCV: 71.4 fL — AB (ref 78.0–100.0)
Platelets: 1376 10*3/uL (ref 150–400)
RBC: 3.67 MIL/uL — AB (ref 3.87–5.11)
RDW: 26.9 % — AB (ref 11.5–15.5)
WBC: 45 10*3/uL — ABNORMAL HIGH (ref 4.0–10.5)

## 2018-02-06 LAB — BASIC METABOLIC PANEL
Anion gap: 4 — ABNORMAL LOW (ref 5–15)
BUN: 22 mg/dL (ref 8–23)
CO2: 24 mmol/L (ref 22–32)
CREATININE: 0.95 mg/dL (ref 0.44–1.00)
Calcium: 8.1 mg/dL — ABNORMAL LOW (ref 8.9–10.3)
Chloride: 108 mmol/L (ref 98–111)
GFR calc Af Amer: >60 mL/min (ref >60)
GFR calc non Af Amer: 56 mL/min — ABNORMAL LOW (ref >60)
GLUCOSE: 140 mg/dL — AB (ref 70–99)
POTASSIUM: 4.7 mmol/L (ref 3.5–5.1)
SODIUM: 136 mmol/L (ref 135–145)

## 2018-02-06 LAB — PREPARE RBC (CROSSMATCH)

## 2018-02-06 MED ORDER — SODIUM CHLORIDE 0.9% IV SOLUTION
Freq: Once | INTRAVENOUS | Status: AC
  Administered 2018-02-06: 15:00:00 via INTRAVENOUS

## 2018-02-06 NOTE — Progress Notes (Addendum)
[name]  MRN: [id number]  DOB/AGE:  02-15-1941 / 77 y.o.  2 Days Post-Op Procedure(s) (LRB): RIGHT TOTAL HIP REVISION: REVISION OF RIGHT MONOPOLAR/CONVERSION TO TOTAL HIP (Right)    PROGRESS NOTE Subjective: Patient is alert, oriented, no Nausea, no Vomiting, yes passing gas, . Taking PO with small bites. Denies SOB, Chest or Calf Pain. Using Incentive Spirometer, PAS in place. Ambulate WBAT with pt working on transfers with therapy Patient reports pain as  5/10  .    Objective: Vital signs in last 24 hours: Vitals:   02/05/18 1734 02/05/18 1749 02/05/18 2127 02/06/18 0610  BP: (!) 99/57 115/63 120/70 138/78  Pulse: 75 80 85 87  Resp: 16 16 16 16   Temp: 98.8 F (37.1 C) 99.1 F (37.3 C) 98.8 F (37.1 C) 99.1 F (37.3 C)  TempSrc: Oral Oral Oral Oral  SpO2: 99% 99% 99% 100%  Weight:      Height:          Intake/Output from previous day: I/O last 3 completed shifts: In: 4036.3 [P.O.:1300; I.V.:2106.3; Blood:630] Out: 950 [Urine:950]   Intake/Output this shift: No intake/output data recorded.   LABORATORY DATA: Recent Labs    02/05/18 0542 02/06/18 0602  WBC 45.0* 45.0*  HGB 8.0* 7.8*  HCT 28.4* 26.2*  PLT 1,224* 1,376*  NA 139 136  K 5.2* 4.7  CL 111 108  CO2 25 24  BUN 20 22  CREATININE 0.92 0.95  GLUCOSE 134* 140*  CALCIUM 8.0* 8.1*    Examination: Neurologically intact Neurovascular intact Sensation intact distally Intact pulses distally Dorsiflexion/Plantar flexion intact Incision: moderate drainage No cellulitis present Compartment soft} XR AP&Lat of hip shows well placed\fixed THA  Assessment:   2 Days Post-Op Procedure(s) (LRB): RIGHT TOTAL HIP REVISION: REVISION OF RIGHT MONOPOLAR/CONVERSION TO TOTAL HIP (Right) ADDITIONAL DIAGNOSIS:  Expected Acute Blood Loss Anemia, Acute Blood Loss Anemia and Hypertension, Triphasic polycythemia vera  Plan: PT/OT WBAT, THA  DVT Prophylaxis: SCDx72 hrs, ASA 81 mg BID x 2 weeks then  once daily x 2 weeks  DISCHARGE PLAN: Skilled Nursing Facility/Rehab  DISCHARGE NEEDS: HHPT, Walker and 3-in-1 comode seat

## 2018-02-06 NOTE — Progress Notes (Addendum)
PT Cancellation Note  Patient Details Name: Alejandra [name] MRN: [id number] DOB: [birth year]   Cancelled Treatment:  Patient not seen due to medical WBC 45; HGB 7.8. Will attempt to have pt seen  tomorrow.   D/C plans are for SNF   Rachel Bo, SPTA 02/06/2018, 4:00 PM  Rica Koyanagi  PTA [facility]  Acute  Rehab Pager      [phone]

## 2018-02-06 NOTE — Plan of Care (Signed)
  Problem: Elimination: Goal: Will not experience complications related to bowel motility Outcome: Progressing   Problem: Nutrition: Goal: Adequate nutrition will be maintained Outcome: Progressing   Problem: Pain Managment: Goal: General experience of comfort will improve Outcome: Progressing   Problem: Safety: Goal: Ability to remain free from injury will improve Outcome: Progressing

## 2018-02-06 NOTE — NC FL2 (Signed)
[place] LEVEL OF CARE SCREENING TOOL     IDENTIFICATION  Patient Name: Alejandra [name] Birthdate: 1940/09/08 Sex: female Admission Date (Current Location): 02/04/2018  [facility] and [place] Number:  [employer] and Address:  [facility],  [place] [address] [place]      Provider Number: [id number]  Attending Physician Name and Address:  Frederik Pear, MD  Relative Name and Phone Number:       Current Level of Care: Hospital Recommended Level of Care: [place] Prior Approval Number:    Date Approved/Denied:   PASRR Number:  [id number]   Discharge Plan: SNF    Current Diagnoses: Patient Active Problem List   Diagnosis Date Noted  . Primary osteoarthritis of right hip 02/04/2018  . Failed total hip arthroplasty ([place]) 02/01/2018  . Pre-operative cardiovascular examination 01/31/2018  . Abnormal EKG 01/31/2018  . Vertigo 09/28/2014  . Essential hypertension 09/28/2014  . Thrombocytosis ([place]) 04/23/2012  . Polycythemia vera ([place]) 12/22/2011    Orientation RESPIRATION BLADDER Height & Weight     Self, Time, Situation, Place  Normal Continent Weight: 116 lb (52.6 kg) Height:  5\' 4"  (162.6 cm)  BEHAVIORAL SYMPTOMS/MOOD NEUROLOGICAL BOWEL NUTRITION STATUS      Continent Diet(Low sodium heart healthy )  AMBULATORY STATUS COMMUNICATION OF NEEDS Skin   Extensive Assist Verbally Surgical wounds(Right Hip)                       Personal Care Assistance Level of Assistance  Bathing, Feeding, Dressing Bathing Assistance: Limited assistance Feeding assistance: Independent Dressing Assistance: Limited assistance     Functional Limitations Info  Sight, Hearing, Speech Sight Info: Adequate Hearing Info: Adequate Speech Info: Adequate    SPECIAL CARE FACTORS FREQUENCY  PT (By licensed PT), OT (By licensed OT)     PT Frequency: 5x/week  OT Frequency: 5x/week            Contractures Contractures Info:  Not present    Additional Factors Info  Code Status, Allergies Code Status Info: Fullcode  Allergies Info: Allergies: No Known Allergies           Current Medications (02/06/2018):  This is the current hospital active medication list Current Facility-Administered Medications  Medication Dose Route Frequency Provider Last Rate Last Dose  . 0.9 %  sodium chloride infusion (Manually program via Guardrails IV Fluids)   Intravenous Once Leighton Parody, PA-C      . acetaminophen (TYLENOL) tablet 325-650 mg  325-650 mg Oral Q6H PRN Leighton Parody, PA-C   650 mg at 02/05/18 2115  . ALPRAZolam Duanne Moron) tablet 0.25 mg  0.25 mg Oral BID PRN Leighton Parody, PA-C      . alum & mag hydroxide-simeth (MAALOX/MYLANTA) 200-200-20 MG/5ML suspension 30 mL  30 mL Oral Q4H PRN Leighton Parody, PA-C      . amLODipine (NORVASC) tablet 10 mg  10 mg Oral Daily Leighton Parody, PA-C   10 mg at 02/06/18 0945  . aspirin chewable tablet 81 mg  81 mg Oral BID Leighton Parody, PA-C   81 mg at 02/06/18 0945  . atorvastatin (LIPITOR) tablet 10 mg  10 mg Oral QHS Leighton Parody, PA-C   10 mg at 02/05/18 2114  . benazepril (LOTENSIN) tablet 40 mg  40 mg Oral Daily Leighton Parody, PA-C   40 mg at 02/06/18 0944  . bisacodyl (DULCOLAX) EC tablet 5 mg  5  mg Oral Daily PRN Leighton Parody, PA-C      . celecoxib (CELEBREX) capsule 200 mg  200 mg Oral BID Leighton Parody, PA-C   200 mg at 02/06/18 0944  . dextrose 5 %-0.45 % sodium chloride infusion   Intravenous Continuous Leighton Parody, PA-C 125 mL/hr at 02/06/18 0455    . diphenhydrAMINE (BENADRYL) 12.5 MG/5ML elixir 12.5-25 mg  12.5-25 mg Oral Q4H PRN Leighton Parody, PA-C      . docusate sodium (COLACE) capsule 100 mg  100 mg Oral BID Leighton Parody, PA-C   100 mg at 02/06/18 0944  . gabapentin (NEURONTIN) capsule 300 mg  300 mg Oral TID Leighton Parody, PA-C   300 mg at 02/06/18 0945  . HYDROmorphone (DILAUDID) injection 0.5 mg  0.5 mg Intravenous Q4H PRN  Joanell Rising K, PA-C      . hydroxyurea (HYDREA) capsule 500 mg  500 mg Oral Q M,W,F Frederik Pear, MD   500 mg at 02/06/18 0945   And  . hydroxyurea (HYDREA) capsule 1,000 mg  1,000 mg Oral Once per day on Sun Tue Thu Sat Frederik Pear, MD   1,000 mg at 02/05/18 1020  . latanoprost (XALATAN) 0.005 % ophthalmic solution 1 drop  1 drop Both Eyes QHS Leighton Parody, PA-C   1 drop at 02/05/18 2116  . loratadine (CLARITIN) tablet 10 mg  10 mg Oral Daily PRN Leighton Parody, PA-C      . meclizine (ANTIVERT) tablet 25 mg  25 mg Oral TID PRN Leighton Parody, PA-C      . menthol-cetylpyridinium (CEPACOL) lozenge 3 mg  1 lozenge Oral PRN Leighton Parody, PA-C       Or  . phenol (CHLORASEPTIC) mouth spray 1 spray  1 spray Mouth/Throat PRN Leighton Parody, PA-C      . methocarbamol (ROBAXIN) tablet 500 mg  500 mg Oral Q6H PRN Leighton Parody, PA-C   500 mg at 02/06/18 0645   Or  . methocarbamol (ROBAXIN) 500 mg in dextrose 5 % 50 mL IVPB  500 mg Intravenous Q6H PRN Leighton Parody, PA-C      . metoCLOPramide (REGLAN) tablet 5-10 mg  5-10 mg Oral Q8H PRN Leighton Parody, PA-C       Or  . metoCLOPramide (REGLAN) injection 5-10 mg  5-10 mg Intravenous Q8H PRN Leighton Parody, PA-C      . ondansetron [facility]) tablet 4 mg  4 mg Oral Q6H PRN Leighton Parody, PA-C       Or  . ondansetron [facility]) injection 4 mg  4 mg Intravenous Q6H PRN Leighton Parody, PA-C      . oxyCODONE (Oxy IR/ROXICODONE) immediate release tablet 5-10 mg  5-10 mg Oral Q4H PRN Joanell Rising K, PA-C   5 mg at 02/06/18 0450  . pantoprazole (PROTONIX) EC tablet 40 mg  40 mg Oral Daily Leighton Parody, PA-C   40 mg at 02/06/18 0945  . polyethylene glycol (MIRALAX / GLYCOLAX) packet 17 g  17 g Oral Daily PRN Leighton Parody, PA-C      . sodium phosphate (FLEET) 7-19 GM/118ML enema 1 enema  1 enema Rectal Once PRN Leighton Parody, PA-C         Discharge Medications: Please see discharge summary for a list of discharge  medications.  Relevant Imaging Results:  Relevant Lab Results:   Additional Information ssn:[id number]  Lia Hopping, LCSW

## 2018-02-06 NOTE — Progress Notes (Signed)
CSW provided patient with list of bed offers, patient chose [facility] SNF due to familiarity with the facility and the closeness to her home.  The facility initated insurance auth. CSW waiting for a response.   Kathrin Greathouse, Marlinda Mike, MSW Clinical Social Worker  [phone] 02/06/2018  4:28 PM

## 2018-02-06 NOTE — Clinical Social Work Note (Signed)
Clinical Social Work Assessment  Patient Details  Name: Alejandra [name] MRN: [id number] Date of Birth: 06/18/1941  Date of referral:  02/06/18               Reason for consult:  Facility Placement                Permission sought to share information with:  Family Supports Permission granted to share information::  Yes, Verbal Permission Granted  Name::        Agency::  SNF  Relationship::  Sister-Rosa [place] [phone]/ [place] [phone]  Contact Information:     Housing/Transportation Living arrangements for the past 2 months:  Single Family Home Source of Information:  Patient Patient Interpreter Needed:  None Criminal Activity/Legal Involvement Pertinent to Current Situation/Hospitalization:  No - Comment as needed Significant Relationships:  Siblings Lives with:  Self Do you feel safe going back to the place where you live?  Yes Need for family participation in patient care:  Yes (Comment)  Care giving concerns:   Patient is admitted for right revision total hip arthroplasty. SNF placement for short rehab  Social Worker assessment / plan:  CSW met with the patient at bedside, explain role and reason for visit- to assist with discharge to SNF. Patient reports she live alone and is slowly progressing with physical therapy. Patient report prior to surgery she used a walker to ambulate. She has a home nurse aide 3 times a week that  assist her with bathing. Patient reports her siblings assist with the cleaning, and buying groceries. Patient reports she can still prepare her own meals.   Patient reports she went to SNF about 10 years ago for rehab and had and "ok experience". CSW explain SNF process and the Insurance authorization for process for  [facility] time frame can take up 48-72 hours for review. Patient reports understanding.  -CSW will follow up with bed offers and the SNF will then initiate the authorization.   Plan: SNF   Employment status:   Retired Nurse, adult PT Recommendations:  [place] / Referral to community resources:  [place]  Patient/Family's Response to care:  Agreeable and Responding well to care.   Patient/Family's Understanding of and Emotional Response to Diagnosis, Current Treatment, and Prognosis:  Patient has a basic understanding of her procedure and follow up care "I cannot walk without having help, I cannot go home alone. " Patient reports her sister is next of kin and is involved in her care.   Emotional Assessment Appearance:  Appears stated age Attitude/Demeanor/Rapport:    Affect (typically observed):  Accepting, Calm Orientation:  Oriented to Self, Oriented to Place, Oriented to  Time, Oriented to Situation Alcohol / Substance use:  Not Applicable Psych involvement (Current and /or in the community):  No (Comment)  Discharge Needs  Concerns to be addressed:  Discharge Planning Concerns Readmission within the last 30 days:  No Current discharge risk:  Dependent with Mobility Barriers to Discharge:  Continued Medical Work up, Technical brewer. 48 hours)   Lia Hopping, LCSW 02/06/2018, 10:13 AM

## 2018-02-07 ENCOUNTER — Other Ambulatory Visit: Payer: Self-pay

## 2018-02-07 LAB — CBC
HCT: 24.6 % — ABNORMAL LOW (ref 36.0–46.0)
HEMOGLOBIN: 7.5 g/dL — AB (ref 12.0–15.0)
MCH: 23.3 pg — AB (ref 26.0–34.0)
MCHC: 30.5 g/dL (ref 30.0–36.0)
MCV: 76.4 fL — ABNORMAL LOW (ref 78.0–100.0)
Platelets: 790 10*3/uL — ABNORMAL HIGH (ref 150–400)
RBC: 3.22 MIL/uL — AB (ref 3.87–5.11)
RDW: 27.2 % — ABNORMAL HIGH (ref 11.5–15.5)
WBC: 27.4 10*3/uL — ABNORMAL HIGH (ref 4.0–10.5)

## 2018-02-07 LAB — PREPARE RBC (CROSSMATCH)

## 2018-02-07 MED ORDER — SODIUM CHLORIDE 0.9% IV SOLUTION
Freq: Once | INTRAVENOUS | Status: AC
  Administered 2018-02-07: 10:00:00 via INTRAVENOUS

## 2018-02-07 NOTE — Progress Notes (Signed)
I was called to PT"s room by PT ibuprofen was told that she had a "lot " of bleeding from the dressing on her right hip. She had bledd on her gown and on a lg amt on the bed pad. I place an ABD pad folded in place ove top of 8 4 x 4 gauze and secured with paper tape. She was washed where the blood appeared , bed linins were changed.

## 2018-02-07 NOTE — Progress Notes (Signed)
Physical Therapy Treatment Patient Details Name: Alejandra [name] MRN: [id number] DOB: 1941/02/26 Today's Date: 02/07/2018    History of Present Illness RIGHT TOTAL HIP REVISION: REVISION OF RIGHT MONOPOLAR/CONVERSION TO TOTAL HIP.h/o polycythemia vera, thrombocytosis, htn, bipolar hemiarthroplasy 2013, vertigo    PT Comments    Only noted intermittent jerking movements during session today. However, session was limited by significant bloody drainage from op site. RN + charge nurse in to manage op site. Deferred any further mobility on today. Overall, pt was Mod assist +2 for bed mobility and standing. Will progress activity on tomorrow.    Follow Up Recommendations  Follow surgeon's recommendation for DC plan and follow-up therapies(SNF)     Equipment Recommendations  None recommended by PT    Recommendations for Other Services       Precautions / Restrictions Precautions Precautions: Fall;Posterior Hip Restrictions Weight Bearing Restrictions: No Other Position/Activity Restrictions: WBAT    Mobility  Bed Mobility Overal bed mobility: Needs Assistance Bed Mobility: Supine to Sit;Sit to Supine     Supine to sit: Mod assist;+2 for physical assistance;+2 for safety/equipment;HOB elevated Sit to supine: Mod assist;+2 for physical assistance;+2 for safety/equipment   General bed mobility comments: Supine>sit: assist for R LE and to scoot to EOB. Sit>supine: assist for bil LEs and scoot back onto bed. Increased time.   Transfers Overall transfer level: Needs assistance Equipment used: Rolling walker (2 wheeled) Transfers: Sit to/from Stand Sit to Stand: Mod assist;+2 physical assistance;+2 safety/equipment         General transfer comment: Assist to rise, stabilize, control descent. x 2. Noted significant drainage from op site on 1st standing attempt. Assisted pt back into sitting and notified RN. RN in to assess wound-requested we stand pt again. Wound continued draining  so deferred any further standing and assisted pt back to supine for continued op site management  Ambulation/Gait                 Stairs             Wheelchair Mobility    Modified Rankin (Stroke Patients Only)       Balance Overall balance assessment: Needs assistance   Sitting balance-Leahy Scale: Fair       Standing balance-Leahy Scale: Poor                              Cognition Arousal/Alertness: Awake/alert Behavior During Therapy: WFL for tasks assessed/performed Overall Cognitive Status: Within Functional Limits for tasks assessed                                        Exercises      General Comments        Pertinent Vitals/Pain Pain Assessment: Faces Faces Pain Scale: Hurts even more Pain Location: right  hip with activity Pain Descriptors / Indicators: Discomfort;Guarding;Sore Pain Intervention(s): Limited activity within patient's tolerance;Repositioned    Home Living                      Prior Function            PT Goals (current goals can now be found in the care plan section) Progress towards PT goals: Progressing toward goals    Frequency    7X/week      PT Plan Current plan remains appropriate  Co-evaluation              AM-PAC PT "6 Clicks" Daily Activity  Outcome Measure  Difficulty turning over in bed (including adjusting bedclothes, sheets and blankets)?: Unable Difficulty moving from lying on back to sitting on the side of the bed? : Unable Difficulty sitting down on and standing up from a chair with arms (e.g., wheelchair, bedside commode, etc,.)?: Unable Help needed moving to and from a bed to chair (including a wheelchair)?: A Lot Help needed walking in hospital room?: A Lot Help needed climbing 3-5 steps with a railing? : Total 6 Click Score: 8    End of Session Equipment Utilized During Treatment: Gait belt Activity Tolerance: Other (comment)(Limited by  op site drainage) Patient left: in bed;with call bell/phone within reach;with family/visitor present   PT Visit Diagnosis: Other abnormalities of gait and mobility (R26.89);Muscle weakness (generalized) (M62.81);Difficulty in walking, not elsewhere classified (R26.2)     Time: 5537-4827 PT Time Calculation (min) (ACUTE ONLY): 29 min  Charges:  $Therapeutic Activity: 23-37 mins                    G Codes:          Weston Anna, MPT Pager: [phone]

## 2018-02-07 NOTE — Progress Notes (Signed)
   02/07/18 1600  Clinical Encounter Type  Visited With Patient and family together  Visit Type Follow-up;Psychological support;Spiritual support  Referral From Patient  Consult/Referral To Chaplain  Spiritual Encounters  Spiritual Needs Other (Comment);Literature Market researcher )  Stress Factors  Patient Stress Factors Other (Comment) (Advance Directive )  Family Stress Factors Other (Comment)  Advance Directives (For Healthcare)  Does Patient Have a Medical Advance Directive? No  Would patient like information on creating a medical advance directive? Yes (Inpatient - patient requests chaplain consult to create a medical advance directive) (Paperwork Given )   I followed up with the patient per request from the patient. The patient wants to complete an Advance Directive naming her sister as her healthcare agent.  Spiritual Care will follow up with her tomorrow to complete the document.    Chaplain Shanon Ace M.Div., [facility]

## 2018-02-07 NOTE — Progress Notes (Signed)
   02/07/18 0900  Clinical Encounter Type  Visited With Patient  Visit Type Initial;Psychological support;Spiritual support  Referral From Chaplain  Consult/Referral To Chaplain  Spiritual Encounters  Spiritual Needs Emotional;Other (Comment) (Spiritual Care Conversation/Support)  Stress Factors  Patient Stress Factors None identified   I spoke with the patient per referral from our nighttime Chaplain. The patient did not state any needs at this time.   Please, contact Spiritual Care for further assistance.   Chaplain Shanon Ace M.Div., [facility]

## 2018-02-07 NOTE — Progress Notes (Signed)
[name]  MRN: [id number]  DOB/AGE:  08-22-1940 / 77 y.o.  3 Days Post-Op Procedure(s) (LRB): RIGHT TOTAL HIP REVISION: REVISION OF RIGHT MONOPOLAR/CONVERSION TO TOTAL HIP (Right)    PROGRESS NOTE Subjective: Patient is alert, oriented, no Nausea, no Vomiting, yes passing gas, . Taking PO well. Reports persistent swelling and discomfort in the right thigh.  At surgery she lost 1100 cc of blood and she is had 3 units of packed red cells.  Since surgery. Denies SOB, Chest or Calf Pain. Using Incentive Spirometer, PAS in place. Ambulate Did not get up with therapy secondary to discomfort and transfusion Patient reports pain as  5/10  .    Objective: Vital signs in last 24 hours: Vitals:   02/06/18 1501 02/06/18 1733 02/06/18 2126 02/07/18 0541  BP: 136/67 96/61 127/71 102/61  Pulse: 82 90 85 73  Resp: 16 18 18 18   Temp: 98.7 F (37.1 C) 99.1 F (37.3 C) 99.1 F (37.3 C) 98.4 F (36.9 C)  TempSrc: Oral Oral Oral Oral  SpO2: 100% 99% 100% 100%  Weight:      Height:          Intake/Output from previous day: I/O last 3 completed shifts: In: 4980.5 [P.O.:730; I.V.:3525; Blood:725.5] Out: 1900 [Urine:1900]   Intake/Output this shift: No intake/output data recorded.   LABORATORY DATA: Recent Labs    02/05/18 0542 02/06/18 0602 02/06/18 2059 02/07/18 0504  WBC 45.0* 45.0*  --  27.4*  HGB 8.0* 7.8* 8.0* 7.5*  HCT 28.4* 26.2* 26.4* 24.6*  PLT 1,224* 1,376*  --  790*  NA 139 136  --   --   K 5.2* 4.7  --   --   CL 111 108  --   --   CO2 25 24  --   --   BUN 20 22  --   --   CREATININE 0.92 0.95  --   --   GLUCOSE 134* 140*  --   --   CALCIUM 8.0* 8.1*  --   --     Examination: Neurologically intact ABD soft Neurovascular intact Sensation intact distally Intact pulses distally Dorsiflexion/Plantar flexion intact Incision: moderate drainage No cellulitis present Compartment soft} XR AP&Lat of hip shows well placed\fixed THA  Assessment:   3 Days  Post-Op Procedure(s) (LRB): RIGHT TOTAL HIP REVISION: REVISION OF RIGHT MONOPOLAR/CONVERSION TO TOTAL HIP (Right) ADDITIONAL DIAGNOSIS:  Expected Acute Blood Loss Anemia, Triphasic polycythemia vera, Hypertension, vertigo  Plan: PT/OT WBAT, THA,Will need to transfer 1 more unit of packed red blood cells and change dressing today.  Dressing had about 40% drainage.We will discontinue aspirin because of persistent blood loss.  DVT Prophylaxis: SCDx72 hrs. DISCHARGE PLAN: Skilled Nursing Facility/Rehab  DISCHARGE NEEDS: HHPT, Walker and 3-in-1 comode seat

## 2018-02-08 LAB — BPAM RBC
BLOOD PRODUCT EXPIRATION DATE: 201907152359
Blood Product Expiration Date: 201907152359
Blood Product Expiration Date: 201907152359
Blood Product Expiration Date: 201907242359
ISSUE DATE / TIME: 201906250101
ISSUE DATE / TIME: 201906251740
ISSUE DATE / TIME: 201906261421
ISSUE DATE / TIME: 201906270956
UNIT TYPE AND RH: 600
UNIT TYPE AND RH: 600
Unit Type and Rh: 6200
Unit Type and Rh: 6200

## 2018-02-08 LAB — HEMOGLOBIN AND HEMATOCRIT, BLOOD
HEMATOCRIT: 29.2 % — AB (ref 36.0–46.0)
Hemoglobin: 8.8 g/dL — ABNORMAL LOW (ref 12.0–15.0)

## 2018-02-08 LAB — TYPE AND SCREEN
ABO/RH(D): A POS
Antibody Screen: POSITIVE
DONOR AG TYPE: NEGATIVE
DONOR AG TYPE: NEGATIVE
DONOR AG TYPE: NEGATIVE
Donor AG Type: NEGATIVE
UNIT DIVISION: 0
UNIT DIVISION: 0
Unit division: 0
Unit division: 0

## 2018-02-08 NOTE — Care Management Important Message (Signed)
Important Message  Patient Details  Name: JORDYNN MARCELLA MRN: [id number] Date of Birth: 09-14-1940   Medicare Important Message Given:  Yes    Kerin Salen 02/08/2018, 10:11 AMImportant Message  Patient Details  Name: AASIYA CREASEY MRN: [id number] Date of Birth: Aug 16, 1940   Medicare Important Message Given:  Yes    Kerin Salen 02/08/2018, 10:11 AM

## 2018-02-08 NOTE — Progress Notes (Signed)
Patients dressing saturated with blood. Notified MD on call and reinforced dressing.

## 2018-02-08 NOTE — Progress Notes (Signed)
   02/08/18 1400  Advance Directives (For Healthcare)  Does Patient Have a Medical Advance Directive? Yes  Type of Advance Directive Living will;Healthcare Power of [place] in Chart? Yes  Copy of Living Will in Chart? Yes    Chaplain follow up to notarize advance directive.    Original and copies with pt.  Copy placed in chart.     [facility] / [facility] Chaplain Jerene Pitch, MDiv, [facility]

## 2018-02-08 NOTE — Progress Notes (Signed)
Physical Therapy Treatment Patient Details Name: Alejandra [name] MRN: [id number] DOB: Nov 07, 1940 Today's Date: 02/08/2018    History of Present Illness RIGHT TOTAL HIP REVISION: REVISION OF RIGHT MONOPOLAR/CONVERSION TO TOTAL HIP.h/o polycythemia vera, thrombocytosis, htn, bipolar hemiarthroplasy 2013, vertigo    PT Comments    Pt requested OOB so nursing was in room assisting pt with mobilizing when therapist entered the room. Pt continues to require Mod assist +2 for safe mobility. She was able to tolerate some weightbearing on R LE. She had difficulty advancing bil LEs. Multimodal cueing and increased time required to successfully pivot. Pt is weak and at high risk for falls. She will need continued rehab in a SNF. Will continue to follow and progress activity as tolerated.     Follow Up Recommendations  Follow surgeon's recommendation for DC plan and follow-up therapies(SNF)     Equipment Recommendations  None recommended by PT    Recommendations for Other Services       Precautions / Restrictions Precautions Precautions: Fall;Posterior Hip Precaution Comments: pt recalled 0/3 THP  Restrictions Weight Bearing Restrictions: No Other Position/Activity Restrictions: WBAT    Mobility  Bed Mobility Overal bed mobility: Needs Assistance Bed Mobility: Supine to Sit           General bed mobility comments: sitting EOB with nursing.  Transfers Overall transfer level: Needs assistance Equipment used: Rolling walker (2 wheeled) Transfers: Sit to/from Stand Sit to Stand: Mod assist;+2 safety/equipment Stand pivot transfers: Mod assist;+2 safety/equipment       General transfer comment: Assist to rise, stabilize, control descent. Multimodal cueing for safety, technique, sequence, proper use of RW. Stand pivot, bed to recliner (pt pivoted in place and recliner brought up behind her to safely sit). Intermittent jerking and LE buckling noted.   Ambulation/Gait              General Gait Details: NT- pt unable to tolerate on today   Stairs             Wheelchair Mobility    Modified Rankin (Stroke Patients Only)       Balance Overall balance assessment: Needs assistance         Standing balance support: Bilateral upper extremity supported Standing balance-Leahy Scale: Poor                              Cognition Arousal/Alertness: Awake/alert Behavior During Therapy: WFL for tasks assessed/performed Overall Cognitive Status: Within Functional Limits for tasks assessed                                        Exercises      General Comments        Pertinent Vitals/Pain Pain Assessment: Faces Faces Pain Scale: Hurts whole lot Pain Location: right  hip at rest and with activity Pain Descriptors / Indicators: Discomfort;Guarding;Sore;Grimacing;Aching Pain Intervention(s): Limited activity within patient's tolerance;Repositioned    Home Living                      Prior Function            PT Goals (current goals can now be found in the care plan section) Progress towards PT goals: Progressing toward goals    Frequency    7X/week      PT Plan Current plan remains appropriate  Co-evaluation              AM-PAC PT "6 Clicks" Daily Activity  Outcome Measure  Difficulty turning over in bed (including adjusting bedclothes, sheets and blankets)?: Unable Difficulty moving from lying on back to sitting on the side of the bed? : Unable Difficulty sitting down on and standing up from a chair with arms (e.g., wheelchair, bedside commode, etc,.)?: Unable Help needed moving to and from a bed to chair (including a wheelchair)?: A Lot Help needed walking in hospital room?: Total Help needed climbing 3-5 steps with a railing? : Total 6 Click Score: 7    End of Session Equipment Utilized During Treatment: Gait belt Activity Tolerance: Patient limited by fatigue;Patient limited by  pain Patient left: in chair;with call bell/phone within reach;with chair alarm set   PT Visit Diagnosis: Other abnormalities of gait and mobility (R26.89);Muscle weakness (generalized) (M62.81);Difficulty in walking, not elsewhere classified (R26.2)     Time: 3846-6599 PT Time Calculation (min) (ACUTE ONLY): 8 min  Charges:  $Therapeutic Activity: 8-22 mins                    G Codes:           Weston Anna, MPT Pager: [phone]

## 2018-02-08 NOTE — Progress Notes (Signed)
PATIENT ID: LEINA BABE  MRN: [id number]  DOB/AGE:  1941/07/06 / 77 y.o.  4 Days Post-Op Procedure(s) (LRB): RIGHT TOTAL HIP REVISION: REVISION OF RIGHT MONOPOLAR/CONVERSION TO TOTAL HIP (Right)    PROGRESS NOTE Subjective: Patient is alert, oriented, no Nausea, no Vomiting, yes passing gas, . Taking PO well. Denies SOB, Chest or Calf Pain. Using Incentive Spirometer, PAS in place. Ambulate WBAT with therapy limited due to op site bleeding Patient reports pain as  moderate  .    Objective: Vital signs in last 24 hours: Vitals:   02/07/18 1228 02/07/18 1307 02/07/18 2056 02/08/18 0552  BP: (!) 107/56 (!) 103/54 113/68 139/88  Pulse: 80 84 83 77  Resp: 16 14 16 16   Temp: 98.6 F (37 C) 98.2 F (36.8 C) 98.3 F (36.8 C) 98.7 F (37.1 C)  TempSrc:  Oral Oral Oral  SpO2: 100% 99% 95% 100%  Weight:      Height:          Intake/Output from previous day: I/O last 3 completed shifts: In: 4927.7 [P.O.:1710; I.V.:2866.7; Blood:351] Out: 5000 [Urine:5000]   Intake/Output this shift: Total I/O In: 120 [P.O.:120] Out: 550 [Urine:550]   LABORATORY DATA: Recent Labs    02/06/18 0602 02/06/18 2059 02/07/18 0504  WBC 45.0*  --  27.4*  HGB 7.8* 8.0* 7.5*  HCT 26.2* 26.4* 24.6*  PLT 1,376*  --  790*  NA 136  --   --   K 4.7  --   --   CL 108  --   --   CO2 24  --   --   BUN 22  --   --   CREATININE 0.95  --   --   GLUCOSE 140*  --   --   CALCIUM 8.1*  --   --     Examination: Neurologically intact Neurovascular intact Sensation intact distally Intact pulses distally Dorsiflexion/Plantar flexion intact Incision: Pt's dressing had soaked through.  Wound was expressed with old blood and fibrinous material.  New dressing of steri strips, 4x4's and ABD pad placed over incision . No cellulitis present Compartment soft} XR AP&Lat of hip shows well placed\fixed THA  Assessment:   4 Days Post-Op Procedure(s) (LRB): RIGHT TOTAL HIP REVISION: REVISION OF RIGHT  MONOPOLAR/CONVERSION TO TOTAL HIP (Right) ADDITIONAL DIAGNOSIS:  Expected Acute Blood Loss Anemia, Hypertension and Triphasic polycythemia vera, vertigo  Plan: PT/OT WBAT, THA posterior approach DVT Prophylaxis: SCDx72 hrs,   DISCHARGE PLAN: Skilled Nursing Facility/Rehab  DISCHARGE NEEDS: HHPT, Walker and 3-in-1 comode seat

## 2018-02-08 NOTE — Progress Notes (Signed)
PT Cancellation Note  Patient Details Name: Alejandra [name] MRN: [id number] DOB: 1941-03-26   Cancelled Treatment:    Reason Eval/Treat Not Completed: Medical issues which prohibited therapy. Pt is still having issues with op site bleeding. Spoke with RN and pt-will hold PT for now. Will possibly attempt mobility later today.    Weston Anna, MPT Pager: [phone]

## 2018-02-08 NOTE — Progress Notes (Signed)
Received notification that pt has received approved [facility] authorization for admission to [facility].  Sharren Bridge, MSW, LCSW Clinical Social Work 02/08/2018 [phone] Coverage for [phone]

## 2018-02-09 LAB — AEROBIC/ANAEROBIC CULTURE (SURGICAL/DEEP WOUND)

## 2018-02-09 LAB — AEROBIC/ANAEROBIC CULTURE W GRAM STAIN (SURGICAL/DEEP WOUND): Culture: NO GROWTH

## 2018-02-09 LAB — HEMOGLOBIN AND HEMATOCRIT, BLOOD
HCT: 24 % — ABNORMAL LOW (ref 36.0–46.0)
Hemoglobin: 7.2 g/dL — ABNORMAL LOW (ref 12.0–15.0)

## 2018-02-09 LAB — PREPARE RBC (CROSSMATCH)

## 2018-02-09 MED ORDER — SODIUM CHLORIDE 0.9% IV SOLUTION
Freq: Once | INTRAVENOUS | Status: AC
  Administered 2018-02-09: 13:00:00 via INTRAVENOUS

## 2018-02-09 NOTE — Progress Notes (Signed)
Physical Therapy Treatment Patient Details Name: Alejandra [name] MRN: [id number] DOB: 06-05-1941 Today's Date: 02/09/2018    History of Present Illness RIGHT TOTAL HIP REVISION: REVISION OF RIGHT MONOPOLAR/CONVERSION TO TOTAL HIP.h/o polycythemia vera, thrombocytosis, htn, bipolar hemiarthroplasy 2013, vertigo    PT Comments    OOB deferred d/t pt fatigued from just getting bath, Hgb down again today to 7.2; pt agreeable to exercises; continue PT POC  Follow Up Recommendations  Follow surgeon's recommendation for DC plan and follow-up therapies(SNF)     Equipment Recommendations  None recommended by PT    Recommendations for Other Services       Precautions / Restrictions Precautions Precautions: Fall;Posterior Hip Precaution Comments: pt recalled 0/3 THP; THP reviewed Restrictions Weight Bearing Restrictions: No Other Position/Activity Restrictions: WBAT    Mobility  Bed Mobility Overal bed mobility: Needs Assistance Bed Mobility: Rolling Rolling: Mod assist         General bed mobility comments: rolling for pericare and finishing bath with nursing, pt fatigued  Transfers                 General transfer comment: (eferred d/t fatigue and low Hgb 7.2)  Ambulation/Gait                 Stairs             Wheelchair Mobility    Modified Rankin (Stroke Patients Only)       Balance                                            Cognition Arousal/Alertness: Awake/alert Behavior During Therapy: WFL for tasks assessed/performed Overall Cognitive Status: Within Functional Limits for tasks assessed                                        Exercises Total Joint Exercises Ankle Circles/Pumps: AROM;Both;10 reps Quad Sets: AROM;Strengthening;Both;10 reps Short Arc Quad: AROM;Both;10 reps Heel Slides: AAROM;AROM;Both;10 reps Hip ABduction/ADduction: AROM;AAROM;Both;10 reps    General Comments         Pertinent Vitals/Pain Pain Assessment: Faces Faces Pain Scale: Hurts even more Pain Location: right  hip at rest and with activity Pain Descriptors / Indicators: Discomfort;Guarding;Sore;Grimacing;Aching Pain Intervention(s): Monitored during session;Ice applied    Home Living                      Prior Function            PT Goals (current goals can now be found in the care plan section) Acute Rehab PT Goals Patient Stated Goal: to go to rehab, walk PT Goal Formulation: With patient Time For Goal Achievement: 02/12/18 Potential to Achieve Goals: Good Progress towards PT goals: Progressing toward goals(slowly)    Frequency    7X/week      PT Plan Current plan remains appropriate    Co-evaluation              AM-PAC PT "6 Clicks" Daily Activity  Outcome Measure  Difficulty turning over in bed (including adjusting bedclothes, sheets and blankets)?: Unable Difficulty moving from lying on back to sitting on the side of the bed? : Unable Difficulty sitting down on and standing up from a chair with arms (e.g., wheelchair, bedside commode, etc,.)?: Unable Help  needed moving to and from a bed to chair (including a wheelchair)?: A Lot Help needed walking in hospital room?: Total Help needed climbing 3-5 steps with a railing? : Total 6 Click Score: 7    End of Session   Activity Tolerance: Patient limited by fatigue;Treatment limited secondary to medical complications (Comment)(Hgb 7.2 (has had 4units this admission)) Patient left: in bed;with call bell/phone within reach;with bed alarm set Nurse Communication: Mobility status PT Visit Diagnosis: Other abnormalities of gait and mobility (R26.89);Muscle weakness (generalized) (M62.81);Difficulty in walking, not elsewhere classified (R26.2)     Time: 0375-4360 PT Time Calculation (min) (ACUTE ONLY): 15 min  Charges:  $Therapeutic Exercise: 8-22 mins                    G CodesKenyon Ana,  PT Pager: [phone] 02/09/2018    [facility] 02/09/2018, 11:53 AM

## 2018-02-09 NOTE — Progress Notes (Signed)
[name]  MRN: [id number]  DOB/AGE:  1941/07/09 / 77 y.o.  5 Days Post-Op Procedure(s) (LRB): RIGHT TOTAL HIP REVISION: REVISION OF RIGHT MONOPOLAR/CONVERSION TO TOTAL HIP (Right)    PROGRESS NOTE Subjective: Patient is alert, oriented, no Nausea, no Vomiting, yes passing gas, . Taking PO well. Denies SOB, Chest or Calf Pain. Using Incentive Spirometer, PAS in place. Ambulate WBAT with pt limited to transfers due to drainage Patient reports pain as  mild .    Objective: Vital signs in last 24 hours: Vitals:   02/08/18 0933 02/08/18 1355 02/08/18 2013 02/09/18 0551  BP: (!) 144/77 132/78 112/64 (!) 109/57  Pulse:  81 71 74  Resp:  18 13 15   Temp:  98.9 F (37.2 C) 98.2 F (36.8 C) 97.8 F (36.6 C)  TempSrc:  Oral Oral Oral  SpO2:  94% 100% 96%  Weight:      Height:          Intake/Output from previous day: I/O last 3 completed shifts: In: 3451.7 [P.O.:960; I.V.:2491.7] Out: 3000 [Urine:3000]   Intake/Output this shift: No intake/output data recorded.   LABORATORY DATA: Recent Labs    02/07/18 0504 02/08/18 1031  WBC 27.4*  --   HGB 7.5* 8.8*  HCT 24.6* 29.2*  PLT 790*  --     Examination: Neurologically intact Neurovascular intact Sensation intact distally Intact pulses distally Dorsiflexion/Plantar flexion intact Incision: bolstered dressing is dry at this time No cellulitis present Compartment soft} XR AP&Lat of hip shows well placed\fixed THA  Assessment:   5 Days Post-Op Procedure(s) (LRB): RIGHT TOTAL HIP REVISION: REVISION OF RIGHT MONOPOLAR/CONVERSION TO TOTAL HIP (Right) ADDITIONAL DIAGNOSIS:  Expected Acute Blood Loss Anemia, Hypertension and Triphasic polycythemia vera, vertigo  Plan: PT/OT WBAT, THA posterior approach  DVT Prophylaxis: SCDx72 hrs,    DISCHARGE PLAN: Skilled Nursing Facility/Rehab  DISCHARGE NEEDS: HHPT, Walker and 3-in-1 comode seat

## 2018-02-09 NOTE — Progress Notes (Signed)
Per LAB patient needs another type and screen order in order to process [facility] request. Order placed in system.

## 2018-02-09 NOTE — Progress Notes (Signed)
Assumed care of patient. Patient resting comfortably in bed with no complaints. No signs and symptoms of distress. Will continue to monitor.

## 2018-02-09 NOTE — Progress Notes (Signed)
PHARMACY BRIEF NOTE: HYDROXYUREA   By [facility] policy, hydroxyurea is automatically held when any of the following laboratory values occur:  - patient's taking hydrea for polycythemia vera ANC < 2 K  Pltc < 80K in sickle-cell patients; < 100K in other patients  Hgb <= 6 in sickle-cell patients; < 8 in other patients (hgb 7.2 on 6/29) Reticulocytes < 80K when Hgb < 9  Hydroxyurea has been held (discontinued from profile) per policy.   Dia Sitter, PharmD, BCPS 02/09/2018 11:33 AM'

## 2018-02-10 LAB — HEMOGLOBIN AND HEMATOCRIT, BLOOD
HCT: 27.3 % — ABNORMAL LOW (ref 36.0–46.0)
Hemoglobin: 8.5 g/dL — ABNORMAL LOW (ref 12.0–15.0)

## 2018-02-10 NOTE — Progress Notes (Signed)
Physical Therapy Treatment Patient Details Name: Alejandra [name] MRN: [id number] DOB: 09/03/1940 Today's Date: 02/10/2018    History of Present Illness RIGHT TOTAL HIP REVISION: REVISION OF RIGHT MONOPOLAR/CONVERSION TO TOTAL HIP.h/o polycythemia vera, thrombocytosis, htn, bipolar hemiarthroplasy 2013, vertigo    PT Comments    Progressing slowly toward mobility goals, limited by dizziness and uncoordinated/jerking/ataxic type movements  X 4 extremities (LEs>UEs); continue PT POC  Follow Up Recommendations  Follow surgeon's recommendation for DC plan and follow-up therapies(SNF)     Equipment Recommendations  None recommended by PT    Recommendations for Other Services       Precautions / Restrictions Precautions Precautions: Fall;Posterior Hip Precaution Comments: pt recalled 0/3 THP; THP reviewed Restrictions Weight Bearing Restrictions: No Other Position/Activity Restrictions: WBAT    Mobility  Bed Mobility Overal bed mobility: Needs Assistance Bed Mobility: Supine to Sit Rolling: Min assist         General bed mobility comments: incr time needed, multi-modal cues for technique and hip precautions  Transfers Overall transfer level: Needs assistance Equipment used: Rolling walker (2 wheeled) Transfers: Sit to/from Stand Sit to Stand: Min assist;Mod assist Stand pivot transfers: Mod assist;Max assist       General transfer comment: 3 trials sit>stand; assist to rise and stabilize, pt with c/o dizziness with transitions to sitting and standing, resolved after ~ 30minutes;  stand pivot bed to chair with bil LE support to prevent buckling d/t  uncontrolled, jerking movements  Ambulation/Gait             General Gait Details: NT- pt unable to tolerate today   Stairs             Wheelchair Mobility    Modified Rankin (Stroke Patients Only)       Balance   Sitting-balance support: Feet supported;Bilateral upper extremity supported;Single  extremity supported Sitting balance-Leahy Scale: Fair     Standing balance support: Bilateral upper extremity supported Standing balance-Leahy Scale: Poor                              Cognition Arousal/Alertness: Awake/alert Behavior During Therapy: WFL for tasks assessed/performed Overall Cognitive Status: Impaired/Different from baseline Area of Impairment: Following commands;Problem solving                       Following Commands: Follows one step commands with increased time     Problem Solving: Difficulty sequencing;Requires verbal cues;Requires tactile cues        Exercises Total Joint Exercises Ankle Circles/Pumps: AROM;Both;10 reps    General Comments        Pertinent Vitals/Pain Pain Assessment: Faces Faces Pain Scale: Hurts little more Pain Location: right  hip at rest and with activity Pain Descriptors / Indicators: Discomfort;Guarding;Sore;Grimacing;Aching Pain Intervention(s): Monitored during session;Repositioned    Home Living                      Prior Function            PT Goals (current goals can now be found in the care plan section) Acute Rehab PT Goals Patient Stated Goal: to go to rehab, walk PT Goal Formulation: With patient Time For Goal Achievement: 02/12/18 Potential to Achieve Goals: Good Progress towards PT goals: Progressing toward goals(slowly)    Frequency    7X/week      PT Plan Current plan remains appropriate    Co-evaluation  AM-PAC PT "6 Clicks" Daily Activity  Outcome Measure  Difficulty turning over in bed (including adjusting bedclothes, sheets and blankets)?: Unable Difficulty moving from lying on back to sitting on the side of the bed? : Unable Difficulty sitting down on and standing up from a chair with arms (e.g., wheelchair, bedside commode, etc,.)?: Unable Help needed moving to and from a bed to chair (including a wheelchair)?: A Lot Help needed walking  in hospital room?: Total Help needed climbing 3-5 steps with a railing? : Total 6 Click Score: 7    End of Session Equipment Utilized During Treatment: Gait belt Activity Tolerance: Patient limited by fatigue Patient left: in chair;with call bell/phone within reach;with chair alarm set Nurse Communication: Mobility status PT Visit Diagnosis: Other abnormalities of gait and mobility (R26.89);Muscle weakness (generalized) (M62.81);Difficulty in walking, not elsewhere classified (R26.2)     Time: 8921-1941 PT Time Calculation (min) (ACUTE ONLY): 28 min  Charges:  $Therapeutic Activity: 23-37 mins                    G CodesKenyon Ana, PT Pager: [phone] 02/10/2018    Kenyon Ana 02/10/2018, 2:44 PM

## 2018-02-10 NOTE — Progress Notes (Signed)
PATIENT ID: Alejandra MASTROPIETRO  MRN: [id number]  DOB/AGE:  1941-07-10 / 77 y.o.  6 Days Post-Op Procedure(s) (LRB): RIGHT TOTAL HIP REVISION: REVISION OF RIGHT MONOPOLAR/CONVERSION TO TOTAL HIP (Right)    PROGRESS NOTE Subjective: Patient is alert, oriented, no Nausea, no Vomiting, yes passing gas, . Taking PO well, with pt eating breakfast. Denies SOB, Chest or Calf Pain. Using Incentive Spirometer, PAS in place. Ambulate WBAT with therapy limited by dizziness Patient reports pain as  mild .    Objective: Vital signs in last 24 hours: Vitals:   02/09/18 1447 02/09/18 1510 02/09/18 1750 02/09/18 2132  BP: 108/62 (!) 109/55 110/72 (!) 103/54  Pulse: 81 78 73 79  Resp: 16   16  Temp: 98.2 F (36.8 C) 98.6 F (37 C) 98.9 F (37.2 C) 98.9 F (37.2 C)  TempSrc: Oral Oral Oral   SpO2: 100% 100% 100% 100%  Weight:      Height:          Intake/Output from previous day: I/O last 3 completed shifts: In: 956 [P.O.:420; Blood:358] Out: 1600 [LOVFI:4332]   Intake/Output this shift: No intake/output data recorded.   LABORATORY DATA: Recent Labs    02/09/18 0752 02/10/18 0904  HGB 7.2* 8.5*  HCT 24.0* 27.3*    Examination: Neurologically intact Neurovascular intact Sensation intact distally Intact pulses distally Dorsiflexion/Plantar flexion intact Incision: Current bolstered dressing is clean with no drainage No cellulitis present Compartment soft} XR AP&Lat of hip shows well placed\fixed THA  Assessment:   6 Days Post-Op Procedure(s) (LRB): RIGHT TOTAL HIP REVISION: REVISION OF RIGHT MONOPOLAR/CONVERSION TO TOTAL HIP (Right) ADDITIONAL DIAGNOSIS:  Expected Acute Blood Loss Anemia, Hypertension and Triphasic polycythemia vera, vertigo  Plan: PT/OT WBAT, THA  DVT Prophylaxis: SCDx72 hrs,   DISCHARGE PLAN: Skilled Nursing Facility/Rehab, when hgb stable  DISCHARGE NEEDS: HHPT, Walker and 3-in-1 comode seat

## 2018-02-11 LAB — TYPE AND SCREEN
ABO/RH(D): A POS
Antibody Screen: POSITIVE
Donor AG Type: NEGATIVE
Unit division: 0

## 2018-02-11 LAB — BPAM RBC
BLOOD PRODUCT EXPIRATION DATE: 201907112359
ISSUE DATE / TIME: 201906291457
Unit Type and Rh: 6200

## 2018-02-11 LAB — HEMOGLOBIN AND HEMATOCRIT, BLOOD
HCT: 26.9 % — ABNORMAL LOW (ref 36.0–46.0)
Hemoglobin: 8.3 g/dL — ABNORMAL LOW (ref 12.0–15.0)

## 2018-02-11 NOTE — Discharge Summary (Addendum)
[name] MRN: [id number] DOB/AGE: 1941/03/05 77 y.o.  Admit date: 02/04/2018 Discharge date: 02/12/2018  Admission Diagnoses:  Principal Problem:   Failed total hip arthroplasty [facility]) Active Problems:   Primary osteoarthritis of right hip   Discharge Diagnoses:  Same  Past Medical History:  Diagnosis Date  . Arthritis   . Avascular necrosis of femur head, right ([place]) 04/23/2012  . Cellulitis of shoulder 09/28/2014  . Fracture 10/05/11   "fell and broke right hip"  . Fracture of femoral neck, right ([place]) 10/12/2011  . Glaucoma   . Glaucoma    bilateral  . Hypertension   . Polycythemia vera(238.4)   . Rhabdomyolysis 09/28/2014  . Thrombocytosis ([place]) 04/23/2012    Surgeries: Procedure(s): RIGHT TOTAL HIP REVISION: REVISION OF RIGHT MONOPOLAR/CONVERSION TO TOTAL HIP on 02/04/2018   Consultants:   Discharged Condition: Improved  Hospital Course: DAKIA SCHIFANO is an 77 y.o. female who was admitted 02/04/2018 for operative treatment ofFailed total hip arthroplasty ([place]). Patient has severe unremitting pain that affects sleep, daily activities, and work/hobbies. After pre-op clearance the patient was taken to the operating room on 02/04/2018 and underwent  Procedure(s): RIGHT TOTAL HIP REVISION: REVISION OF RIGHT MONOPOLAR/CONVERSION TO TOTAL HIP.    Patient was given perioperative antibiotics:  Anti-infectives (From admission, onward)   Start     Dose/Rate Route Frequency Ordered Stop   02/04/18 1115  ceFAZolin (ANCEF) IVPB 2g/100 mL premix     2 g 200 mL/hr over 30 Minutes Intravenous On call to O.R. 02/04/18 1110 02/04/18 1502       Patient was given sequential compression devices, early ambulation, and chemoprophylaxis to prevent DVT.  Patient benefited maximally from hospital stay and there were no complications.    Recent vital signs:  Patient Vitals for the past 24 hrs:  BP Temp Temp src Pulse Resp SpO2  02/11/18 1430 132/65 99.9 F (37.7 C) Oral 84  18 100 %  02/11/18 0530 124/72 98.7 F (37.1 C) - 82 15 100 %  02/10/18 1959 121/64 98.7 F (37.1 C) - 89 17 100 %     Recent laboratory studies:  Recent Labs    02/10/18 0904 02/11/18 0655  HGB 8.5* 8.3*  HCT 27.3* 26.9*     Discharge Medications:   Allergies as of 02/11/2018   No Known Allergies     Medication List    STOP taking these medications   aspirin EC 81 MG tablet     TAKE these medications   ALPRAZolam 0.25 MG tablet Commonly known as:  XANAX Take 0.25 mg by mouth 2 (two) times daily as needed for anxiety.   amLODipine 10 MG tablet Commonly known as:  NORVASC Take 10 mg by mouth daily.   atorvastatin 10 MG tablet Commonly known as:  LIPITOR Take 10 mg by mouth at bedtime.   benazepril 40 MG tablet Commonly known as:  LOTENSIN Take 40 mg by mouth daily.   hydroxyurea 500 MG capsule Commonly known as:  HYDREA Take 1 tablet (500mg ) every MWF. Take 2 tablets (1,000mg ) all other days What changed:    how much to take  how to take this  when to take this  additional instructions   loratadine 10 MG tablet Commonly known as:  CLARITIN Take 10 mg by mouth daily as needed for allergies.   meclizine 25 MG tablet Commonly known as:  ANTIVERT Take 25 mg by mouth 3 (three) times daily as needed for dizziness.   oxyCODONE-acetaminophen 5-325  MG tablet Commonly known as:  PERCOCET/ROXICET Take 1 tablet by mouth every 4 (four) hours as needed for severe pain.   SIMBRINZA 1-0.2 % Susp Generic drug:  Brinzolamide-Brimonidine Place 1 drop into both eyes 2 (two) times daily.   tiZANidine 2 MG tablet Commonly known as:  ZANAFLEX Take 1 tablet (2 mg total) by mouth every 6 (six) hours as needed.   TRAVATAN Z 0.004 % Soln ophthalmic solution Generic drug:  Travoprost (BAK Free) Place 1 drop into both eyes at bedtime.            Durable Medical Equipment  (From admission, onward)        Start     Ordered   02/04/18 1825  DME Walker rolling   Once    Question:  Patient needs a walker to treat with the following condition  Answer:  Status post right hip replacement   02/04/18 1824   02/04/18 1825  DME 3 n 1  Once     02/04/18 1824       Discharge Care Instructions  (From admission, onward)        Start     Ordered   02/11/18 0000  Weight bearing as tolerated     02/11/18 1500   02/06/18 0000  Weight bearing as tolerated     02/06/18 0742      Diagnostic Studies: Dg Chest 2 View  Result Date: 01/29/2018 CLINICAL DATA:  Preop, former smoking history EXAM: CHEST - 2 VIEW COMPARISON:  Chest x-ray of 09/27/2014 FINDINGS: No active infiltrate or effusion is seen. Mediastinal hilar contours are unremarkable. Mild cardiomegaly is stable. There are significant degenerative changes in both shoulders. Degenerative spurring is noted in the mid to lower thoracic spine as well. IMPRESSION: 1. No active lung disease. 2. Stable borderline cardiomegaly. Electronically Signed   By: Ivar Drape M.D.   On: 01/29/2018 13:43   Dg C-arm 1-60 Min-no Report  Result Date: 02/04/2018 Fluoroscopy was utilized by the requesting physician.  No radiographic interpretation.   Dg Hip Operative Unilat W Or W/o Pelvis Right  Result Date: 02/04/2018 CLINICAL DATA:  Right hip revision. FLUOROSCOPY TIME:  40 seconds. EXAM: OPERATIVE RIGHT HIP (WITH PELVIS IF PERFORMED) 7 VIEWS TECHNIQUE: Fluoroscopic spot image(s) were submitted for interpretation post-operatively. COMPARISON:  None. FINDINGS: The patient is status post revision of the right hip replacement. By the end of the study, the acetabular and femoral components are in good position. IMPRESSION: Status post right hip replacement revision as above. Electronically Signed   By: Dorise Bullion III M.D   On: 02/04/2018 16:52   Dg Hip Unilat W Or Wo Pelvis 2-3 Views Right  Result Date: 01/26/2018 CLINICAL DATA:  77 year old female with a history of generalized right hip pain with no injury EXAM: DG HIP  (WITH OR WITHOUT PELVIS) 2-3V RIGHT COMPARISON:  11/07/2015 FINDINGS: Osteopenia. Surgical changes of right hip arthroplasty. Heterotopic ossification at the right hip. Components appear congruent. Degenerative changes of the left hip. No acute displaced fracture identified. IMPRESSION: Negative for acute displaced fracture. Surgical changes of right hip arthroplasty with no acute problem identified with the hardware. Heterotopic ossification again noted. Electronically Signed   By: Corrie Mckusick D.O.   On: 01/26/2018 14:51    Disposition: Discharge disposition: 03-Skilled Nursing Facility       Discharge Instructions    Call MD / Call 911   Complete by:  As directed    If you experience chest pain or shortness  of breath, CALL 911 and be transported to the hospital emergency room.  If you develope a fever above 101 F, pus (white drainage) or increased drainage or redness at the wound, or calf pain, call your surgeon's office.   Call MD / Call 911   Complete by:  As directed    If you experience chest pain or shortness of breath, CALL 911 and be transported to the hospital emergency room.  If you develope a fever above 101 F, pus (white drainage) or increased drainage or redness at the wound, or calf pain, call your surgeon's office.   Constipation Prevention   Complete by:  As directed    Drink plenty of fluids.  Prune juice may be helpful.  You may use a stool softener, such as Colace (over the counter) 100 mg twice a day.  Use MiraLax (over the counter) for constipation as needed.   Constipation Prevention   Complete by:  As directed    Drink plenty of fluids.  Prune juice may be helpful.  You may use a stool softener, such as Colace (over the counter) 100 mg twice a day.  Use MiraLax (over the counter) for constipation as needed.   Diet - low sodium heart healthy   Complete by:  As directed    Diet - low sodium heart healthy   Complete by:  As directed    Driving restrictions   Complete  by:  As directed    No driving for 2 weeks   Driving restrictions   Complete by:  As directed    No driving for 2 weeks   Increase activity slowly as tolerated   Complete by:  As directed    Increase activity slowly as tolerated   Complete by:  As directed    Patient may shower   Complete by:  As directed    You may shower without a dressing once there is no drainage.  Do not wash over the wound.  If drainage remains, cover wound with plastic wrap and then shower.   Patient may shower   Complete by:  As directed    You may shower without a dressing once there is no drainage.  Do not wash over the wound.  If drainage remains, cover wound with plastic wrap and then shower.   Weight bearing as tolerated   Complete by:  As directed    Weight bearing as tolerated   Complete by:  As directed        Contact information for follow-up providers    Frederik Pear, MD In 2 weeks.   Specialty:  Orthopedic Surgery Contact information: [place] [address] [phone]            Contact information for after-discharge care    Destination    [facility] .   Service:  Skilled Nursing Contact information: [place] [phone]                   Signed: Joanell Rising 02/11/2018, 3:02 PM

## 2018-02-11 NOTE — Progress Notes (Signed)
[name]  MRN: [id number]  DOB/AGE:  78-09-1940 / 77 y.o.  7 Days Post-Op Procedure(s) (LRB): RIGHT TOTAL HIP REVISION: REVISION OF RIGHT MONOPOLAR/CONVERSION TO TOTAL HIP (Right)    PROGRESS NOTE Subjective: Patient is alert, oriented, no Nausea, no Vomiting, yes passing gas, . Taking PO well. Denies SOB, Chest or Calf Pain. Using Incentive Spirometer, PAS in place. Ambulate WBAT with pt working on transfers, limited by jerking and ataxia Patient reports pain as  mild.    Objective: Vital signs in last 24 hours: Vitals:   02/10/18 1021 02/10/18 1307 02/10/18 1959 02/11/18 0530  BP: 138/72 128/79 121/64 124/72  Pulse:  76 89 82  Resp:  15 17 15   Temp:  99 F (37.2 C) 98.7 F (37.1 C) 98.7 F (37.1 C)  TempSrc:  Oral    SpO2:  99% 100% 100%  Weight:      Height:          Intake/Output from previous day: I/O last 3 completed shifts: In: 420 [P.O.:420] Out: 2300 [Urine:2300]   Intake/Output this shift: No intake/output data recorded.   LABORATORY DATA: Recent Labs    02/10/18 0904 02/11/18 0655  HGB 8.5* 8.3*  HCT 27.3* 26.9*    Examination: Neurologically intact Neurovascular intact Sensation intact distally Intact pulses distally Dorsiflexion/Plantar flexion intact Incision: bolstered dressing has been intact for 3 days with no increased drainage No cellulitis present Compartment soft} XR AP&Lat of hip shows well placed\fixed THA  Assessment:   7 Days Post-Op Procedure(s) (LRB): RIGHT TOTAL HIP REVISION: REVISION OF RIGHT MONOPOLAR/CONVERSION TO TOTAL HIP (Right) ADDITIONAL DIAGNOSIS:  Expected Acute Blood Loss Anemia, Hypertension and Triphasic polycythemia vera, vertigo  Plan: PT/OT WBAT, THA  DVT Prophylaxis: SCDx72 hrs,   DISCHARGE PLAN: Skilled Nursing Facility/Rehab  DISCHARGE NEEDS: HHPT, Walker and 3-in-1 comode seat

## 2018-02-11 NOTE — Progress Notes (Signed)
Changed dressing twice and applied new steri strips.  The incision is oozing bright red blood. Waiting on call back from PA.

## 2018-02-11 NOTE — Progress Notes (Signed)
Per PA, hold d/c for now.

## 2018-02-11 NOTE — Clinical Social Work Placement (Addendum)
D/C Summary sent.  Nurse given number to call report.  PTAR arranged for transport.   CLINICAL SOCIAL WORK PLACEMENT  NOTE  Date:  02/11/2018  Patient Details  Name: Alejandra [name] MRN: [id number] Date of Birth: 07-May-1941  Clinical Social Work is seeking post-discharge placement for this patient at the [place] level of care (*CSW will initial, date and re-position this form in  chart as items are completed):  Yes   Patient/family provided with [place] Work Department's list of facilities offering this level of care within the geographic area requested by the patient (or if unable, by the patient's family).  Yes   Patient/family informed of their freedom to choose among providers that offer the needed level of care, that participate in Medicare, Medicaid or managed care program needed by the patient, have an available bed and are willing to accept the patient.  Yes   Patient/family informed of [facility]'s ownership interest in [facility] and [facility], as well as of the fact that they are under no obligation to receive care at these facilities.  PASRR submitted to EDS on       PASRR number received on       Existing PASRR number confirmed on 02/06/18     FL2 transmitted to all facilities in geographic area requested by pt/family on       FL2 transmitted to all facilities within larger geographic area on 02/11/18     Patient informed that his/her managed care company has contracts with or will negotiate with certain facilities, including the following:  [place]     Yes   Patient/family informed of bed offers received.  Patient chooses bed at [facility]     Physician recommends and patient chooses bed at      Patient to be transferred to [facility] on 02/12/18.  Patient to be transferred to facility by PTAR      Patient family notified on 02/12/18 of transfer.  Name of family member notified:  Sister: Rosa       PHYSICIAN Please prepare priority discharge summary, including medications     Additional Comment:    _______________________________________________ Lia Hopping, LCSW 02/11/2018, 4:03 PM

## 2018-02-11 NOTE — Progress Notes (Addendum)
Physical Therapy Treatment Patient Details Name: Alejandra [name] MRN: [id number] DOB: [birth year] Today's Date: 02/11/2018    History of Present Illness RIGHT TOTAL HIP REVISION: REVISION OF RIGHT MONOPOLAR/CONVERSION TO TOTAL HIP.h/o polycythemia vera, thrombocytosis, htn, bipolar hemiarthroplasy 2013, vertigo    PT Comments    POD #7 Assisted patient with supine to sit, sit to stand, and ambulation. Patient needed +2 bilateral underarm mod assist to stand and min assist during ambulation. Patient was able to ambulate 25 feet with RW with step-to gait pattern with significantly decreased step length and wide base of support. Returned patient to chair and noticed dressing over incision had bled through. Notified RN and returned patient to bed and positioned in side lying. Assisted RN with applying new dressing. RN stated she would attempt to notify patients doctor.    Follow Up Recommendations  Follow surgeon's recommendation for DC plan and follow-up therapies  SNF   Equipment Recommendations  None recommended by PT    Recommendations for Other Services       Precautions / Restrictions Precautions Precautions: Fall;Posterior Hip Precaution Comments: pt recalled 0/3 THP; THP reviewed Restrictions Weight Bearing Restrictions: No Other Position/Activity Restrictions: WBAT    Mobility  Bed Mobility Overal bed mobility: Needs Assistance Bed Mobility: Supine to Sit Rolling: Min assist   Supine to sit: Min assist(physcial assist with R LE) Sit to supine: Min assist(physical assist with Bil LE)      Transfers Overall transfer level: Needs assistance Equipment used: Rolling walker (2 wheeled) Transfers: Sit to/from Stand Sit to Stand: Mod assist;+2 physical assistance         General transfer comment: bil underarm physical assist for sit to stand  Ambulation/Gait Ambulation/Gait assistance: Min assist;+2 physical assistance Gait Distance (Feet): 25 Feet Assistive device:  Rolling walker (2 wheeled) Gait Pattern/deviations: Step-to pattern;Decreased step length - right;Decreased step length - left;Decreased stance time - right;Shuffle;Antalgic;Wide base of support     General Gait Details: bil underarm physcial assist to maintain standing posture   Stairs             Wheelchair Mobility    Modified Rankin (Stroke Patients Only)       Balance                                            Cognition Arousal/Alertness: Awake/alert Behavior During Therapy: WFL for tasks assessed/performed Overall Cognitive Status: Within Functional Limits for tasks assessed                                        Exercises Total Joint Exercises Ankle Circles/Pumps: AROM;15 reps;Seated;Both Long Arc Quad: AROM;Right;10 reps;Seated    General Comments        Pertinent Vitals/Pain Pain Assessment: 0-10 Pain Score: 5  Pain Location: right  hip at rest and with activity Pain Descriptors / Indicators: Aching;Sore    Home Living                      Prior Function            PT Goals (current goals can now be found in the care plan section) Progress towards PT goals: Progressing toward goals    Frequency    7X/week      PT Plan  Current plan remains appropriate    Co-evaluation              AM-PAC PT "6 Clicks" Daily Activity  Outcome Measure  Difficulty turning over in bed (including adjusting bedclothes, sheets and blankets)?: Unable Difficulty moving from lying on back to sitting on the side of the bed? : Unable Difficulty sitting down on and standing up from a chair with arms (e.g., wheelchair, bedside commode, etc,.)?: Unable Help needed moving to and from a bed to chair (including a wheelchair)?: A Lot Help needed walking in hospital room?: Total Help needed climbing 3-5 steps with a railing? : Total 6 Click Score: 7    End of Session Equipment Utilized During Treatment: Gait  belt Activity Tolerance: Patient tolerated treatment well;Other (comment)(Patient limited by LE weakness) Patient left: in bed;with call bell/phone within reach Nurse Communication: Other (comment)(Notified RN of fresh bleeding from incision after ambulation) PT Visit Diagnosis: Other abnormalities of gait and mobility (R26.89);Muscle weakness (generalized) (M62.81);Difficulty in walking, not elsewhere classified (R26.2)     Time: 1530-1620 PT Time Calculation (min) (ACUTE ONLY): 50 min  Charges:  $Gait Training: 8-22 mins $Therapeutic Activity: 23-37 mins                    G Codes:       Rachel Bo. SPTA 02/11/2018, 4:39 PM   Direct Supervision throughout session  Rica Koyanagi  PTA [facility]  Acute  Rehab Pager      [phone]

## 2018-02-12 DIAGNOSIS — R293 Abnormal posture: Secondary | ICD-10-CM | POA: Diagnosis not present

## 2018-02-12 DIAGNOSIS — M25551 Pain in right hip: Secondary | ICD-10-CM | POA: Diagnosis not present

## 2018-02-12 DIAGNOSIS — M1990 Unspecified osteoarthritis, unspecified site: Secondary | ICD-10-CM | POA: Diagnosis not present

## 2018-02-12 DIAGNOSIS — I10 Essential (primary) hypertension: Secondary | ICD-10-CM | POA: Diagnosis not present

## 2018-02-12 DIAGNOSIS — M6281 Muscle weakness (generalized): Secondary | ICD-10-CM | POA: Diagnosis not present

## 2018-02-12 DIAGNOSIS — M1611 Unilateral primary osteoarthritis, right hip: Secondary | ICD-10-CM | POA: Diagnosis not present

## 2018-02-12 DIAGNOSIS — Z96641 Presence of right artificial hip joint: Secondary | ICD-10-CM | POA: Diagnosis not present

## 2018-02-12 DIAGNOSIS — Z471 Aftercare following joint replacement surgery: Secondary | ICD-10-CM | POA: Diagnosis not present

## 2018-02-12 DIAGNOSIS — D751 Secondary polycythemia: Secondary | ICD-10-CM | POA: Diagnosis not present

## 2018-02-12 DIAGNOSIS — Z743 Need for continuous supervision: Secondary | ICD-10-CM | POA: Diagnosis not present

## 2018-02-12 DIAGNOSIS — R262 Difficulty in walking, not elsewhere classified: Secondary | ICD-10-CM | POA: Diagnosis not present

## 2018-02-12 DIAGNOSIS — R279 Unspecified lack of coordination: Secondary | ICD-10-CM | POA: Diagnosis not present

## 2018-02-12 DIAGNOSIS — R41841 Cognitive communication deficit: Secondary | ICD-10-CM | POA: Diagnosis not present

## 2018-02-12 DIAGNOSIS — D45 Polycythemia vera: Secondary | ICD-10-CM | POA: Diagnosis not present

## 2018-02-12 DIAGNOSIS — T84018A Broken internal joint prosthesis, other site, initial encounter: Secondary | ICD-10-CM | POA: Diagnosis not present

## 2018-02-12 DIAGNOSIS — H4010X0 Unspecified open-angle glaucoma, stage unspecified: Secondary | ICD-10-CM | POA: Diagnosis not present

## 2018-02-12 LAB — CBC WITH DIFFERENTIAL/PLATELET
BASOS ABS: 0.5 10*3/uL — AB (ref 0.0–0.1)
Basophils Relative: 2 %
Eosinophils Absolute: 1 10*3/uL — ABNORMAL HIGH (ref 0.0–0.7)
Eosinophils Relative: 4 %
HEMATOCRIT: 28.8 % — AB (ref 36.0–46.0)
HEMOGLOBIN: 8.7 g/dL — AB (ref 12.0–15.0)
LYMPHS ABS: 1 10*3/uL (ref 0.7–4.0)
LYMPHS PCT: 4 %
MCH: 25.1 pg — ABNORMAL LOW (ref 26.0–34.0)
MCHC: 30.2 g/dL (ref 30.0–36.0)
MCV: 83.2 fL (ref 78.0–100.0)
MONOS PCT: 4 %
Monocytes Absolute: 1 10*3/uL (ref 0.1–1.0)
NEUTROS PCT: 86 %
Neutro Abs: 20.9 10*3/uL — ABNORMAL HIGH (ref 1.7–7.7)
Platelets: 1189 10*3/uL (ref 150–400)
RBC: 3.46 MIL/uL — AB (ref 3.87–5.11)
RDW: 27.3 % — AB (ref 11.5–15.5)
WBC: 24.4 10*3/uL — ABNORMAL HIGH (ref 4.0–10.5)

## 2018-02-12 NOTE — Progress Notes (Signed)
Physical Therapy Treatment Patient Details Name: Alejandra [name] MRN: [id number] DOB: 06/12/1941 Today's Date: 02/12/2018    History of Present Illness RIGHT TOTAL HIP REVISION: REVISION OF RIGHT MONOPOLAR/CONVERSION TO TOTAL HIP.h/o polycythemia vera, thrombocytosis, htn, bipolar hemiarthroplasy 2013, vertigo    PT Comments    POD# 8 Assisted OOB to amb a greater distance still required + 2 assist.  Pt will need ST Rehab at SNF   Follow Up Recommendations  Follow surgeon's recommendation for DC plan and follow-up therapies     Equipment Recommendations  None recommended by PT    Recommendations for Other Services       Precautions / Restrictions Precautions Precautions: Fall;Posterior Hip Precaution Comments: pt recalled 0/3 THP; THP reviewed Restrictions Weight Bearing Restrictions: No Other Position/Activity Restrictions: WBAT    Mobility  Bed Mobility Overal bed mobility: Needs Assistance Bed Mobility: Supine to Sit     Supine to sit: Min assist     General bed mobility comments: incr time needed, multi-modal cues for technique and hip precautions  Transfers Overall transfer level: Needs assistance Equipment used: Rolling walker (2 wheeled) Transfers: Sit to/from Stand Sit to Stand: Mod assist;+2 physical assistance Stand pivot transfers: Mod assist;Max assist       General transfer comment: bil underarm physical assist for sit to stand   mild tremors present   Ambulation/Gait Ambulation/Gait assistance: Min assist;+2 physical assistance Gait Distance (Feet): 28 Feet Assistive device: Rolling walker (2 wheeled) Gait Pattern/deviations: Step-to pattern;Decreased step length - right;Decreased step length - left;Decreased stance time - right;Shuffle;Antalgic;Wide base of support     General Gait Details: bil underarm physcial assist to maintain standing posture  25% VC's on proper walker to self distance    Stairs             Wheelchair Mobility    Modified Rankin (Stroke Patients Only)       Balance                                            Cognition Arousal/Alertness: Awake/alert Behavior During Therapy: WFL for tasks assessed/performed Overall Cognitive Status: Within Functional Limits for tasks assessed                                        Exercises      General Comments        Pertinent Vitals/Pain Pain Assessment: 0-10 Pain Score: 3  Pain Location: R hip Pain Descriptors / Indicators: Aching;Sore Pain Intervention(s): Monitored during session;Repositioned    Home Living                      Prior Function            PT Goals (current goals can now be found in the care plan section) Progress towards PT goals: Progressing toward goals    Frequency    7X/week      PT Plan Current plan remains appropriate    Co-evaluation              AM-PAC PT "6 Clicks" Daily Activity  Outcome Measure  Difficulty turning over in bed (including adjusting bedclothes, sheets and blankets)?: A Lot Difficulty moving from lying on back to sitting on the side of the bed? : A  Lot Difficulty sitting down on and standing up from a chair with arms (e.g., wheelchair, bedside commode, etc,.)?: A Lot Help needed moving to and from a bed to chair (including a wheelchair)?: A Lot Help needed walking in hospital room?: A Lot Help needed climbing 3-5 steps with a railing? : Total 6 Click Score: 11    End of Session Equipment Utilized During Treatment: Gait belt Activity Tolerance: Patient tolerated treatment well;Other (comment) Patient left: in chair;with call bell/phone within reach Nurse Communication: Mobility status(no incisional bleeding today) PT Visit Diagnosis: Other abnormalities of gait and mobility (R26.89);Muscle weakness (generalized) (M62.81);Difficulty in walking, not elsewhere classified (R26.2)     Time: 7672-0947 PT Time Calculation (min) (ACUTE  ONLY): 28 min  Charges:  $Gait Training: 8-22 mins $Therapeutic Activity: 8-22 mins                    G Codes:       Rica Koyanagi  PTA [facility]  Acute  Rehab Pager      [phone]

## 2018-02-12 NOTE — Progress Notes (Signed)
Critical plat of 1189. Dr. Hal Morales notified in person. No new orders received

## 2018-02-12 NOTE — Progress Notes (Signed)
Dressing changed, moderate blood when dressing removed, otherwise minimal blood on dressing overnight. Vitals stable.

## 2018-02-12 NOTE — [place] Notes (Signed)
[name]  MRN: [id number]  DOB/AGE:  [birth year] / 77 y.o.  8 Days Post-Op Procedure(s) (LRB): RIGHT TOTAL HIP REVISION: REVISION OF RIGHT MONOPOLAR/CONVERSION TO TOTAL HIP (Right)    PROGRESS NOTE Subjective: Patient is alert, oriented, no Nausea, no Vomiting, yes passing gas, . Taking PO well.Does not feel fatigued Denies SOB, Chest or Calf Pain. Using Incentive Spirometer, PAS in place. Ambulate 69' Patient reports pain as  1/10  .    Objective: Vital signs in last 24 hours: Vitals:   02/11/18 0530 02/11/18 1430 02/11/18 1948 02/12/18 0601  BP: 124/72 132/65 129/73 (Abnormal) 143/87  Pulse: 82 84 89 78  Resp: 15 18 16 16   Temp: 98.7 F (37.1 C) 99.9 F (37.7 C) 100.2 F (37.9 C) 98.5 F (36.9 C)  TempSrc:  Oral Oral Oral  SpO2: 100% 100% 90% 100%  Weight:      Height:          Intake/Output from previous day: I/O last 3 completed shifts: In: 720 [P.O.:720] Out: 2000 [Urine:2000]   Intake/Output this shift: No intake/output data recorded.   LABORATORY DATA: Recent Labs    02/11/18 0655 02/12/18 0629  WBC  --  24.4*  HGB 8.3* 8.7*  HCT 26.9* 28.8*  PLT  --  1,189*    Examination: Neurologically intact ABD soft Neurovascular intact Sensation intact distally Intact pulses distally Dorsiflexion/Plantar flexion intact Incision: scant drainage No cellulitis present Compartment soft} Dressing change this morning had minimal drainage.  New dressing was applied. XR AP&Lat of hip shows well placed\fixed THA  Assessment:   8 Days Post-Op Procedure(s) (LRB): RIGHT TOTAL HIP REVISION: REVISION OF RIGHT MONOPOLAR/CONVERSION TO TOTAL HIP (Right) ADDITIONAL DIAGNOSIS:  Expected Acute Blood Loss Anemia, Polycythemia vera triphasic,Hypertension, vertigo.  Plan: PT/OT WBAT, Posterior hip precautions.  DVT Prophylaxis: SCDx72 hrs, Chemical prophylaxis was discontinued secondary to bleeding that is required 4 units of packed red blood cells in the first  week after surgery secondary to her triphasic polycythemia vera  DISCHARGE PLAN: Skilled Nursing Facility/Rehab  DISCHARGE NEEDS: HHPT, Walker and 3-in-1 comode seat

## 2018-02-13 DIAGNOSIS — Z96641 Presence of right artificial hip joint: Secondary | ICD-10-CM | POA: Diagnosis not present

## 2018-02-13 DIAGNOSIS — M1990 Unspecified osteoarthritis, unspecified site: Secondary | ICD-10-CM | POA: Diagnosis not present

## 2018-02-13 DIAGNOSIS — D45 Polycythemia vera: Secondary | ICD-10-CM | POA: Diagnosis not present

## 2018-02-13 DIAGNOSIS — M25551 Pain in right hip: Secondary | ICD-10-CM | POA: Diagnosis not present

## 2018-02-13 DIAGNOSIS — I10 Essential (primary) hypertension: Secondary | ICD-10-CM | POA: Diagnosis not present

## 2018-02-15 DIAGNOSIS — M25551 Pain in right hip: Secondary | ICD-10-CM | POA: Diagnosis not present

## 2018-02-19 DIAGNOSIS — M25551 Pain in right hip: Secondary | ICD-10-CM | POA: Diagnosis not present

## 2018-03-11 DIAGNOSIS — Z471 Aftercare following joint replacement surgery: Secondary | ICD-10-CM | POA: Diagnosis not present

## 2018-03-11 DIAGNOSIS — M1611 Unilateral primary osteoarthritis, right hip: Secondary | ICD-10-CM | POA: Diagnosis not present

## 2018-03-11 DIAGNOSIS — R262 Difficulty in walking, not elsewhere classified: Secondary | ICD-10-CM | POA: Diagnosis not present

## 2018-03-11 DIAGNOSIS — R41841 Cognitive communication deficit: Secondary | ICD-10-CM | POA: Diagnosis not present

## 2018-03-11 DIAGNOSIS — Z96641 Presence of right artificial hip joint: Secondary | ICD-10-CM | POA: Diagnosis not present

## 2018-03-11 DIAGNOSIS — R293 Abnormal posture: Secondary | ICD-10-CM | POA: Diagnosis not present

## 2018-03-12 DIAGNOSIS — R293 Abnormal posture: Secondary | ICD-10-CM | POA: Diagnosis not present

## 2018-03-12 DIAGNOSIS — Z96641 Presence of right artificial hip joint: Secondary | ICD-10-CM | POA: Diagnosis not present

## 2018-03-12 DIAGNOSIS — M1611 Unilateral primary osteoarthritis, right hip: Secondary | ICD-10-CM | POA: Diagnosis not present

## 2018-03-12 DIAGNOSIS — R41841 Cognitive communication deficit: Secondary | ICD-10-CM | POA: Diagnosis not present

## 2018-03-12 DIAGNOSIS — Z471 Aftercare following joint replacement surgery: Secondary | ICD-10-CM | POA: Diagnosis not present

## 2018-03-12 DIAGNOSIS — R262 Difficulty in walking, not elsewhere classified: Secondary | ICD-10-CM | POA: Diagnosis not present

## 2018-03-13 DIAGNOSIS — Z471 Aftercare following joint replacement surgery: Secondary | ICD-10-CM | POA: Diagnosis not present

## 2018-03-13 DIAGNOSIS — R262 Difficulty in walking, not elsewhere classified: Secondary | ICD-10-CM | POA: Diagnosis not present

## 2018-03-13 DIAGNOSIS — Z96641 Presence of right artificial hip joint: Secondary | ICD-10-CM | POA: Diagnosis not present

## 2018-03-13 DIAGNOSIS — M1611 Unilateral primary osteoarthritis, right hip: Secondary | ICD-10-CM | POA: Diagnosis not present

## 2018-03-13 DIAGNOSIS — R41841 Cognitive communication deficit: Secondary | ICD-10-CM | POA: Diagnosis not present

## 2018-03-13 DIAGNOSIS — R293 Abnormal posture: Secondary | ICD-10-CM | POA: Diagnosis not present

## 2018-03-14 DIAGNOSIS — M1611 Unilateral primary osteoarthritis, right hip: Secondary | ICD-10-CM | POA: Diagnosis not present

## 2018-03-14 DIAGNOSIS — Z96641 Presence of right artificial hip joint: Secondary | ICD-10-CM | POA: Diagnosis not present

## 2018-03-14 DIAGNOSIS — R41841 Cognitive communication deficit: Secondary | ICD-10-CM | POA: Diagnosis not present

## 2018-03-14 DIAGNOSIS — R262 Difficulty in walking, not elsewhere classified: Secondary | ICD-10-CM | POA: Diagnosis not present

## 2018-03-14 DIAGNOSIS — R293 Abnormal posture: Secondary | ICD-10-CM | POA: Diagnosis not present

## 2018-03-14 DIAGNOSIS — Z471 Aftercare following joint replacement surgery: Secondary | ICD-10-CM | POA: Diagnosis not present

## 2018-03-15 DIAGNOSIS — R262 Difficulty in walking, not elsewhere classified: Secondary | ICD-10-CM | POA: Diagnosis not present

## 2018-03-15 DIAGNOSIS — R41841 Cognitive communication deficit: Secondary | ICD-10-CM | POA: Diagnosis not present

## 2018-03-15 DIAGNOSIS — Z96641 Presence of right artificial hip joint: Secondary | ICD-10-CM | POA: Diagnosis not present

## 2018-03-15 DIAGNOSIS — M1611 Unilateral primary osteoarthritis, right hip: Secondary | ICD-10-CM | POA: Diagnosis not present

## 2018-03-15 DIAGNOSIS — R293 Abnormal posture: Secondary | ICD-10-CM | POA: Diagnosis not present

## 2018-03-15 DIAGNOSIS — Z471 Aftercare following joint replacement surgery: Secondary | ICD-10-CM | POA: Diagnosis not present

## 2018-03-16 DIAGNOSIS — Z96641 Presence of right artificial hip joint: Secondary | ICD-10-CM | POA: Diagnosis not present

## 2018-03-16 DIAGNOSIS — M1611 Unilateral primary osteoarthritis, right hip: Secondary | ICD-10-CM | POA: Diagnosis not present

## 2018-03-16 DIAGNOSIS — R41841 Cognitive communication deficit: Secondary | ICD-10-CM | POA: Diagnosis not present

## 2018-03-16 DIAGNOSIS — Z471 Aftercare following joint replacement surgery: Secondary | ICD-10-CM | POA: Diagnosis not present

## 2018-03-16 DIAGNOSIS — R293 Abnormal posture: Secondary | ICD-10-CM | POA: Diagnosis not present

## 2018-03-16 DIAGNOSIS — R262 Difficulty in walking, not elsewhere classified: Secondary | ICD-10-CM | POA: Diagnosis not present

## 2018-03-18 DIAGNOSIS — Z471 Aftercare following joint replacement surgery: Secondary | ICD-10-CM | POA: Diagnosis not present

## 2018-03-18 DIAGNOSIS — R293 Abnormal posture: Secondary | ICD-10-CM | POA: Diagnosis not present

## 2018-03-18 DIAGNOSIS — R41841 Cognitive communication deficit: Secondary | ICD-10-CM | POA: Diagnosis not present

## 2018-03-18 DIAGNOSIS — R262 Difficulty in walking, not elsewhere classified: Secondary | ICD-10-CM | POA: Diagnosis not present

## 2018-03-18 DIAGNOSIS — M1611 Unilateral primary osteoarthritis, right hip: Secondary | ICD-10-CM | POA: Diagnosis not present

## 2018-03-18 DIAGNOSIS — Z96641 Presence of right artificial hip joint: Secondary | ICD-10-CM | POA: Diagnosis not present

## 2018-03-19 DIAGNOSIS — R41841 Cognitive communication deficit: Secondary | ICD-10-CM | POA: Diagnosis not present

## 2018-03-19 DIAGNOSIS — R293 Abnormal posture: Secondary | ICD-10-CM | POA: Diagnosis not present

## 2018-03-19 DIAGNOSIS — M1611 Unilateral primary osteoarthritis, right hip: Secondary | ICD-10-CM | POA: Diagnosis not present

## 2018-03-19 DIAGNOSIS — Z471 Aftercare following joint replacement surgery: Secondary | ICD-10-CM | POA: Diagnosis not present

## 2018-03-19 DIAGNOSIS — R262 Difficulty in walking, not elsewhere classified: Secondary | ICD-10-CM | POA: Diagnosis not present

## 2018-03-19 DIAGNOSIS — Z96641 Presence of right artificial hip joint: Secondary | ICD-10-CM | POA: Diagnosis not present

## 2018-03-25 DIAGNOSIS — H409 Unspecified glaucoma: Secondary | ICD-10-CM | POA: Diagnosis not present

## 2018-03-25 DIAGNOSIS — T84090D Other mechanical complication of internal right hip prosthesis, subsequent encounter: Secondary | ICD-10-CM | POA: Diagnosis not present

## 2018-03-25 DIAGNOSIS — M1990 Unspecified osteoarthritis, unspecified site: Secondary | ICD-10-CM | POA: Diagnosis not present

## 2018-03-25 DIAGNOSIS — I10 Essential (primary) hypertension: Secondary | ICD-10-CM | POA: Diagnosis not present

## 2018-03-25 DIAGNOSIS — D751 Secondary polycythemia: Secondary | ICD-10-CM | POA: Diagnosis not present

## 2018-03-26 DIAGNOSIS — Z79899 Other long term (current) drug therapy: Secondary | ICD-10-CM | POA: Diagnosis not present

## 2018-03-26 DIAGNOSIS — D45 Polycythemia vera: Secondary | ICD-10-CM | POA: Diagnosis not present

## 2018-03-26 DIAGNOSIS — N390 Urinary tract infection, site not specified: Secondary | ICD-10-CM | POA: Diagnosis not present

## 2018-03-26 DIAGNOSIS — Z79891 Long term (current) use of opiate analgesic: Secondary | ICD-10-CM | POA: Diagnosis not present

## 2018-03-26 DIAGNOSIS — S32501D Unspecified fracture of right pubis, subsequent encounter for fracture with routine healing: Secondary | ICD-10-CM | POA: Diagnosis not present

## 2018-03-26 DIAGNOSIS — N182 Chronic kidney disease, stage 2 (mild): Secondary | ICD-10-CM | POA: Diagnosis not present

## 2018-03-26 DIAGNOSIS — H4010X0 Unspecified open-angle glaucoma, stage unspecified: Secondary | ICD-10-CM | POA: Diagnosis not present

## 2018-03-26 DIAGNOSIS — M2550 Pain in unspecified joint: Secondary | ICD-10-CM | POA: Diagnosis not present

## 2018-03-26 DIAGNOSIS — Z8249 Family history of ischemic heart disease and other diseases of the circulatory system: Secondary | ICD-10-CM | POA: Diagnosis not present

## 2018-03-26 DIAGNOSIS — M159 Polyosteoarthritis, unspecified: Secondary | ICD-10-CM | POA: Diagnosis not present

## 2018-03-26 DIAGNOSIS — I119 Hypertensive heart disease without heart failure: Secondary | ICD-10-CM | POA: Diagnosis not present

## 2018-03-29 DIAGNOSIS — I10 Essential (primary) hypertension: Secondary | ICD-10-CM | POA: Diagnosis not present

## 2018-03-29 DIAGNOSIS — H409 Unspecified glaucoma: Secondary | ICD-10-CM | POA: Diagnosis not present

## 2018-03-29 DIAGNOSIS — T84090D Other mechanical complication of internal right hip prosthesis, subsequent encounter: Secondary | ICD-10-CM | POA: Diagnosis not present

## 2018-03-29 DIAGNOSIS — D751 Secondary polycythemia: Secondary | ICD-10-CM | POA: Diagnosis not present

## 2018-03-29 DIAGNOSIS — M1990 Unspecified osteoarthritis, unspecified site: Secondary | ICD-10-CM | POA: Diagnosis not present

## 2018-04-02 DIAGNOSIS — M1990 Unspecified osteoarthritis, unspecified site: Secondary | ICD-10-CM | POA: Diagnosis not present

## 2018-04-02 DIAGNOSIS — D751 Secondary polycythemia: Secondary | ICD-10-CM | POA: Diagnosis not present

## 2018-04-02 DIAGNOSIS — T84090D Other mechanical complication of internal right hip prosthesis, subsequent encounter: Secondary | ICD-10-CM | POA: Diagnosis not present

## 2018-04-02 DIAGNOSIS — H409 Unspecified glaucoma: Secondary | ICD-10-CM | POA: Diagnosis not present

## 2018-04-02 DIAGNOSIS — I10 Essential (primary) hypertension: Secondary | ICD-10-CM | POA: Diagnosis not present

## 2018-04-03 DIAGNOSIS — I10 Essential (primary) hypertension: Secondary | ICD-10-CM | POA: Diagnosis not present

## 2018-04-03 DIAGNOSIS — T84090D Other mechanical complication of internal right hip prosthesis, subsequent encounter: Secondary | ICD-10-CM | POA: Diagnosis not present

## 2018-04-03 DIAGNOSIS — H409 Unspecified glaucoma: Secondary | ICD-10-CM | POA: Diagnosis not present

## 2018-04-03 DIAGNOSIS — M1990 Unspecified osteoarthritis, unspecified site: Secondary | ICD-10-CM | POA: Diagnosis not present

## 2018-04-03 DIAGNOSIS — D751 Secondary polycythemia: Secondary | ICD-10-CM | POA: Diagnosis not present

## 2018-04-05 DIAGNOSIS — T84090D Other mechanical complication of internal right hip prosthesis, subsequent encounter: Secondary | ICD-10-CM | POA: Diagnosis not present

## 2018-04-05 DIAGNOSIS — D751 Secondary polycythemia: Secondary | ICD-10-CM | POA: Diagnosis not present

## 2018-04-05 DIAGNOSIS — H409 Unspecified glaucoma: Secondary | ICD-10-CM | POA: Diagnosis not present

## 2018-04-05 DIAGNOSIS — I10 Essential (primary) hypertension: Secondary | ICD-10-CM | POA: Diagnosis not present

## 2018-04-05 DIAGNOSIS — M1990 Unspecified osteoarthritis, unspecified site: Secondary | ICD-10-CM | POA: Diagnosis not present

## 2018-04-09 DIAGNOSIS — J309 Allergic rhinitis, unspecified: Secondary | ICD-10-CM | POA: Diagnosis not present

## 2018-04-09 DIAGNOSIS — R69 Illness, unspecified: Secondary | ICD-10-CM | POA: Diagnosis not present

## 2018-04-09 DIAGNOSIS — I129 Hypertensive chronic kidney disease with stage 1 through stage 4 chronic kidney disease, or unspecified chronic kidney disease: Secondary | ICD-10-CM | POA: Diagnosis not present

## 2018-04-09 DIAGNOSIS — M1990 Unspecified osteoarthritis, unspecified site: Secondary | ICD-10-CM | POA: Diagnosis not present

## 2018-04-09 DIAGNOSIS — D751 Secondary polycythemia: Secondary | ICD-10-CM | POA: Diagnosis not present

## 2018-04-09 DIAGNOSIS — E785 Hyperlipidemia, unspecified: Secondary | ICD-10-CM | POA: Diagnosis not present

## 2018-04-09 DIAGNOSIS — R42 Dizziness and giddiness: Secondary | ICD-10-CM | POA: Diagnosis not present

## 2018-04-09 DIAGNOSIS — T84090D Other mechanical complication of internal right hip prosthesis, subsequent encounter: Secondary | ICD-10-CM | POA: Diagnosis not present

## 2018-04-09 DIAGNOSIS — H409 Unspecified glaucoma: Secondary | ICD-10-CM | POA: Diagnosis not present

## 2018-04-09 DIAGNOSIS — G8929 Other chronic pain: Secondary | ICD-10-CM | POA: Diagnosis not present

## 2018-04-09 DIAGNOSIS — N189 Chronic kidney disease, unspecified: Secondary | ICD-10-CM | POA: Diagnosis not present

## 2018-04-09 DIAGNOSIS — Z791 Long term (current) use of non-steroidal anti-inflammatories (NSAID): Secondary | ICD-10-CM | POA: Diagnosis not present

## 2018-04-09 DIAGNOSIS — I10 Essential (primary) hypertension: Secondary | ICD-10-CM | POA: Diagnosis not present

## 2018-04-11 DIAGNOSIS — M25551 Pain in right hip: Secondary | ICD-10-CM | POA: Diagnosis not present

## 2018-04-12 ENCOUNTER — Encounter (HOSPITAL_COMMUNITY): Payer: Self-pay

## 2018-04-12 ENCOUNTER — Inpatient Hospital Stay (HOSPITAL_COMMUNITY): Payer: Medicare HMO

## 2018-04-12 ENCOUNTER — Emergency Department (HOSPITAL_COMMUNITY): Payer: Medicare HMO

## 2018-04-12 ENCOUNTER — Other Ambulatory Visit: Payer: Self-pay

## 2018-04-12 ENCOUNTER — Inpatient Hospital Stay (HOSPITAL_COMMUNITY)
Admission: EM | Admit: 2018-04-12 | Discharge: 2018-04-17 | DRG: 605 | Disposition: A | Discharge status: Skilled Nursing Facility | Payer: Medicare HMO | Attending: Internal Medicine | Admitting: Internal Medicine

## 2018-04-12 DIAGNOSIS — N390 Urinary tract infection, site not specified: Secondary | ICD-10-CM | POA: Diagnosis not present

## 2018-04-12 DIAGNOSIS — R319 Hematuria, unspecified: Secondary | ICD-10-CM | POA: Diagnosis not present

## 2018-04-12 DIAGNOSIS — R748 Abnormal levels of other serum enzymes: Secondary | ICD-10-CM | POA: Diagnosis not present

## 2018-04-12 DIAGNOSIS — S79911A Unspecified injury of right hip, initial encounter: Secondary | ICD-10-CM | POA: Diagnosis not present

## 2018-04-12 DIAGNOSIS — D500 Iron deficiency anemia secondary to blood loss (chronic): Secondary | ICD-10-CM | POA: Diagnosis present

## 2018-04-12 DIAGNOSIS — R778 Other specified abnormalities of plasma proteins: Secondary | ICD-10-CM

## 2018-04-12 DIAGNOSIS — D473 Essential (hemorrhagic) thrombocythemia: Secondary | ICD-10-CM | POA: Diagnosis not present

## 2018-04-12 DIAGNOSIS — H409 Unspecified glaucoma: Secondary | ICD-10-CM | POA: Diagnosis present

## 2018-04-12 DIAGNOSIS — Z79899 Other long term (current) drug therapy: Secondary | ICD-10-CM

## 2018-04-12 DIAGNOSIS — E860 Dehydration: Secondary | ICD-10-CM | POA: Diagnosis present

## 2018-04-12 DIAGNOSIS — M25551 Pain in right hip: Secondary | ICD-10-CM

## 2018-04-12 DIAGNOSIS — N179 Acute kidney failure, unspecified: Secondary | ICD-10-CM | POA: Diagnosis not present

## 2018-04-12 DIAGNOSIS — Z79891 Long term (current) use of opiate analgesic: Secondary | ICD-10-CM

## 2018-04-12 DIAGNOSIS — Z96641 Presence of right artificial hip joint: Secondary | ICD-10-CM | POA: Diagnosis present

## 2018-04-12 DIAGNOSIS — W19XXXA Unspecified fall, initial encounter: Secondary | ICD-10-CM

## 2018-04-12 DIAGNOSIS — I361 Nonrheumatic tricuspid (valve) insufficiency: Secondary | ICD-10-CM | POA: Diagnosis not present

## 2018-04-12 DIAGNOSIS — D45 Polycythemia vera: Secondary | ICD-10-CM | POA: Diagnosis present

## 2018-04-12 DIAGNOSIS — R58 Hemorrhage, not elsewhere classified: Secondary | ICD-10-CM | POA: Diagnosis not present

## 2018-04-12 DIAGNOSIS — Z7982 Long term (current) use of aspirin: Secondary | ICD-10-CM | POA: Diagnosis not present

## 2018-04-12 DIAGNOSIS — Z743 Need for continuous supervision: Secondary | ICD-10-CM | POA: Diagnosis not present

## 2018-04-12 DIAGNOSIS — S7001XA Contusion of right hip, initial encounter: Secondary | ICD-10-CM | POA: Diagnosis present

## 2018-04-12 DIAGNOSIS — R7989 Other specified abnormal findings of blood chemistry: Secondary | ICD-10-CM

## 2018-04-12 DIAGNOSIS — R42 Dizziness and giddiness: Secondary | ICD-10-CM | POA: Diagnosis not present

## 2018-04-12 DIAGNOSIS — W06XXXA Fall from bed, initial encounter: Secondary | ICD-10-CM | POA: Diagnosis not present

## 2018-04-12 DIAGNOSIS — D689 Coagulation defect, unspecified: Secondary | ICD-10-CM | POA: Diagnosis present

## 2018-04-12 DIAGNOSIS — Z87891 Personal history of nicotine dependence: Secondary | ICD-10-CM

## 2018-04-12 DIAGNOSIS — Y92009 Unspecified place in unspecified non-institutional (private) residence as the place of occurrence of the external cause: Secondary | ICD-10-CM | POA: Diagnosis not present

## 2018-04-12 DIAGNOSIS — Z825 Family history of asthma and other chronic lower respiratory diseases: Secondary | ICD-10-CM

## 2018-04-12 DIAGNOSIS — E785 Hyperlipidemia, unspecified: Secondary | ICD-10-CM | POA: Diagnosis not present

## 2018-04-12 DIAGNOSIS — I10 Essential (primary) hypertension: Secondary | ICD-10-CM

## 2018-04-12 DIAGNOSIS — A419 Sepsis, unspecified organism: Secondary | ICD-10-CM | POA: Diagnosis present

## 2018-04-12 DIAGNOSIS — M6281 Muscle weakness (generalized): Secondary | ICD-10-CM | POA: Diagnosis not present

## 2018-04-12 DIAGNOSIS — R0602 Shortness of breath: Secondary | ICD-10-CM | POA: Diagnosis not present

## 2018-04-12 DIAGNOSIS — M1990 Unspecified osteoarthritis, unspecified site: Secondary | ICD-10-CM | POA: Diagnosis present

## 2018-04-12 DIAGNOSIS — I959 Hypotension, unspecified: Secondary | ICD-10-CM | POA: Diagnosis not present

## 2018-04-12 DIAGNOSIS — R279 Unspecified lack of coordination: Secondary | ICD-10-CM | POA: Diagnosis not present

## 2018-04-12 DIAGNOSIS — Z8249 Family history of ischemic heart disease and other diseases of the circulatory system: Secondary | ICD-10-CM

## 2018-04-12 DIAGNOSIS — R262 Difficulty in walking, not elsewhere classified: Secondary | ICD-10-CM | POA: Diagnosis not present

## 2018-04-12 DIAGNOSIS — W19XXXD Unspecified fall, subsequent encounter: Secondary | ICD-10-CM | POA: Diagnosis not present

## 2018-04-12 LAB — CBC WITH DIFFERENTIAL/PLATELET
Basophils Absolute: 0.3 10*3/uL (ref 0.0–0.1)
Basophils Relative: 1 %
Eosinophils Absolute: 0 10*3/uL (ref 0.0–0.7)
Eosinophils Relative: 0 %
HEMATOCRIT: 29 % — AB (ref 36.0–46.0)
Hemoglobin: 8.5 g/dL — ABNORMAL LOW (ref 12.0–15.0)
LYMPHS ABS: 0.8 10*3/uL (ref 0.7–4.0)
LYMPHS PCT: 2 %
MCH: 21.9 pg — ABNORMAL LOW (ref 26.0–34.0)
MCHC: 29.3 g/dL — ABNORMAL LOW (ref 30.0–36.0)
MCV: 74.6 fL — AB (ref 78.0–100.0)
Monocytes Absolute: 2.3 10*3/uL (ref 0.1–1.0)
Monocytes Relative: 5 %
NEUTROS PCT: 92 %
Neutro Abs: 43.3 10*3/uL (ref 1.7–7.7)
Platelets: 2234 10*3/uL (ref 150–400)
RBC: 3.89 MIL/uL (ref 3.87–5.11)
RDW: 27.3 % — ABNORMAL HIGH (ref 11.5–15.5)
WBC: 46.7 10*3/uL — ABNORMAL HIGH (ref 4.0–10.5)

## 2018-04-12 LAB — COMPREHENSIVE METABOLIC PANEL
ALK PHOS: 104 U/L (ref 38–126)
ALT: 13 U/L (ref 0–44)
AST: 21 U/L (ref 15–41)
Albumin: 3.1 g/dL — ABNORMAL LOW (ref 3.5–5.0)
Anion gap: 13 (ref 5–15)
BILIRUBIN TOTAL: 0.8 mg/dL (ref 0.3–1.2)
BUN: 38 mg/dL — AB (ref 8–23)
CALCIUM: 8.9 mg/dL (ref 8.9–10.3)
CO2: 24 mmol/L (ref 22–32)
CREATININE: 2.48 mg/dL — AB (ref 0.44–1.00)
Chloride: 103 mmol/L (ref 98–111)
GFR calc Af Amer: 20 mL/min — ABNORMAL LOW (ref >60)
GFR, EST NON AFRICAN AMERICAN: 18 mL/min — AB (ref >60)
Glucose, Bld: 139 mg/dL — ABNORMAL HIGH (ref 70–99)
Potassium: 4.7 mmol/L (ref 3.5–5.1)
Sodium: 140 mmol/L (ref 135–145)
TOTAL PROTEIN: 7 g/dL (ref 6.5–8.1)

## 2018-04-12 LAB — URINALYSIS, ROUTINE W REFLEX MICROSCOPIC
Bilirubin Urine: NEGATIVE
GLUCOSE, UA: NEGATIVE mg/dL
HGB URINE DIPSTICK: NEGATIVE
KETONES UR: NEGATIVE mg/dL
Nitrite: NEGATIVE
PH: 5 (ref 5.0–8.0)
PROTEIN: 30 mg/dL — AB
Specific Gravity, Urine: 1.02 (ref 1.005–1.030)

## 2018-04-12 LAB — ECHOCARDIOGRAM COMPLETE
HEIGHTINCHES: 64 in
WEIGHTICAEL: 1808 [oz_av]

## 2018-04-12 LAB — TROPONIN I
TROPONIN I: 0.08 ng/mL — AB (ref <0.03)
TROPONIN I: 0.1 ng/mL — AB (ref <0.03)
Troponin I: 0.08 ng/mL (ref <0.03)

## 2018-04-12 LAB — I-STAT CG4 LACTIC ACID, ED
LACTIC ACID, VENOUS: 1.36 mmol/L (ref 0.5–1.9)
LACTIC ACID, VENOUS: 0.91 mmol/L (ref 0.5–1.9)

## 2018-04-12 LAB — MRSA PCR SCREENING: MRSA by PCR: NEGATIVE

## 2018-04-12 LAB — CK: Total CK: 91 U/L (ref 38–234)

## 2018-04-12 MED ORDER — SODIUM CHLORIDE 0.9 % IV SOLN
INTRAVENOUS | Status: DC
  Administered 2018-04-12 – 2018-04-15 (×8): via INTRAVENOUS

## 2018-04-12 MED ORDER — TIZANIDINE HCL 4 MG PO TABS
2.0000 mg | ORAL_TABLET | Freq: Four times a day (QID) | ORAL | Status: DC | PRN
  Administered 2018-04-13: 2 mg via ORAL
  Filled 2018-04-12: qty 1

## 2018-04-12 MED ORDER — ONDANSETRON HCL 4 MG PO TABS: 4.0000 mg | ORAL_TABLET | Freq: Four times a day (QID) | ORAL | Status: DC | PRN

## 2018-04-12 MED ORDER — SODIUM CHLORIDE 0.9 % IV SOLN
1.0000 g | INTRAVENOUS | Status: DC
  Administered 2018-04-13: 1 g via INTRAVENOUS
  Filled 2018-04-12: qty 1
  Filled 2018-04-12: qty 10

## 2018-04-12 MED ORDER — HEPARIN SODIUM (PORCINE) 5000 UNIT/ML IJ SOLN: 5000.0000 [IU] | Freq: Three times a day (TID) | INTRAMUSCULAR | Status: DC

## 2018-04-12 MED ORDER — SODIUM CHLORIDE 0.9 % IV SOLN
2.0000 g | Freq: Once | INTRAVENOUS | Status: AC
  Administered 2018-04-12: 2 g via INTRAVENOUS
  Filled 2018-04-12: qty 20

## 2018-04-12 MED ORDER — ACETAMINOPHEN 325 MG PO TABS
650.0000 mg | ORAL_TABLET | Freq: Once | ORAL | Status: AC
  Administered 2018-04-12: 650 mg via ORAL
  Filled 2018-04-12: qty 2

## 2018-04-12 MED ORDER — ACETAMINOPHEN 325 MG PO TABS
650.0000 mg | ORAL_TABLET | Freq: Four times a day (QID) | ORAL | Status: DC | PRN
  Administered 2018-04-13 – 2018-04-17 (×8): 650 mg via ORAL
  Filled 2018-04-12 (×9): qty 2

## 2018-04-12 MED ORDER — LATANOPROST 0.005 % OP SOLN
1.0000 [drp] | Freq: Every day | OPHTHALMIC | Status: DC
  Administered 2018-04-12 – 2018-04-16 (×5): 1 [drp] via OPHTHALMIC
  Filled 2018-04-12: qty 2.5

## 2018-04-12 MED ORDER — HYDROXYUREA 500 MG PO CAPS: 500.0000 mg | ORAL_CAPSULE | ORAL | Status: DC

## 2018-04-12 MED ORDER — SODIUM CHLORIDE 0.9 % IV BOLUS
1000.0000 mL | Freq: Once | INTRAVENOUS | Status: AC
  Administered 2018-04-12: 1000 mL via INTRAVENOUS

## 2018-04-12 MED ORDER — MECLIZINE HCL 25 MG PO TABS: 25.0000 mg | ORAL_TABLET | Freq: Three times a day (TID) | ORAL | Status: DC | PRN

## 2018-04-12 MED ORDER — ASPIRIN 81 MG PO CHEW: 81.0000 mg | CHEWABLE_TABLET | Freq: Every day | ORAL | Status: DC

## 2018-04-12 MED ORDER — HYDROXYUREA 500 MG PO CAPS
500.0000 mg | ORAL_CAPSULE | ORAL | Status: DC
  Administered 2018-04-12: 500 mg via ORAL
  Filled 2018-04-12: qty 1

## 2018-04-12 MED ORDER — ALPRAZOLAM 0.25 MG PO TABS
0.2500 mg | ORAL_TABLET | Freq: Two times a day (BID) | ORAL | Status: DC | PRN
  Administered 2018-04-14 (×2): 0.25 mg via ORAL
  Filled 2018-04-12 (×2): qty 1

## 2018-04-12 MED ORDER — LORATADINE 10 MG PO TABS: 10.0000 mg | ORAL_TABLET | Freq: Every day | ORAL | Status: DC | PRN

## 2018-04-12 MED ORDER — HYDROXYUREA 500 MG PO CAPS
1000.0000 mg | ORAL_CAPSULE | ORAL | Status: DC
  Administered 2018-04-13: 1000 mg via ORAL
  Filled 2018-04-12: qty 2

## 2018-04-12 MED ORDER — ACETAMINOPHEN 650 MG RE SUPP: 650.0000 mg | Freq: Four times a day (QID) | RECTAL | Status: DC | PRN

## 2018-04-12 MED ORDER — ONDANSETRON HCL 4 MG/2ML IJ SOLN: 4.0000 mg | Freq: Four times a day (QID) | INTRAMUSCULAR | Status: DC | PRN

## 2018-04-12 MED ORDER — LATANOPROST 0.005 % OP SOLN: 1.0000 [drp] | Freq: Every day | OPHTHALMIC | Status: DC

## 2018-04-12 MED ORDER — OXYCODONE-ACETAMINOPHEN 5-325 MG PO TABS
1.0000 | ORAL_TABLET | ORAL | Status: DC | PRN
  Administered 2018-04-14 – 2018-04-16 (×4): 1 via ORAL
  Filled 2018-04-12 (×4): qty 1

## 2018-04-12 NOTE — Consult Note (Signed)
Reason for Consult:Painful right revision total hip Referring Physician: Dr. Pattricia Boss  Alejandra [name] is an 77 y.o. female.  HPI: Patient had revision right total up arthroplasty by me on 02/04/18.  Her index procedure was done in the year 2013 by Dr. Mardelle Matte and unfortunately that arthroplasty came loose over the last year.  She had a cemented stem that we were able to extract proximally without windowing the femur.  She had placement of an S-ROM stem and DePuy Pinnacle shell.  Her surgery was complicated by her long history of polycythemia vera that involved elevated hemoglobins requiring periodic phlebotomy, chronic white counts of 30-40,000, and platelet counts are commonly in the 1.5 million range.  Prior to revision.  We did consult Dr. Benay Spice of hematology to discuss anticoagulation as well as the ability of the patient's blood clot.  Commonly people with elevated platelet counts have more of a clotting problem and the bleeding problem and because of this, we initially withheld her tranexamic acidthat during her revision.1 hour into her revision.  The patient had continuous oozing and she was given the trans-Amick acid intraoperatively as well as a dosing.  4 hours after surgery.  We also withheld her aspirin after surgery.  Despite these maneuvers, the patient did have some significant bleeding and oozing and required 6 units of packed red blood cells during the postoperative period.  Prior to her discharge.  On 02/12/18.  When she was seen in the office a week later she was still having some oozing from the wound but that dried up.  By the time we saw her at the 2 week mark.  She was also ambulating well by then.  She was also evaluated for in 6 weeks after surgery and was doing well.  4 days prior to this admission the patient had what she described as spontaneous swelling over the right hip.  No fevers or chills and she did not feel sick.  She presented to our office yesterday and we aspirated 220 cc  of dark bloody material.  The Gram stain showed many white cells and no organisms.  The cultures are negative at 24 hours and the cell count on the fluid that was removed was 30,000 white cells, 90% polys.  Unfortunately, the patient's blood dyscrasia makes this hard to interpret.  In the office.  She was also noted to have a temperature of 99.0 and did not report any fevers.  At this time.  Her working diagnosis is a postoperative hematoma, we are awaiting the final results of her cultures.Sometime yesterday evening.  The patient rolled out of bed onto her right hip was unable to get up and found there by her sister this morning.  She was transported the [place] long emergency room where x-rays showed that her revision hip was intact.  She did have evidence of a UTI and an elevated creatinine of 2.4.  Past Medical History:  Diagnosis Date  . Arthritis   . Avascular necrosis of femur head, right ([place]) 04/23/2012  . Cellulitis of shoulder 09/28/2014  . Fracture 10/05/11   "fell and broke right hip"  . Fracture of femoral neck, right ([place]) 10/12/2011  . Glaucoma   . Glaucoma    bilateral  . Hypertension   . Polycythemia vera(238.4)   . Rhabdomyolysis 09/28/2014  . Thrombocytosis ([place]) 04/23/2012    Past Surgical History:  Procedure Laterality Date  . COLONOSCOPY    . HARDWARE REMOVAL  04/23/2012   Procedure: HARDWARE REMOVAL;  Surgeon: Johnny Bridge, MD;  Location: [facility];  Service: Orthopedics;  Laterality: Right;  . HIP ARTHROPLASTY  04/23/2012   Procedure: ARTHROPLASTY BIPOLAR HIP;  Surgeon: Johnny Bridge, MD;  Location: [facility];  Service: Orthopedics;  Laterality: Right;  . HIP PINNING,CANNULATED  10/12/2011   Procedure: CANNULATED HIP PINNING;  Surgeon: Johnny Bridge, MD;  Location: [facility];  Service: Orthopedics;  Laterality: Right;  . TOTAL HIP REVISION Right 02/04/2018   Procedure: RIGHT TOTAL HIP REVISION: REVISION OF RIGHT MONOPOLAR/CONVERSION TO TOTAL HIP;  Surgeon: Frederik Pear, MD;   Location: [facility];  Service: Orthopedics;  Laterality: Right;  Requesting 2 hours    Family History  Problem Relation Age of Onset  . Cancer Mother   . Hypertension Father   . Asthma Sister     Social History:  reports that she quit smoking about 26 years ago. Her smoking use included cigarettes. She smoked 0.12 packs per day. She has never used smokeless tobacco. She reports that she does not drink alcohol or use drugs.  Allergies: No Known Allergies  Medications: I have reviewed the patient's current medications.  Results for orders placed or performed during the hospital encounter of 04/12/18 (from the past 48 hour(s))  CBC with Differential/Platelet     Status: Abnormal   Collection Time: 04/12/18 10:32 AM  Result Value Ref Range   WBC 46.7 (H) 4.0 - 10.5 K/uL    Comment: WHITE COUNT CONFIRMED ON SMEAR   RBC 3.89 3.87 - 5.11 MIL/uL   Hemoglobin 8.5 (L) 12.0 - 15.0 g/dL   HCT 29.0 (L) 36.0 - 46.0 %   MCV 74.6 (L) 78.0 - 100.0 fL   MCH 21.9 (L) 26.0 - 34.0 pg   MCHC 29.3 (L) 30.0 - 36.0 g/dL   RDW 27.3 (H) 11.5 - 15.5 %   Platelets 2,234 (HH) 150 - 400 K/uL    Comment: REPEATED TO VERIFY SPECIMEN CHECKED FOR CLOTS PLATELET COUNT CONFIRMED BY SMEAR CRITICAL RESULT CALLED TO, READ BACK BY AND VERIFIED WITH: BRAWNER,A. RN '@[id number]'$  ON 8.30.19 BY NMCCOY    Neutrophils Relative % 92 %   Neutro Abs 43.3 1.7 - 7.7 K/uL   Lymphocytes Relative 2 %   Lymphs Abs 0.8 0.7 - 4.0 K/uL   Monocytes Relative 5 %   Monocytes Absolute 2.3 0.1 - 1.0 K/uL   Eosinophils Relative 0 %   Eosinophils Absolute 0.0 0.0 - 0.7 K/uL   Basophils Relative 1 %   Basophils Absolute 0.3 0.0 - 0.1 K/uL   RBC Morphology POLYCHROMASIA PRESENT     Comment: TARGET CELLS RARE NRBCs Performed at [facility], [place] [address] [place], [place] [address]   Comprehensive metabolic panel     Status: Abnormal   Collection Time: 04/12/18 10:32 AM  Result Value Ref Range   Sodium 140 135 - 145  mmol/L   Potassium 4.7 3.5 - 5.1 mmol/L   Chloride 103 98 - 111 mmol/L   CO2 24 22 - 32 mmol/L   Glucose, Bld 139 (H) 70 - 99 mg/dL   BUN 38 (H) 8 - 23 mg/dL   Creatinine, Ser 2.48 (H) 0.44 - 1.00 mg/dL   Calcium 8.9 8.9 - 10.3 mg/dL   Total Protein 7.0 6.5 - 8.1 g/dL   Albumin 3.1 (L) 3.5 - 5.0 g/dL   AST 21 15 - 41 U/L   ALT 13 0 - 44 U/L   Alkaline Phosphatase 104 38 - 126 U/L  Total Bilirubin 0.8 0.3 - 1.2 mg/dL   GFR calc non Af Amer 18 (L) >60 mL/min   GFR calc Af Amer 20 (L) >60 mL/min    Comment: (NOTE) The eGFR has been calculated using the CKD EPI equation. This calculation has not been validated in all clinical situations. eGFR's persistently <60 mL/min signify possible Chronic Kidney Disease.    Anion gap 13 5 - 15    Comment: Performed at [facility], [place] [address] [place], [place] [address]  Troponin I     Status: Abnormal   Collection Time: 04/12/18 10:32 AM  Result Value Ref Range   Troponin I 0.08 (HH) <0.03 ng/mL    Comment: CRITICAL RESULT CALLED TO, READ BACK BY AND VERIFIED WITH: Audree Camel [id number] @ 0867 BY J SCOTTON Performed at [place] [address] [place], [place] [address]   CK     Status: None   Collection Time: 04/12/18 10:45 AM  Result Value Ref Range   Total CK 91 38 - 234 U/L    Comment: Performed at [facility], [place] [address] [place], [place] [address]  I-Stat CG4 Lactic Acid, ED     Status: None   Collection Time: 04/12/18 11:00 AM  Result Value Ref Range   Lactic Acid, Venous 1.36 0.5 - 1.9 mmol/L  Urinalysis, Routine w reflex microscopic     Status: Abnormal   Collection Time: 04/12/18 11:15 AM  Result Value Ref Range   Color, Urine AMBER (A) YELLOW    Comment: BIOCHEMICALS MAY BE AFFECTED BY COLOR   APPearance CLOUDY (A) CLEAR   Specific Gravity, Urine 1.020 1.005 - 1.030   pH 5.0 5.0 - 8.0   Glucose, UA NEGATIVE NEGATIVE mg/dL   Hgb urine dipstick  NEGATIVE NEGATIVE   Bilirubin Urine NEGATIVE NEGATIVE   Ketones, ur NEGATIVE NEGATIVE mg/dL   Protein, ur 30 (A) NEGATIVE mg/dL   Nitrite NEGATIVE NEGATIVE   Leukocytes, UA LARGE (A) NEGATIVE   RBC / HPF 6-10 0 - 5 RBC/hpf   WBC, UA >50 (H) 0 - 5 WBC/hpf   Bacteria, UA MANY (A) NONE SEEN   Squamous Epithelial / LPF 0-5 0 - 5   Mucus PRESENT    Hyaline Casts, UA PRESENT     Comment: Performed at [facility], [place] [address] [place], [place] [address]  I-Stat CG4 Lactic Acid, ED     Status: None   Collection Time: 04/12/18  1:41 PM  Result Value Ref Range   Lactic Acid, Venous 0.91 0.5 - 1.9 mmol/L    Dg Chest Port 1 View  Result Date: 04/12/2018 CLINICAL DATA:  Weakness and shortness of breath. EXAM: PORTABLE CHEST 1 VIEW COMPARISON:  Two-view chest x-ray 01/29/2018 FINDINGS: Heart is enlarged. There is no edema or effusion. Aortic atherosclerosis is present. Changes of COPD are again noted. Advanced degenerative changes are present at both shoulders. IMPRESSION: 1. Stable cardiomegaly without failure. 2. Aortic atherosclerosis. 3. Advanced degenerative changes involve the shoulders bilaterally. Electronically Signed   By: San Morelle M.D.   On: 04/12/2018 11:28   Dg Hip Unilat  With Pelvis 2-3 Views Right  Result Date: 04/12/2018 CLINICAL DATA:  Fall.  History of hip replacement EXAM: DG HIP (WITH OR WITHOUT PELVIS) 2-3V RIGHT COMPARISON:  Fluoroscopy from 02/04/2018 FINDINGS: Total right hip arthroplasty with recent revision. The prosthesis appears well seated. No periprosthetic fracture or dislocation. Remote trauma to the right obturator ring. No  evidence of acute pelvic ring fracture. There is gas lateral to the right hip, status post incision drainage yesterday per report. Advanced left hip osteoarthritis. IMPRESSION: No acute finding. Electronically Signed   By: Monte Fantasia M.D.   On: 04/12/2018 10:48    ROS: Patient has reported some generalized  weakness and dizziness over the last couple of days. Blood pressure 123/70, pulse 97, temperature 99.9 F (37.7 C), temperature source Rectal, resp. rate 17, height '5\' 4"'$  (1.626 m), weight 51.3 kg, SpO2 100 %. Physical Exam: Internal and external rotation of the right hip causes one plus discomfort.  Foot tap is negative swelling over the right hip is 2+.  The skin is not tense.  The needle stick site on the lateral hip just behind her incision from 2 months ago has no drainage.  Assessment/Plan: Status post revision right total up arthroplasty 2 months ago with what so far appears to be a hematoma beneath the incision site.  Aspiration yesterday yielded 220 cc of bloody fluid.  Cultures are negative at 24 hours and the patient is afebrile.  Because of her history of polycythemia vera following her white count is of little value.  It should be noted that her variation polycythemia vera has very high platelets that have little if any function.  Her main risk is bleeding at this time, not clotting.  This was discussed with Dr. Benay Spice her hematologist.  If at some point we have to wash out her right hip.  She will be given a single donor platelet transfusion and DDAVP per Dr. Gearldine Shown recommendation.  Kerin Salen 04/12/2018, 1:58 PM

## 2018-04-12 NOTE — ED Provider Notes (Addendum)
[facility] DEPT Provider Note   CSN: [id number] Arrival date & time: 04/12/18  2671     History   Chief Complaint Chief Complaint  Patient presents with  . Fall    HPI Alejandra [name] is a 77 y.o. female.  HPI  77 yo female ho avn s/p right hip replacement June presents with two episodes of fall yesterday.  Patient seen and evaluated by Dr Mayer Camel yesterday and having swelling around hip evaluated with aspiration.  Patient reports 2 falls yesterday before which she felt lightheaded.  She fell out of bed was unable to get up.  Her sister came to find her this morning when she did not hear from her.  She is complaining of some pain in her right hip.  She denies any other injuries from her falls.  She denies striking her head or loss of consciousness.  She denies chest pain or dyspnea.   Past Medical History:  Diagnosis Date  . Arthritis   . Avascular necrosis of femur head, right ([place]) 04/23/2012  . Cellulitis of shoulder 09/28/2014  . Fracture 10/05/11   "fell and broke right hip"  . Fracture of femoral neck, right ([place]) 10/12/2011  . Glaucoma   . Glaucoma    bilateral  . Hypertension   . Polycythemia vera(238.4)   . Rhabdomyolysis 09/28/2014  . Thrombocytosis ([place]) 04/23/2012    Patient Active Problem List   Diagnosis Date Noted  . Primary osteoarthritis of right hip 02/04/2018  . Failed total hip arthroplasty ([place]) 02/01/2018  . Pre-operative cardiovascular examination 01/31/2018  . Abnormal EKG 01/31/2018  . Vertigo 09/28/2014  . Essential hypertension 09/28/2014  . Thrombocytosis ([place]) 04/23/2012  . Polycythemia vera ([place]) 12/22/2011    Past Surgical History:  Procedure Laterality Date  . COLONOSCOPY    . HARDWARE REMOVAL  04/23/2012   Procedure: HARDWARE REMOVAL;  Surgeon: Johnny Bridge, MD;  Location: [facility];  Service: Orthopedics;  Laterality: Right;  . HIP ARTHROPLASTY  04/23/2012   Procedure: ARTHROPLASTY BIPOLAR HIP;  Surgeon:  Johnny Bridge, MD;  Location: [facility];  Service: Orthopedics;  Laterality: Right;  . HIP PINNING,CANNULATED  10/12/2011   Procedure: CANNULATED HIP PINNING;  Surgeon: Johnny Bridge, MD;  Location: [facility];  Service: Orthopedics;  Laterality: Right;  . TOTAL HIP REVISION Right 02/04/2018   Procedure: RIGHT TOTAL HIP REVISION: REVISION OF RIGHT MONOPOLAR/CONVERSION TO TOTAL HIP;  Surgeon: Frederik Pear, MD;  Location: [facility];  Service: Orthopedics;  Laterality: Right;  Requesting 2 hours     OB History   None      Home Medications    Prior to Admission medications   Medication Sig Start Date End Date Taking? Authorizing Provider  ALPRAZolam (XANAX) 0.25 MG tablet Take 0.25 mg by mouth 2 (two) times daily as needed for anxiety.    Yes [provider]  amLODipine (NORVASC) 10 MG tablet Take 10 mg by mouth daily.   Yes [provider]  atorvastatin (LIPITOR) 10 MG tablet Take 10 mg by mouth at bedtime.  07/12/15  Yes [provider]  benazepril (LOTENSIN) 40 MG tablet Take 40 mg by mouth daily.   Yes [provider]  hydroxyurea (HYDREA) 500 MG capsule Take 1 tablet (500mg ) every MWF. Take 2 tablets (1,000mg ) all other days Patient taking differently: Take 500-1,000 mg by mouth See admin instructions. Take 1 tablet (500mg ) every MWF. Take 2 tablets (1,000mg ) all other days 11/01/17  Yes Ladell Pier,  MD  latanoprost (XALATAN) 0.005 % ophthalmic solution Place 1 drop into both eyes at bedtime.  03/19/18  Yes [provider]  loratadine (CLARITIN) 10 MG tablet Take 10 mg by mouth daily as needed for allergies.   Yes [provider]  meclizine (ANTIVERT) 25 MG tablet Take 25 mg by mouth 3 (three) times daily as needed for dizziness.  03/25/14  Yes [provider]  oxyCODONE-acetaminophen (PERCOCET/ROXICET) 5-325 MG tablet Take 1 tablet by mouth every 4 (four) hours as needed for severe pain. 02/04/18  Yes Leighton Parody, PA-C  QC ASPIRIN  LOW DOSE 81 MG chewable tablet Chew 81 mg by mouth daily.  03/19/18  Yes [provider]  SIMBRINZA 1-0.2 % SUSP Place 1 drop into both eyes 2 (two) times daily.  12/14/15  Yes [provider]  tiZANidine (ZANAFLEX) 2 MG tablet Take 1 tablet (2 mg total) by mouth every 6 (six) hours as needed. Patient taking differently: Take 2 mg by mouth every 6 (six) hours as needed for muscle spasms.  02/04/18  Yes Hardin Negus, Eric K, PA-C  TRAVATAN Z 0.004 % SOLN ophthalmic solution Place 1 drop into both eyes at bedtime. 05/26/15  Yes [provider]    Family History Family History  Problem Relation Age of Onset  . Cancer Mother   . Hypertension Father   . Asthma Sister     Social History Social History   Tobacco Use  . Smoking status: Former Smoker    Packs/day: 0.12    Types: Cigarettes    Last attempt to quit: 08/15/1991    Years since quitting: 26.6  . Smokeless tobacco: Never Used  Substance Use Topics  . Alcohol use: No    Comment: 10/12/11 "did drink in my younger; don't drink at all now"  . Drug use: No     Allergies   Patient has no known allergies.   Review of Systems Review of Systems  Constitutional: Positive for activity change and appetite change.  HENT: Negative.   Eyes: Negative.   Respiratory: Negative.   Cardiovascular: Negative.   Gastrointestinal: Positive for nausea.  Endocrine: Negative.   Genitourinary: Negative.   Musculoskeletal: Positive for arthralgias.  Skin: Negative.   Neurological: Positive for light-headedness.  Hematological: Negative.   Psychiatric/Behavioral: Negative.   All other systems reviewed and are negative.    Physical Exam Updated Vital Signs BP 103/79 (BP Location: Right Arm)   Pulse (!) 55   Temp 98.5 F (36.9 C) (Oral)   Resp 18   Ht 1.626 m (5\' 4" )   Wt 51.3 kg   SpO2 99%   BMI 19.40 kg/m   Physical Exam  Constitutional: She is oriented to person, place, and time. She appears well-developed and  well-nourished.  Thin chronically ill-appearing female  HENT:  Head: Normocephalic and atraumatic.  Right Ear: External ear normal.  Left Ear: External ear normal.  Mucous membranes are somewhat dry  Eyes: Pupils are equal, round, and reactive to light. EOM are normal.  Neck: Normal range of motion. Neck supple.  Cardiovascular: Normal rate, regular rhythm and normal heart sounds.  Pulmonary/Chest: Effort normal.  Abdominal: Soft. Bowel sounds are normal.  Musculoskeletal: She exhibits tenderness. She exhibits no deformity.  Right hip with healing recent incision from the pubic surgery There is some erythema and induration with tenderness to palpation  Neurological: She is alert and oriented to person, place, and time.  Skin: Skin is warm and dry. Capillary refill takes less  than 2 seconds.  Nursing note and vitals reviewed.    ED Treatments / Results  Labs (all labs ordered are listed, but only abnormal results are displayed) Labs Reviewed - No data to display  EKG EKG Interpretation  Date/Time:  Friday April 12 2018 10:45:27 EDT Ventricular Rate:  90 PR Interval:    QRS Duration: 76 QT Interval:  369 QTC Calculation: 452 R Axis:   -16 Text Interpretation:  Normal sinus rhythm Nonspecific T wave abnormality Confirmed by Pattricia Boss [phone]) on 04/12/2018 11:12:34 AM   Radiology Dg Chest Port 1 View  Result Date: 04/12/2018 CLINICAL DATA:  Weakness and shortness of breath. EXAM: PORTABLE CHEST 1 VIEW COMPARISON:  Two-view chest x-Trypp Heckmann 01/29/2018 FINDINGS: Heart is enlarged. There is no edema or effusion. Aortic atherosclerosis is present. Changes of COPD are again noted. Advanced degenerative changes are present at both shoulders. IMPRESSION: 1. Stable cardiomegaly without failure. 2. Aortic atherosclerosis. 3. Advanced degenerative changes involve the shoulders bilaterally. Electronically Signed   By: San Morelle M.D.   On: 04/12/2018 11:28   Dg Hip Unilat  With  Pelvis 2-3 Views Right  Result Date: 04/12/2018 CLINICAL DATA:  Fall.  History of hip replacement EXAM: DG HIP (WITH OR WITHOUT PELVIS) 2-3V RIGHT COMPARISON:  Fluoroscopy from 02/04/2018 FINDINGS: Total right hip arthroplasty with recent revision. The prosthesis appears well seated. No periprosthetic fracture or dislocation. Remote trauma to the right obturator ring. No evidence of acute pelvic ring fracture. There is gas lateral to the right hip, status post incision drainage yesterday per report. Advanced left hip osteoarthritis. IMPRESSION: No acute finding. Electronically Signed   By: Monte Fantasia M.D.   On: 04/12/2018 10:48    Procedures .Critical Care Performed by: Pattricia Boss, MD Authorized by: Pattricia Boss, MD   Critical care provider statement:    Critical care time (minutes):  45   Critical care end time:  04/12/2018 12:24 PM   Critical care was necessary to treat or prevent imminent or life-threatening deterioration of the following conditions:  Sepsis and dehydration   Critical care was time spent personally by me on the following activities:  Discussions with consultants, evaluation of patient's response to treatment, examination of patient, ordering and performing treatments and interventions, ordering and review of laboratory studies, ordering and review of radiographic studies, pulse oximetry, re-evaluation of patient's condition, obtaining history from patient or surrogate and review of old charts   (including critical care time)  Medications Ordered in ED Medications - No data to display   Initial Impression / Assessment and Plan / ED Course  I have reviewed the triage vital signs and the nursing notes.  Pertinent labs & imaging results that were available during my care of the patient were reviewed by me and considered in my medical decision making (see chart for details).    Patient presents today with right hip pain, generalized weakness with syncope x2  yesterday on the floor all night unable to get up 1 right hip with possible infection x-rays show no evidence of dislocation or new fracture.  Dr. Mayer Camel surgeon and he is consulted as he tapped yesterday.  Discussed with Dr. Mayer Camel and he is in department evaluating patient 2 myeloproliferative disorder with polycythemia vera and psychosis.  However platelets are significantly increased today to over 2 million.  Patient with leukocytosis.  Hemoglobin is stable.  Oncology has been consulted. Discussed with Dr. Ammie Dalton and advises change in counts likely due to infection and inflammatory response likely increased  due to underlying problems today.  He does not think any specific oncologic intervention need ed but available for further consult if needed. 3 urinary tract infection patient will have urine cultured and is receiving IV Rocephin 4 patient with borderline hypotension likely contributed to falls she has new acute kidney injury with creatinine increased from below 1-2.5. 5-elevated troponin at 0.08 patient with nonischemic appearing EKG, no chest pain, suspect that this is some demand ischemia as patient was hypotensive.  Will need trended troponin Discussed with Dr. Tana Coast and will see for admission Final Clinical Impressions(s) / ED Diagnoses   Final diagnoses:  AKI (acute kidney injury) ([place])  Urinary tract infection with hematuria, site unspecified  Right hip pain  Fall, initial encounter  Hypotension, unspecified hypotension type    ED Discharge Orders    None       Pattricia Boss, MD 04/12/18 1226    Pattricia Boss, MD 04/12/18 1240

## 2018-04-12 NOTE — ED Notes (Signed)
Orthopedic surgeon at bedside. 

## 2018-04-12 NOTE — H&P (Addendum)
History and Physical        Hospital Admission Note Date: 04/12/2018  Patient name: Alejandra [name] Medical record number: [id number] Date of birth: 12-21-1940 Age: 77 y.o. Gender: female  PCP: Vincente Liberty, MD    Patient coming from: Home  I have reviewed all records in the [facility].    Chief Complaint:  FALL last night  HPI: Patient is a 77 year old female with history of AV necrosis of the right femur status post right hip replacement in 01/2018, hypertension, polycythemia vera, chronic thrombocytosis hyperlipidemia, vertigo presented to ED via EMS after a fall.  Patient lives alone and stated that she was in her usual state of health until last night around 1 AM when she rolled off her bed and landed on her right hip.  Per patient, she had fluid removed by her orthopedics, Dr Mayer Camel yesterday.  She denied any dizziness, lightheadedness or syncopal episode.  Initial BP for EMS was 88/60.  Per patient she felt very weak and due to pain, she was unable to pull herself up or call for help.  Her sister came to check up on her this morning around 8 AM and found her on the floor.  Besides complaints of sore everywhere, patient denied any other complaints of chest pain, dyspnea, nausea vomiting or abdominal pain.   ED work-up/course:   In ED, temp 98.5, respiratory rate 18, pulse 55, BP 103/79 CMET showed sodium 140, BUN 38, creatinine 2.4, CK 91, troponin 0 0.08, lactic acid 1.3 WBC is 46.7, WBC count was 24.4 on 02/12/2018 Hemoglobin 8.5, at baseline Platelets 2234K   Review of Systems: Positives marked in 'bold' Constitutional: Denies fever, chills, diaphoresis, + poor appetite and fatigue.  HEENT: Denies photophobia, eye pain, redness, hearing loss, ear pain, congestion, sore throat, rhinorrhea, sneezing, mouth sores, trouble swallowing, neck pain, neck stiffness  and tinnitus.   Respiratory: Denies SOB, DOE, cough, chest tightness,  and wheezing.   Cardiovascular: Denies chest pain, palpitations and leg swelling.  Gastrointestinal: Denies nausea, vomiting, abdominal pain, diarrhea, constipation, blood in stool and abdominal distention.  Genitourinary: + Increased frequency of urination, denies any hematuria Musculoskeletal: Please see HPI Neurological: Denies dizziness, seizures, syncope, weakness, light-headedness, numbness and headaches.  Hematological: Denies adenopathy. Easy bruising, personal or family bleeding history  Psychiatric/Behavioral: Denies suicidal ideation, mood changes, confusion, nervousness, sleep disturbance and agitation  Past Medical History: Past Medical History:  Diagnosis Date  . Arthritis   . Avascular necrosis of femur head, right ([place]) 04/23/2012  . Cellulitis of shoulder 09/28/2014  . Fracture 10/05/11   "fell and broke right hip"  . Fracture of femoral neck, right ([place]) 10/12/2011  . Glaucoma   . Glaucoma    bilateral  . Hypertension   . Polycythemia vera(238.4)   . Rhabdomyolysis 09/28/2014  . Thrombocytosis ([place]) 04/23/2012    Past Surgical History:  Procedure Laterality Date  . COLONOSCOPY    . HARDWARE REMOVAL  04/23/2012   Procedure: HARDWARE REMOVAL;  Surgeon: Johnny Bridge, MD;  Location: [facility];  Service: Orthopedics;  Laterality: Right;  . HIP ARTHROPLASTY  04/23/2012   Procedure: ARTHROPLASTY BIPOLAR HIP;  Surgeon: Johnny Bridge,  MD;  Location: [facility];  Service: Orthopedics;  Laterality: Right;  . HIP PINNING,CANNULATED  10/12/2011   Procedure: CANNULATED HIP PINNING;  Surgeon: Johnny Bridge, MD;  Location: [facility];  Service: Orthopedics;  Laterality: Right;  . TOTAL HIP REVISION Right 02/04/2018   Procedure: RIGHT TOTAL HIP REVISION: REVISION OF RIGHT MONOPOLAR/CONVERSION TO TOTAL HIP;  Surgeon: Frederik Pear, MD;  Location: [facility];  Service: Orthopedics;  Laterality: Right;  Requesting 2 hours     Medications: Prior to Admission medications   Medication Sig Start Date End Date Taking? Authorizing Provider  ALPRAZolam (XANAX) 0.25 MG tablet Take 0.25 mg by mouth 2 (two) times daily as needed for anxiety.    Yes [provider]  amLODipine (NORVASC) 10 MG tablet Take 10 mg by mouth daily.   Yes [provider]  atorvastatin (LIPITOR) 10 MG tablet Take 10 mg by mouth at bedtime.  07/12/15  Yes [provider]  benazepril (LOTENSIN) 40 MG tablet Take 40 mg by mouth daily.   Yes [provider]  hydroxyurea (HYDREA) 500 MG capsule Take 1 tablet (500mg ) every MWF. Take 2 tablets (1,000mg ) all other days Patient taking differently: Take 500-1,000 mg by mouth See admin instructions. Take 1 tablet (500mg ) every MWF. Take 2 tablets (1,000mg ) all other days 11/01/17  Yes Ladell Pier, MD  latanoprost (XALATAN) 0.005 % ophthalmic solution Place 1 drop into both eyes at bedtime.  03/19/18  Yes [provider]  loratadine (CLARITIN) 10 MG tablet Take 10 mg by mouth daily as needed for allergies.   Yes [provider]  meclizine (ANTIVERT) 25 MG tablet Take 25 mg by mouth 3 (three) times daily as needed for dizziness.  03/25/14  Yes [provider]  oxyCODONE-acetaminophen (PERCOCET/ROXICET) 5-325 MG tablet Take 1 tablet by mouth every 4 (four) hours as needed for severe pain. 02/04/18  Yes Leighton Parody, PA-C  QC ASPIRIN LOW DOSE 81 MG chewable tablet Chew 81 mg by mouth daily.  03/19/18  Yes [provider]  SIMBRINZA 1-0.2 % SUSP Place 1 drop into both eyes 2 (two) times daily.  12/14/15  Yes [provider]  tiZANidine (ZANAFLEX) 2 MG tablet Take 1 tablet (2 mg total) by mouth every 6 (six) hours as needed. Patient taking differently: Take 2 mg by mouth every 6 (six) hours as needed for muscle spasms.  02/04/18  Yes Hardin Negus, Eric K, PA-C  TRAVATAN Z 0.004 % SOLN ophthalmic solution Place 1 drop into both eyes at  bedtime. 05/26/15  Yes [provider]    Allergies:  No Known Allergies  Social History:  reports that she quit smoking about 26 years ago. Her smoking use included cigarettes. She smoked 0.12 packs per day. She has never used smokeless tobacco. She reports that she does not drink alcohol or use drugs.  Family History: Family History  Problem Relation Age of Onset  . Cancer Mother   . Hypertension Father   . Asthma Sister     Physical Exam: Blood pressure 130/83, pulse 88, temperature 99.9 F (37.7 C), temperature source Rectal, resp. rate 15, height 5\' 4"  (1.626 m), weight 51.3 kg, SpO2 100 %. General: Alert, awake, oriented x3, in no acute distress. Eyes: pink conjunctiva,anicteric sclera, pupils equal and reactive to light and accomodation, HEENT: normocephalic, atraumatic, oropharynx clear Neck: supple, no masses or lymphadenopathy, no goiter, no bruits, no JVD CVS: Regular rate and rhythm, without murmurs, rubs or gallops. No lower extremity edema Resp :  Clear to auscultation bilaterally, no wheezing, rales or rhonchi. GI : Soft, nontender, nondistended, positive bowel sounds, no masses. No hepatomegaly. No hernia.  Musculoskeletal: Mass with healing recent incision with induration and tenderness to palpation  Neuro: Grossly intact, no focal neurological deficits, strength 5/5 upper and lower extremities bilaterally Psych: alert and oriented x 3, normal mood and affect Skin: no rashes or lesions, warm and dry   LABS on Admission: I have personally reviewed all the labs and imagings below    Basic Metabolic Panel: Recent Labs  Lab 04/12/18 1032  NA 140  K 4.7  CL 103  CO2 24  GLUCOSE 139*  BUN 38*  CREATININE 2.48*  CALCIUM 8.9   Liver Function Tests: Recent Labs  Lab 04/12/18 1032  AST 21  ALT 13  ALKPHOS 104  BILITOT 0.8  PROT 7.0  ALBUMIN 3.1*   No results for input(s): LIPASE, AMYLASE in the last 168 hours. No results for input(s): AMMONIA  in the last 168 hours. CBC: Recent Labs  Lab 04/12/18 1032  WBC 46.7*  NEUTROABS 43.3  HGB 8.5*  HCT 29.0*  MCV 74.6*  PLT 2,234*   Cardiac Enzymes: Recent Labs  Lab 04/12/18 1032 04/12/18 1045  CKTOTAL  --  91  TROPONINI 0.08*  --    BNP: Invalid input(s): POCBNP CBG: No results for input(s): GLUCAP in the last 168 hours.  Radiological Exams on Admission:  Dg Chest Port 1 View  Result Date: 04/12/2018 CLINICAL DATA:  Weakness and shortness of breath. EXAM: PORTABLE CHEST 1 VIEW COMPARISON:  Two-view chest x-ray 01/29/2018 FINDINGS: Heart is enlarged. There is no edema or effusion. Aortic atherosclerosis is present. Changes of COPD are again noted. Advanced degenerative changes are present at both shoulders. IMPRESSION: 1. Stable cardiomegaly without failure. 2. Aortic atherosclerosis. 3. Advanced degenerative changes involve the shoulders bilaterally. Electronically Signed   By: San Morelle M.D.   On: 04/12/2018 11:28   Dg Hip Unilat  With Pelvis 2-3 Views Right  Result Date: 04/12/2018 CLINICAL DATA:  Fall.  History of hip replacement EXAM: DG HIP (WITH OR WITHOUT PELVIS) 2-3V RIGHT COMPARISON:  Fluoroscopy from 02/04/2018 FINDINGS: Total right hip arthroplasty with recent revision. The prosthesis appears well seated. No periprosthetic fracture or dislocation. Remote trauma to the right obturator ring. No evidence of acute pelvic ring fracture. There is gas lateral to the right hip, status post incision drainage yesterday per report. Advanced left hip osteoarthritis. IMPRESSION: No acute finding. Electronically Signed   By: Monte Fantasia M.D.   On: 04/12/2018 10:48      EKG: Independently reviewed.  90, normal sinus rhythm, no acute ST-T wave changes suggestive of ischemia.   Assessment/Plan  Fall with right hip pain -Right hip x-ray showed no acute findings, there is gas lateral to the right hip status post incision and drainage -Orthopedics has been  consulted, patient has been seen by Dr. Mayer Camel.  Will await for formal recommendations, per Dr Mayer Camel cultures are so far negative from I&D yesterday. -PT OT evaluation, pain control  UTI -Follow urine culture and sensitivities, placed on IV Rocephin  Hypotension -Hold Norvasc, placed on gentle hydration, BP improving  Elevated troponin -No chest pain or shortness of breath, obtain serial cardiac enzymes, placed on telemetry, obtain 2D echo for further work-up  Myeloproliferative disorder with polycythemia vera, thrombocytosis -WBC count worse likely due to UTI.  H&H at baseline -Increased platelets likely due to reactive thrombocytosis from infection -Continue to monitor counts closely  Acute kidney injury -Likely due to hypotension, UTI.  Baseline 0.95 in June/2019 -No rhabdomyolysis, CK 91.  Hold benazepril until creatinine improving and BP stabilized. -Renal ultrasound if no significant improvement in renal function   DVT prophylaxis: Heparin subcu  CODE STATUS: Full CODE STATUS  Consults called: None  Family Communication: Admission, patients condition and plan of care including tests being ordered have been discussed with the patient and sister who indicates understanding and agree with the plan and Code Status  Admission status: inpatient tele   Disposition plan: Further plan will depend as patient's clinical course evolves and further radiologic and laboratory data become available.    At the time of admission, it appears that the appropriate admission status for this patient is INPATIENT . This is judged to be reasonable and necessary in order to provide the required intensity of service to ensure the patient's safety given the presenting symptoms fall with right hip pain, UTI, unable to ambulate physical exam findings, and initial radiographic and laboratory data in the context of their chronic comorbidities.  The medical decision making on this patient was of high  complexity and the patient is at high risk for clinical deterioration, therefore this is a level 3 visit.   Time Spent on Admission: 65 minutes    Ripudeep Rai M.D. [facility] 04/12/2018, 1:19 PM Pager: [phone]  If 7PM-7AM, please contact night-coverage [email or web address] Password [email or web address]

## 2018-04-12 NOTE — Progress Notes (Signed)
  Echocardiogram 2D Echocardiogram has been performed.  Alejandra [name] 04/12/2018, 4:03 PM

## 2018-04-12 NOTE — ED Notes (Signed)
Patient transported to x-ray. ?

## 2018-04-12 NOTE — ED Triage Notes (Signed)
Patient BIB EMS from home with complaints of rolling off of her bed last night and landing on her right hip. Patient had a right hip replacement one month ago, and had fluid removed from the incision site yesterday. Patient denies pain at this time. Bleeding from surgical site noted. Initially, patient's blood pressure for EMS was 88/60- PIV Placed and fluids administered by EMS. AxOx4. Patient ambulatory with assist for EMS.  EMS VS: 133/79, HR97, [id number]. CBG 178.  20G Left AC PIV placed by EMS. 327mL IV NS administered by EMS for hypotension

## 2018-04-12 NOTE — ED Notes (Signed)
Bed: CN16 Expected date:  Expected time:  Means of arrival:  Comments: 77 yo fall from bed, bleeding from surgical site

## 2018-04-12 NOTE — Progress Notes (Addendum)
IP PROGRESS NOTE  Subjective:   Alejandra [name] is known to me with a history of polycythemia vera.  She underwent a revision hip arthroplasty on 02/04/2018.  She had oozing during surgery and was treated with tranexamic acid.  She continued to have oozing following surgery and was transfused 6 units of packed red blood cells.  She was seen 1 week following surgery and continued to have oozing from from the wound.  She was discharged 02/12/2018. She developed spontaneous swelling in the right hip approximately 4 days ago.  She was seen by Dr. Mayer Camel.  220 cc of dark bloody fluid was aspirated.  White cells were noted on the Gram stain but no organisms.  Cultures were negative at 24 hours.  She fell out of bed yesterday evening and was found by her sister.  She presented to the emergency room for further evaluation.  He was noted to have renal failure and evidence of a urinary tract infection.  Her hemoglobin is stable compared to when she was discharged in the hospital 02/12/2018.  She was last seen at the Cancer center in March 2019.  The hemoglobin at that time return to 12.7 with a platelet count of 1,283,000, and a white count of 24.6 with 20.9 neutrophils.  She has not returned for follow-up.  She reports no other bleeding.  No history of bleeding with surgery in the past.  Her appetite has been poor over the past week.     Objective: Vital signs in last 24 hours: Blood pressure 127/77, pulse 73, temperature (!) 97.5 F (36.4 C), temperature source Oral, resp. rate 16, height 5\' 4"  (1.626 m), weight 113 lb (51.3 kg), SpO2 (!) 85 %.  Intake/Output from previous day: No intake/output data recorded.  Physical Exam:  HEENT: No thrush or ulcers Lungs: Decreased breath sounds at the left lower chest, no respiratory distress Cardiac: Regular rate and rhythm Abdomen: No hepatosplenomegaly Extremities: No leg edema Musculoskeletal: Dry pad covering the right upper thigh wound.  Mild surrounding  swelling and warmth.  Slight dark discoloration at the right upper thigh    Lab Results: Recent Labs    04/12/18 1032  WBC 46.7*  HGB 8.5*  HCT 29.0*  PLT 2,234*    BMET Recent Labs    04/12/18 1032  NA 140  K 4.7  CL 103  CO2 24  GLUCOSE 139*  BUN 38*  CREATININE 2.48*  CALCIUM 8.9    No results found for: CEA1  Studies/Results: Dg Chest Port 1 View  Result Date: 04/12/2018 CLINICAL DATA:  Weakness and shortness of breath. EXAM: PORTABLE CHEST 1 VIEW COMPARISON:  Two-view chest x-ray 01/29/2018 FINDINGS: Heart is enlarged. There is no edema or effusion. Aortic atherosclerosis is present. Changes of COPD are again noted. Advanced degenerative changes are present at both shoulders. IMPRESSION: 1. Stable cardiomegaly without failure. 2. Aortic atherosclerosis. 3. Advanced degenerative changes involve the shoulders bilaterally. Electronically Signed   By: San Morelle M.D.   On: 04/12/2018 11:28   Dg Hip Unilat  With Pelvis 2-3 Views Right  Result Date: 04/12/2018 CLINICAL DATA:  Fall.  History of hip replacement EXAM: DG HIP (WITH OR WITHOUT PELVIS) 2-3V RIGHT COMPARISON:  Fluoroscopy from 02/04/2018 FINDINGS: Total right hip arthroplasty with recent revision. The prosthesis appears well seated. No periprosthetic fracture or dislocation. Remote trauma to the right obturator ring. No evidence of acute pelvic ring fracture. There is gas lateral to the right hip, status post incision drainage yesterday per report.  Advanced left hip osteoarthritis. IMPRESSION: No acute finding. Electronically Signed   By: Monte Fantasia M.D.   On: 04/12/2018 10:48    Medications: I have reviewed the patient's current medications.  Assessment/Plan: 1. Polycythemia vera on hydroxyurea. Last phlebotomy 09/08/2015  Hydroxyurea dose increased to 1500 mg daily 06/26/2016  Hydroxyurea changed to 1500 mg daily on Monday through Friday, 1000 mg on Saturday and Sunday beginning  09/11/2016  Hydroxyurea changed to 1500 mg Monday Wednesday and Friday and 1000 mg other days beginning 10/10/2016  Hydroxyurea changed to 1000 mg daily beginning 10/31/2016  Hydroxyurea placed on hold beginning 11/27/2016-she misunderstood our instructions and continued 1000 mg daily.  Hydroxyurea dose decreased to 500 mg daily beginning 01/01/2017  Hydroxyurea dose increased 06/05/2017  Hydroxyurea dose increased to 1000 mg daily except for 500 mg on Monday, Wednesday, and Friday, 11/01/2017 2. Hypertension, followed by Dr. Montez Morita.  3. History of hyperpigmentation at the hands. Question related to hydroxyurea. 4. Hip fracture February 2013. She underwent a bipolar hip arthroplasty on 04/23/2012. 5. Thrombocytosis. Most likely secondary to polycythemia vera and iron deficiency. 6. Hospitalization with left shoulder cellulitis February 2016. 7. Pelvic fracture after a fall 05/31/2015 8. Weight loss-etiology unclear; weight stable 12/16/2015 and06/15/2017 9. Right anterior buccal ulcer 03/09/2016 10. Revision of right hip arthroplasty 9323 complicated by intraoperative and postoperative bleeding requiring multiple red cell transfusions 11. Swelling at the right hip surgical site week of 04/08/2018- 220 cc bloody fluid aspirated from the right hip surgical site 04/11/2018   Alejandra [name] has polycythemia vera.  She is admitted after a fall.  She appears to have a hematoma at the right hip arthroplasty site.  She was noted to have bleeding during and following right hip surgery in June.  She may have postoperative/mechanical bleeding versus bleeding related to the proliferative disorder with dysfunctional platelets.  The hemoglobin is stable compared to when she was discharged from the hospital in July.  If she is confirmed to have an infection and requires surgery I will recommend platelet transfusion support.  I recommend a unit of single donor a pheresis platelets be given on call to  surgery and a dose of DDAVP.  I discussed the case with Dr. Alvy Bimler.  She will be available as needed over the weekend.  I will check on Alejandra [name] 04/15/2018.  I will place aspirin and heparin on hold given the suspected bleeding.  The platelet count is not adequately controlled with hydroxyurea.  We will consider starting anagrelide at discharge.   LOS: 0 days   Betsy Coder, MD   04/12/2018, 3:42 PM

## 2018-04-12 NOTE — ED Notes (Signed)
Second ISTAT Lactic collected timed out before lab person could run it, RN notified that a recollection is needed.

## 2018-04-12 NOTE — ED Notes (Signed)
ED TO INPATIENT HANDOFF REPORT  Name/Age/Gender Alejandra [name] 77 y.o. female  Code Status Code Status History    Date Active Date Inactive Code Status Order ID Comments User Context   02/04/2018 1824 02/12/2018 1820 Full Code [id number]  N[facility]don [place] Inpatient   09/28/2014 0136 09/29/2014 1602 Partial Code [id number]  Ivor Costa, MD Inpatient   10/12/2011 1510 10/13/2011 0054 Full Code [id number]  Johnny Bridge, MD Inpatient    Advance Directive Documentation     Most Recent Value  Type of Advance Directive  Healthcare Power of [place], Living will  Pre-existing out of facility DNR order (yellow form or pink MOST form)  -  "MOST" Form in Place?  -      Home/SNF/Other Home  Chief Complaint fall/bleeding from surgical site  Level of Care/Admitting Diagnosis ED Disposition    ED Disposition Condition [place]: [place] [100102]  Level of Care: Telemetry [5]  Admit to tele based on following criteria: Other see comments  Comments: SEPSIS  Diagnosis: UTI (urinary tract infection) [102725]  Admitting Physician: Mendel Corning [[id number]]  Attending Physician: RAI, RIPUDEEP K [[id number]]  Estimated length of stay: past midnight tomorrow  Certification:: I certify this patient will need inpatient services for at least 2 midnights  PT Class (Do Not Modify): Inpatient [101]  PT Acc Code (Do Not Modify): Private [1]       Medical History Past Medical History:  Diagnosis Date  . Arthritis   . Avascular necrosis of femur head, right ([place]) 04/23/2012  . Cellulitis of shoulder 09/28/2014  . Fracture 10/05/11   "fell and broke right hip"  . Fracture of femoral neck, right ([place]) 10/12/2011  . Glaucoma   . Glaucoma    bilateral  . Hypertension   . Polycythemia vera(238.4)   . Rhabdomyolysis 09/28/2014  . Thrombocytosis ([place]) 04/23/2012    Allergies No Known Allergies  IV Location/Drains/Wounds Patient Lines/Drains/Airways Status    Active Line/Drains/Airways    Name:   Placement date:   Placement time:   Site:   Days:   Peripheral IV 04/12/18 Left Antecubital   04/12/18    -    Antecubital   less than 1   External Urinary Catheter   02/06/18    1000    -   65   Incision (Closed) 02/04/18 Hip Right   02/04/18    1637     67          Labs/Imaging Results for orders placed or performed during the hospital encounter of 04/12/18 (from the past 48 hour(s))  CBC with Differential/Platelet     Status: Abnormal   Collection Time: 04/12/18 10:32 AM  Result Value Ref Range   WBC 46.7 (H) 4.0 - 10.5 K/uL    Comment: WHITE COUNT CONFIRMED ON SMEAR   RBC 3.89 3.87 - 5.11 MIL/uL   Hemoglobin 8.5 (L) 12.0 - 15.0 g/dL   HCT 29.0 (L) 36.0 - 46.0 %   MCV 74.6 (L) 78.0 - 100.0 fL   MCH 21.9 (L) 26.0 - 34.0 pg   MCHC 29.3 (L) 30.0 - 36.0 g/dL   RDW 27.3 (H) 11.5 - 15.5 %   Platelets 2,234 (HH) 150 - 400 K/uL    Comment: REPEATED TO VERIFY SPECIMEN CHECKED FOR CLOTS PLATELET COUNT CONFIRMED BY SMEAR CRITICAL RESULT CALLED TO, READ BACK BY AND VERIFIED WITH: BRAWNER,A. RN '@1132'$  ON 8.30.19 BY NMCCOY    Neutrophils Relative %  92 %   Neutro Abs 43.3 1.7 - 7.7 K/uL   Lymphocytes Relative 2 %   Lymphs Abs 0.8 0.7 - 4.0 K/uL   Monocytes Relative 5 %   Monocytes Absolute 2.3 0.1 - 1.0 K/uL   Eosinophils Relative 0 %   Eosinophils Absolute 0.0 0.0 - 0.7 K/uL   Basophils Relative 1 %   Basophils Absolute 0.3 0.0 - 0.1 K/uL   RBC Morphology POLYCHROMASIA PRESENT     Comment: TARGET CELLS RARE NRBCs Performed at [facility], [place] [address], [place], [place] [address]   Comprehensive metabolic panel     Status: Abnormal   Collection Time: 04/12/18 10:32 AM  Result Value Ref Range   Sodium 140 135 - 145 mmol/L   Potassium 4.7 3.5 - 5.1 mmol/L   Chloride 103 98 - 111 mmol/L   CO2 24 22 - 32 mmol/L   Glucose, Bld 139 (H) 70 - 99 mg/dL   BUN 38 (H) 8 - 23 mg/dL   Creatinine, Ser 2.48 (H) 0.44 - 1.00 mg/dL    Calcium 8.9 8.9 - 10.3 mg/dL   Total Protein 7.0 6.5 - 8.1 g/dL   Albumin 3.1 (L) 3.5 - 5.0 g/dL   AST 21 15 - 41 U/L   ALT 13 0 - 44 U/L   Alkaline Phosphatase 104 38 - 126 U/L   Total Bilirubin 0.8 0.3 - 1.2 mg/dL   GFR calc non Af Amer 18 (L) >60 mL/min   GFR calc Af Amer 20 (L) >60 mL/min    Comment: (NOTE) The eGFR has been calculated using the CKD EPI equation. This calculation has not been validated in all clinical situations. eGFR's persistently <60 mL/min signify possible Chronic Kidney Disease.    Anion gap 13 5 - 15    Comment: Performed at [facility], [place] [address] [place], [place] [address]  Troponin I     Status: Abnormal   Collection Time: 04/12/18 10:32 AM  Result Value Ref Range   Troponin I 0.08 (HH) <0.03 ng/mL    Comment: CRITICAL RESULT CALLED TO, READ BACK BY AND VERIFIED WITH: Audree Camel [id number] @ 4010 BY J SCOTTON Performed at [place] [address] [place], [place] [address]   CK     Status: None   Collection Time: 04/12/18 10:45 AM  Result Value Ref Range   Total CK 91 38 - 234 U/L    Comment: Performed at [facility], [place] [address] [place], [place] [address]  I-Stat CG4 Lactic Acid, ED     Status: None   Collection Time: 04/12/18 11:00 AM  Result Value Ref Range   Lactic Acid, Venous 1.36 0.5 - 1.9 mmol/L  Urinalysis, Routine w reflex microscopic     Status: Abnormal   Collection Time: 04/12/18 11:15 AM  Result Value Ref Range   Color, Urine AMBER (A) YELLOW    Comment: BIOCHEMICALS MAY BE AFFECTED BY COLOR   APPearance CLOUDY (A) CLEAR   Specific Gravity, Urine 1.020 1.005 - 1.030   pH 5.0 5.0 - 8.0   Glucose, UA NEGATIVE NEGATIVE mg/dL   Hgb urine dipstick NEGATIVE NEGATIVE   Bilirubin Urine NEGATIVE NEGATIVE   Ketones, ur NEGATIVE NEGATIVE mg/dL   Protein, ur 30 (A) NEGATIVE mg/dL   Nitrite NEGATIVE NEGATIVE   Leukocytes, UA LARGE (A) NEGATIVE   RBC / HPF 6-10  0 - 5 RBC/hpf   WBC, UA >50 (H) 0 - 5  WBC/hpf   Bacteria, UA MANY (A) NONE SEEN   Squamous Epithelial / LPF 0-5 0 - 5   Mucus PRESENT    Hyaline Casts, UA PRESENT     Comment: Performed at [facility], [place] [address] [place], [place] [address]  I-Stat CG4 Lactic Acid, ED     Status: None   Collection Time: 04/12/18  1:41 PM  Result Value Ref Range   Lactic Acid, Venous 0.91 0.5 - 1.9 mmol/L   Dg Chest Port 1 View  Result Date: 04/12/2018 CLINICAL DATA:  Weakness and shortness of breath. EXAM: PORTABLE CHEST 1 VIEW COMPARISON:  Two-view chest x-ray 01/29/2018 FINDINGS: Heart is enlarged. There is no edema or effusion. Aortic atherosclerosis is present. Changes of COPD are again noted. Advanced degenerative changes are present at both shoulders. IMPRESSION: 1. Stable cardiomegaly without failure. 2. Aortic atherosclerosis. 3. Advanced degenerative changes involve the shoulders bilaterally. Electronically Signed   By: San Morelle M.D.   On: 04/12/2018 11:28   Dg Hip Unilat  With Pelvis 2-3 Views Right  Result Date: 04/12/2018 CLINICAL DATA:  Fall.  History of hip replacement EXAM: DG HIP (WITH OR WITHOUT PELVIS) 2-3V RIGHT COMPARISON:  Fluoroscopy from 02/04/2018 FINDINGS: Total right hip arthroplasty with recent revision. The prosthesis appears well seated. No periprosthetic fracture or dislocation. Remote trauma to the right obturator ring. No evidence of acute pelvic ring fracture. There is gas lateral to the right hip, status post incision drainage yesterday per report. Advanced left hip osteoarthritis. IMPRESSION: No acute finding. Electronically Signed   By: Monte Fantasia M.D.   On: 04/12/2018 10:48    Pending Labs Unresulted Labs (From admission, onward)    Start     Ordered   04/12/18 1148  Urine Culture  Add-on,   STAT     04/12/18 1147   04/12/18 1032  Pathologist smear review  Once,   R     04/12/18 1032   Signed and Held  Urine culture  Once,   R      Signed and Held   Signed and Held  Basic metabolic panel  Tomorrow morning,   R     Signed and Held   Signed and Held  CBC  Tomorrow morning,   R     Signed and Held   Signed and Held  CBC  (heparin)  Once,   R    Comments:  Baseline for heparin therapy IF NOT ALREADY DRAWN.  Notify MD if PLT < 100 K.    Signed and Held   Signed and Held  Creatinine, serum  (heparin)  Once,   R    Comments:  Baseline for heparin therapy IF NOT ALREADY DRAWN.    Signed and Held   Signed and Held  Troponin I  Now then every 6 hours,   R     Signed and Held          Vitals/Pain Today's Vitals   04/12/18 1210 04/12/18 1230 04/12/18 1300 04/12/18 1330  BP:  (!) 144/76 (!) 174/88 123/70  Pulse: 88 86 100 97  Resp: 15 20 (!) 23 17  Temp:      TempSrc:      SpO2: 100% 100% 100% 100%  Weight:      Height:      PainSc:        Isolation Precautions No active isolations  Medications Medications  0.9 %  sodium chloride infusion ( Intravenous New Bag/Given 04/12/18 1324)  sodium chloride 0.9 % bolus 1,000 mL (0 mLs Intravenous Stopped 04/12/18 1150)  acetaminophen (TYLENOL) tablet 650 mg (650 mg Oral Given 04/12/18 1130)  sodium chloride 0.9 % bolus 1,000 mL (0 mLs Intravenous Stopped 04/12/18 1317)  cefTRIAXone (ROCEPHIN) 2 g in sodium chloride 0.9 % 100 mL IVPB (0 g Intravenous Stopped 04/12/18 1240)    Mobility non-ambulatory

## 2018-04-12 NOTE — ED Notes (Signed)
CRITICAL VALUE STICKER  CRITICAL VALUE: Platelets 2,234,000, Troponin 0.08  RECEIVER (on-site recipient of call): ABrawner,RN  DATE & TIME NOTIFIED: 04/12/18 1132  MESSENGER (representative from lab): lab  MD NOTIFIED: EDMD Ray  TIME OF NOTIFICATION: 04/12/18 1133  RESPONSE: see orders

## 2018-04-13 DIAGNOSIS — M25551 Pain in right hip: Secondary | ICD-10-CM

## 2018-04-13 DIAGNOSIS — W19XXXA Unspecified fall, initial encounter: Secondary | ICD-10-CM

## 2018-04-13 DIAGNOSIS — Y92009 Unspecified place in unspecified non-institutional (private) residence as the place of occurrence of the external cause: Secondary | ICD-10-CM

## 2018-04-13 DIAGNOSIS — R7989 Other specified abnormal findings of blood chemistry: Secondary | ICD-10-CM

## 2018-04-13 DIAGNOSIS — N390 Urinary tract infection, site not specified: Secondary | ICD-10-CM

## 2018-04-13 DIAGNOSIS — N179 Acute kidney failure, unspecified: Secondary | ICD-10-CM

## 2018-04-13 DIAGNOSIS — R778 Other specified abnormalities of plasma proteins: Secondary | ICD-10-CM

## 2018-04-13 LAB — CBC
HCT: 24.1 % — ABNORMAL LOW (ref 36.0–46.0)
HCT: 25.7 % — ABNORMAL LOW (ref 36.0–46.0)
HEMOGLOBIN: 7 g/dL — AB (ref 12.0–15.0)
Hemoglobin: 7.6 g/dL — ABNORMAL LOW (ref 12.0–15.0)
MCH: 22 pg — AB (ref 26.0–34.0)
MCH: 23.3 pg — ABNORMAL LOW (ref 26.0–34.0)
MCHC: 29 g/dL — AB (ref 30.0–36.0)
MCHC: 29.6 g/dL — AB (ref 30.0–36.0)
MCV: 75.8 fL — ABNORMAL LOW (ref 78.0–100.0)
MCV: 78.8 fL (ref 78.0–100.0)
PLATELETS: 1345 10*3/uL — AB (ref 150–400)
Platelets: 1803 10*3/uL (ref 150–400)
RBC: 3.18 MIL/uL — ABNORMAL LOW (ref 3.87–5.11)
RBC: 3.26 MIL/uL — ABNORMAL LOW (ref 3.87–5.11)
RDW: 27.3 % — AB (ref 11.5–15.5)
RDW: 24.8 % — ABNORMAL HIGH (ref 11.5–15.5)
WBC: 33 10*3/uL — ABNORMAL HIGH (ref 4.0–10.5)
WBC: 30.6 10*3/uL — ABNORMAL HIGH (ref 4.0–10.5)

## 2018-04-13 LAB — BASIC METABOLIC PANEL
Anion gap: 7 (ref 5–15)
BUN: 32 mg/dL — AB (ref 8–23)
CALCIUM: 8.4 mg/dL — AB (ref 8.9–10.3)
CO2: 25 mmol/L (ref 22–32)
Chloride: 111 mmol/L (ref 98–111)
Creatinine, Ser: 1.18 mg/dL — ABNORMAL HIGH (ref 0.44–1.00)
GFR calc Af Amer: 50 mL/min — ABNORMAL LOW (ref >60)
GFR calc non Af Amer: 43 mL/min — ABNORMAL LOW (ref >60)
GLUCOSE: 97 mg/dL (ref 70–99)
Potassium: 4.4 mmol/L (ref 3.5–5.1)
Sodium: 143 mmol/L (ref 135–145)

## 2018-04-13 LAB — TROPONIN I: Troponin I: 0.06 ng/mL (ref <0.03)

## 2018-04-13 LAB — PREPARE RBC (CROSSMATCH)

## 2018-04-13 MED ORDER — SODIUM CHLORIDE 0.9% IV SOLUTION
Freq: Once | INTRAVENOUS | Status: AC
  Administered 2018-04-13: 16:00:00 via INTRAVENOUS

## 2018-04-13 NOTE — Progress Notes (Signed)
PT Cancellation Note  Patient Details Name: NIKOLA BLACKSTON MRN: [id number] DOB: 01/31/1941   Cancelled Treatment:    Reason Eval/Treat Not Completed: Patient declined, no reason specified Attempted to perform skilled PT evaluation, patient adamantly declines this afternoon due to significant pain/soreness, stiffness. Unable to convince patient to participate in session this afternoon. At this point will plan to try to attempt again on next day of service.    Deniece Ree PT, DPT, CBIS  Supplemental Physical Therapist [facility]   Pager [phone]

## 2018-04-13 NOTE — Progress Notes (Signed)
[name]  MRN: [id number]  DOB/AGE:  Dec 16, 1940 / 77 y.o.        PROGRESS NOTE Subjective: Patient is alert, oriented, no Nausea, no Vomiting, yes passing gas, . Taking PO well. Denies SOB, Chest or Calf Pain.  Pain is significantly diminished from yesterday and she reports feeling stronger.  Objective: Vital signs in last 24 hours: Vitals:   04/12/18 1412 04/13/18 0451 04/13/18 0942 04/13/18 1348  BP:  124/81 123/73 (Abnormal) 105/59  Pulse:  95 91 88  Resp:  12 16 18   Temp:  98.8 F (37.1 C) 98.9 F (37.2 C) 98.7 F (37.1 C)  TempSrc:  Oral Oral Oral  SpO2:  100% 100% 99%  Weight: 53.4 kg     Height: 5\' 4"  (1.626 m)         Intake/Output from previous day: I/O last 3 completed shifts: In: 1717.9 [P.O.:120; I.V.:1597.9] Out: 325 [Urine:325]   Intake/Output this shift: Total I/O In: 138.4 [I.V.:138.4] Out: 150 [Urine:150]   LABORATORY DATA: Recent Labs    04/12/18 1032 04/13/18 0236  WBC 46.7* 33.0*  HGB 8.5* 7.0*  HCT 29.0* 24.1*  PLT 2,234* 1,803*  NA 140 143  K 4.7 4.4  CL 103 111  CO2 24 25  BUN 38* 32*  CREATININE 2.48* 1.18*  GLUCOSE 139* 97  CALCIUM 8.9 8.4*    Examination: Neurologically intact ABD soft Neurovascular intact Sensation intact distally Intact pulses distally Dorsiflexion/Plantar flexion intact Incision: dressing C/D/I No cellulitis present Compartment soft} XR AP&Lat of hip shows well placed\fixed THA Swelling to the right hip has diminished mildly compared to yesterday the skin is soft there is no drainage from where I aspirated 220 cc of bloody fluid from the subcutaneous tissue around her hip that was revised by me 7 weeks ago.  Assessment:      Status post revision total hip 02/04/2018.  Patient did have significant postoperative drainage from her tri-clonal polycythemia vera with platelets that did not provide much in the way of hemostasis.  Did well until 5 days ago when her right hip swelled up I aspirated  220 cc of fluid from her hip cultures are negative now at 48 hours.  She is never had a fever.  She always has a white count of 30-40,000.  She was admitted yesterday with a UTI, probable dehydration and a creatinine of 2.4.  Patient's hemoglobin today was 7 after rehydration I agree with giving her 1 to 2 units of packed red blood cells to increase her hemoglobin.  Plan: Cultures remain negative and she is afebrile at this time no surgery is entertained.  We will monitor her closely if the swelling in her hip increases she is certainly a candidate for having her hip washed out.  If it does come to surgery she will need to be given single donor platelets prior to surgery and DDAVP after surgery.  Again, no surgery is planned for now.

## 2018-04-13 NOTE — Progress Notes (Addendum)
[facility] PROGRESS NOTE    Progress Note  Alejandra [name]  CBJ:[id number] DOB: 1941-08-09 DOA: 04/12/2018 PCP: Vincente Liberty, MD     Brief Narrative:   Alejandra [name] is an 77 y.o. 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  She was aspirated on 04/12/2018 with 220 cc of body fluid culture data has remained negative and she has remained afebrile Orthopedic surgery evaluated the patient the recommended to consult oncology and recommended a transfusions of platelets, if surgical intervention was needed. Her hemoglobin dropped from 8.5-7.0 we will go ahead and give her 2 units of packed red blood cell check a CBC posttransfusion all. She has remained afebrile and her leukocytosis and thrombocytosis is improving.  Continue to hold antibiotics.  Possible UTI: Culture data is pending she was started empirically on IV Rocephin has remained afebrile.  Essential Hypertension: Antihypertensive medications were held. Stable.  Elevated troponins: She denies any chest pain or shortness of breath biomarkers have remained flat. 2D echo is pending.  Likely due to trauma.  Myeloproliferative disorder polycythemia vera/thrombocytosis: Her hemoglobin seems to be at baseline we will continue to trend hemoglobin. He has thrombocytosis which is likely reactive from fall, and she seems to have coagulation problem hematology was consulted recommended unit of platelet transfusion and DDAVP of going to surgery.   Sepsis ([place])  AKI (acute kidney injury) ([place]) Likely due to hypotension with a baseline creatinine of 10 1 she was started on gentle IV fluid hydration, creatinine is improving nicely.  Continue IV fluid hydration for the next 24  hours repeat a basic metabolic panel in the morning.     DVT prophylaxis: SCD Family Communication:none Disposition Plan/Barrier to D/C: unable to determine Code Status:     Code Status Orders  (From admission, onward)         Start     Ordered   04/12/18 1433  Full code  Continuous     04/12/18 1432        Code Status History    Date Active Date Inactive Code Status Order ID Comments User Context   02/04/2018 1824 02/12/2018 1820 Full Code [id number]  Neldon Newport Inpatient   09/28/2014 0136 09/29/2014 1602 Partial Code [id number]  Ivor Costa, MD Inpatient   10/12/2011 1510 10/13/2011 0054 Full Code [id number]  Johnny Bridge, MD Inpatient    Advance Directive Documentation     Most Recent Value  Type of Advance Directive  Healthcare Power of [place], Living will  Pre-existing out of facility DNR order (yellow form or pink MOST form)  -  "MOST" Form in Place?  -        IV Access:    Peripheral IV   Procedures and diagnostic studies:   Dg Chest Port 1 View  Result Date: 04/12/2018 CLINICAL DATA:  Weakness and shortness of breath. EXAM: PORTABLE CHEST 1 VIEW COMPARISON:  Two-view chest x-ray 01/29/2018 FINDINGS: Heart is enlarged. There is no edema or effusion. Aortic atherosclerosis is present. Changes of COPD are again noted. Advanced degenerative changes are present at both shoulders. IMPRESSION: 1. Stable cardiomegaly without failure. 2. Aortic atherosclerosis. 3. Advanced degenerative changes involve the shoulders bilaterally. Electronically Signed   By: Wynetta Fines.D.  On: 04/12/2018 11:28   Dg Hip Unilat  With Pelvis 2-3 Views Right  Result Date: 04/12/2018 CLINICAL DATA:  Fall.  History of hip replacement EXAM: DG HIP (WITH OR WITHOUT PELVIS) 2-3V RIGHT COMPARISON:  Fluoroscopy from 02/04/2018 FINDINGS: Total right hip arthroplasty with recent revision. The prosthesis appears well seated. No periprosthetic fracture or dislocation. Remote trauma to  the right obturator ring. No evidence of acute pelvic ring fracture. There is gas lateral to the right hip, status post incision drainage yesterday per report. Advanced left hip osteoarthritis. IMPRESSION: No acute finding. Electronically Signed   By: Monte Fantasia M.D.   On: 04/12/2018 10:48     Medical Consultants:    None.  Anti-Infectives:   None  Subjective:    [name] no complaints.  Objective:    Vitals:   04/12/18 1330 04/12/18 1412 04/12/18 2050 04/13/18 0451  BP: 123/70 127/77 124/70 124/81  Pulse: 97 73 89 95  Resp: 17 16 16 12   Temp:  (!) 97.5 F (36.4 C) 98 F (36.7 C) 98.8 F (37.1 C)  TempSrc:  Oral Oral Oral  SpO2: 100% (!) 85% 100% 100%  Weight:  53.4 kg    Height:  5\' 4"  (1.626 m)      Intake/Output Summary (Last 24 hours) at 04/13/2018 0733 Last data filed at 04/13/2018 0733 Gross per 24 hour  Intake 1717.86 ml  Output 425 ml  Net 1292.86 ml   Filed Weights   04/12/18 0952 04/12/18 1412  Weight: 51.3 kg 53.4 kg    Exam: General exam: In no acute distress. Respiratory system: Good air movement and clear to auscultation. Cardiovascular system: S1 & S2 heard, RRR.  Gastrointestinal system: Abdomen is nondistended, soft and nontender.  Central nervous system: Alert and oriented. No focal neurological deficits. Extremities: No pedal edema. Skin: Hematoma of the right hip not swollen but tender to palpation Psychiatry: Judgement and insight appear normal. Mood & affect appropriate.    Data Reviewed:    Labs: Basic Metabolic Panel: Recent Labs  Lab 04/12/18 1032 04/13/18 0236  NA 140 143  K 4.7 4.4  CL 103 111  CO2 24 25  GLUCOSE 139* 97  BUN 38* 32*  CREATININE 2.48* 1.18*  CALCIUM 8.9 8.4*   GFR Estimated Creatinine Clearance: 33.1 mL/min (A) (by C-G formula based on SCr of 1.18 mg/dL (H)). Liver Function Tests: Recent Labs  Lab 04/12/18 1032  AST 21  ALT 13  ALKPHOS 104  BILITOT 0.8  PROT 7.0  ALBUMIN 3.1*    No results for input(s): LIPASE, AMYLASE in the last 168 hours. No results for input(s): AMMONIA in the last 168 hours. Coagulation profile No results for input(s): INR, PROTIME in the last 168 hours.  CBC: Recent Labs  Lab 04/12/18 1032 04/13/18 0236  WBC 46.7* 33.0*  NEUTROABS 43.3  --   HGB 8.5* 7.0*  HCT 29.0* 24.1*  MCV 74.6* 75.8*  PLT 2,234* 1,803*   Cardiac Enzymes: Recent Labs  Lab 04/12/18 1032 04/12/18 1045 04/12/18 1518 04/12/18 2002 04/13/18 0236  CKTOTAL  --  91  --   --   --   TROPONINI 0.08*  --  0.10* 0.08* 0.06*   BNP (last 3 results) No results for input(s): PROBNP in the last 8760 hours. CBG: No results for input(s): GLUCAP in the last 168 hours. D-Dimer: No results for input(s): DDIMER in the last 72 hours. Hgb A1c: No results for input(s): HGBA1C in the last 72 hours. Lipid  Profile: No results for input(s): CHOL, HDL, LDLCALC, TRIG, CHOLHDL, LDLDIRECT in the last 72 hours. Thyroid function studies: No results for input(s): TSH, T4TOTAL, T3FREE, THYROIDAB in the last 72 hours.  Invalid input(s): FREET3 Anemia work up: No results for input(s): VITAMINB12, FOLATE, FERRITIN, TIBC, IRON, RETICCTPCT in the last 72 hours. Sepsis Labs: Recent Labs  Lab 04/12/18 1032 04/12/18 1100 04/12/18 1341 04/13/18 0236  WBC 46.7*  --   --  33.0*  LATICACIDVEN  --  1.36 0.91  --    Microbiology Recent Results (from the past 240 hour(s))  MRSA PCR Screening     Status: None   Collection Time: 04/12/18  6:21 PM  Result Value Ref Range Status   MRSA by PCR NEGATIVE NEGATIVE Final    Comment:        The GeneXpert MRSA Assay (FDA approved for NASAL specimens only), is one component of a comprehensive MRSA colonization surveillance program. It is not intended to diagnose MRSA infection nor to guide or monitor treatment for MRSA infections. Performed at [facility], [place] [address], [place], [place] [address]       Medications:   . hydroxyurea  1,000 mg Oral Once per day on Sun Tue Thu Sat  . hydroxyurea  500 mg Oral Once per day on Mon Wed Fri  . latanoprost  1 drop Both Eyes QHS   Continuous Infusions: . sodium chloride 100 mL/hr at 04/12/18 2305  . cefTRIAXone (ROCEPHIN)  IV        LOS: 1 day   [place] [facility] Pager [phone]  *Please refer to [email or web address], password [email or web address] to get updated schedule on who will round on this patient, as hospitalists switch teams weekly. If 7PM-7AM, please contact night-coverage at [email or web address], password [email or web address] for any overnight needs.  04/13/2018, 7:33 AM

## 2018-04-13 NOTE — Progress Notes (Signed)
Oral Chemotherapy Hold Policy - Hydrea  Hydroxyurea (Hydrea) hold criteria:  ANC < 2  Pltc < 80K in sickle-cell patients; < 100K in other patients  Hgb <= 6 in sickle-cell patients; < 8 in other patients  Reticulocytes < 80K when Hgb < 9  Labs: Hgb 7  A/P: patient on Hydrea for polycythemia vera; therefore per policy Hydrea will be held for Hgb < 8. Will continue to monitor  Adrian Saran, PharmD, BCPS Pager [phone] 04/13/2018 12:01 PM

## 2018-04-13 NOTE — Progress Notes (Signed)
Assumed care of patient at 1500 . Agree with previous Nurse assessment. Patient stable, awaiting blood transfusion once blood is ready in lab. Will continue to monitor.   Barbee Shropshire. Brigitte Pulse, RN

## 2018-04-13 NOTE — Plan of Care (Signed)

## 2018-04-13 NOTE — Progress Notes (Signed)
Temperature of 99.7 orally prior to blood transfusion. MD notified. Will continue to monitor.  Barbee Shropshire. Brigitte Pulse, RN

## 2018-04-14 LAB — CBC
HEMATOCRIT: 34.2 % — AB (ref 36.0–46.0)
Hemoglobin: 10.5 g/dL — ABNORMAL LOW (ref 12.0–15.0)
MCH: 24.1 pg — ABNORMAL LOW (ref 26.0–34.0)
MCHC: 30.7 g/dL (ref 30.0–36.0)
MCV: 78.6 fL (ref 78.0–100.0)
Platelets: 1808 10*3/uL (ref 150–400)
RBC: 4.35 MIL/uL (ref 3.87–5.11)
RDW: 23.4 % — AB (ref 11.5–15.5)
WBC: 40.8 10*3/uL — AB (ref 4.0–10.5)

## 2018-04-14 LAB — BASIC METABOLIC PANEL
ANION GAP: 7 (ref 5–15)
BUN: 17 mg/dL (ref 8–23)
CO2: 21 mmol/L — ABNORMAL LOW (ref 22–32)
Calcium: 8.2 mg/dL — ABNORMAL LOW (ref 8.9–10.3)
Chloride: 112 mmol/L — ABNORMAL HIGH (ref 98–111)
Creatinine, Ser: 0.76 mg/dL (ref 0.44–1.00)
Glucose, Bld: 128 mg/dL — ABNORMAL HIGH (ref 70–99)
POTASSIUM: 3.9 mmol/L (ref 3.5–5.1)
SODIUM: 140 mmol/L (ref 135–145)

## 2018-04-14 LAB — PREPARE RBC (CROSSMATCH)

## 2018-04-14 LAB — URINE CULTURE: Culture: >=100000 — AB

## 2018-04-14 MED ORDER — SODIUM CHLORIDE 0.9% IV SOLUTION
Freq: Once | INTRAVENOUS | Status: AC
  Administered 2018-04-14: 12:00:00 via INTRAVENOUS

## 2018-04-14 MED ORDER — HYDROXYUREA 500 MG PO CAPS: 1000.0000 mg | ORAL_CAPSULE | ORAL | Status: DC

## 2018-04-14 MED ORDER — HYDROXYUREA 500 MG PO CAPS: 500.0000 mg | ORAL_CAPSULE | ORAL | Status: DC

## 2018-04-14 MED ORDER — AMLODIPINE BESYLATE 10 MG PO TABS
10.0000 mg | ORAL_TABLET | Freq: Every day | ORAL | Status: DC
  Administered 2018-04-14 – 2018-04-17 (×4): 10 mg via ORAL
  Filled 2018-04-14 (×4): qty 1

## 2018-04-14 MED ORDER — LATANOPROST 0.005 % OP SOLN: 1.0000 [drp] | Freq: Every day | OPHTHALMIC | Status: DC

## 2018-04-14 MED ORDER — CEPHALEXIN 500 MG PO CAPS
500.0000 mg | ORAL_CAPSULE | Freq: Three times a day (TID) | ORAL | Status: DC
  Administered 2018-04-14 – 2018-04-17 (×10): 500 mg via ORAL
  Filled 2018-04-14 (×10): qty 1

## 2018-04-14 MED ORDER — BENAZEPRIL HCL 10 MG PO TABS
40.0000 mg | ORAL_TABLET | Freq: Every day | ORAL | Status: DC
  Administered 2018-04-14 – 2018-04-17 (×4): 40 mg via ORAL
  Filled 2018-04-14 (×4): qty 4

## 2018-04-14 NOTE — Progress Notes (Signed)
PT Cancellation Note  Patient Details Name: Alejandra [name] MRN: [id number] DOB: 04-27-1941   Cancelled Treatment:    Reason Eval/Treat Not Completed: Medical issues which prohibited therapy.  Pt has some very high labs and will ck later as time and pt allow.   Ramond Dial 04/14/2018, 9:45 AM   Mee Hives, PT MS Acute Rehab Dept. Number: [place] and [place]

## 2018-04-14 NOTE — Progress Notes (Signed)
Subjective: Denies frank dizziness or lightheadedness.  States that her hip feels "tight "she has not been up out of bed yet.  Objective: Vital signs in last 24 hours: Temp:  [98.2 F (36.8 C)-100 F (37.8 C)] 98.2 F (36.8 C) (09/01 0515) Pulse Rate:  [88-96] 89 (09/01 0700) Resp:  [17-18] 18 (09/01 0515) BP: (105-170)/(59-97) 125/94 (09/01 0700) SpO2:  [98 %-100 %] 100 % (09/01 0515)  Intake/Output from previous day: 08/31 0701 - 09/01 0700 In: 3517.8 [P.O.:480; I.V.:2375.8; Blood:462; IV Piggyback:200] Out: 1100 [Urine:1100] Intake/Output this shift: Total I/O In: -  Out: 200 [Urine:200]  Recent Labs    04/12/18 1032 04/13/18 0236 04/13/18 2315  HGB 8.5* 7.0* 7.6*   Recent Labs    04/13/18 0236 04/13/18 2315  WBC 33.0* 30.6*  RBC 3.18* 3.26*  HCT 24.1* 25.7*  PLT 1,803* 1,345*   Recent Labs    04/12/18 1032 04/13/18 0236  NA 140 143  K 4.7 4.4  CL 103 111  CO2 24 25  BUN 38* 32*  CREATININE 2.48* 1.18*  GLUCOSE 139* 97  CALCIUM 8.9 8.4*  Cultures in office negative right hip fluid. No results for input(s): LABPT, INR in the last 72 hours. Right hip exam: Incision benign.  She still has significant swelling secondary to hematoma.  No redness.  Moderate discomfort with range of motion.  Right calf is soft and nontender.   Assessment/Plan: Hematoma/seroma right hip status post right total hip revision. Blood loss anemia.  Asymptomatic today.  She has not been up. Plan: Continue to ice the right hip.  Up with physical therapy.  If she has symptomatic dizziness lightheadedness when up may consider giving 2 units of packed RBCs. If right hip swelling is not improved tomorrow we will consider aspirating the right hip at the bedside.   Erlene Senters 04/14/2018, 9:57 AM

## 2018-04-14 NOTE — Progress Notes (Signed)
[facility] PROGRESS NOTE    Progress Note  Alejandra [name]  MWN:[id number] DOB: 1940/11/01 DOA: 04/12/2018 PCP: Vincente Liberty, MD     Brief Narrative:   Alejandra [name] is an 77 y.o. 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  She was aspirated on 04/12/2018 with 220 cc of body fluid culture data has remained negative and she has remained afebrile Appreciate orthopedic surgery's assistance. She has remained afebrile and her leukocytosis and thrombocytosis is improving.    Normocytic anemia: With 1 unit of packed red blood cells her hemoglobin went to 7.6 we will transfuse an additional 1 unit of packed red blood cells recheck CBC post transfusional.  Possible UTI: Urine cultures show greater than 100,000 colonies of E. coli sensitive to Keflex we will change her to oral Keflex.  Essential Hypertension: Resume home antihypertensive medication blood pressure has been running high.  Elevated troponins: She denies any chest pain or shortness of breath biomarkers have remained flat. 2D echo is pending.  Likely due to trauma.  Myeloproliferative disorder polycythemia vera/thrombocytosis: He has thrombocytosis which is likely reactive from fall, and she seems to have coagulation problem Platelets continue to improve, her sudden rise of 2 million neck probably be reactive.  AKI (acute kidney injury) ([place]) Prerenal in etiology creatinine is close to baseline, can resume ACE inhibitor.  Basic metabolic panel is pending     DVT prophylaxis: SCD Family Communication:none Disposition Plan/Barrier to D/C: unable to determine Code Status:     Code Status Orders  (From admission, onward)         Start     Ordered   04/12/18 1433  Full code  Continuous     04/12/18 1432        Code Status History    Date Active Date Inactive Code Status Order ID Comments User Context   02/04/2018 1824 02/12/2018 1820 Full Code [id number]  Neldon Newport Inpatient   09/28/2014 0136 09/29/2014 1602 Partial Code [id number]  Ivor Costa, MD Inpatient   10/12/2011 1510 10/13/2011 0054 Full Code [id number]  Johnny Bridge, MD Inpatient    Advance Directive Documentation     Most Recent Value  Type of Advance Directive  Healthcare Power of [place], Living will  Pre-existing out of facility DNR order (yellow form or pink MOST form)  -  "MOST" Form in Place?  -        IV Access:    Peripheral IV   Procedures and diagnostic studies:   Dg Chest Port 1 View  Result Date: 04/12/2018 CLINICAL DATA:  Weakness and shortness of breath. EXAM: PORTABLE CHEST 1 VIEW COMPARISON:  Two-view chest x-ray 01/29/2018 FINDINGS: Heart is enlarged. There is no edema or effusion. Aortic atherosclerosis is present. Changes of COPD are again noted. Advanced degenerative changes are present at both shoulders. IMPRESSION: 1. Stable cardiomegaly without failure. 2. Aortic atherosclerosis. 3. Advanced degenerative changes involve the shoulders bilaterally. Electronically Signed   By: San Morelle M.D.   On: 04/12/2018 11:28   Dg Hip Unilat  With Pelvis 2-3 Views Right  Result Date: 04/12/2018 CLINICAL DATA:  Fall.  History of hip replacement EXAM: DG HIP (WITH OR WITHOUT PELVIS) 2-3V RIGHT COMPARISON:  Fluoroscopy from 02/04/2018 FINDINGS:  Total right hip arthroplasty with recent revision. The prosthesis appears well seated. No periprosthetic fracture or dislocation. Remote trauma to the right obturator ring. No evidence of acute pelvic ring fracture. There is gas lateral to the right hip, status post incision drainage yesterday per report. Advanced left hip osteoarthritis. IMPRESSION: No acute finding. Electronically Signed   By: Monte Fantasia M.D.   On: 04/12/2018 10:48     Medical Consultants:    None.  Anti-Infectives:   None  Subjective:    Alejandra [name] no complaints.  Objective:    Vitals:   04/13/18 1751 04/13/18 2051 04/14/18 0515 04/14/18 0700  BP:  (!) 148/88 (!) 170/97 (!) 125/94  Pulse:  96 91 89  Resp:  18 18   Temp: 99.4 F (37.4 C) 99 F (37.2 C) 98.2 F (36.8 C)   TempSrc: Oral Oral Oral   SpO2:  98% 100%   Weight:      Height:        Intake/Output Summary (Last 24 hours) at 04/14/2018 0950 Last data filed at 04/14/2018 0829 Gross per 24 hour  Intake 3379.4 ml  Output 1150 ml  Net 2229.4 ml   Filed Weights   04/12/18 0952 04/12/18 1412  Weight: 51.3 kg 53.4 kg    Exam: General exam: In no acute distress. Respiratory system: Good air movement and clear to auscultation. Cardiovascular system: S1 & S2 heard, RRR.  Gastrointestinal system: Abdomen is nondistended, soft and nontender.  Central nervous system: Alert and oriented. No focal neurological deficits. Extremities: No pedal edema. Skin: Hematoma of the right hip not swollen but tender to palpation Psychiatry: Judgement and insight appear normal. Mood & affect appropriate.    Data Reviewed:    Labs: Basic Metabolic Panel: Recent Labs  Lab 04/12/18 1032 04/13/18 0236  NA 140 143  K 4.7 4.4  CL 103 111  CO2 24 25  GLUCOSE 139* 97  BUN 38* 32*  CREATININE 2.48* 1.18*  CALCIUM 8.9 8.4*   GFR Estimated Creatinine Clearance: 33.1 mL/min (A) (by C-G formula based on SCr of 1.18 mg/dL (H)). Liver Function Tests: Recent Labs  Lab 04/12/18 1032  AST 21  ALT 13  ALKPHOS 104  BILITOT 0.8  PROT 7.0  ALBUMIN 3.1*   No results for input(s): LIPASE, AMYLASE in the last 168 hours. No results for input(s): AMMONIA in the last 168 hours. Coagulation profile No results for input(s): INR, PROTIME in the last 168 hours.  CBC: Recent Labs  Lab 04/12/18 1032 04/13/18 0236 04/13/18 2315  WBC 46.7* 33.0* 30.6*    NEUTROABS 43.3  --   --   HGB 8.5* 7.0* 7.6*  HCT 29.0* 24.1* 25.7*  MCV 74.6* 75.8* 78.8  PLT 2,234* 1,803* 1,345*   Cardiac Enzymes: Recent Labs  Lab 04/12/18 1032 04/12/18 1045 04/12/18 1518 04/12/18 2002 04/13/18 0236  CKTOTAL  --  91  --   --   --   TROPONINI 0.08*  --  0.10* 0.08* 0.06*   BNP (last 3 results) No results for input(s): PROBNP in the last 8760 hours. CBG: No results for input(s): GLUCAP in the last 168 hours. D-Dimer: No results for input(s): DDIMER in the last 72 hours. Hgb A1c: No results for input(s): HGBA1C in the last 72 hours. Lipid Profile: No results for input(s): CHOL, HDL, LDLCALC, TRIG, CHOLHDL, LDLDIRECT in the last 72 hours. Thyroid function studies: No results for input(s): TSH, T4TOTAL, T3FREE, THYROIDAB in the last 72 hours.  Invalid  input(s): FREET3 Anemia work up: No results for input(s): VITAMINB12, FOLATE, FERRITIN, TIBC, IRON, RETICCTPCT in the last 72 hours. Sepsis Labs: Recent Labs  Lab 04/12/18 1032 04/12/18 1100 04/12/18 1341 04/13/18 0236 04/13/18 2315  WBC 46.7*  --   --  33.0* 30.6*  LATICACIDVEN  --  1.36 0.91  --   --    Microbiology Recent Results (from the past 240 hour(s))  Urine Culture     Status: Abnormal   Collection Time: 04/12/18 11:15 AM  Result Value Ref Range Status   Specimen Description   Final    URINE, CATHETERIZED Performed at [facility], [place] [address], [place], [place] [address]    Special Requests   Final    NONE Performed at [facility], [place] [address], [place], [place] [address]    Culture >=100,000 COLONIES/mL ESCHERICHIA COLI (A)  Final   Report Status 04/14/2018 FINAL  Final   Organism ID, Bacteria ESCHERICHIA COLI (A)  Final      Susceptibility   Escherichia coli - MIC*    AMPICILLIN <=2 SENSITIVE Sensitive     CEFAZOLIN <=4 SENSITIVE Sensitive     CEFTRIAXONE <=1 SENSITIVE Sensitive     CIPROFLOXACIN <=0.25 SENSITIVE Sensitive      GENTAMICIN <=1 SENSITIVE Sensitive     IMIPENEM <=0.25 SENSITIVE Sensitive     NITROFURANTOIN <=16 SENSITIVE Sensitive     TRIMETH/SULFA <=20 SENSITIVE Sensitive     AMPICILLIN/SULBACTAM <=2 SENSITIVE Sensitive     PIP/TAZO <=4 SENSITIVE Sensitive     Extended ESBL NEGATIVE Sensitive     * >=100,000 COLONIES/mL ESCHERICHIA COLI  MRSA PCR Screening     Status: None   Collection Time: 04/12/18  6:21 PM  Result Value Ref Range Status   MRSA by PCR NEGATIVE NEGATIVE Final    Comment:        The GeneXpert MRSA Assay (FDA approved for NASAL specimens only), is one component of a comprehensive MRSA colonization surveillance program. It is not intended to diagnose MRSA infection nor to guide or monitor treatment for MRSA infections. Performed at [facility], [place] [address], [place], [place] [address]      Medications:   . latanoprost  1 drop Both Eyes QHS   Continuous Infusions: . sodium chloride 100 mL/hr at 04/14/18 0051  . cefTRIAXone (ROCEPHIN)  IV Stopped (04/13/18 1227)      LOS: 2 days   [place] [facility] Pager [phone]  *Please refer to [email or web address], password [email or web address] to get updated schedule on who will round on this patient, as hospitalists switch teams weekly. If 7PM-7AM, please contact night-coverage at [email or web address], password [email or web address] for any overnight needs.  04/14/2018, 9:50 AM

## 2018-04-14 NOTE — Progress Notes (Signed)
CBC obtained post 1 unit of blood. Pt's hgb up to 7.6. On call provider made aware. No new orders at this time. Will continue to monitor closely.

## 2018-04-14 NOTE — Progress Notes (Signed)
PHARMACY BRIEF NOTE: HYDROXYUREA   By [facility] policy, hydroxyurea is automatically held when any of the following laboratory values occur:  ANC < 2 K  Pltc < 80K in sickle-cell patients; < 100K in other patients  Hgb <= 6 in sickle-cell patients; < 8 in other patients  Reticulocytes < 80K when Hgb < 9  Hydroxyurea has been held (discontinued from profile) per policy.   Lenis Noon, PharmD 04/14/18 10:10 AM

## 2018-04-15 DIAGNOSIS — S7001XA Contusion of right hip, initial encounter: Secondary | ICD-10-CM | POA: Diagnosis present

## 2018-04-15 DIAGNOSIS — R319 Hematuria, unspecified: Secondary | ICD-10-CM

## 2018-04-15 LAB — BASIC METABOLIC PANEL
ANION GAP: 7 (ref 5–15)
BUN: 9 mg/dL (ref 8–23)
CHLORIDE: 110 mmol/L (ref 98–111)
CO2: 25 mmol/L (ref 22–32)
Calcium: 9.2 mg/dL (ref 8.9–10.3)
Creatinine, Ser: 0.6 mg/dL (ref 0.44–1.00)
GFR calc Af Amer: >60 mL/min (ref >60)
GFR calc non Af Amer: >60 mL/min (ref >60)
Glucose, Bld: 132 mg/dL — ABNORMAL HIGH (ref 70–99)
POTASSIUM: 3.8 mmol/L (ref 3.5–5.1)
Sodium: 142 mmol/L (ref 135–145)

## 2018-04-15 LAB — CBC
HCT: 34.1 % — ABNORMAL LOW (ref 36.0–46.0)
HEMOGLOBIN: 10.6 g/dL — AB (ref 12.0–15.0)
MCH: 24.1 pg — ABNORMAL LOW (ref 26.0–34.0)
MCHC: 31.1 g/dL (ref 30.0–36.0)
MCV: 77.5 fL — ABNORMAL LOW (ref 78.0–100.0)
Platelets: 1629 10*3/uL (ref 150–400)
RBC: 4.4 MIL/uL (ref 3.87–5.11)
RDW: 23.2 % — ABNORMAL HIGH (ref 11.5–15.5)
WBC: 39 10*3/uL — AB (ref 4.0–10.5)

## 2018-04-15 MED ORDER — CEPHALEXIN 500 MG PO CAPS: 500.0000 mg | ORAL_CAPSULE | Freq: Three times a day (TID) | ORAL | 0 refills | Status: DC

## 2018-04-15 NOTE — Care Management Note (Signed)
Case Management Note  Patient Details  Name: Alejandra [name] MRN: [id number] Date of Birth: [birth year]  Subjective/Objective: Received call back from [place] nurse-confimred patient active w/HHRN/PT/OT. Will resume HHC.                   Action/Plan:d/c home w/HHC.   Expected Discharge Date:  04/15/18               Expected Discharge Plan:  [place]  In-House Referral:     Discharge planning Services  CM Consult  Post Acute Care Choice:  Durable Medical Equipment, Home Health(rw,cane;actuve w/[facility]-HHRN/PT/OT) Choice offered to:  Patient  DME Arranged:    DME Agency:     HH Arranged:  RN, PT, OT HH Agency:     Status of Service:  Completed, signed off  If discussed at [place] of Stay Meetings, dates discussed:    Additional Comments:  Dessa Phi, RN 04/15/2018, 11:43 AM

## 2018-04-15 NOTE — Evaluation (Signed)
Physical Therapy Evaluation Patient Details Name: Alejandra [name] MRN: [id number] DOB: 20-Mar-1941 Today's Date: 04/15/2018   History of Present Illness  Pt admitted 04/12/18 for fall from bed and landing on R operative hip. Pt had R THA revision posterior approach on 02/04/18. PMH includes arthritis, avascular necrosis of R femoral head, cellulitis, fx of R hip 2013, glaucoma, HTN, rhabdo 2016, thrombocytosis, failed THA 02/01/18, R THA revision 02/04/18 posterior approach.   Clinical Impression   Pt admitted to hospital on 8/30 due to fall at home on R operative hip. Pt presents with decreased knowledge of precautions, impaired cognition on eval today, difficulty performing bed mobility/transfers/ambulation, and decreased activity tolerance for ambulation. PT recommending SNF following d/c for pt safety, as pt lives alone and does not have assist. PT to continue to follow acutely while in the hospital.     Follow Up Recommendations SNF;Supervision for mobility/OOB    Equipment Recommendations  None recommended by PT    Recommendations for Other Services       Precautions / Restrictions Precautions Precautions: Fall;Posterior Hip Precaution Comments: followed R posterior hip precautions during session, pt able to state 1/3 precautions.       Mobility  Bed Mobility Overal bed mobility: Needs Assistance Bed Mobility: Supine to Sit     Supine to sit: Mod assist     General bed mobility comments: Mod assist for scooting to EOB and for RLE management. Verbal cuing requries steadying at EOB. Bedding was soaked due to urinary incotinence.   Transfers Overall transfer level: Needs assistance Equipment used: Rolling walker (2 wheeled) Transfers: Sit to/from Stand Sit to Stand: Min assist;From elevated surface         General transfer comment: Min assist for power up and steadying upon standing.   Ambulation/Gait Ambulation/Gait assistance: Min assist Gait Distance (Feet): 25  Feet Assistive device: Rolling walker (2 wheeled) Gait Pattern/deviations: Step-to pattern;Decreased stride length;Antalgic;Decreased weight shift to right;Decreased stance time - right Gait velocity: decr   General Gait Details: Min assist for steadying, maintaining precautions. Pt with increased time and effort for gait today.   Stairs            Wheelchair Mobility    Modified Rankin (Stroke Patients Only)       Balance Overall balance assessment: Needs assistance Sitting-balance support: Bilateral upper extremity supported;Feet supported Sitting balance-Leahy Scale: Fair Sitting balance - Comments: Can sit without support, but does not choose to sit EOB without UE support due to pt comfort.    Standing balance support: Bilateral upper extremity supported Standing balance-Leahy Scale: Poor Standing balance comment: relies on RW for steadying and support                              Pertinent Vitals/Pain Pain Assessment: Faces Faces Pain Scale: Hurts a little bit Pain Location: R hip  Pain Descriptors / Indicators: Sore(with movement) Pain Intervention(s): Limited activity within patient's tolerance;Monitored during session    [place] expects to be discharged to:: Private residence Living Arrangements: Alone Available Help at Discharge: ("just myself") Type of Home: House Home Access: Stairs to enter;Ramped entrance Entrance Stairs-Rails: Right Entrance Stairs-Number of Steps: 2 Home Layout: One level Home Equipment: Walker - 2 wheels;Cane - single point      Prior Function Level of Independence: Independent with assistive device(s)         Comments: pt states doing all ADLs without assistance, uses RW for  home ambulation.      Hand Dominance   Dominant Hand: Right    Extremity/Trunk Assessment   Upper Extremity Assessment Upper Extremity Assessment: Overall WFL for tasks assessed    Lower Extremity  Assessment Lower Extremity Assessment: Generalized weakness       Communication   Communication: HOH  Cognition Arousal/Alertness: Lethargic Behavior During Therapy: WFL for tasks assessed/performed Overall Cognitive Status: Impaired/Different from baseline Area of Impairment: Orientation;Safety/judgement                 Orientation Level: Place       Safety/Judgement: Decreased awareness of safety     General Comments: Pt states she is at home now, and proceeded to give me her address. Pt informed she is in the hospital. Pt also required mod verbal cuing to maintain hip precautions.       General Comments      Exercises     Assessment/Plan    PT Assessment Patient needs continued PT services  PT Problem List Decreased strength;Pain;Decreased cognition;Decreased activity tolerance;Decreased knowledge of use of DME;Decreased balance;Decreased safety awareness;Decreased mobility;Decreased knowledge of precautions       PT Treatment Interventions DME instruction;Therapeutic activities;Gait training;Therapeutic exercise;Patient/family education;Balance training;Functional mobility training    PT Goals (Current goals can be found in the Care Plan section)  Acute Rehab PT Goals PT Goal Formulation: With patient Time For Goal Achievement: 04/29/18 Potential to Achieve Goals: Good    Frequency Min 3X/week   Barriers to discharge        Co-evaluation               AM-PAC PT "6 Clicks" Daily Activity  Outcome Measure Difficulty turning over in bed (including adjusting bedclothes, sheets and blankets)?: Unable Difficulty moving from lying on back to sitting on the side of the bed? : Unable Difficulty sitting down on and standing up from a chair with arms (e.g., wheelchair, bedside commode, etc,.)?: Unable Help needed moving to and from a bed to chair (including a wheelchair)?: A Little Help needed walking in hospital room?: A Little Help needed climbing 3-5  steps with a railing? : A Little 6 Click Score: 12    End of Session Equipment Utilized During Treatment: Gait belt Activity Tolerance: Patient limited by lethargy Patient left: in chair;with chair alarm set;with call bell/phone within reach;with nursing/sitter in room Nurse Communication: Mobility status PT Visit Diagnosis: Other abnormalities of gait and mobility (R26.89);History of falling (Z91.81)    Time: 3810-1751 PT Time Calculation (min) (ACUTE ONLY): 28 min   Charges:   PT Evaluation $PT Eval Low Complexity: 1 Low PT Treatments $Gait Training: 8-22 mins        Rasheema Truluck Conception Chancy, PT, DPT  Pager # [phone]    Ellisa Devivo D Letrice Pollok 04/15/2018, 1:00 PM

## 2018-04-15 NOTE — Progress Notes (Signed)
IP PROGRESS NOTE  Subjective:   Alejandra [name] has not noted a change in the right thigh swelling.  No other bleeding.   Objective: Vital signs in last 24 hours: Blood pressure (!) 168/91, pulse (!) 103, temperature 100 F (37.8 C), resp. rate 18, height 5\' 4"  (1.626 m), weight 117 lb 12.8 oz (53.4 kg), SpO2 99 %.  Intake/Output from previous day: 09/01 0701 - 09/02 0700 In: 3137.7 [P.O.:1075; I.V.:1744.7; Blood:318] Out: 1300 [Urine:1300]  Physical Exam:  HEENT: No thrush or ulcers  Abdomen: No hepatosplenomegaly Extremities: No leg edema Musculoskeletal: Ecchymosis surrounding the right thigh dressing, old blood within the superior aspect of the dressing   Lab Results: Recent Labs    04/14/18 1709 04/15/18 0509  WBC 40.8* 39.0*  HGB 10.5* 10.6*  HCT 34.2* 34.1*  PLT 1,808* 1,629*    BMET Recent Labs    04/14/18 1014 04/15/18 0509  NA 140 142  K 3.9 3.8  CL 112* 110  CO2 21* 25  GLUCOSE 128* 132*  BUN 17 9  CREATININE 0.76 0.60  CALCIUM 8.2* 9.2    No results found for: CEA1  Studies/Results: No results found.  Medications: I have reviewed the patient's current medications.  Assessment/Plan: 1. Polycythemia vera on hydroxyurea. Last phlebotomy 09/08/2015  Hydroxyurea dose increased to 1500 mg daily 06/26/2016  Hydroxyurea changed to 1500 mg daily on Monday through Friday, 1000 mg on Saturday and Sunday beginning 09/11/2016  Hydroxyurea changed to 1500 mg Monday Wednesday and Friday and 1000 mg other days beginning 10/10/2016  Hydroxyurea changed to 1000 mg daily beginning 10/31/2016  Hydroxyurea placed on hold beginning 11/27/2016-she misunderstood our instructions and continued 1000 mg daily.  Hydroxyurea dose decreased to 500 mg daily beginning 01/01/2017  Hydroxyurea dose increased 06/05/2017  Hydroxyurea dose increased to 1000 mg daily except for 500 mg on Monday, Wednesday, and Friday, 11/01/2017 2. Hypertension, followed by Dr. Montez Morita.   3. History of hyperpigmentation at the hands. Question related to hydroxyurea. 4. Hip fracture February 2013. She underwent a bipolar hip arthroplasty on 04/23/2012. 5. Thrombocytosis. Most likely secondary to polycythemia vera and iron deficiency. 6. Hospitalization with left shoulder cellulitis February 2016. 7. Pelvic fracture after a fall 05/31/2015 8. Weight loss-etiology unclear; weight stable 12/16/2015 and06/15/2017 9. Right anterior buccal ulcer 03/09/2016 10. Revision of right hip arthroplasty 1610 complicated by intraoperative and postoperative bleeding requiring multiple red cell transfusions 11. Swelling at the right hip surgical site week of 04/08/2018- 220 cc bloody fluid aspirated from the right hip surgical site 04/11/2018   Alejandra [name] appears stable.  She was transfused 2 units of packed red blood cells over the weekend.  The right thigh swelling appears unchanged, but there is more skin discoloration surrounding the dressing.  She had a low-grade fever yesterday and again this morning, but no other sign of infection.  Recommendations: 1.  Decision on further aspiration of the right thigh wound per Dr. Mayer Camel 2.  Hold aspirin 3.  Continue antibiotics for the urinary tract infection 4.  [facility] for the week of 04/22/2018    LOS: 3 days   Betsy Coder, MD   04/15/2018, 8:11 AM

## 2018-04-15 NOTE — NC FL2 (Signed)
[place] MEDICAID FL2 LEVEL OF CARE SCREENING TOOL     IDENTIFICATION  Patient Name: Alejandra [name] Birthdate: 04/16/1941 Sex: female Admission Date (Current Location): 04/12/2018  [facility] and [place] Number:  Herbalist and Address:  [facility],  [place], [place]      Provider Number: [id number]  Attending Physician Name and Address:  Charlynne Cousins, MD  Relative Name and Phone Number:       Current Level of Care: Hospital Recommended Level of Care: [place] Prior Approval Number:    Date Approved/Denied:   PASRR Number: [id number]   Discharge Plan:      Current Diagnoses: Patient Active Problem List   Diagnosis Date Noted  . Hematoma of right hip 04/15/2018  . Right hip pain 04/13/2018  . Fall at home, initial encounter 04/13/2018  . AKI (acute kidney injury) ([place]) 04/13/2018  . Elevated troponin 04/13/2018  . Possible Acute lower UTI 04/13/2018  . Sepsis ([place]) 04/12/2018  . UTI (urinary tract infection) 04/12/2018  . Primary osteoarthritis of right hip 02/04/2018  . Failed total hip arthroplasty ([place]) 02/01/2018  . Pre-operative cardiovascular examination 01/31/2018  . Abnormal EKG 01/31/2018  . Vertigo 09/28/2014  . Essential hypertension 09/28/2014  . Thrombocytosis ([place]) 04/23/2012  . Polycythemia vera ([place]) 12/22/2011    Orientation RESPIRATION BLADDER Height & Weight     Time, Place, Situation  Normal(Heart Healthy) Incontinent Weight: 117 lb 12.8 oz (53.4 kg) Height:  5\' 4"  (162.6 cm)  BEHAVIORAL SYMPTOMS/MOOD NEUROLOGICAL BOWEL NUTRITION STATUS      Incontinent Diet  AMBULATORY STATUS COMMUNICATION OF NEEDS Skin   Limited Assist Verbally (Pt has a large bruise on the pt's right hip from a fall and ejre pt has had injections)                       Personal Care Assistance Level of Assistance  Bathing, Dressing Bathing Assistance: Limited assistance   Dressing Assistance:  Limited assistance     Functional Limitations Info             SPECIAL CARE FACTORS FREQUENCY  PT (By licensed PT), OT (By licensed OT)     PT Frequency: 5 OT Frequency: 5            Contractures Contractures Info: Not present    Additional Factors Info  Code Status, Allergies Code Status Info: FULL Allergies Info: No Known Allergies           Current Medications (04/15/2018):  This is the current hospital active medication list Current Facility-Administered Medications  Medication Dose Route Frequency Provider Last Rate Last Dose  . 0.9 %  sodium chloride infusion   Intravenous Continuous Rai, Ripudeep K, MD 100 mL/hr at 04/15/18 0300    . acetaminophen (TYLENOL) tablet 650 mg  650 mg Oral Q6H PRN Rai, Ripudeep K, MD   650 mg at 04/15/18 1048   Or  . acetaminophen (TYLENOL) suppository 650 mg  650 mg Rectal Q6H PRN Rai, Ripudeep K, MD      . ALPRAZolam Duanne Moron) tablet 0.25 mg  0.25 mg Oral BID PRN Rai, Ripudeep K, MD   0.25 mg at 04/14/18 2117  . amLODipine (NORVASC) tablet 10 mg  10 mg Oral Daily Charlynne Cousins, MD   10 mg at 04/15/18 1048  . benazepril (LOTENSIN) tablet 40 mg  40 mg Oral Daily Charlynne Cousins, MD   40 mg at 04/15/18  1048  . cephALEXin (KEFLEX) capsule 500 mg  500 mg Oral Q8H Charlynne Cousins, MD   500 mg at 04/15/18 1532  . latanoprost (XALATAN) 0.005 % ophthalmic solution 1 drop  1 drop Both Eyes QHS Rai, Ripudeep K, MD   1 drop at 04/14/18 2118  . loratadine (CLARITIN) tablet 10 mg  10 mg Oral Daily PRN Rai, Ripudeep K, MD      . meclizine (ANTIVERT) tablet 25 mg  25 mg Oral TID PRN Rai, Ripudeep K, MD      . ondansetron (ZOFRAN) tablet 4 mg  4 mg Oral Q6H PRN Rai, Ripudeep K, MD       Or  . ondansetron (ZOFRAN) injection 4 mg  4 mg Intravenous Q6H PRN Rai, Ripudeep K, MD      . oxyCODONE-acetaminophen (PERCOCET/ROXICET) 5-325 MG per tablet 1 tablet  1 tablet Oral Q4H PRN Rai, Ripudeep K, MD   1 tablet at 04/14/18 1256  . tiZANidine  (ZANAFLEX) tablet 2 mg  2 mg Oral Q6H PRN Rai, Ripudeep K, MD   2 mg at 04/13/18 4970     Discharge Medications: Please see discharge summary for a list of discharge medications.  Relevant Imaging Results:  Relevant Lab Results:   Additional Information [id number]  Alphonse Guild Nydia Ytuarte, LCSWA

## 2018-04-15 NOTE — Progress Notes (Signed)
CSW spoke to the pt who confirmed pt desired to seek admission to an SNF due to the fact that the pt lives alone and is unable to "get in and out of" her home due to the stairs at her home.    Pt granted verbal permission for the CSW DEPT to send out referrals via the hub on her behalf to SNF's in the [place] area , but asked that the CSW speak to the pt's sister and brother as "my sister and brother is in charge of everything so please speak to her".  CSW called and spoke to the pt's sister Rosa Martinique at ph: [phone] who stated that "the one place she doesn't want to go is [facility] and the [place] voiced understanding.  The pt's sister was appreciative and thanked the CSW.  CSW will continue to follow for D/C needs.  Alphonse Guild. Lizza Huffaker, LCSW, LCAS, CSI Clinical Social Worker Ph: [phone]

## 2018-04-15 NOTE — Clinical Social Work Note (Signed)
Clinical Social Work Assessment  Patient Details  Name: Alejandra [name] MRN: [id number] Date of Birth: 10/12/1940  Date of referral:  04/15/18               Reason for consult:  Facility Placement                Permission sought to share information with:  Facility Art therapist granted to share information::  Yes, Verbal Permission Granted  Name::        Agency::     Relationship::     Contact Information:     Housing/Transportation Living arrangements for the past 2 months:  Single Family Home Source of Information:  Patient, Siblings Patient Interpreter Needed:  None Criminal Activity/Legal Involvement Pertinent to Current Situation/Hospitalization:    Significant Relationships:  Siblings Lives with:  Self Do you feel safe going back to the place where you live?  No Need for family participation in patient care:  Yes (Comment)  Care giving concerns:  CSW spoke to the pt who confirmed pt desired to seek admission to an SNF due to the fact that the pt lives alone and is unable to "get in and out of" her home due to the stairs at her home.    Pt granted verbal permission for the CSW DEPT to send out referrals via the hub on her behalf to SNF's in the [place] area , but asked that the CSW speak to the pt's sister and brother as "my sister and brother is in charge of everything so please speak to her".  CSW called and spoke to the pt's sister Alejandra [name] at ph: [phone] who stated that "the one place she doesn't want to go is [place] and the [place] voiced understanding.  The pt's sister was appreciative and thanked the CSW.   Social Worker assessment / plan:  CSW met with pt and confirmed pt's plan to be discharged to a SNF if accepted for rehab at discharge.  CSW provided active listening and validated pt's concerns.   CSW was given permission to complete FL-2 and send referrals out to SNF facilities via the hub per pt's/pt's family's request.  Pt  has been living independently prior to being admitted to [facility]  Employment status:  Retired Nurse, adult PT Recommendations:  [place] / Referral to community resources:     Patient/Family's Response to care:  Patient alert and oriented.  Patient and pt's sister agreeable to plan.  Pt's sister supportive and strongly involved in pt.'s care.  Pt.'s ssiter and brother pleasant and appreciated CSW intervention.    Patient/Family's Understanding of and Emotional Response to Diagnosis, Current Treatment, and Prognosis:  Pt and family understand current prognosis and treatment.  Emotional Assessment Appearance:    Attitude/Demeanor/Rapport:    Affect (typically observed):  Accepting, Adaptable, Calm, Pleasant Orientation:  Oriented to Self, Oriented to Place, Oriented to  Time, Oriented to Situation Alcohol / Substance use:    Psych involvement (Current and /or in the community):     Discharge Needs  Concerns to be addressed:  No discharge needs identified Readmission within the last 30 days:  No Current discharge risk:  None Barriers to Discharge:  No Barriers Identified   Claudine Mouton, LCSWA 04/15/2018, 5:13 PM
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
## 2018-04-15 NOTE — Progress Notes (Signed)
Provider/RN was updated by the CSW.  Please reconsult if future social work needs arise.  CSW signing off, as social work intervention is no longer needed.  Alphonse Guild. Asalee Barrette, LCSW, LCAS, CSI Clinical Social Worker Ph: [phone]

## 2018-04-15 NOTE — Progress Notes (Addendum)
CSW informed by Physical Therapist that that the PT recommendation is for SNF.   CSW followed up with patient at bedside and discussed discharge planning and PT recommendation for SNF. Patient oriented x 4 and informed CSW that she was confused last night and wants to apologize to the man that was working with her. Patient elaborated stating she thought she was at home. CSW informed patient about PT recommendation for SNF, patient declined and reported that she prefers to return home with home health services. Patient reported that she feels safe returning home and that her sister will pick her up at discharge. Patient reported that she was recently discharged from [facility] 3 weeks ago and does not want to return to SNF at this time. Patient granted CSW verbal permission to contact her sister to discuss discharge planning.  CSW contacted patient's sister and inquired about her plans to transport patient home, patient's sister reported that she and her brother will pick up patient today. CSW agreed to notify patient's RN to contact patient's sister when patient is ready for discharge.  CSW updated patient's RN. Patient's RN agreed to contact patient's sister when patient is ready for transport.  Patient discharging home, patient's sister will transport. CSW signing off, no other needs identified at this time.  RNCM following for home health needs.  Abundio Miu, [place] Social Worker [facility] Cell#: [phone]

## 2018-04-15 NOTE — Progress Notes (Signed)
CSW received a call from pt's RN who is stating pt has reveresd herself and now is stating she does want to D/C to a SNF.  Per pt's RN, pt had stated originally that she wanted a SNF, stay and then had changed her mind before speaking to the CSW.  Pt informed the CSW that pt now did not want to go to a SNF.  CSW then received a call from pt's RN that after the pt's sister had said the pt would not be able to ascend nor descend the steps outside of her home that the pt would have to go to a SNF, and as a result the pt is now stating a second time she wants to go to a SNF.  2nd shift ED CSW will leave handoff for 1st shift CSW.  CSW will continue to follow for D/C needs.  Alejandra [name]. Alejandra Basnett, LCSW, LCAS, CSI Clinical Social Worker Ph: [phone]

## 2018-04-15 NOTE — Discharge Summary (Signed)
Physician Discharge Summary  Alejandra [name] YYQ:[id number] DOB: [birth year] DOA: 04/12/2018  PCP: Vincente Liberty, MD  Admit date: 04/12/2018 Discharge date: 04/15/2018  Admitted From: home Disposition:  Home  Recommendations for Outpatient Follow-up:  1. Follow up with Ortho in 1-2 weeks 2. Please obtain BMP/CBC in one week   Home Health:no Equipment/Devices:none  Discharge Condition:stable CODE STATUS:full Diet recommendation: Heart Healthy  Brief/Interim Summary: 77 y.o. 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, hypertension chronic thrombocytosis where she rolled off her bed and landed on her right hip  Discharge Diagnoses:  Active Problems:   Sepsis ([place])   UTI (urinary tract infection)   Right hip pain   Fall at home, initial encounter   AKI (acute kidney injury) ([place])   Elevated troponin   Possible Acute lower UTI  Right hip pain secondary to a fall at home with a right hip hematoma: Chest x-ray showed no fracture it was aspirated on 04/12/2018 with 220 cc of bloody fluid orthopedic surgery was consulted they recommended no further call surgical intervention, cultures from the fluid as per orthopedic surgery showed no signs of infection.  Normocytic anemia: She was transfused 2 units of packed red blood cells her hemoglobin remained stable.  Possible urinary tract infection: Urine culture grew more than 100,000 colonies of E. coli sensitive to Keflex which she will continue as an outpatient.  Essential hypertension: No changes were made to his medication.  Elevated troponins: She denies any chest pain shortness of breath cardiac biomarkers remain flat her 2D echo showed no wall motion abnormalities likely due to trauma.  Myeloproliferative disorder/polycythemia vera/thrombocytosis: Are trending down the elevation could be likely reactive. But her platelets remain at her baseline on the day of  discharge.  Acute kidney injury: Likely prerenal in etiology ACE was held and with IV fluid hydration her creatinine returned to baseline.  Discharge Instructions  Discharge Instructions    Diet - low sodium heart healthy   Complete by:  As directed    Increase activity slowly   Complete by:  As directed      Allergies as of 04/15/2018   No Known Allergies     Medication List    STOP taking these medications   QC ASPIRIN LOW DOSE 81 MG chewable tablet Generic drug:  aspirin     TAKE these medications   ALPRAZolam 0.25 MG tablet Commonly known as:  XANAX Take 0.25 mg by mouth 2 (two) times daily as needed for anxiety.   amLODipine 10 MG tablet Commonly known as:  NORVASC Take 10 mg by mouth daily.   atorvastatin 10 MG tablet Commonly known as:  LIPITOR Take 10 mg by mouth at bedtime.   benazepril 40 MG tablet Commonly known as:  LOTENSIN Take 40 mg by mouth daily.   cephALEXin 500 MG capsule Commonly known as:  KEFLEX Take 1 capsule (500 mg total) by mouth 3 (three) times daily.   hydroxyurea 500 MG capsule Commonly known as:  HYDREA Take 1 tablet (500mg ) every MWF. Take 2 tablets (1,000mg ) all other days What changed:    how much to take  how to take this  when to take this   latanoprost 0.005 % ophthalmic solution Commonly known as:  XALATAN Place 1 drop into both eyes at bedtime.   loratadine 10 MG tablet Commonly known as:  CLARITIN Take 10 mg by mouth daily as needed for allergies.   meclizine 25 MG tablet  Commonly known as:  ANTIVERT Take 25 mg by mouth 3 (three) times daily as needed for dizziness.   oxyCODONE-acetaminophen 5-325 MG tablet Commonly known as:  PERCOCET/ROXICET Take 1 tablet by mouth every 4 (four) hours as needed for severe pain.   SIMBRINZA 1-0.2 % Susp Generic drug:  Brinzolamide-Brimonidine Place 1 drop into both eyes 2 (two) times daily.   tiZANidine 2 MG tablet Commonly known as:  ZANAFLEX Take 1 tablet (2 mg  total) by mouth every 6 (six) hours as needed. What changed:  reasons to take this   TRAVATAN Z 0.004 % Soln ophthalmic solution Generic drug:  Travoprost (BAK Free) Place 1 drop into both eyes at bedtime.       No Known Allergies  Consultations:  Ortho  Oncology   Procedures/Studies: Dg Chest Port 1 View  Result Date: 04/12/2018 CLINICAL DATA:  Weakness and shortness of breath. EXAM: PORTABLE CHEST 1 VIEW COMPARISON:  Two-view chest x-ray 01/29/2018 FINDINGS: Heart is enlarged. There is no edema or effusion. Aortic atherosclerosis is present. Changes of COPD are again noted. Advanced degenerative changes are present at both shoulders. IMPRESSION: 1. Stable cardiomegaly without failure. 2. Aortic atherosclerosis. 3. Advanced degenerative changes involve the shoulders bilaterally. Electronically Signed   By: San Morelle M.D.   On: 04/12/2018 11:28   Dg Hip Unilat  With Pelvis 2-3 Views Right  Result Date: 04/12/2018 CLINICAL DATA:  Fall.  History of hip replacement EXAM: DG HIP (WITH OR WITHOUT PELVIS) 2-3V RIGHT COMPARISON:  Fluoroscopy from 02/04/2018 FINDINGS: Total right hip arthroplasty with recent revision. The prosthesis appears well seated. No periprosthetic fracture or dislocation. Remote trauma to the right obturator ring. No evidence of acute pelvic ring fracture. There is gas lateral to the right hip, status post incision drainage yesterday per report. Advanced left hip osteoarthritis. IMPRESSION: No acute finding. Electronically Signed   By: Monte Fantasia M.D.   On: 04/12/2018 10:48    Subjective: No new complaints.  Discharge Exam: Vitals:   04/14/18 2137 04/15/18 0601  BP: (!) 182/90 (!) 168/91  Pulse: (!) 101 (!) 103  Resp: 18 18  Temp: 99.7 F (37.6 C) 100 F (37.8 C)  SpO2: 100% 99%   Vitals:   04/14/18 1403 04/14/18 1447 04/14/18 2137 04/15/18 0601  BP: (!) 165/83 (!) 158/82 (!) 182/90 (!) 168/91  Pulse: 90 95 (!) 101 (!) 103  Resp: 20 20 18  18   Temp: 99 F (37.2 C) 99.8 F (37.7 C) 99.7 F (37.6 C) 100 F (37.8 C)  TempSrc: Oral Oral Oral   SpO2: 100% 95% 100% 99%  Weight:      Height:        General: Pt is alert, awake, not in acute distress Cardiovascular: RRR, S1/S2 +, no rubs, no gallops Respiratory: CTA bilaterally, no wheezing, no rhonchi Abdominal: Soft, NT, ND, bowel sounds + Extremities: no edema, no cyanosis    The results of significant diagnostics from this hospitalization (including imaging, microbiology, ancillary and laboratory) are listed below for reference.     Microbiology: Recent Results (from the past 240 hour(s))  Urine Culture     Status: Abnormal   Collection Time: 04/12/18 11:15 AM  Result Value Ref Range Status   Specimen Description   Final    URINE, CATHETERIZED Performed at [facility] [address], [place], [place] [address]    Special Requests   Final    NONE Performed at [facility], [place] [address]  [place], [place] [address]    Culture >=100,000 COLONIES/mL ESCHERICHIA COLI (A)  Final   Report Status 04/14/2018 FINAL  Final   Organism ID, Bacteria ESCHERICHIA COLI (A)  Final      Susceptibility   Escherichia coli - MIC*    AMPICILLIN <=2 SENSITIVE Sensitive     CEFAZOLIN <=4 SENSITIVE Sensitive     CEFTRIAXONE <=1 SENSITIVE Sensitive     CIPROFLOXACIN <=0.25 SENSITIVE Sensitive     GENTAMICIN <=1 SENSITIVE Sensitive     IMIPENEM <=0.25 SENSITIVE Sensitive     NITROFURANTOIN <=16 SENSITIVE Sensitive     TRIMETH/SULFA <=20 SENSITIVE Sensitive     AMPICILLIN/SULBACTAM <=2 SENSITIVE Sensitive     PIP/TAZO <=4 SENSITIVE Sensitive     Extended ESBL NEGATIVE Sensitive     * >=100,000 COLONIES/mL ESCHERICHIA COLI  MRSA PCR Screening     Status: None   Collection Time: 04/12/18  6:21 PM  Result Value Ref Range Status   MRSA by PCR NEGATIVE NEGATIVE Final    Comment:        The GeneXpert MRSA Assay (FDA approved for NASAL  specimens only), is one component of a comprehensive MRSA colonization surveillance program. It is not intended to diagnose MRSA infection nor to guide or monitor treatment for MRSA infections. Performed at [facility], [place] [address] [place], [place] [address]      Labs: BNP (last 3 results) No results for input(s): BNP in the last 8760 hours. Basic Metabolic Panel: Recent Labs  Lab 04/12/18 1032 04/13/18 0236 04/14/18 1014 04/15/18 0509  NA 140 143 140 142  K 4.7 4.4 3.9 3.8  CL 103 111 112* 110  CO2 24 25 21* 25  GLUCOSE 139* 97 128* 132*  BUN 38* 32* 17 9  CREATININE 2.48* 1.18* 0.76 0.60  CALCIUM 8.9 8.4* 8.2* 9.2   Liver Function Tests: Recent Labs  Lab 04/12/18 1032  AST 21  ALT 13  ALKPHOS 104  BILITOT 0.8  PROT 7.0  ALBUMIN 3.1*   No results for input(s): LIPASE, AMYLASE in the last 168 hours. No results for input(s): AMMONIA in the last 168 hours. CBC: Recent Labs  Lab 04/12/18 1032 04/13/18 0236 04/13/18 2315 04/14/18 1709 04/15/18 0509  WBC 46.7* 33.0* 30.6* 40.8* 39.0*  NEUTROABS 43.3  --   --   --   --   HGB 8.5* 7.0* 7.6* 10.5* 10.6*  HCT 29.0* 24.1* 25.7* 34.2* 34.1*  MCV 74.6* 75.8* 78.8 78.6 77.5*  PLT 2,234* 1,803* 1,345* 1,808* 1,629*   Cardiac Enzymes: Recent Labs  Lab 04/12/18 1032 04/12/18 1045 04/12/18 1518 04/12/18 2002 04/13/18 0236  CKTOTAL  --  91  --   --   --   TROPONINI 0.08*  --  0.10* 0.08* 0.06*   BNP: Invalid input(s): POCBNP CBG: No results for input(s): GLUCAP in the last 168 hours. D-Dimer No results for input(s): DDIMER in the last 72 hours. Hgb A1c No results for input(s): HGBA1C in the last 72 hours. Lipid Profile No results for input(s): CHOL, HDL, LDLCALC, TRIG, CHOLHDL, LDLDIRECT in the last 72 hours. Thyroid function studies No results for input(s): TSH, T4TOTAL, T3FREE, THYROIDAB in the last 72 hours.  Invalid input(s): FREET3 Anemia work up No results for input(s):  VITAMINB12, FOLATE, FERRITIN, TIBC, IRON, RETICCTPCT in the last 72 hours. Urinalysis    Component Value Date/Time   COLORURINE AMBER (A) 04/12/2018 1115   APPEARANCEUR CLOUDY (A) 04/12/2018 1115   LABSPEC 1.020 04/12/2018 1115  LABSPEC 1.020 08/29/2013 0928   PHURINE 5.0 04/12/2018 1115   GLUCOSEU NEGATIVE 04/12/2018 1115   GLUCOSEU Negative 08/29/2013 0928   HGBUR NEGATIVE 04/12/2018 1115   BILIRUBINUR NEGATIVE 04/12/2018 1115   BILIRUBINUR Negative 08/29/2013 0928   KETONESUR NEGATIVE 04/12/2018 1115   PROTEINUR 30 (A) 04/12/2018 1115   UROBILINOGEN 1.0 09/27/2014 2259   UROBILINOGEN 0.2 08/29/2013 0928   NITRITE NEGATIVE 04/12/2018 1115   LEUKOCYTESUR LARGE (A) 04/12/2018 1115   LEUKOCYTESUR Negative 08/29/2013 0928   Sepsis Labs Invalid input(s): PROCALCITONIN,  WBC,  LACTICIDVEN Microbiology Recent Results (from the past 240 hour(s))  Urine Culture     Status: Abnormal   Collection Time: 04/12/18 11:15 AM  Result Value Ref Range Status   Specimen Description   Final    URINE, CATHETERIZED Performed at [facility], [place] [address], [place], [place] [address]    Special Requests   Final    NONE Performed at [facility], [place] [address], [place], [place] [address]    Culture >=100,000 COLONIES/mL ESCHERICHIA COLI (A)  Final   Report Status 04/14/2018 FINAL  Final   Organism ID, Bacteria ESCHERICHIA COLI (A)  Final      Susceptibility   Escherichia coli - MIC*    AMPICILLIN <=2 SENSITIVE Sensitive     CEFAZOLIN <=4 SENSITIVE Sensitive     CEFTRIAXONE <=1 SENSITIVE Sensitive     CIPROFLOXACIN <=0.25 SENSITIVE Sensitive     GENTAMICIN <=1 SENSITIVE Sensitive     IMIPENEM <=0.25 SENSITIVE Sensitive     NITROFURANTOIN <=16 SENSITIVE Sensitive     TRIMETH/SULFA <=20 SENSITIVE Sensitive     AMPICILLIN/SULBACTAM <=2 SENSITIVE Sensitive     PIP/TAZO <=4 SENSITIVE Sensitive     Extended ESBL NEGATIVE Sensitive     * >=100,000  COLONIES/mL ESCHERICHIA COLI  MRSA PCR Screening     Status: None   Collection Time: 04/12/18  6:21 PM  Result Value Ref Range Status   MRSA by PCR NEGATIVE NEGATIVE Final    Comment:        The GeneXpert MRSA Assay (FDA approved for NASAL specimens only), is one component of a comprehensive MRSA colonization surveillance program. It is not intended to diagnose MRSA infection nor to guide or monitor treatment for MRSA infections. Performed at [facility], [place] [address], [place], [place] [address]      Time coordinating discharge: Over 30 minutes  SIGNED:   Charlynne Cousins, MD  [facility] 04/15/2018, 10:34 AM Pager   If 7PM-7AM, please contact night-coverage [email or web address] Password [email or web address]

## 2018-04-15 NOTE — Progress Notes (Addendum)
Subjective:    Patient reports pain as moderate.  Mostly complains of some tightness in her right hip.  No fever, chills, or sweats.  Still has moderate right hip swelling.  Objective: Vital signs in last 24 hours: Temp:  [97.4 F (36.3 C)-100 F (37.8 C)] 100 F (37.8 C) (09/02 0601) Pulse Rate:  [79-103] 103 (09/02 0601) Resp:  [16-20] 18 (09/02 0601) BP: (158-182)/(80-110) 168/91 (09/02 0601) SpO2:  [95 %-100 %] 99 % (09/02 0601)  Intake/Output from previous day: 09/01 0701 - 09/02 0700 In: 3137.7 [P.O.:1075; I.V.:1744.7; Blood:318] Out: 1300 [Urine:1300] Intake/Output this shift: No intake/output data recorded.  Recent Labs    04/13/18 0236 04/13/18 2315 04/14/18 1709 04/15/18 0509  HGB 7.0* 7.6* 10.5* 10.6*   Recent Labs    04/14/18 1709 04/15/18 0509  WBC 40.8* 39.0*  RBC 4.35 4.40  HCT 34.2* 34.1*  PLT 1,808* 1,629*   Recent Labs    04/14/18 1014 04/15/18 0509  NA 140 142  K 3.9 3.8  CL 112* 110  CO2 21* 25  BUN 17 9  CREATININE 0.76 0.60  GLUCOSE 128* 132*  CALCIUM 8.2* 9.2   No results for input(s): LABPT, INR in the last 72 hours.  Wound cultures right hip on 04/10/2018 were negative. Right hip exam: Devious surgical scars benign.  She has a fair amount of ecchymosis about the right hip somewhat posterior to the incision.  Minimal warmth.  She does have a persistent fluid collection.   Assessment/Plan:    Up with therapy Discharge to SNF Procedure: Sterile conditions after painting the right hip region copiously with Betadine I aspirated 50 cc of frank blood from the right hip.  No complications were encountered. A sterile dressing was applied. Continue to ice the right hip Okay for discharge to SNF whenever medically stable. Will need follow-up with Dr. Mayer Camel in 10 days. Weight-bear as tolerated on right.   Erlene Senters 04/15/2018, 11:29 AM

## 2018-04-15 NOTE — Care Management Note (Signed)
Case Management Note  Patient Details  Name: CALEEN TAAFFE MRN: [id number] Date of Birth: March 02, 1941  Subjective/Objective:  Patient states she is active w/[facility]-TC main office to confirm if active-await call back from nurse w/[facility]. Await PT cons & recc.                  Action/Plan:d/c plan home   Expected Discharge Date:  04/15/18               Expected Discharge Plan:  [place]  In-House Referral:     Discharge planning Services  CM Consult  Post Acute Care Choice:  (S) Durable Medical Equipment(rw,cane) Choice offered to:  Patient  DME Arranged:    DME Agency:     HH Arranged:    [place] Agency:     Status of Service:  In process, will continue to follow  If discussed at Long Length of Stay Meetings, dates discussed:    Additional Comments:  Dessa Phi, RN 04/15/2018, 11:34 AM

## 2018-04-16 ENCOUNTER — Other Ambulatory Visit: Payer: Self-pay | Admitting: Nurse Practitioner

## 2018-04-16 DIAGNOSIS — D45 Polycythemia vera: Secondary | ICD-10-CM

## 2018-04-16 LAB — PATHOLOGIST SMEAR REVIEW

## 2018-04-16 LAB — BASIC METABOLIC PANEL
Anion gap: 7 (ref 5–15)
BUN: 14 mg/dL (ref 8–23)
CO2: 28 mmol/L (ref 22–32)
CREATININE: 0.59 mg/dL (ref 0.44–1.00)
Calcium: 8.6 mg/dL — ABNORMAL LOW (ref 8.9–10.3)
Chloride: 105 mmol/L (ref 98–111)
GFR calc Af Amer: >60 mL/min (ref >60)
GLUCOSE: 102 mg/dL — AB (ref 70–99)
Potassium: 3.8 mmol/L (ref 3.5–5.1)
Sodium: 140 mmol/L (ref 135–145)

## 2018-04-16 NOTE — Clinical Social Work Placement (Signed)
Patient provided with bed offers and CSW requested SNF selection to start insurance authorization process. Patient reported that she was awaiting return call from to discuss SNF options. CSW explained importance of selecting SNF to initiate insurance authorization process, patient agreed to think about SNF options to make a selection. CSW awaing SNF selection, once SNF is selected [employer] authorization can be initiaited.   CLINICAL SOCIAL WORK PLACEMENT  NOTE  Date:  04/16/2018  Patient Details  Name: Alejandra [name] MRN: [id number] Date of Birth: 11-Feb-1941  Clinical Social Work is seeking post-discharge placement for this patient at the [place] level of care (*CSW will initial, date and re-position this form in  chart as items are completed):  Yes   Patient/family provided with [place] Work Department's list of facilities offering this level of care within the geographic area requested by the patient (or if unable, by the patient's family).  Yes   Patient/family informed of their freedom to choose among providers that offer the needed level of care, that participate in Medicare, Medicaid or managed care program needed by the patient, have an available bed and are willing to accept the patient.  Yes   Patient/family informed of [facility]'s ownership interest in [facility] and [facility], as well as of the fact that they are under no obligation to receive care at these facilities.  PASRR submitted to EDS on       PASRR number received on       Existing PASRR number confirmed on 04/15/18     FL2 transmitted to all facilities in geographic area requested by pt/family on 04/15/18     FL2 transmitted to all facilities within larger geographic area on       Patient informed that his/her managed care company has contracts with or will negotiate with certain facilities, including the following:        Yes   Patient/family  informed of bed offers received.  Patient chooses bed at       Physician recommends and patient chooses bed at      Patient to be transferred to   on  .  Patient to be transferred to facility by       Patient family notified on   of transfer.  Name of family member notified:        PHYSICIAN       Additional Comment:    _______________________________________________ Burnis Medin, LCSW 04/16/2018, 1:30 PM

## 2018-04-16 NOTE — Progress Notes (Signed)
CSW informed by patient's RN that patient is now agreeable to SNF for ST rehab.  CSW followed up with patient at bedside regarding discharge plans. Patient reported that she is now agreeable to SNF noting that she is not able to return home. CSW agreed to complete patient's FL2 and follow up with bed offers.  CSW will complete patient's FL2 and follow up with bed offers.  Abundio Miu, [place] Social Worker [facility] Cell#: [phone]

## 2018-04-16 NOTE — Care Management Note (Signed)
Case Management Note  Patient Details  Name: Alejandra [name] MRN: [id number] Date of Birth: 21-Apr-1941  Subjective/Objective: dC plan SNF. CSW following                   Action/Plan:dc SNF.   Expected Discharge Date:  04/15/18               Expected Discharge Plan:  Skilled Nursing Facility  In-House Referral:  Clinical Social Work  Discharge planning Services  CM Consult  Post Acute Care Choice:  Durable Medical Equipment, Home Health(rw,cane;actuve w/[facility]-HHRN/PT/OT) Choice offered to:  Patient  DME Arranged:    DME Agency:     HH Arranged:    [place] Agency:     Status of Service:  Completed, signed off  If discussed at [employer], dates discussed:    Additional Comments:  Dessa Phi, RN 04/16/2018, 9:54 AM

## 2018-04-16 NOTE — Progress Notes (Signed)
Physical Therapy Treatment Patient Details Name: Alejandra [name] MRN: [id number] DOB: April 07, 1941 Today's Date: 04/16/2018    History of Present Illness Pt admitted 04/12/18 for fall from bed and landing on R operative hip. Pt had R THA revision posterior approach on 02/04/18. PMH includes arthritis, avascular necrosis of R femoral head, cellulitis, fx of R hip 2013, glaucoma, HTN, rhabdo 2016, thrombocytosis, failed THA 02/01/18, R THA revision 02/04/18 posterior approach.     PT Comments    Pt with improved ambulation distance today. Continuing to reinforce posterior hip precautions during mobility. Pt agrees with SNF being appropriate d/c plan at this time due to assist level and safety. Pt's plan is to d/c this afternoon. Will otherwise continue to follow acutely.    Follow Up Recommendations  SNF;Supervision for mobility/OOB     Equipment Recommendations  None recommended by PT    Recommendations for Other Services       Precautions / Restrictions Precautions Precautions: Fall;Posterior Hip Precaution Comments: followed R posterior hip precautions during session, pt able to state 1/3 precautions.  Reminded of all precautions verbally.  Restrictions Weight Bearing Restrictions: No    Mobility  Bed Mobility Overal bed mobility: Needs Assistance Bed Mobility: Supine to Sit     Supine to sit: Mod assist     General bed mobility comments: Mod assist for trunk elevation and RLE moving to EOB. Posterior precautions reinforced during bed mobility.   Transfers Overall transfer level: Needs assistance Equipment used: Rolling walker (2 wheeled) Transfers: Sit to/from Stand Sit to Stand: Min assist;From elevated surface         General transfer comment: Min assist for power up and steadying. Verbal cuing for hand placement.   Ambulation/Gait Ambulation/Gait assistance: Min assist Gait Distance (Feet): 65 Feet Assistive device: Rolling walker (2 wheeled) Gait  Pattern/deviations: Step-to pattern;Decreased stride length;Antalgic;Decreased weight shift to right;Decreased stance time - right Gait velocity: decr   General Gait Details: Assist for maintaining precautions, steadying especially during turns.   Stairs             Wheelchair Mobility    Modified Rankin (Stroke Patients Only)       Balance Overall balance assessment: Needs assistance Sitting-balance support: Bilateral upper extremity supported;Feet supported Sitting balance-Leahy Scale: Fair Sitting balance - Comments: Can sit without support, but does not choose to sit EOB without UE support.   Standing balance support: Bilateral upper extremity supported Standing balance-Leahy Scale: Poor Standing balance comment: relies on RW for steadying and support                             Cognition Arousal/Alertness: Awake/alert Behavior During Therapy: WFL for tasks assessed/performed Overall Cognitive Status: Within Functional Limits for tasks assessed                                        Exercises      General Comments        Pertinent Vitals/Pain Pain Assessment: 0-10 Pain Score: 5  Pain Location: R hip  Pain Descriptors / Indicators: Sore;Aching Pain Intervention(s): Limited activity within patient's tolerance;Monitored during session;Repositioned;Ice applied    Home Living                      Prior Function  PT Goals (current goals can now be found in the care plan section) Acute Rehab PT Goals PT Goal Formulation: With patient Time For Goal Achievement: 04/29/18 Potential to Achieve Goals: Good Progress towards PT goals: Progressing toward goals    Frequency    Min 3X/week      PT Plan Current plan remains appropriate    Co-evaluation              AM-PAC PT "6 Clicks" Daily Activity  Outcome Measure  Difficulty turning over in bed (including adjusting bedclothes, sheets and  blankets)?: Unable Difficulty moving from lying on back to sitting on the side of the bed? : Unable Difficulty sitting down on and standing up from a chair with arms (e.g., wheelchair, bedside commode, etc,.)?: Unable Help needed moving to and from a bed to chair (including a wheelchair)?: A Little Help needed walking in hospital room?: A Little Help needed climbing 3-5 steps with a railing? : A Little 6 Click Score: 12    End of Session Equipment Utilized During Treatment: Gait belt Activity Tolerance: Patient tolerated treatment well Patient left: in chair;with chair alarm set;with call bell/phone within reach   PT Visit Diagnosis: Other abnormalities of gait and mobility (R26.89);History of falling (Z91.81)     Time: 1240-1305 PT Time Calculation (min) (ACUTE ONLY): 25 min  Charges:  $Gait Training: 8-22 mins $Therapeutic Activity: 8-22 mins                     Bora Broner Conception Chancy, PT, DPT  Pager # [phone]    [place] 04/16/2018, 2:00 PM

## 2018-04-16 NOTE — Progress Notes (Signed)
[facility] PROGRESS NOTE    Progress Note  Alejandra [name]  SHF:[id number] DOB: August 28, 1940 DOA: 04/12/2018 PCP: Vincente Liberty, MD     Brief Narrative:   Alejandra [name] is an 77 y.o. 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 She is awaiting placement.  Normocytic anemia: Possible UTI: Essential Hypertension: Elevated troponins: Myeloproliferative disorder polycythemia vera/thrombocytosis: AKI (acute kidney injury) ([place])    DVT prophylaxis: SCD Family Communication:none Disposition Plan/Barrier to D/C: unable to determine Code Status:     Code Status Orders  (From admission, onward)         Start     Ordered   04/12/18 1433  Full code  Continuous     04/12/18 1432        Code Status History    Date Active Date Inactive Code Status Order ID Comments User Context   02/04/2018 1824 02/12/2018 1820 Full Code [id number]  Neldon Newport Inpatient   09/28/2014 0136 09/29/2014 1602 Partial Code [id number]  Ivor Costa, MD Inpatient   10/12/2011 1510 10/13/2011 0054 Full Code [id number]  Johnny Bridge, MD Inpatient    Advance Directive Documentation     Most Recent Value  Type of Advance Directive  Healthcare Power of Attorney, Living will  Pre-existing out of facility DNR order (yellow form or pink MOST form)  -  "MOST" Form in Place?  -        IV Access:    Peripheral IV   Procedures and diagnostic studies:   No results found.   Medical Consultants:    None.  Anti-Infectives:   None  Subjective:    Mikele M Bussa no complaints.  Objective:    Vitals:   04/14/18 2137 04/15/18 0601 04/15/18 2022 04/16/18 0527  BP: (!) 182/90 (!) 168/91 (!) 154/87 (!) 146/91  Pulse: (!) 101 (!)  103 (!) 108 98  Resp: 18 18 18 18   Temp: 99.7 F (37.6 C) 100 F (37.8 C) 100 F (37.8 C) 99.8 F (37.7 C)  TempSrc: Oral  Oral Oral  SpO2: 100% 99% 99% 95%  Weight:      Height:        Intake/Output Summary (Last 24 hours) at 04/16/2018 0947 Last data filed at 04/16/2018 0600 Gross per 24 hour  Intake 0 ml  Output 650 ml  Net -650 ml   Filed Weights   04/12/18 0952 04/12/18 1412  Weight: 51.3 kg 53.4 kg    Exam: General exam: In no acute distress. Respiratory system: Good air movement and clear to auscultation. Cardiovascular system: S1 & S2 heard, RRR.  Gastrointestinal system: Abdomen is nondistended, soft and nontender.  Central nervous system: Alert and oriented. No focal neurological deficits. Extremities: No pedal edema. Skin: Hematoma of the right hip not swollen but tender to palpation Psychiatry: Judgement and insight appear normal. Mood & affect appropriate.    Data Reviewed:    Labs: Basic Metabolic Panel: Recent Labs  Lab 04/12/18 1032 04/13/18 0236 04/14/18 1014 04/15/18 0509 04/16/18 0503  NA 140 143 140 142 140  K 4.7 4.4 3.9 3.8 3.8  CL 103 111 112* 110 105  CO2 24 25 21* 25 28  GLUCOSE 139* 97 128* 132* 102*  BUN 38* 32* 17  9 14  CREATININE 2.48* 1.18* 0.76 0.60 0.59  CALCIUM 8.9 8.4* 8.2* 9.2 8.6*   GFR Estimated Creatinine Clearance: 48.9 mL/min (by C-G formula based on SCr of 0.59 mg/dL). Liver Function Tests: Recent Labs  Lab 04/12/18 1032  AST 21  ALT 13  ALKPHOS 104  BILITOT 0.8  PROT 7.0  ALBUMIN 3.1*   No results for input(s): LIPASE, AMYLASE in the last 168 hours. No results for input(s): AMMONIA in the last 168 hours. Coagulation profile No results for input(s): INR, PROTIME in the last 168 hours.  CBC: Recent Labs  Lab 04/12/18 1032 04/13/18 0236 04/13/18 2315 04/14/18 1709 04/15/18 0509  WBC 46.7* 33.0* 30.6* 40.8* 39.0*  NEUTROABS 43.3  --   --   --   --   HGB 8.5* 7.0* 7.6* 10.5* 10.6*  HCT 29.0* 24.1*  25.7* 34.2* 34.1*  MCV 74.6* 75.8* 78.8 78.6 77.5*  PLT 2,234* 1,803* 1,345* 1,808* 1,629*   Cardiac Enzymes: Recent Labs  Lab 04/12/18 1032 04/12/18 1045 04/12/18 1518 04/12/18 2002 04/13/18 0236  CKTOTAL  --  91  --   --   --   TROPONINI 0.08*  --  0.10* 0.08* 0.06*   BNP (last 3 results) No results for input(s): PROBNP in the last 8760 hours. CBG: No results for input(s): GLUCAP in the last 168 hours. D-Dimer: No results for input(s): DDIMER in the last 72 hours. Hgb A1c: No results for input(s): HGBA1C in the last 72 hours. Lipid Profile: No results for input(s): CHOL, HDL, LDLCALC, TRIG, CHOLHDL, LDLDIRECT in the last 72 hours. Thyroid function studies: No results for input(s): TSH, T4TOTAL, T3FREE, THYROIDAB in the last 72 hours.  Invalid input(s): FREET3 Anemia work up: No results for input(s): VITAMINB12, FOLATE, FERRITIN, TIBC, IRON, RETICCTPCT in the last 72 hours. Sepsis Labs: Recent Labs  Lab 04/12/18 1100 04/12/18 1341 04/13/18 0236 04/13/18 2315 04/14/18 1709 04/15/18 0509  WBC  --   --  33.0* 30.6* 40.8* 39.0*  LATICACIDVEN 1.36 0.91  --   --   --   --    Microbiology Recent Results (from the past 240 hour(s))  Urine Culture     Status: Abnormal   Collection Time: 04/12/18 11:15 AM  Result Value Ref Range Status   Specimen Description   Final    URINE, CATHETERIZED Performed at [facility], [place] [address], [place], [place] [address]    Special Requests   Final    NONE Performed at [facility], [place] [address] [place],  [address]    Culture >=100,000 COLONIES/mL ESCHERICHIA COLI (A)  Final   Report Status 04/14/2018 FINAL  Final   Organism ID, Bacteria ESCHERICHIA COLI (A)  Final      Susceptibility   Escherichia coli - MIC*    AMPICILLIN <=2 SENSITIVE Sensitive     CEFAZOLIN <=4 SENSITIVE Sensitive     CEFTRIAXONE <=1 SENSITIVE Sensitive     CIPROFLOXACIN <=0.25 SENSITIVE Sensitive      GENTAMICIN <=1 SENSITIVE Sensitive     IMIPENEM <=0.25 SENSITIVE Sensitive     NITROFURANTOIN <=16 SENSITIVE Sensitive     TRIMETH/SULFA <=20 SENSITIVE Sensitive     AMPICILLIN/SULBACTAM <=2 SENSITIVE Sensitive     PIP/TAZO <=4 SENSITIVE Sensitive     Extended ESBL NEGATIVE Sensitive     * >=100,000 COLONIES/mL ESCHERICHIA COLI  MRSA PCR Screening     Status: None   Collection Time: 04/12/18  6:21 PM  Result Value Ref Range Status  MRSA by PCR NEGATIVE NEGATIVE Final    Comment:        The GeneXpert MRSA Assay (FDA approved for NASAL specimens only), is one component of a comprehensive MRSA colonization surveillance program. It is not intended to diagnose MRSA infection nor to guide or monitor treatment for MRSA infections. Performed at [facility], [place] [address], [place], [place] [address]      Medications:   . amLODipine  10 mg Oral Daily  . benazepril  40 mg Oral Daily  . cephALEXin  500 mg Oral Q8H  . latanoprost  1 drop Both Eyes QHS   Continuous Infusions: . sodium chloride 100 mL/hr at 04/15/18 0300      LOS: 4 days   Charlynne Cousins  [facility] Pager [phone]  *Please refer to [email or web address], password [email or web address] to get updated schedule on who will round on this patient, as hospitalists switch teams weekly. If 7PM-7AM, please contact night-coverage at [email or web address], password [email or web address] for any overnight needs.  04/16/2018, 9:47 AM

## 2018-04-16 NOTE — Progress Notes (Signed)
[name]  MRN: [id number]  DOB/AGE:  1941/06/23 / 77 y.o.        PROGRESS NOTE Subjective: Patient is alert, oriented, no Nausea, no Vomiting, yes passing gas, . Taking PO well.Denies SOB, Chest or Calf Pain. Ambulate 52' Patient reports pain as  3/10.  .    Objective: Vital signs in last 24 hours: Vitals:   04/14/18 2137 04/15/18 0601 04/15/18 2022 04/16/18 0527  BP: (Abnormal) 182/90 (Abnormal) 168/91 (Abnormal) 154/87 (Abnormal) 146/91  Pulse: (Abnormal) 101 (Abnormal) 103 (Abnormal) 108 98  Resp: 18 18 18 18   Temp: 99.7 F (37.6 C) 100 F (37.8 C) 100 F (37.8 C) 99.8 F (37.7 C)  TempSrc: Oral  Oral Oral  SpO2: 100% 99% 99% 95%  Weight:      Height:          Intake/Output from previous day: I/O last 3 completed shifts: In: 1203.5 [P.O.:355; I.V.:848.5] Out: 950 [Urine:950]   Intake/Output this shift: No intake/output data recorded.   LABORATORY DATA: Recent Labs    04/14/18 1709 04/15/18 0509 04/16/18 0503  WBC 40.8* 39.0*  --   HGB 10.5* 10.6*  --   HCT 34.2* 34.1*  --   PLT 1,808* 1,629*  --   NA  --  142 140  K  --  3.8 3.8  CL  --  110 105  CO2  --  25 28  BUN  --  9 14  CREATININE  --  0.60 0.59  GLUCOSE  --  132* 102*  CALCIUM  --  9.2 8.6*    Examination: Posterior lateral right hip wound is healed, there is still some fluid in the subcutaneous space over the greater trochanter, 1+ warmth over the hip.  Decreased from a few days ago.  Internal and external rotation the right hip causes no irritation.  Right hip aspirate cultures from 04/11/18, drawn in my office, have come back negative.  50 cc of dark blood was aspirated from the hip yesterday, he did not appear to be infected. XR AP&Lat of hip shows well placed\fixed THA  Assessment:    Status post-revision right total hip arthroplasty 02/04/18 with significant postoperative bleeding secondary to polycythemia vera involving red cell line, white cell line, and platelet cell  line,with significant platelet dysfunction.  Patient did well until last week when she had swelling in the right hip, with 220 cc of dark bloody fluid aspirated in my office on 04/11/18.  Cultures are now negative at 5 days.     Plan: PT/OT WBAT, THA  DVT Prophylaxis: None secondary to blood clotting disorder from polycythemia vera  Follow-up with me in 7-10 days or sooner should the swelling in the right hip get worse.  DISCHARGE PLAN: Skilled Nursing Facility/Rehab  DISCHARGE NEEDS: HHPT, Walker and 3-in-1 comode seat

## 2018-04-16 NOTE — Progress Notes (Signed)
Pt selects [facility] from bed offers- CSW confirmed with admissions and SNF initiated [facility] authorization request.  Sharren Bridge, MSW, LCSW Clinical Social Work 04/16/2018 [phone] coverage for [phone]

## 2018-04-17 DIAGNOSIS — Z743 Need for continuous supervision: Secondary | ICD-10-CM | POA: Diagnosis not present

## 2018-04-17 DIAGNOSIS — R748 Abnormal levels of other serum enzymes: Secondary | ICD-10-CM | POA: Diagnosis not present

## 2018-04-17 DIAGNOSIS — S7001XA Contusion of right hip, initial encounter: Secondary | ICD-10-CM | POA: Diagnosis not present

## 2018-04-17 DIAGNOSIS — Y92009 Unspecified place in unspecified non-institutional (private) residence as the place of occurrence of the external cause: Secondary | ICD-10-CM | POA: Diagnosis not present

## 2018-04-17 DIAGNOSIS — I959 Hypotension, unspecified: Secondary | ICD-10-CM | POA: Diagnosis not present

## 2018-04-17 DIAGNOSIS — I10 Essential (primary) hypertension: Secondary | ICD-10-CM | POA: Diagnosis not present

## 2018-04-17 DIAGNOSIS — T8130XA Disruption of wound, unspecified, initial encounter: Secondary | ICD-10-CM | POA: Diagnosis not present

## 2018-04-17 DIAGNOSIS — W19XXXA Unspecified fall, initial encounter: Secondary | ICD-10-CM

## 2018-04-17 DIAGNOSIS — N390 Urinary tract infection, site not specified: Secondary | ICD-10-CM | POA: Diagnosis not present

## 2018-04-17 DIAGNOSIS — N179 Acute kidney failure, unspecified: Secondary | ICD-10-CM

## 2018-04-17 DIAGNOSIS — W19XXXD Unspecified fall, subsequent encounter: Secondary | ICD-10-CM | POA: Diagnosis not present

## 2018-04-17 DIAGNOSIS — R58 Hemorrhage, not elsewhere classified: Secondary | ICD-10-CM | POA: Diagnosis not present

## 2018-04-17 DIAGNOSIS — M6281 Muscle weakness (generalized): Secondary | ICD-10-CM | POA: Diagnosis not present

## 2018-04-17 DIAGNOSIS — R279 Unspecified lack of coordination: Secondary | ICD-10-CM | POA: Diagnosis not present

## 2018-04-17 DIAGNOSIS — M25551 Pain in right hip: Secondary | ICD-10-CM

## 2018-04-17 DIAGNOSIS — R262 Difficulty in walking, not elsewhere classified: Secondary | ICD-10-CM | POA: Diagnosis not present

## 2018-04-17 LAB — BPAM RBC
BLOOD PRODUCT EXPIRATION DATE: 201909232359
BLOOD PRODUCT EXPIRATION DATE: 201909232359
Blood Product Expiration Date: 201909232359
ISSUE DATE / TIME: 201908311656
ISSUE DATE / TIME: 201909011154
UNIT TYPE AND RH: 6200
Unit Type and Rh: 6200
Unit Type and Rh: 6200

## 2018-04-17 LAB — TYPE AND SCREEN
ABO/RH(D): A POS
ANTIBODY SCREEN: POSITIVE
DAT, IgG: NEGATIVE
Donor AG Type: NEGATIVE
Donor AG Type: NEGATIVE
Donor AG Type: NEGATIVE
UNIT DIVISION: 0
Unit division: 0
Unit division: 0

## 2018-04-17 MED ORDER — OXYCODONE-ACETAMINOPHEN 5-325 MG PO TABS: 1.0000 | ORAL_TABLET | ORAL | 0 refills | Status: DC | PRN

## 2018-04-17 MED ORDER — ALPRAZOLAM 0.25 MG PO TABS: 0.2500 mg | ORAL_TABLET | Freq: Two times a day (BID) | ORAL | 0 refills | Status: AC | PRN

## 2018-04-17 NOTE — Progress Notes (Signed)
[name]  MRN: [id number]  DOB/AGE:  May 27, 1941 / 77 y.o.        PROGRESS NOTE Subjective:   Patient is alert, oriented, no Nausea, no Vomiting, yes passing gas, no Bowel Movement. Taking PO well. Denies SOB, Chest or Calf Pain. Using Incentive Spirometer, PAS in place. Ambulate WBAT with pt walking 65 ft with therapy, Patient reports pain as mild,     Objective: Vital signs in last 24 hours: Temp:  [99.2 F (37.3 C)-99.5 F (37.5 C)] 99.2 F (37.3 C) (09/04 0514) Pulse Rate:  [94-96] 94 (09/04 0514) Resp:  [18-20] 18 (09/04 0514) BP: (140-148)/(80-86) 148/86 (09/04 0514) SpO2:  [98 %-100 %] 98 % (09/04 0514)    Intake/Output from previous day: I/O last 3 completed shifts: In: 0  Out: 950 [Urine:950]   Intake/Output this shift: No intake/output data recorded.   LABORATORY DATA: Recent Labs    04/14/18 1709 04/15/18 0509 04/16/18 0503  WBC 40.8* 39.0*  --   HGB 10.5* 10.6*  --   HCT 34.2* 34.1*  --   PLT 1,808* 1,629*  --   NA  --  142 140  K  --  3.8 3.8  CL  --  110 105  CO2  --  25 28  BUN  --  9 14  CREATININE  --  0.60 0.59  GLUCOSE  --  132* 102*  CALCIUM  --  9.2 8.6*    Examination: Neurologically intact Neurovascular intact Sensation intact distally Intact pulses distally Dorsiflexion/Plantar flexion intact Incision: dressing C/D/I No cellulitis present Compartment soft} The pt's hip appears to continue to improve.  No pain with ROM. Assessment:      Status post-revision right total hip arthroplasty 02/04/18 with significant postoperative bleeding secondary to polycythemia vera involving red cell line, white cell line, and platelet cell line,with significant platelet dysfunction.  Patient did well until last week when she had swelling in the right hip, with 220 cc of dark bloody fluid aspirated in my office on 04/11/18.  Cultures are now negative at 5 days.   Plan:  PT/OT WBAT, THA  DVT Prophylaxis: None secondary to blood  clotting disorder from polycythemia vera  Follow-up with Dr. Mayer Camel in 7-10 days or sooner should the swelling in the right hip get worse.  DISCHARGE PLAN: Skilled Nursing Facility/Rehab   DISCHARGE NEEDS: HHPT, Walker and 3-in-1 comode seat     Joanell Rising 04/17/2018, 7:57 AM

## 2018-04-17 NOTE — Discharge Summary (Signed)
Physician Discharge Summary  Alejandra [name] BWG:[id number] DOB: 1940/10/13 DOA: 04/12/2018  PCP: Vincente Liberty, MD  Admit date: 04/12/2018 Discharge date: 04/17/2018  Time spent: 45 minutes  Recommendations for Outpatient Follow-up:  Patient will be discharged to skilled nursing facility, continue physical and occupational thearpy.  Patient will need to follow up with primary care provider within one week of discharge. Follow up with Dr. Benay Spice, oncology, in one week, repeat CBC. Follow up with orthopedic surgery, Dr. Mayer Camel, in one week  Patient should continue medications as prescribed.  Patient should follow a heart healthy diet.   Discharge Diagnoses:  Right hip pain and hematoma secondary to fall Urinary tract infection Hypotension Essential hypertension Acute on chronic microcytic anemia Elevated troponin Myeloproliferative disorder with polycythemia vera and thrombocytosis Acute kidney injury  Discharge Condition: Stable  Diet recommendation: heart healthy  Filed Weights   04/12/18 0952 04/12/18 1412  Weight: 51.3 kg 53.4 kg    History of present illness:  On 04/12/2018 by Dr. 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  Patient lives alone and stated that she was in her usual state of health until last night around 1 AM when she rolled off her bed and landed on her right hip.  Per patient, she had fluid removed by her orthopedics, Dr Mayer Camel yesterday.  She denied any dizziness, lightheadedness or syncopal episode.  Initial BP for EMS was 88/60.  Per patient she felt very weak and due to pain, she was unable to pull herself up or call for help.  Her sister came to check up on her this morning around 8 AM and found her on the floor.  Besides complaints of sore  everywhere, patient denied any other complaints of chest pain, dyspnea, nausea vomiting or abdominal pain.   Hospital Course:  Right hip pain and hematoma secondary to fall -X-ray on 04/12/2018 showed no acute finding -Orthopedic surgery consulted and appreciated, no surgical intervention at this time -Status post revision right total hip arthroplasty on 02/04/2018 with significant postoperative bleeding secondary to polycythemia vera and platelet dysfunction.  Patient was recently seen as an outpatient in the orthopedic office on 04/11/2018 and was noted to have swelling of the right hip, 220 cc of dark bloody fluid were aspirated.  Cultures have been negative to date. -50 cc of dark blood was aspirated during hospitalization on 04/15/2018 -PT consulted recommending SNF  Urinary tract infection -Urine culture >100k Ecoli, pansensitive -Continue Keflex  Hypotension -Noted to be hypotensive on admission, this is not resolved  Essential hypertension -Continue amlodipine, benazepril  Acute on chronic microcytic anemia -Upon review of patient's chart baseline hemoglobin approximately 8 since June 2019 -During hospitalization, hemoglobin dropped to 7.0 on 04/13/2018, unknown cause -She was transfused 2 units PRBC -Hemoglobin currently 10.6 -Repeat CBC in 1 week  Elevated troponin -No complaints of chest pain or shortness of breath -Troponins cycled and remained flat -Echocardiogram: EF of 50 to 55%.  No regional wall motion abnormalities.  Left ventricular diastolic function parameters normal.  Myeloproliferative disorder with polycythemia vera and thrombocytosis -Appear to be improving, suspect markers were elevated due to reactive response -Oncology consulted and appreciated, recommended holding aspirin with outpatient follow up the week of 04/22/2018  Acute kidney injury -Possibly secondary to medication and dehydration -Creatinine on admission 2.48, now down to  0.59  Procedures: Aspiration per orthopedics  Consultations: Orthopedic surgery Oncology  Discharge Exam: Vitals:   04/17/18 0514 04/17/18 1057  BP: (!) 148/86 (!) 142/75  Pulse: 94   Resp: 18   Temp: 99.2 F (37.3 C)   SpO2: 98%      General: Well developed, well nourished, NAD, appears stated age  1: NCAT,mucous membranes moist.  Neck: Supple   Cardiovascular: S1 S2 auscultated, RRR, no murmurs  Respiratory: Clear to auscultation bilaterally with equal chest rise  Abdomen: Soft, nontender, nondistended, + bowel sounds  Extremities: warm dry without cyanosis clubbing or edema  Neuro: AAOx3, nonfocal  Psych: Normal affect and demeanor, pleasant  Discharge Instructions Discharge Instructions    Diet - low sodium heart healthy   Complete by:  As directed    Discharge instructions   Complete by:  As directed    Patient will be discharged to skilled nursing facility, continue physical and occupational thearpy.  Patient will need to follow up with primary care provider within one week of discharge. Follow up with Dr. Benay Spice, oncology, in one week, repeat CBC. Follow up with orthopedic surgery, Dr. Mayer Camel, in one week  Patient should continue medications as prescribed.  Patient should follow a heart healthy diet.   Increase activity slowly   Complete by:  As directed    Weight bearing as tolerated   Complete by:  As directed    Laterality:  right   Extremity:  Lower     Allergies as of 04/17/2018   No Known Allergies     Medication List    STOP taking these medications   QC ASPIRIN LOW DOSE 81 MG chewable tablet Generic drug:  aspirin     TAKE these medications   ALPRAZolam 0.25 MG tablet Commonly known as:  XANAX Take 0.25 mg by mouth 2 (two) times daily as needed for anxiety.   amLODipine 10 MG tablet Commonly known as:  NORVASC Take 10 mg by mouth daily.   atorvastatin 10 MG tablet Commonly known as:  LIPITOR Take 10 mg by mouth at  bedtime.   benazepril 40 MG tablet Commonly known as:  LOTENSIN Take 40 mg by mouth daily.   cephALEXin 500 MG capsule Commonly known as:  KEFLEX Take 1 capsule (500 mg total) by mouth 3 (three) times daily.   hydroxyurea 500 MG capsule Commonly known as:  HYDREA Take 1 tablet (500mg ) every MWF. Take 2 tablets (1,000mg ) all other days What changed:    how much to take  how to take this  when to take this   latanoprost 0.005 % ophthalmic solution Commonly known as:  XALATAN Place 1 drop into both eyes at bedtime.   loratadine 10 MG tablet Commonly known as:  CLARITIN Take 10 mg by mouth daily as needed for allergies.   meclizine 25 MG tablet Commonly known as:  ANTIVERT Take 25 mg by mouth 3 (three) times daily as needed for dizziness.   oxyCODONE-acetaminophen 5-325 MG tablet Commonly known as:  PERCOCET/ROXICET Take 1 tablet by mouth every 4 (four) hours as needed for severe pain.   SIMBRINZA 1-0.2 % Susp Generic drug:  Brinzolamide-Brimonidine Place 1 drop into both eyes 2 (two) times daily.   tiZANidine 2 MG tablet Commonly known as:  ZANAFLEX Take 1 tablet (2 mg total) by mouth every 6 (six) hours as needed. What changed:  reasons to take this   TRAVATAN Z 0.004 % Soln ophthalmic solution Generic drug:  Travoprost (BAK Free) Place  1 drop into both eyes at bedtime.            Discharge Care Instructions  (From admission, onward)         Start     Ordered   04/15/18 0000  Weight bearing as tolerated    Question Answer Comment  Laterality right   Extremity Lower      04/15/18 1157         No Known Allergies  Contact information for follow-up providers    [facility], [place] Follow up.   Specialty:  [place] Why:  [facility] nursing/physical/occupational therapy Contact information: [place] [address] [phone]        Frederik Pear, MD. Schedule an appointment as soon as possible for a visit in 10 day(s).    Specialty:  Orthopedic Surgery Contact information: [place] [address] [place], [place] information: [place] [address] [phone]            Contact information for after-discharge care    Destination    [facility] .   Service:  Skilled Nursing Contact information: [place] [phone]                   The results of significant diagnostics from this hospitalization (including imaging, microbiology, ancillary and laboratory) are listed below for reference.    Significant Diagnostic Studies: Dg Chest Port 1 View  Result Date: 04/12/2018 CLINICAL DATA:  Weakness and shortness of breath. EXAM: PORTABLE CHEST 1 VIEW COMPARISON:  Two-view chest x-ray 01/29/2018 FINDINGS: Heart is enlarged. There is no edema or effusion. Aortic atherosclerosis is present. Changes of COPD are again noted. Advanced degenerative changes are present at both shoulders. IMPRESSION: 1. Stable cardiomegaly without failure. 2. Aortic atherosclerosis. 3. Advanced degenerative changes involve the shoulders bilaterally. Electronically Signed   By: San Morelle M.D.   On: 04/12/2018 11:28   Dg Hip Unilat  With Pelvis 2-3 Views Right  Result Date: 04/12/2018 CLINICAL DATA:  Fall.  History of hip replacement EXAM: DG HIP (WITH OR WITHOUT PELVIS) 2-3V RIGHT COMPARISON:  Fluoroscopy from 02/04/2018 FINDINGS: Total right hip arthroplasty with recent revision. The prosthesis appears well seated. No periprosthetic fracture or dislocation. Remote trauma to the right obturator ring. No evidence of acute pelvic ring fracture. There is gas lateral to the right hip, status post incision drainage yesterday per report. Advanced left hip osteoarthritis. IMPRESSION: No acute finding. Electronically Signed   By: Monte Fantasia M.D.   On: 04/12/2018 10:48     Microbiology: Recent Results (from the past 240 hour(s))  Urine Culture     Status: Abnormal   Collection Time: 04/12/18 11:15 AM  Result Value Ref Range Status   Specimen Description   Final    URINE, CATHETERIZED Performed at [facility] [address] [place], [place] [address]    Special Requests   Final    NONE Performed at [facility], [place] [address] [place],  [address]    Culture >=100,000 COLONIES/mL ESCHERICHIA COLI (A)  Final   Report Status 04/14/2018 FINAL  Final   Organism ID, Bacteria ESCHERICHIA COLI (A)  Final      Susceptibility   Escherichia coli - MIC*    AMPICILLIN <=2 SENSITIVE Sensitive     CEFAZOLIN <=4 SENSITIVE Sensitive  CEFTRIAXONE <=1 SENSITIVE Sensitive     CIPROFLOXACIN <=0.25 SENSITIVE Sensitive     GENTAMICIN <=1 SENSITIVE Sensitive     IMIPENEM <=0.25 SENSITIVE Sensitive     NITROFURANTOIN <=16 SENSITIVE Sensitive     TRIMETH/SULFA <=20 SENSITIVE Sensitive     AMPICILLIN/SULBACTAM <=2 SENSITIVE Sensitive     PIP/TAZO <=4 SENSITIVE Sensitive     Extended ESBL NEGATIVE Sensitive     * >=100,000 COLONIES/mL ESCHERICHIA COLI  MRSA PCR Screening     Status: None   Collection Time: 04/12/18  6:21 PM  Result Value Ref Range Status   MRSA by PCR NEGATIVE NEGATIVE Final    Comment:        The GeneXpert MRSA Assay (FDA approved for NASAL specimens only), is one component of a comprehensive MRSA colonization surveillance program. It is not intended to diagnose MRSA infection nor to guide or monitor treatment for MRSA infections. Performed at [facility], [place] [address], [place], [place] [address]      Labs: Basic Metabolic Panel: Recent Labs  Lab 04/12/18 1032 04/13/18 0236 04/14/18 1014 04/15/18 0509 04/16/18 0503  NA 140 143 140 142 140  K 4.7 4.4 3.9 3.8 3.8  CL 103 111 112* 110 105  CO2 24 25 21* 25 28  GLUCOSE 139* 97 128* 132* 102*  BUN 38* 32* 17 9  14   CREATININE 2.48* 1.18* 0.76 0.60 0.59  CALCIUM 8.9 8.4* 8.2* 9.2 8.6*   Liver Function Tests: Recent Labs  Lab 04/12/18 1032  AST 21  ALT 13  ALKPHOS 104  BILITOT 0.8  PROT 7.0  ALBUMIN 3.1*   No results for input(s): LIPASE, AMYLASE in the last 168 hours. No results for input(s): AMMONIA in the last 168 hours. CBC: Recent Labs  Lab 04/12/18 1032 04/13/18 0236 04/13/18 2315 04/14/18 1709 04/15/18 0509  WBC 46.7* 33.0* 30.6* 40.8* 39.0*  NEUTROABS 43.3  --   --   --   --   HGB 8.5* 7.0* 7.6* 10.5* 10.6*  HCT 29.0* 24.1* 25.7* 34.2* 34.1*  MCV 74.6* 75.8* 78.8 78.6 77.5*  PLT 2,234* 1,803* 1,345* 1,808* 1,629*   Cardiac Enzymes: Recent Labs  Lab 04/12/18 1032 04/12/18 1045 04/12/18 1518 04/12/18 2002 04/13/18 0236  CKTOTAL  --  91  --   --   --   TROPONINI 0.08*  --  0.10* 0.08* 0.06*   BNP: BNP (last 3 results) No results for input(s): BNP in the last 8760 hours.  ProBNP (last 3 results) No results for input(s): PROBNP in the last 8760 hours.  CBG: No results for input(s): GLUCAP in the last 168 hours.     Signed:  Cristal Ford  [facility] 04/17/2018, 1:54 PM

## 2018-04-17 NOTE — Clinical Social Work Placement (Signed)
Patient received and accepted bed offer at [place] [facility]. Facility aware of patient's discharge and confirmed bed offer [employer] authorization received). [facility] contacted, patient's family notified. Patient's RN can call report to [phone], packet complete. CSW signing off, no other needs identified at this time.  CLINICAL SOCIAL WORK PLACEMENT  NOTE  Date:  04/17/2018  Patient Details  Name: Alejandra [name] MRN: [id number] Date of Birth: 06/14/1941  Clinical Social Work is seeking post-discharge placement for this patient at the [place] level of care (*CSW will initial, date and re-position this form in  chart as items are completed):  Yes   Patient/family provided with [place] Work Department's list of facilities offering this level of care within the geographic area requested by the patient (or if unable, by the patient's family).  Yes   Patient/family informed of their freedom to choose among providers that offer the needed level of care, that participate in Medicare, Medicaid or managed care program needed by the patient, have an available bed and are willing to accept the patient.  Yes   Patient/family informed of [facility]'s ownership interest in [facility] and [facility], as well as of the fact that they are under no obligation to receive care at these facilities.  PASRR submitted to EDS on       PASRR number received on       Existing PASRR number confirmed on 04/15/18     FL2 transmitted to all facilities in geographic area requested by pt/family on 04/15/18     FL2 transmitted to all facilities within larger geographic area on       Patient informed that his/her managed care company has contracts with or will negotiate with certain facilities, including the following:        Yes   Patient/family informed of bed offers received.  Patient [facility] bed at [facility]      Physician recommends and patient chooses bed at      Patient to be transferred to [facility] on 04/17/18.  Patient to be transferred to facility by [facility]     Patient family notified on 04/17/18 of transfer.  Name of family member notified:  [facility] [place]     PHYSICIAN       Additional Comment:    _______________________________________________ Burnis Medin, LCSW 04/17/2018, 2:35 PM

## 2018-04-17 NOTE — Care Management Important Message (Signed)
Important Message  Patient Details  Name: Alejandra [name] MRN: [id number] Date of Birth: [birth year]   Medicare Important Message Given:  Yes    Kerin Salen 04/17/2018, 12:36 [facility] Message  Patient Details  Name: Alejandra [name] MRN: [id number] Date of Birth: [birth year]   Medicare Important Message Given:  Yes    Kerin Salen 04/17/2018, 12:35 PM

## 2018-04-17 NOTE — Progress Notes (Signed)
Report called to [employer], LPN at [facility]. Patient stable and awaiting transport.  Gonzella Lex, RN 04/17/2018 3:35 PM

## 2018-04-18 DIAGNOSIS — N390 Urinary tract infection, site not specified: Secondary | ICD-10-CM | POA: Diagnosis not present

## 2018-04-18 DIAGNOSIS — I10 Essential (primary) hypertension: Secondary | ICD-10-CM | POA: Diagnosis not present

## 2018-04-18 DIAGNOSIS — S7001XA Contusion of right hip, initial encounter: Secondary | ICD-10-CM | POA: Diagnosis not present

## 2018-04-19 ENCOUNTER — Telehealth: Payer: Self-pay

## 2018-04-19 NOTE — Telephone Encounter (Signed)
Ms. Mardene Celeste returned my call for the request of the patient Alejandra [name] schedule, in order to set up the patient transportation. Per 9/6 phone msg returns

## 2018-04-19 NOTE — Telephone Encounter (Signed)
Per requested verification of appointment from transportation at patient current living facility. Returned call and was unable to speak to anyone. Alejandra [name] [phone]) [place]

## 2018-04-20 ENCOUNTER — Encounter (HOSPITAL_COMMUNITY): Payer: Self-pay

## 2018-04-20 ENCOUNTER — Emergency Department (HOSPITAL_COMMUNITY)
Admission: EM | Admit: 2018-04-20 | Discharge: 2018-04-21 | Disposition: A | Discharge status: Home / Self Care | Payer: Medicare HMO | Source: Home / Self Care | Attending: Emergency Medicine | Admitting: Emergency Medicine

## 2018-04-20 DIAGNOSIS — I10 Essential (primary) hypertension: Secondary | ICD-10-CM | POA: Insufficient documentation

## 2018-04-20 DIAGNOSIS — Z79899 Other long term (current) drug therapy: Secondary | ICD-10-CM | POA: Insufficient documentation

## 2018-04-20 DIAGNOSIS — Z7982 Long term (current) use of aspirin: Secondary | ICD-10-CM | POA: Insufficient documentation

## 2018-04-20 DIAGNOSIS — Z87891 Personal history of nicotine dependence: Secondary | ICD-10-CM | POA: Insufficient documentation

## 2018-04-20 DIAGNOSIS — T8131XA Disruption of external operation (surgical) wound, not elsewhere classified, initial encounter: Secondary | ICD-10-CM | POA: Insufficient documentation

## 2018-04-20 DIAGNOSIS — Y829 Unspecified medical devices associated with adverse incidents: Secondary | ICD-10-CM | POA: Insufficient documentation

## 2018-04-20 DIAGNOSIS — T8130XA Disruption of wound, unspecified, initial encounter: Secondary | ICD-10-CM | POA: Diagnosis not present

## 2018-04-20 NOTE — ED Triage Notes (Addendum)
Per ems: pt coming from [facility] c/o of bleeding from right hip incision. Pt was getting her depends changed, cna at facility noted about 250 cc of blood. Bleeding was control when EMS arrived and restarted once moving her into the hospital bed. Currently controlled. Incision noted to be approx 1.5 inch with venous red blood oozing out. Had surgery on 7/24. Pt on blood thinners

## 2018-04-20 NOTE — ED Notes (Signed)
Bed: VL31 Expected date:  Expected time:  Means of arrival:  Comments: 77 yo F/ SNF-Bleeding from incision site

## 2018-04-20 NOTE — ED Provider Notes (Signed)
[facility] DEPT Provider Note   CSN: [id number] Arrival date & time: 04/20/18  2116     History   Chief Complaint Chief Complaint  Patient presents with  . Post-op Problem    bleeding from incision    HPI Alejandra [name] is a 77 y.o. female with a past medical history of polycythemia vera, hypertension, status post right hip total arthropathy revision on 6/24 by Dr. Mayer Camel, who presents to ED for evaluation of bleeding from incision site that staff at nursing home noticed today while changing depends.  Patient gets daily changes from staff.  They noticed 1.5 cm opening of the incision site with dark venous blood oozing from it.  Patient denies any recent falls.  She reports soreness of her right hip which she states is typical for her.  No worsening of pain noted.  Denies any changes to range of motion.  Patient usually ambulates with a walker but will occasionally use a wheelchair when she has back pain.  Denies any fevers, leg swelling, numbness in legs. Of note, patient saw Dr. Mayer Camel last week for aspiration of the joint.  She originally had the hip surgery in 2013.  HPI  Past Medical History:  Diagnosis Date  . Arthritis   . Avascular necrosis of femur head, right ([place]) 04/23/2012  . Cellulitis of shoulder 09/28/2014  . Fracture 10/05/11   "fell and broke right hip"  . Fracture of femoral neck, right ([place]) 10/12/2011  . Glaucoma   . Glaucoma    bilateral  . Hypertension   . Polycythemia vera(238.4)   . Rhabdomyolysis 09/28/2014  . Thrombocytosis ([place]) 04/23/2012    Patient Active Problem List   Diagnosis Date Noted  . Hematoma of right hip 04/15/2018  . Right hip pain 04/13/2018  . Fall at home, initial encounter 04/13/2018  . AKI (acute kidney injury) ([place]) 04/13/2018  . Elevated troponin 04/13/2018  . Possible Acute lower UTI 04/13/2018  . Sepsis ([place]) 04/12/2018  . UTI (urinary tract infection) 04/12/2018  . Primary osteoarthritis of right  hip 02/04/2018  . Failed total hip arthroplasty ([place]) 02/01/2018  . Pre-operative cardiovascular examination 01/31/2018  . Abnormal EKG 01/31/2018  . Vertigo 09/28/2014  . Essential hypertension 09/28/2014  . Thrombocytosis ([place]) 04/23/2012  . Polycythemia vera ([place]) 12/22/2011    Past Surgical History:  Procedure Laterality Date  . COLONOSCOPY    . HARDWARE REMOVAL  04/23/2012   Procedure: HARDWARE REMOVAL;  Surgeon: Johnny Bridge, MD;  Location: [facility];  Service: Orthopedics;  Laterality: Right;  . HIP ARTHROPLASTY  04/23/2012   Procedure: ARTHROPLASTY BIPOLAR HIP;  Surgeon: Johnny Bridge, MD;  Location: [facility];  Service: Orthopedics;  Laterality: Right;  . HIP PINNING,CANNULATED  10/12/2011   Procedure: CANNULATED HIP PINNING;  Surgeon: Johnny Bridge, MD;  Location: [facility];  Service: Orthopedics;  Laterality: Right;  . TOTAL HIP REVISION Right 02/04/2018   Procedure: RIGHT TOTAL HIP REVISION: REVISION OF RIGHT MONOPOLAR/CONVERSION TO TOTAL HIP;  Surgeon: Frederik Pear, MD;  Location: [facility];  Service: Orthopedics;  Laterality: Right;  Requesting 2 hours     OB History   None      Home Medications    Prior to Admission medications   Medication Sig Start Date End Date Taking? Authorizing Provider  ALPRAZolam (XANAX) 0.25 MG tablet Take 1 tablet (0.25 mg total) by mouth 2 (two) times daily as needed for anxiety. 04/17/18  Yes Mikhail, Maryann, DO  amLODipine ([place]) 10  MG tablet Take 10 mg by mouth daily.   Yes [provider]  aspirin EC 81 MG tablet Take 81 mg by mouth daily.   Yes [provider]  atorvastatin (LIPITOR) 10 MG tablet Take 10 mg by mouth at bedtime.  07/12/15  Yes [provider]  benazepril (LOTENSIN) 40 MG tablet Take 40 mg by mouth daily.   Yes [provider]  hydroxyurea (HYDREA) 500 MG capsule Take 1 tablet (500mg ) every MWF. Take 2 tablets (1,000mg ) all other days Patient taking differently: Take 500 mg by mouth See  admin instructions. Take 1 capsule (500mg ) every MWF. 11/01/17  Yes Ladell Pier, MD  latanoprost (XALATAN) 0.005 % ophthalmic solution Place 1 drop into both eyes at bedtime.  03/19/18  Yes [provider]  loratadine (CLARITIN) 10 MG tablet Take 10 mg by mouth daily as needed for allergies.   Yes [provider]  meclizine (ANTIVERT) 25 MG tablet Take 25 mg by mouth 3 (three) times daily as needed for dizziness.  03/25/14  Yes [provider]  oxyCODONE-acetaminophen (PERCOCET/ROXICET) 5-325 MG tablet Take 1 tablet by mouth every 4 (four) hours as needed for severe pain. 04/17/18  Yes Mikhail, Velta Addison, DO  tiZANidine (ZANAFLEX) 2 MG tablet Take 1 tablet (2 mg total) by mouth every 6 (six) hours as needed. Patient taking differently: Take 2 mg by mouth 3 (three) times daily.  02/04/18  Yes Hardin Negus, Eric K, PA-C  TRAVATAN Z 0.004 % SOLN ophthalmic solution Place 1 drop into both eyes at bedtime. 05/26/15  Yes [provider]  cephALEXin (KEFLEX) 500 MG capsule Take 1 capsule (500 mg total) by mouth 3 (three) times daily. Patient not taking: Reported on 04/20/2018 04/15/18   Charlynne Cousins, MD    Family History Family History  Problem Relation Age of Onset  . Cancer Mother   . Hypertension Father   . Asthma Sister     Social History Social History   Tobacco Use  . Smoking status: Former Smoker    Packs/day: 0.12    Types: Cigarettes    Last attempt to quit: 08/15/1991    Years since quitting: 26.7  . Smokeless tobacco: Never Used  Substance Use Topics  . Alcohol use: No    Comment: 10/12/11 "did drink in my younger; don't drink at all now"  . Drug use: No     Allergies   Patient has no known allergies.   Review of Systems Review of Systems  Constitutional: Negative for appetite change, chills and fever.  HENT: Negative for ear pain, rhinorrhea, sneezing and sore throat.   Eyes: Negative for photophobia and visual disturbance.  Respiratory:  Negative for cough, chest tightness, shortness of breath and wheezing.   Cardiovascular: Negative for chest pain and palpitations.  Gastrointestinal: Negative for abdominal pain, blood in stool, constipation, diarrhea, nausea and vomiting.  Genitourinary: Negative for dysuria, hematuria and urgency.  Musculoskeletal: Negative for myalgias.  Skin: Positive for wound. Negative for rash.  Neurological: Negative for dizziness, weakness and light-headedness.     Physical Exam Updated Vital Signs BP 130/80   Pulse 81   Temp 98.8 F (37.1 C) (Oral)   Resp 12   SpO2 100%   Physical Exam  Constitutional: She appears well-developed and well-nourished. No distress.  HENT:  Head: Normocephalic and atraumatic.  Nose: Nose normal.  Eyes: Conjunctivae and EOM are normal. Left eye exhibits no discharge. No scleral icterus.  Neck: Normal range of motion. Neck supple.  Cardiovascular: Normal rate, regular rhythm, normal heart sounds and intact distal pulses. Exam reveals no gallop and no friction rub.  No murmur heard. Pulmonary/Chest: Effort normal and breath sounds normal. No respiratory distress.  Abdominal: Soft. Bowel sounds are normal. She exhibits no distension. There is no tenderness. There is no guarding.  Musculoskeletal: Normal range of motion. She exhibits no edema.  No changes to range of motion of right hip.  Neurological: She is alert. She exhibits normal muscle tone. Coordination normal.  Skin: Skin is warm and dry. No rash noted.  1.5 cm opening of the incision site with no active bleeding noted.  Surrounding ecchymosis noted.  No specific tenderness palpation of the hip.  Psychiatric: She has a normal mood and affect.  Nursing note and vitals reviewed.      ED Treatments / Results  Labs (all labs ordered are listed, but only abnormal results are displayed) Labs Reviewed  CBC WITH DIFFERENTIAL/PLATELET    EKG None  Radiology No results  found.  Procedures Procedures (including critical care time)  Medications Ordered in ED Medications - No data to display   Initial Impression / Assessment and Plan / ED Course  I have reviewed the triage vital signs and the nursing notes.  Pertinent labs & imaging results that were available during my care of the patient were reviewed by me and considered in my medical decision making (see chart for details).     77 year old female with past medical history of polycythemia vera, hypertension, status post right hip total arthropathy revision on 6/24 presents to ED for bleeding at the incision site.  Staff at the nursing facility noticed this today while changing her depends.  She denies any changes to soreness in her right hip.  No worsening of pain noted.  Denies any recent injuries or falls.  Patient had an apparent seroma in the area which was aspirated and drained by orthopedist last week.  On exam there is no changes to range of motion noted.  There is a 1.5 cm with dehiscence of the wound noted at the incision site with no active bleeding on my exam.  CBC with platelet count of over 1000 which is similar to her baseline due to her history of polycythemia vera.  Area was dressed with bacitracin and wet-to-dry dressing.  I sent a message to her orthopedist regarding follow-up in the clinic routinely.  Will advised her to return to ED for any severe worsening symptoms. Patient discussed with and seen by my attending, Dr. Rex Kras.  Portions of this note were generated with [employer]. Dictation errors may occur despite best attempts at proofreading.   Final Clinical Impressions(s) / ED Diagnoses   Final diagnoses:  Wound dehiscence    ED Discharge Orders    None       Delia Heady, PA-C 04/21/18 0001    Little, Wenda Overland, MD 04/21/18 [phone]

## 2018-04-21 DIAGNOSIS — S7001XS Contusion of right hip, sequela: Secondary | ICD-10-CM | POA: Diagnosis not present

## 2018-04-21 DIAGNOSIS — T84090A Other mechanical complication of internal right hip prosthesis, initial encounter: Secondary | ICD-10-CM | POA: Diagnosis not present

## 2018-04-21 DIAGNOSIS — H409 Unspecified glaucoma: Secondary | ICD-10-CM | POA: Diagnosis present

## 2018-04-21 DIAGNOSIS — M25551 Pain in right hip: Secondary | ICD-10-CM | POA: Diagnosis not present

## 2018-04-21 DIAGNOSIS — S7001XA Contusion of right hip, initial encounter: Secondary | ICD-10-CM | POA: Diagnosis not present

## 2018-04-21 DIAGNOSIS — Z7401 Bed confinement status: Secondary | ICD-10-CM | POA: Diagnosis not present

## 2018-04-21 DIAGNOSIS — T8451XD Infection and inflammatory reaction due to internal right hip prosthesis, subsequent encounter: Secondary | ICD-10-CM | POA: Diagnosis not present

## 2018-04-21 DIAGNOSIS — T8451XA Infection and inflammatory reaction due to internal right hip prosthesis, initial encounter: Secondary | ICD-10-CM | POA: Diagnosis not present

## 2018-04-21 DIAGNOSIS — Z96641 Presence of right artificial hip joint: Secondary | ICD-10-CM | POA: Diagnosis not present

## 2018-04-21 DIAGNOSIS — D62 Acute posthemorrhagic anemia: Secondary | ICD-10-CM | POA: Diagnosis not present

## 2018-04-21 DIAGNOSIS — I10 Essential (primary) hypertension: Secondary | ICD-10-CM | POA: Diagnosis not present

## 2018-04-21 DIAGNOSIS — W19XXXD Unspecified fall, subsequent encounter: Secondary | ICD-10-CM | POA: Diagnosis not present

## 2018-04-21 DIAGNOSIS — I119 Hypertensive heart disease without heart failure: Secondary | ICD-10-CM | POA: Diagnosis not present

## 2018-04-21 DIAGNOSIS — E611 Iron deficiency: Secondary | ICD-10-CM | POA: Diagnosis not present

## 2018-04-21 DIAGNOSIS — M2550 Pain in unspecified joint: Secondary | ICD-10-CM | POA: Diagnosis not present

## 2018-04-21 DIAGNOSIS — T8130XD Disruption of wound, unspecified, subsequent encounter: Secondary | ICD-10-CM | POA: Diagnosis not present

## 2018-04-21 DIAGNOSIS — Z681 Body mass index (BMI) 19 or less, adult: Secondary | ICD-10-CM | POA: Diagnosis not present

## 2018-04-21 DIAGNOSIS — T8131XD Disruption of external operation (surgical) wound, not elsewhere classified, subsequent encounter: Secondary | ICD-10-CM | POA: Diagnosis not present

## 2018-04-21 DIAGNOSIS — R791 Abnormal coagulation profile: Secondary | ICD-10-CM | POA: Diagnosis not present

## 2018-04-21 DIAGNOSIS — N179 Acute kidney failure, unspecified: Secondary | ICD-10-CM | POA: Diagnosis not present

## 2018-04-21 DIAGNOSIS — Z87440 Personal history of urinary (tract) infections: Secondary | ICD-10-CM | POA: Diagnosis not present

## 2018-04-21 DIAGNOSIS — R2681 Unsteadiness on feet: Secondary | ICD-10-CM | POA: Diagnosis not present

## 2018-04-21 DIAGNOSIS — N390 Urinary tract infection, site not specified: Secondary | ICD-10-CM | POA: Diagnosis not present

## 2018-04-21 DIAGNOSIS — Z8249 Family history of ischemic heart disease and other diseases of the circulatory system: Secondary | ICD-10-CM | POA: Diagnosis not present

## 2018-04-21 DIAGNOSIS — Y831 Surgical operation with implant of artificial internal device as the cause of abnormal reaction of the patient, or of later complication, without mention of misadventure at the time of the procedure: Secondary | ICD-10-CM | POA: Diagnosis not present

## 2018-04-21 DIAGNOSIS — M6281 Muscle weakness (generalized): Secondary | ICD-10-CM | POA: Diagnosis not present

## 2018-04-21 DIAGNOSIS — Z79899 Other long term (current) drug therapy: Secondary | ICD-10-CM | POA: Diagnosis not present

## 2018-04-21 DIAGNOSIS — Y838 Other surgical procedures as the cause of abnormal reaction of the patient, or of later complication, without mention of misadventure at the time of the procedure: Secondary | ICD-10-CM | POA: Diagnosis not present

## 2018-04-21 DIAGNOSIS — S7001XD Contusion of right hip, subsequent encounter: Secondary | ICD-10-CM | POA: Diagnosis not present

## 2018-04-21 DIAGNOSIS — D473 Essential (hemorrhagic) thrombocythemia: Secondary | ICD-10-CM | POA: Diagnosis not present

## 2018-04-21 DIAGNOSIS — D45 Polycythemia vera: Secondary | ICD-10-CM | POA: Diagnosis not present

## 2018-04-21 DIAGNOSIS — W06XXXD Fall from bed, subsequent encounter: Secondary | ICD-10-CM | POA: Diagnosis not present

## 2018-04-21 DIAGNOSIS — Z87891 Personal history of nicotine dependence: Secondary | ICD-10-CM | POA: Diagnosis not present

## 2018-04-21 DIAGNOSIS — R7989 Other specified abnormal findings of blood chemistry: Secondary | ICD-10-CM | POA: Diagnosis not present

## 2018-04-21 DIAGNOSIS — E440 Moderate protein-calorie malnutrition: Secondary | ICD-10-CM | POA: Diagnosis not present

## 2018-04-21 DIAGNOSIS — T8132XA Disruption of internal operation (surgical) wound, not elsewhere classified, initial encounter: Secondary | ICD-10-CM | POA: Diagnosis not present

## 2018-04-21 DIAGNOSIS — R262 Difficulty in walking, not elsewhere classified: Secondary | ICD-10-CM | POA: Diagnosis not present

## 2018-04-21 DIAGNOSIS — Z7982 Long term (current) use of aspirin: Secondary | ICD-10-CM | POA: Diagnosis not present

## 2018-04-21 DIAGNOSIS — Z471 Aftercare following joint replacement surgery: Secondary | ICD-10-CM | POA: Diagnosis not present

## 2018-04-21 DIAGNOSIS — I959 Hypotension, unspecified: Secondary | ICD-10-CM | POA: Diagnosis not present

## 2018-04-21 MED ORDER — BACITRACIN ZINC 500 UNIT/GM EX OINT
TOPICAL_OINTMENT | CUTANEOUS | Status: AC
  Filled 2018-04-21: qty 0.9

## 2018-04-21 NOTE — ED Notes (Signed)
[facility] contacted 

## 2018-04-21 NOTE — Discharge Instructions (Signed)
Return to ED for worsening symptoms, fever, swelling of the area, pus in the area, numbness in legs, injuries or falls.

## 2018-04-22 DIAGNOSIS — I10 Essential (primary) hypertension: Secondary | ICD-10-CM | POA: Diagnosis not present

## 2018-04-22 DIAGNOSIS — D45 Polycythemia vera: Secondary | ICD-10-CM | POA: Diagnosis not present

## 2018-04-22 DIAGNOSIS — N390 Urinary tract infection, site not specified: Secondary | ICD-10-CM | POA: Diagnosis not present

## 2018-04-22 DIAGNOSIS — S7001XS Contusion of right hip, sequela: Secondary | ICD-10-CM | POA: Diagnosis not present

## 2018-04-23 ENCOUNTER — Other Ambulatory Visit: Payer: Self-pay | Admitting: Orthopedic Surgery

## 2018-04-23 ENCOUNTER — Inpatient Hospital Stay (HOSPITAL_COMMUNITY): Payer: Medicare HMO

## 2018-04-23 ENCOUNTER — Other Ambulatory Visit: Payer: Self-pay

## 2018-04-23 ENCOUNTER — Encounter (HOSPITAL_COMMUNITY): Payer: Self-pay

## 2018-04-23 ENCOUNTER — Inpatient Hospital Stay (HOSPITAL_COMMUNITY)
Admission: AD | Admit: 2018-04-23 | Discharge: 2018-05-06 | DRG: 467 | Disposition: A | Discharge status: Skilled Nursing Facility | Payer: Medicare HMO | Source: Ambulatory Visit | Attending: Orthopedic Surgery | Admitting: Orthopedic Surgery

## 2018-04-23 DIAGNOSIS — E440 Moderate protein-calorie malnutrition: Secondary | ICD-10-CM | POA: Diagnosis not present

## 2018-04-23 DIAGNOSIS — Z96641 Presence of right artificial hip joint: Secondary | ICD-10-CM | POA: Diagnosis present

## 2018-04-23 DIAGNOSIS — T8130XD Disruption of wound, unspecified, subsequent encounter: Secondary | ICD-10-CM | POA: Diagnosis not present

## 2018-04-23 DIAGNOSIS — R5381 Other malaise: Secondary | ICD-10-CM | POA: Diagnosis not present

## 2018-04-23 DIAGNOSIS — I119 Hypertensive heart disease without heart failure: Secondary | ICD-10-CM | POA: Diagnosis present

## 2018-04-23 DIAGNOSIS — W06XXXD Fall from bed, subsequent encounter: Secondary | ICD-10-CM | POA: Diagnosis not present

## 2018-04-23 DIAGNOSIS — S7001XA Contusion of right hip, initial encounter: Secondary | ICD-10-CM | POA: Diagnosis present

## 2018-04-23 DIAGNOSIS — Y831 Surgical operation with implant of artificial internal device as the cause of abnormal reaction of the patient, or of later complication, without mention of misadventure at the time of the procedure: Secondary | ICD-10-CM | POA: Diagnosis present

## 2018-04-23 DIAGNOSIS — R262 Difficulty in walking, not elsewhere classified: Secondary | ICD-10-CM | POA: Diagnosis not present

## 2018-04-23 DIAGNOSIS — H409 Unspecified glaucoma: Secondary | ICD-10-CM | POA: Diagnosis present

## 2018-04-23 DIAGNOSIS — Z8249 Family history of ischemic heart disease and other diseases of the circulatory system: Secondary | ICD-10-CM | POA: Diagnosis not present

## 2018-04-23 DIAGNOSIS — E611 Iron deficiency: Secondary | ICD-10-CM | POA: Diagnosis not present

## 2018-04-23 DIAGNOSIS — T8130XA Disruption of wound, unspecified, initial encounter: Secondary | ICD-10-CM | POA: Diagnosis not present

## 2018-04-23 DIAGNOSIS — R7989 Other specified abnormal findings of blood chemistry: Secondary | ICD-10-CM | POA: Diagnosis present

## 2018-04-23 DIAGNOSIS — D62 Acute posthemorrhagic anemia: Secondary | ICD-10-CM | POA: Diagnosis not present

## 2018-04-23 DIAGNOSIS — T8131XD Disruption of external operation (surgical) wound, not elsewhere classified, subsequent encounter: Secondary | ICD-10-CM | POA: Diagnosis not present

## 2018-04-23 DIAGNOSIS — T8451XA Infection and inflammatory reaction due to internal right hip prosthesis, initial encounter: Secondary | ICD-10-CM | POA: Diagnosis not present

## 2018-04-23 DIAGNOSIS — Z87440 Personal history of urinary (tract) infections: Secondary | ICD-10-CM

## 2018-04-23 DIAGNOSIS — S7001XD Contusion of right hip, subsequent encounter: Secondary | ICD-10-CM | POA: Diagnosis not present

## 2018-04-23 DIAGNOSIS — M2550 Pain in unspecified joint: Secondary | ICD-10-CM | POA: Diagnosis not present

## 2018-04-23 DIAGNOSIS — R05 Cough: Secondary | ICD-10-CM

## 2018-04-23 DIAGNOSIS — Z7982 Long term (current) use of aspirin: Secondary | ICD-10-CM

## 2018-04-23 DIAGNOSIS — T8132XA Disruption of internal operation (surgical) wound, not elsewhere classified, initial encounter: Secondary | ICD-10-CM | POA: Diagnosis not present

## 2018-04-23 DIAGNOSIS — M25551 Pain in right hip: Secondary | ICD-10-CM | POA: Diagnosis not present

## 2018-04-23 DIAGNOSIS — R791 Abnormal coagulation profile: Secondary | ICD-10-CM | POA: Diagnosis not present

## 2018-04-23 DIAGNOSIS — Z79899 Other long term (current) drug therapy: Secondary | ICD-10-CM

## 2018-04-23 DIAGNOSIS — Z681 Body mass index (BMI) 19 or less, adult: Secondary | ICD-10-CM | POA: Diagnosis not present

## 2018-04-23 DIAGNOSIS — T84090A Other mechanical complication of internal right hip prosthesis, initial encounter: Secondary | ICD-10-CM | POA: Diagnosis not present

## 2018-04-23 DIAGNOSIS — D473 Essential (hemorrhagic) thrombocythemia: Secondary | ICD-10-CM

## 2018-04-23 DIAGNOSIS — Y838 Other surgical procedures as the cause of abnormal reaction of the patient, or of later complication, without mention of misadventure at the time of the procedure: Secondary | ICD-10-CM | POA: Diagnosis not present

## 2018-04-23 DIAGNOSIS — Z7401 Bed confinement status: Secondary | ICD-10-CM | POA: Diagnosis not present

## 2018-04-23 DIAGNOSIS — D45 Polycythemia vera: Secondary | ICD-10-CM | POA: Diagnosis not present

## 2018-04-23 DIAGNOSIS — I10 Essential (primary) hypertension: Secondary | ICD-10-CM | POA: Diagnosis not present

## 2018-04-23 DIAGNOSIS — Z471 Aftercare following joint replacement surgery: Secondary | ICD-10-CM | POA: Diagnosis not present

## 2018-04-23 DIAGNOSIS — N179 Acute kidney failure, unspecified: Secondary | ICD-10-CM | POA: Diagnosis not present

## 2018-04-23 DIAGNOSIS — H81319 Aural vertigo, unspecified ear: Secondary | ICD-10-CM | POA: Diagnosis not present

## 2018-04-23 DIAGNOSIS — W19XXXD Unspecified fall, subsequent encounter: Secondary | ICD-10-CM | POA: Diagnosis not present

## 2018-04-23 DIAGNOSIS — H44513 Absolute glaucoma, bilateral: Secondary | ICD-10-CM | POA: Diagnosis not present

## 2018-04-23 DIAGNOSIS — T8131XA Disruption of external operation (surgical) wound, not elsewhere classified, initial encounter: Secondary | ICD-10-CM | POA: Diagnosis present

## 2018-04-23 DIAGNOSIS — M6281 Muscle weakness (generalized): Secondary | ICD-10-CM | POA: Diagnosis not present

## 2018-04-23 DIAGNOSIS — Z87891 Personal history of nicotine dependence: Secondary | ICD-10-CM

## 2018-04-23 DIAGNOSIS — D75839 Thrombocytosis, unspecified: Secondary | ICD-10-CM

## 2018-04-23 DIAGNOSIS — T8451XD Infection and inflammatory reaction due to internal right hip prosthesis, subsequent encounter: Secondary | ICD-10-CM | POA: Diagnosis not present

## 2018-04-23 DIAGNOSIS — R059 Cough, unspecified: Secondary | ICD-10-CM

## 2018-04-23 LAB — CBC WITH DIFFERENTIAL/PLATELET
BAND NEUTROPHILS: 0 %
BASOS ABS: 0 10*3/uL (ref 0.0–0.1)
BASOS PCT: 0 %
BLASTS: 0 %
Basophils Absolute: 0.7 10*3/uL — ABNORMAL HIGH (ref 0.0–0.1)
Basophils Relative: 2 %
EOS ABS: 2.4 10*3/uL — AB (ref 0.0–0.7)
EOS ABS: 0.8 10*3/uL — AB (ref 0.0–0.7)
EOS PCT: 7 %
Eosinophils Relative: 2 %
HCT: 39.3 % (ref 36.0–46.0)
HCT: 39.3 % (ref 36.0–46.0)
HEMOGLOBIN: 11.5 g/dL — AB (ref 12.0–15.0)
Hemoglobin: 11.6 g/dL — ABNORMAL LOW (ref 12.0–15.0)
LYMPHS ABS: 2.1 10*3/uL (ref 0.7–4.0)
LYMPHS PCT: 4 %
Lymphocytes Relative: 5 %
Lymphs Abs: 1.4 10*3/uL (ref 0.7–4.0)
MCH: 23.3 pg — AB (ref 26.0–34.0)
MCH: 23.5 pg — ABNORMAL LOW (ref 26.0–34.0)
MCHC: 29.3 g/dL — ABNORMAL LOW (ref 30.0–36.0)
MCHC: 29.5 g/dL — ABNORMAL LOW (ref 30.0–36.0)
MCV: 79.6 fL (ref 78.0–100.0)
MCV: 79.7 fL (ref 78.0–100.0)
METAMYELOCYTES PCT: 0 %
MONOS PCT: 3 %
MONOS PCT: 2 %
Monocytes Absolute: 1 10*3/uL (ref 0.1–1.0)
Monocytes Absolute: 0.8 10*3/uL (ref 0.1–1.0)
Myelocytes: 0 %
NEUTROS ABS: 37.4 10*3/uL — AB (ref 1.7–7.7)
NRBC: 1 /100{WBCs} — AB
Neutro Abs: 28.3 10*3/uL — ABNORMAL HIGH (ref 1.7–7.7)
Neutrophils Relative %: 84 %
Neutrophils Relative %: 91 %
PLATELETS: 1911 10*3/uL — AB (ref 150–400)
Platelets: 1774 10*3/uL (ref 150–400)
Promyelocytes Relative: 0 %
RBC: 4.94 MIL/uL (ref 3.87–5.11)
RBC: 4.93 MIL/uL (ref 3.87–5.11)
RDW: 25.9 % — ABNORMAL HIGH (ref 11.5–15.5)
RDW: 25.9 % — AB (ref 11.5–15.5)
WBC: 33.8 10*3/uL — ABNORMAL HIGH (ref 4.0–10.5)
WBC: 41.1 10*3/uL — ABNORMAL HIGH (ref 4.0–10.5)
nRBC: 0 /100 WBC

## 2018-04-23 LAB — PROTIME-INR
INR: 1.38
Prothrombin Time: 16.9 seconds — ABNORMAL HIGH (ref 11.4–15.2)

## 2018-04-23 LAB — FIBRINOGEN: Fibrinogen: 613 mg/dL — ABNORMAL HIGH (ref 210–475)

## 2018-04-23 LAB — COMPREHENSIVE METABOLIC PANEL
ALBUMIN: 3 g/dL — AB (ref 3.5–5.0)
ALT: 22 U/L (ref 0–44)
AST: 25 U/L (ref 15–41)
Alkaline Phosphatase: 128 U/L — ABNORMAL HIGH (ref 38–126)
Anion gap: 8 (ref 5–15)
BUN: 15 mg/dL (ref 8–23)
CALCIUM: 9.1 mg/dL (ref 8.9–10.3)
CO2: 28 mmol/L (ref 22–32)
Chloride: 103 mmol/L (ref 98–111)
Creatinine, Ser: 0.85 mg/dL (ref 0.44–1.00)
GFR calc Af Amer: >60 mL/min (ref >60)
GLUCOSE: 111 mg/dL — AB (ref 70–99)
POTASSIUM: 4 mmol/L (ref 3.5–5.1)
SODIUM: 139 mmol/L (ref 135–145)
TOTAL PROTEIN: 8.1 g/dL (ref 6.5–8.1)
Total Bilirubin: 0.8 mg/dL (ref 0.3–1.2)

## 2018-04-23 LAB — APTT: APTT: 38 s — AB (ref 24–36)

## 2018-04-23 LAB — SEDIMENTATION RATE: Sed Rate: 24 mm/hr — ABNORMAL HIGH (ref 0–22)

## 2018-04-23 LAB — C-REACTIVE PROTEIN: CRP: 3.5 mg/dL — AB (ref <1.0)

## 2018-04-23 MED ORDER — TRANEXAMIC ACID 1000 MG/10ML IV SOLN
1000.0000 mg | INTRAVENOUS | Status: AC
  Administered 2018-04-24: 1000 mg via INTRAVENOUS
  Filled 2018-04-23 (×2): qty 10

## 2018-04-23 MED ORDER — TRANEXAMIC ACID 1000 MG/10ML IV SOLN
2000.0000 mg | INTRAVENOUS | Status: DC
  Filled 2018-04-23: qty 20

## 2018-04-23 MED ORDER — OXYCODONE-ACETAMINOPHEN 5-325 MG PO TABS
1.0000 | ORAL_TABLET | ORAL | Status: DC | PRN
  Administered 2018-04-23: 1 via ORAL
  Filled 2018-04-23: qty 1

## 2018-04-23 MED ORDER — POVIDONE-IODINE 10 % EX SWAB: 2.0000 "application " | Freq: Once | CUTANEOUS | Status: DC

## 2018-04-23 MED ORDER — IOHEXOL 300 MG/ML  SOLN
100.0000 mL | Freq: Once | INTRAMUSCULAR | Status: AC | PRN
  Administered 2018-04-23: 100 mL via INTRAVENOUS

## 2018-04-23 MED ORDER — TRANEXAMIC ACID 1000 MG/10ML IV SOLN: 1000.0000 mg | INTRAVENOUS | Status: DC

## 2018-04-23 MED ORDER — CEFAZOLIN SODIUM-DEXTROSE 2-4 GM/100ML-% IV SOLN: 2.0000 g | INTRAVENOUS | Status: DC

## 2018-04-23 MED ORDER — VITAMIN K1 10 MG/ML IJ SOLN
10.0000 mg | Freq: Two times a day (BID) | INTRAMUSCULAR | Status: AC
  Administered 2018-04-23 – 2018-04-24 (×3): 10 mg via SUBCUTANEOUS
  Filled 2018-04-23 (×3): qty 1

## 2018-04-23 MED ORDER — DEXTROSE-NACL 5-0.45 % IV SOLN
INTRAVENOUS | Status: DC
  Administered 2018-04-23 – 2018-04-24 (×3): via INTRAVENOUS

## 2018-04-23 MED ORDER — CHLORHEXIDINE GLUCONATE 4 % EX LIQD
60.0000 mL | Freq: Once | CUTANEOUS | Status: AC
  Administered 2018-04-24: 4 via TOPICAL
  Filled 2018-04-23: qty 15

## 2018-04-23 NOTE — H&P (Signed)
Alejandra [name] is an 77 y.o. female.   Chief Complaint: Right hip pain   HPI: [name] returns today with dehiscence of the proximal aspect of her right revision hip wound.  To recap she had removal of a loose cemented stem and revision to a Pinnacle shell with SROM stem.  On 02/04/18.  Her history is complicated by try clonal polycythemia vera and in essence she has platelet counts exceeding one half million, but the platelets do not work.  Her platelet function was unknown.  Prior to this procedure.  Afterwards she had some significant drainage from the wound for a week or 2.  We did send off a good culture specimen that grew out no bacteria from the wound drainage and she was not on antibiotics at the time.  She was actually admitted to the hospital about 10 days ago with a UTI and dehydration and during that period of time we aspirated another 50 cc of blood from the wound.  The did not appear to be purulent.  She has never had any significant fever.  Her peripheral white count always runs around 30-40,000 and that is of no help.  She does report some increased pain in the right hip and low back.  On today's visit.  Past Medical History:  Diagnosis Date  . Arthritis   . Avascular necrosis of femur head, right ([place]) 04/23/2012  . Cellulitis of shoulder 09/28/2014  . Fracture 10/05/11   "fell and broke right hip"  . Fracture of femoral neck, right ([place]) 10/12/2011  . Glaucoma   . Glaucoma    bilateral  . Hypertension   . Polycythemia vera(238.4)   . Rhabdomyolysis 09/28/2014  . Thrombocytosis ([place]) 04/23/2012    Past Surgical History:  Procedure Laterality Date  . COLONOSCOPY    . HARDWARE REMOVAL  04/23/2012   Procedure: HARDWARE REMOVAL;  Surgeon: Johnny Bridge, MD;  Location: [facility];  Service: Orthopedics;  Laterality: Right;  . HIP ARTHROPLASTY  04/23/2012   Procedure: ARTHROPLASTY BIPOLAR HIP;  Surgeon: Johnny Bridge, MD;  Location: [facility];  Service: Orthopedics;  Laterality: Right;  .  HIP PINNING,CANNULATED  10/12/2011   Procedure: CANNULATED HIP PINNING;  Surgeon: Johnny Bridge, MD;  Location: [facility];  Service: Orthopedics;  Laterality: Right;  . TOTAL HIP REVISION Right 02/04/2018   Procedure: RIGHT TOTAL HIP REVISION: REVISION OF RIGHT MONOPOLAR/CONVERSION TO TOTAL HIP;  Surgeon: Frederik Pear, MD;  [facility];  Service: Orthopedics;  Laterality: Right;  Requesting 2 hours    Family History  Problem Relation Age of Onset  . Cancer Mother   . Hypertension Father   . Asthma Sister    Social History:  reports that she quit smoking about 26 years ago. Her smoking use included cigarettes. She smoked 0.12 packs per day. She has never used smokeless tobacco. She reports that she does not drink alcohol or use drugs.  Allergies: No Known Allergies  Medications Prior to Admission  Medication Sig Dispense Refill  . ALPRAZolam (XANAX) 0.25 MG tablet Take 1 tablet (0.25 mg total) by mouth 2 (two) times daily as needed for anxiety. 10 tablet 0  . amLODipine (NORVASC) 10 MG tablet Take 10 mg by mouth daily.    Marland Kitchen aspirin EC 81 MG tablet Take 81 mg by mouth daily.    Marland Kitchen atorvastatin (LIPITOR) 10 MG tablet Take 10 mg by mouth at bedtime.   5  . benazepril (LOTENSIN) 40 MG tablet Take 40 mg  by mouth daily.    . cephALEXin (KEFLEX) 500 MG capsule Take 1 capsule (500 mg total) by mouth 3 (three) times daily. (Patient not taking: Reported on 04/20/2018) 9 capsule 0  . hydroxyurea (HYDREA) 500 MG capsule Take 1 tablet (500mg ) every MWF. Take 2 tablets (1,000mg ) all other days (Patient taking differently: Take 500 mg by mouth See admin instructions. Take 1 capsule (500mg ) every MWF.) 44 capsule 0  . latanoprost (XALATAN) 0.005 % ophthalmic solution Place 1 drop into both eyes at bedtime.     Marland Kitchen loratadine (CLARITIN) 10 MG tablet Take 10 mg by mouth daily as needed for allergies.    Marland Kitchen meclizine (ANTIVERT) 25 MG tablet Take 25 mg by mouth 3 (three) times daily as needed for dizziness.   1  .  oxyCODONE-acetaminophen (PERCOCET/ROXICET) 5-325 MG tablet Take 1 tablet by mouth every 4 (four) hours as needed for severe pain. 10 tablet 0  . tiZANidine (ZANAFLEX) 2 MG tablet Take 1 tablet (2 mg total) by mouth every 6 (six) hours as needed. (Patient taking differently: Take 2 mg by mouth 3 (three) times daily. ) 60 tablet 0  . TRAVATAN Z 0.004 % SOLN ophthalmic solution Place 1 drop into both eyes at bedtime.      Results for orders placed or performed during the hospital encounter of 04/23/18 (from the past 48 hour(s))  C-reactive protein     Status: Abnormal   Collection Time: 04/23/18 12:49 PM  Result Value Ref Range   CRP 3.5 (H) <1.0 mg/dL    Comment: Performed at [facility], [place] [address], [place], [place] [address]   No results found.  Review of Systems  Constitutional: Negative.   HENT: Positive for congestion and sinus pain.   Eyes: Negative.   Respiratory: Negative.   Cardiovascular: Negative.   Gastrointestinal: Negative.   Genitourinary: Negative.   Musculoskeletal: Positive for joint pain.  Skin: Negative.   Neurological: Negative.   Endo/Heme/Allergies: Bruises/bleeds easily.  Psychiatric/Behavioral: Negative.     There were no vitals taken for this visit. Physical Exam  Constitutional: She is oriented to person, place, and time. She appears well-developed and well-nourished.  HENT:  Head: Normocephalic and atraumatic.  Eyes: Pupils are equal, round, and reactive to light.  Neck: Normal range of motion. Neck supple.  Cardiovascular: Intact distal pulses.  Respiratory: Effort normal.  Musculoskeletal: She exhibits tenderness.  Patient is placed on the exam table in the proximal aspect of her right hip wound does have a 1/2 cm area of dehiscence and I was able to express dark bloody fluid from the wound at this time.  Internal and external rotation does not bother her.  Previous x-rays have shown the hip to be well placed and well fixed.   She is neurovascular intact distally.  She is minimally tender along the lumbar spine.  Neurological: She is alert and oriented to person, place, and time.  Skin: Skin is warm and dry.  Psychiatric: She has a normal mood and affect. Her behavior is normal. Judgment and thought content normal.     Assessment/Plan Assess: Status post complex revision right total up arthroplasty 08/15/70 complicated by wound hematoma secondary to try clonal polycythemia vera with poor platelet function.  Despite platelet counts over one half million.  Plan: We will get her admitted to the hospital today.  Consultation with hematology and the hospitalist.  Plan is to take her for irrigation and debridement tomorrow with placement of a wound VAC.  Preoperatively, she will need platelet transfusion and probably DDAVP per Dr. Benay Spice her hematologist.  We will also get another set of deep cultures.  Joanell Rising, PA-C 04/23/2018, 1:43 PM

## 2018-04-23 NOTE — Progress Notes (Signed)
I have received consult from orthopedic surgery for perioperative anticoagulation management due to wound dehiscence and chronic bleeding, and patient with clotting disorder/extreme thrombocytosis, a patient of Dr. Benay Spice. I spoke with him briefly.  I will order coagulation studies as discussed. If the patient has excessive bleeding tonight, platelet transfusion would be indicated. If not, Dr. Benay Spice has plan to order perioperative platelet transfusion along with DDAVP, as outlined in his consult note dated on April 12, 2018. Please call if questions arise. He will see the patient personally tomorrow morning before 8 AM

## 2018-04-24 ENCOUNTER — Encounter (HOSPITAL_COMMUNITY)
Admission: AD | Disposition: A | Discharge status: Skilled Nursing Facility | Payer: Self-pay | Source: Ambulatory Visit | Attending: Orthopedic Surgery

## 2018-04-24 ENCOUNTER — Encounter (HOSPITAL_COMMUNITY): Payer: Self-pay

## 2018-04-24 ENCOUNTER — Inpatient Hospital Stay (HOSPITAL_COMMUNITY): Admission: RE | Admit: 2018-04-24 | Payer: Medicare HMO | Source: Ambulatory Visit | Admitting: Orthopedic Surgery

## 2018-04-24 ENCOUNTER — Inpatient Hospital Stay (HOSPITAL_COMMUNITY): Payer: Medicare HMO | Admitting: Certified Registered Nurse Anesthetist

## 2018-04-24 DIAGNOSIS — D45 Polycythemia vera: Secondary | ICD-10-CM

## 2018-04-24 DIAGNOSIS — D473 Essential (hemorrhagic) thrombocythemia: Secondary | ICD-10-CM

## 2018-04-24 DIAGNOSIS — T8131XD Disruption of external operation (surgical) wound, not elsewhere classified, subsequent encounter: Secondary | ICD-10-CM

## 2018-04-24 DIAGNOSIS — I10 Essential (primary) hypertension: Secondary | ICD-10-CM

## 2018-04-24 DIAGNOSIS — W06XXXD Fall from bed, subsequent encounter: Secondary | ICD-10-CM

## 2018-04-24 DIAGNOSIS — S7001XD Contusion of right hip, subsequent encounter: Secondary | ICD-10-CM

## 2018-04-24 DIAGNOSIS — E440 Moderate protein-calorie malnutrition: Secondary | ICD-10-CM

## 2018-04-24 HISTORY — PX: I & D EXTREMITY: SHX5045

## 2018-04-24 LAB — SURGICAL PCR SCREEN
MRSA, PCR: NEGATIVE
Staphylococcus aureus: NEGATIVE

## 2018-04-24 LAB — PROTIME-INR
INR: 1.37
Prothrombin Time: 16.7 seconds — ABNORMAL HIGH (ref 11.4–15.2)

## 2018-04-24 LAB — HEMOGLOBIN AND HEMATOCRIT, BLOOD
HCT: 27.7 % — ABNORMAL LOW (ref 36.0–46.0)
Hemoglobin: 8 g/dL — ABNORMAL LOW (ref 12.0–15.0)

## 2018-04-24 LAB — PREPARE RBC (CROSSMATCH)

## 2018-04-24 LAB — APTT: aPTT: 36 seconds (ref 24–36)

## 2018-04-24 SURGERY — IRRIGATION AND DEBRIDEMENT EXTREMITY
Anesthesia: General | Site: Hip | Laterality: Right

## 2018-04-24 MED ORDER — PROPOFOL 10 MG/ML IV BOLUS
INTRAVENOUS | Status: DC | PRN
  Administered 2018-04-24: 110 mg via INTRAVENOUS

## 2018-04-24 MED ORDER — METHOCARBAMOL 500 MG IVPB - SIMPLE MED
500.0000 mg | Freq: Four times a day (QID) | INTRAVENOUS | Status: DC | PRN
  Administered 2018-04-24: 500 mg via INTRAVENOUS
  Filled 2018-04-24: qty 50
  Filled 2018-04-24: qty 500

## 2018-04-24 MED ORDER — DIPHENHYDRAMINE HCL 12.5 MG/5ML PO ELIX: 12.5000 mg | ORAL_SOLUTION | ORAL | Status: DC | PRN

## 2018-04-24 MED ORDER — CEFAZOLIN SODIUM-DEXTROSE 2-4 GM/100ML-% IV SOLN
2.0000 g | Freq: Once | INTRAVENOUS | Status: AC
  Administered 2018-04-24: 2 g via INTRAVENOUS

## 2018-04-24 MED ORDER — SODIUM CHLORIDE 0.9 % IV SOLN
0.3000 ug/kg | INTRAVENOUS | Status: AC
  Administered 2018-04-24: 14.8 ug via INTRAVENOUS
  Filled 2018-04-24: qty 3.7

## 2018-04-24 MED ORDER — STERILE WATER FOR IRRIGATION IR SOLN
Status: DC | PRN
  Administered 2018-04-24: 1000 mL

## 2018-04-24 MED ORDER — SODIUM CHLORIDE 0.9 % IR SOLN
Status: DC | PRN
  Administered 2018-04-24: 1000 mL

## 2018-04-24 MED ORDER — PANTOPRAZOLE SODIUM 40 MG PO TBEC
40.0000 mg | DELAYED_RELEASE_TABLET | Freq: Every day | ORAL | Status: DC
  Administered 2018-04-24 – 2018-05-06 (×13): 40 mg via ORAL
  Filled 2018-04-24 (×13): qty 1

## 2018-04-24 MED ORDER — HYDROMORPHONE HCL 1 MG/ML IJ SOLN
0.5000 mg | INTRAMUSCULAR | Status: DC | PRN
  Administered 2018-04-24: 0.5 mg via INTRAVENOUS
  Administered 2018-05-03: 1 mg via INTRAVENOUS
  Filled 2018-04-24 (×2): qty 1

## 2018-04-24 MED ORDER — TIZANIDINE HCL 2 MG PO TABS: 2.0000 mg | ORAL_TABLET | Freq: Four times a day (QID) | ORAL | 0 refills | Status: DC | PRN

## 2018-04-24 MED ORDER — LATANOPROST 0.005 % OP SOLN: 1.0000 [drp] | Freq: Every day | OPHTHALMIC | Status: DC

## 2018-04-24 MED ORDER — AMLODIPINE BESYLATE 10 MG PO TABS
10.0000 mg | ORAL_TABLET | Freq: Every day | ORAL | Status: DC
  Administered 2018-04-24 – 2018-05-06 (×13): 10 mg via ORAL
  Filled 2018-04-24 (×14): qty 1

## 2018-04-24 MED ORDER — SODIUM CHLORIDE 0.9 % IR SOLN
Status: DC | PRN
  Administered 2018-04-24: 3000 mL
  Administered 2018-04-24: 1000 mL

## 2018-04-24 MED ORDER — ALUM & MAG HYDROXIDE-SIMETH 200-200-20 MG/5ML PO SUSP: 30.0000 mL | ORAL | Status: DC | PRN

## 2018-04-24 MED ORDER — DOCUSATE SODIUM 100 MG PO CAPS
100.0000 mg | ORAL_CAPSULE | Freq: Two times a day (BID) | ORAL | Status: DC
  Administered 2018-04-24 – 2018-05-06 (×23): 100 mg via ORAL
  Filled 2018-04-24 (×24): qty 1

## 2018-04-24 MED ORDER — ONDANSETRON HCL 4 MG/2ML IJ SOLN
INTRAMUSCULAR | Status: DC | PRN
  Administered 2018-04-24: 4 mg via INTRAVENOUS

## 2018-04-24 MED ORDER — ONDANSETRON HCL 4 MG PO TABS: 4.0000 mg | ORAL_TABLET | Freq: Four times a day (QID) | ORAL | Status: DC | PRN

## 2018-04-24 MED ORDER — BENAZEPRIL HCL 10 MG PO TABS
40.0000 mg | ORAL_TABLET | Freq: Every day | ORAL | Status: DC
  Administered 2018-04-24 – 2018-05-06 (×13): 40 mg via ORAL
  Filled 2018-04-24 (×14): qty 4

## 2018-04-24 MED ORDER — ROCURONIUM BROMIDE 10 MG/ML (PF) SYRINGE
PREFILLED_SYRINGE | INTRAVENOUS | Status: DC | PRN
  Administered 2018-04-24: 10 mg via INTRAVENOUS
  Administered 2018-04-24: 30 mg via INTRAVENOUS

## 2018-04-24 MED ORDER — DEXAMETHASONE SODIUM PHOSPHATE 10 MG/ML IJ SOLN
INTRAMUSCULAR | Status: DC | PRN
  Administered 2018-04-24: 10 mg via INTRAVENOUS

## 2018-04-24 MED ORDER — BUPIVACAINE-EPINEPHRINE (PF) 0.5% -1:200000 IJ SOLN
INTRAMUSCULAR | Status: DC | PRN
  Administered 2018-04-24: 30 mL

## 2018-04-24 MED ORDER — MECLIZINE HCL 25 MG PO TABS: 25.0000 mg | ORAL_TABLET | Freq: Three times a day (TID) | ORAL | Status: DC | PRN

## 2018-04-24 MED ORDER — SODIUM CHLORIDE 0.9 % IV SOLN
2.0000 g | Freq: Two times a day (BID) | INTRAVENOUS | Status: DC
  Administered 2018-04-24 – 2018-05-06 (×24): 2 g via INTRAVENOUS
  Filled 2018-04-24 (×25): qty 2

## 2018-04-24 MED ORDER — SODIUM CHLORIDE 0.9% IV SOLUTION
Freq: Once | INTRAVENOUS | Status: AC
  Administered 2018-04-24: 22:00:00 via INTRAVENOUS

## 2018-04-24 MED ORDER — ACETAMINOPHEN 325 MG PO TABS
325.0000 mg | ORAL_TABLET | Freq: Four times a day (QID) | ORAL | Status: DC | PRN
  Administered 2018-04-27 – 2018-05-05 (×7): 650 mg via ORAL
  Filled 2018-04-24 (×7): qty 2

## 2018-04-24 MED ORDER — METHOCARBAMOL 500 MG PO TABS
500.0000 mg | ORAL_TABLET | Freq: Four times a day (QID) | ORAL | Status: DC | PRN
  Administered 2018-04-25 – 2018-05-05 (×10): 500 mg via ORAL
  Filled 2018-04-24 (×11): qty 1

## 2018-04-24 MED ORDER — ALBUMIN HUMAN 5 % IV SOLN
INTRAVENOUS | Status: DC | PRN
  Administered 2018-04-24: 15:00:00 via INTRAVENOUS

## 2018-04-24 MED ORDER — LORATADINE 10 MG PO TABS: 10.0000 mg | ORAL_TABLET | Freq: Every day | ORAL | Status: DC | PRN

## 2018-04-24 MED ORDER — FENTANYL CITRATE (PF) 250 MCG/5ML IJ SOLN
INTRAMUSCULAR | Status: DC | PRN
  Administered 2018-04-24: 50 ug via INTRAVENOUS
  Administered 2018-04-24: 25 ug via INTRAVENOUS
  Administered 2018-04-24: 50 ug via INTRAVENOUS
  Administered 2018-04-24: 100 ug via INTRAVENOUS
  Administered 2018-04-24: 25 ug via INTRAVENOUS
  Administered 2018-04-24: 50 ug via INTRAVENOUS
  Administered 2018-04-24: 25 ug via INTRAVENOUS
  Administered 2018-04-24: 50 ug via INTRAVENOUS
  Administered 2018-04-24: 25 ug via INTRAVENOUS
  Administered 2018-04-24: 50 ug via INTRAVENOUS
  Administered 2018-04-24 (×2): 25 ug via INTRAVENOUS

## 2018-04-24 MED ORDER — ONDANSETRON HCL 4 MG/2ML IJ SOLN
4.0000 mg | Freq: Four times a day (QID) | INTRAMUSCULAR | Status: DC | PRN
  Administered 2018-04-24: 4 mg via INTRAVENOUS
  Filled 2018-04-24: qty 2

## 2018-04-24 MED ORDER — POLYETHYLENE GLYCOL 3350 17 G PO PACK: 17.0000 g | PACK | Freq: Every day | ORAL | Status: DC | PRN

## 2018-04-24 MED ORDER — LATANOPROST 0.005 % OP SOLN
1.0000 [drp] | Freq: Every day | OPHTHALMIC | Status: DC
  Administered 2018-04-24 – 2018-05-05 (×12): 1 [drp] via OPHTHALMIC
  Filled 2018-04-24: qty 2.5

## 2018-04-24 MED ORDER — LACTATED RINGERS IV SOLN
INTRAVENOUS | Status: DC
  Administered 2018-04-24: 1000 mL via INTRAVENOUS

## 2018-04-24 MED ORDER — FENTANYL CITRATE (PF) 100 MCG/2ML IJ SOLN
INTRAMUSCULAR | Status: AC
  Filled 2018-04-24: qty 2

## 2018-04-24 MED ORDER — OXYCODONE HCL 5 MG PO TABS
5.0000 mg | ORAL_TABLET | ORAL | Status: DC | PRN
  Administered 2018-04-25: 5 mg via ORAL
  Administered 2018-04-26: 10 mg via ORAL
  Administered 2018-04-27 – 2018-05-06 (×12): 5 mg via ORAL
  Filled 2018-04-24 (×12): qty 1
  Filled 2018-04-24: qty 2
  Filled 2018-04-24: qty 1

## 2018-04-24 MED ORDER — LACTATED RINGERS IV SOLN
INTRAVENOUS | Status: DC | PRN
  Administered 2018-04-24 (×3): via INTRAVENOUS

## 2018-04-24 MED ORDER — TRANEXAMIC ACID 1000 MG/10ML IV SOLN
INTRAVENOUS | Status: DC | PRN
  Administered 2018-04-24: 2000 mg via TOPICAL

## 2018-04-24 MED ORDER — ACETAMINOPHEN 500 MG PO TABS
1000.0000 mg | ORAL_TABLET | Freq: Four times a day (QID) | ORAL | Status: AC
  Administered 2018-04-24 – 2018-04-25 (×4): 1000 mg via ORAL
  Filled 2018-04-24 (×4): qty 2

## 2018-04-24 MED ORDER — KCL IN DEXTROSE-NACL 20-5-0.45 MEQ/L-%-% IV SOLN
INTRAVENOUS | Status: DC
  Administered 2018-04-24 – 2018-05-03 (×8): via INTRAVENOUS
  Filled 2018-04-24 (×11): qty 1000

## 2018-04-24 MED ORDER — VANCOMYCIN HCL IN DEXTROSE 1-5 GM/200ML-% IV SOLN
1000.0000 mg | INTRAVENOUS | Status: DC
  Administered 2018-04-24: 1000 mg via INTRAVENOUS
  Filled 2018-04-24 (×2): qty 200

## 2018-04-24 MED ORDER — HYDROGEN PEROXIDE 3 % EX SOLN
CUTANEOUS | Status: DC | PRN
  Administered 2018-04-24: 1

## 2018-04-24 MED ORDER — HYDROXYUREA 500 MG PO CAPS: 500.0000 mg | ORAL_CAPSULE | ORAL | Status: DC

## 2018-04-24 MED ORDER — METOCLOPRAMIDE HCL 5 MG/ML IJ SOLN: 5.0000 mg | Freq: Three times a day (TID) | INTRAMUSCULAR | Status: DC | PRN

## 2018-04-24 MED ORDER — PHENOL 1.4 % MT LIQD
1.0000 | OROMUCOSAL | Status: DC | PRN
  Filled 2018-04-24: qty 177

## 2018-04-24 MED ORDER — GABAPENTIN 300 MG PO CAPS
300.0000 mg | ORAL_CAPSULE | Freq: Three times a day (TID) | ORAL | Status: DC
  Administered 2018-04-24 – 2018-05-06 (×35): 300 mg via ORAL
  Filled 2018-04-24 (×35): qty 1

## 2018-04-24 MED ORDER — FLEET ENEMA 7-19 GM/118ML RE ENEM: 1.0000 | ENEMA | Freq: Once | RECTAL | Status: DC | PRN

## 2018-04-24 MED ORDER — DEXAMETHASONE SODIUM PHOSPHATE 10 MG/ML IJ SOLN
10.0000 mg | Freq: Once | INTRAMUSCULAR | Status: AC
  Administered 2018-04-25: 10 mg via INTRAVENOUS
  Filled 2018-04-24: qty 1

## 2018-04-24 MED ORDER — SUGAMMADEX SODIUM 200 MG/2ML IV SOLN
INTRAVENOUS | Status: DC | PRN
  Administered 2018-04-24: 120 mg via INTRAVENOUS

## 2018-04-24 MED ORDER — FENTANYL CITRATE (PF) 100 MCG/2ML IJ SOLN
25.0000 ug | INTRAMUSCULAR | Status: DC | PRN
  Administered 2018-04-24: 50 ug via INTRAVENOUS

## 2018-04-24 MED ORDER — METOCLOPRAMIDE HCL 5 MG PO TABS: 5.0000 mg | ORAL_TABLET | Freq: Three times a day (TID) | ORAL | Status: DC | PRN

## 2018-04-24 MED ORDER — PHENYLEPHRINE 40 MCG/ML (10ML) SYRINGE FOR IV PUSH (FOR BLOOD PRESSURE SUPPORT)
PREFILLED_SYRINGE | INTRAVENOUS | Status: DC | PRN
  Administered 2018-04-24 (×6): 80 ug via INTRAVENOUS
  Administered 2018-04-24: 40 ug via INTRAVENOUS

## 2018-04-24 MED ORDER — TRANEXAMIC ACID 1000 MG/10ML IV SOLN
1000.0000 mg | Freq: Once | INTRAVENOUS | Status: AC
  Administered 2018-04-24: 1000 mg via INTRAVENOUS
  Filled 2018-04-24: qty 1000

## 2018-04-24 MED ORDER — OXYCODONE-ACETAMINOPHEN 5-325 MG PO TABS: 1.0000 | ORAL_TABLET | ORAL | 0 refills | Status: DC | PRN

## 2018-04-24 MED ORDER — ALPRAZOLAM 0.25 MG PO TABS: 0.2500 mg | ORAL_TABLET | Freq: Two times a day (BID) | ORAL | Status: DC | PRN

## 2018-04-24 MED ORDER — BISACODYL 5 MG PO TBEC: 5.0000 mg | DELAYED_RELEASE_TABLET | Freq: Every day | ORAL | Status: DC | PRN

## 2018-04-24 MED ORDER — MENTHOL 3 MG MT LOZG: 1.0000 | LOZENGE | OROMUCOSAL | Status: DC | PRN

## 2018-04-24 MED ORDER — OXYCHLOROSENE SODIUM POWD
Status: DC | PRN
  Administered 2018-04-24: 1

## 2018-04-24 SURGICAL SUPPLY — 38 items
BALL HIP 32MM PLUS 3 (Hips) IMPLANT
BNDG COHESIVE 6X5 TAN STRL LF (GAUZE/BANDAGES/DRESSINGS) ×1 IMPLANT
BRUSH FEMORAL CANAL (MISCELLANEOUS) ×1 IMPLANT
COVER SURGICAL LIGHT HANDLE (MISCELLANEOUS) ×3 IMPLANT
DRAPE ORTHO SPLIT 77X108 STRL (DRAPES) ×4
DRAPE SHEET LG 3/4 BI-LAMINATE (DRAPES) ×3 IMPLANT
DRAPE SURG ORHT 6 SPLT 77X108 (DRAPES) IMPLANT
ELECT REM PT RETURN 15FT ADLT (MISCELLANEOUS) ×1 IMPLANT
EVACUATOR 1/8 PVC DRAIN (DRAIN) ×2 IMPLANT
GAUZE SPONGE 4X4 12PLY STRL (GAUZE/BANDAGES/DRESSINGS) ×1 IMPLANT
GLOVE BIO SURGEON STRL SZ7 (GLOVE) ×5 IMPLANT
GLOVE BIO SURGEON STRL SZ7.5 (GLOVE) ×5 IMPLANT
GLOVE BIOGEL PI IND STRL 7.0 (GLOVE) ×1 IMPLANT
GLOVE BIOGEL PI IND STRL 8 (GLOVE) ×1 IMPLANT
GLOVE BIOGEL PI INDICATOR 7.0 (GLOVE) ×4
GLOVE BIOGEL PI INDICATOR 8 (GLOVE) ×4
GOWN STRL REUS W/ TWL XL LVL3 (GOWN DISPOSABLE) IMPLANT
GOWN STRL REUS W/TWL XL LVL3 (GOWN DISPOSABLE) ×10
HANDPIECE INTERPULSE COAX TIP (DISPOSABLE) ×2
HIP BALL 32MM PLUS 3 (Hips) ×2 IMPLANT
HOOD PEEL AWAY FLYTE STAYCOOL (MISCELLANEOUS) ×4 IMPLANT
LINER PINN ALTRX ACE P4 HIP (Liner) ×1 IMPLANT
PACK TOTAL JOINT (CUSTOM PROCEDURE TRAY) ×1 IMPLANT
POSITIONER SURGICAL ARM (MISCELLANEOUS) ×2 IMPLANT
SET HNDPC FAN SPRY TIP SCT (DISPOSABLE) IMPLANT
SPONGE LAP 18X18 RF (DISPOSABLE) ×2 IMPLANT
STEM FEM MOD STD 36 13X18X160 (Hips) ×1 IMPLANT
STOCKINETTE 6  STRL (DRAPES) ×1
STOCKINETTE 6 STRL (DRAPES) IMPLANT
SUT ETHILON 2 0 PS N (SUTURE) ×2 IMPLANT
SUT VIC AB 0 CT1 36 (SUTURE) ×2 IMPLANT
SUT VIC AB 2-0 CT1 27 (SUTURE) ×4
SUT VIC AB 2-0 CT1 TAPERPNT 27 (SUTURE) IMPLANT
SUT VIC AB 3-0 CT1 27 (SUTURE) ×2
SUT VIC AB 3-0 CT1 TAPERPNT 27 (SUTURE) IMPLANT
TAPE CLOTH SURG 4X10 WHT LF (GAUZE/BANDAGES/DRESSINGS) ×1 IMPLANT
TRAY CATH 16FR W/PLASTIC CATH (SET/KITS/TRAYS/PACK) ×1 IMPLANT
TRAY FOLEY CATH 14FR (SET/KITS/TRAYS/PACK) ×1 IMPLANT

## 2018-04-24 NOTE — Progress Notes (Signed)
Initial Nutrition Assessment  DOCUMENTATION CODES:   Non-severe (moderate) malnutrition in context of acute illness/injury  INTERVENTION:   Diet advancement per MD Once diet is advanced, provide Ensure Enlive po BID, each supplement provides 350 kcal and 20 grams of protein  NUTRITION DIAGNOSIS:   Moderate Malnutrition related to wound healing, post-op healing, acute illness as evidenced by severe fat depletion, moderate muscle depletion.  GOAL:   Patient will meet greater than or equal to 90% of their needs  MONITOR:   Diet advancement, Labs, Weight trends, I & O's, Skin  REASON FOR ASSESSMENT:   (Wound report)    ASSESSMENT:   77 y.o. female with a past medical history of polycythemia vera, hypertension, status post right hip total arthropathy revision on 6/24 by Dr. Mayer Camel, who presents to ED for evaluation of bleeding from incision site.   Patient currently NPO pending I&D of right hip wound. Pt was at SNF PTA and states she wasn't eating well there. Reports poor appetite. States that her appetite was better yesterday and consumed 75-100% of meals. Denies swallowing or chewing issues. Pt states that at home, she was eating less and did not have the desire to make full meals for herself anymore.   Pt is willing to try Ensure supplements following her procedure. Reviewed protein foods and their importance in the healing process.  Per patient, UBW is in the 140 lb range. Per records, pt has lost 8 lb since 3/21 (7% wt loss x 5.5 months, insignificant for time frame).  Labs reviewed. Medications: IV Vitamin K, D5 -.45% NaCl infusion at 75 ml/hr -provides 306 kcal  NUTRITION - FOCUSED PHYSICAL EXAM:    Most Recent Value  Orbital Region  Mild depletion  Upper Arm Region  Severe depletion  Thoracic and Lumbar Region  Unable to assess  Buccal Region  Moderate depletion  Temple Region  Moderate depletion  Clavicle Bone Region  Moderate depletion  Clavicle and Acromion Bone  Region  Moderate depletion  Scapular Bone Region  Unable to assess  Dorsal Hand  Moderate depletion  Patellar Region  Unable to assess  Anterior Thigh Region  Unable to assess  Posterior Calf Region  Unable to assess  Edema (RD Assessment)  None       Diet Order:   Diet Order            Diet NPO time specified Except for: Sips with Meds  Diet effective midnight              EDUCATION NEEDS:   Education needs have been addressed  Skin:  Skin Assessment: Skin Integrity Issues: Skin Integrity Issues:: Incisions Incisions: right hip  Last BM:  9/10  Height:   Ht Readings from Last 1 Encounters:  04/23/18 5\' 4"  (1.626 m)    Weight:   Wt Readings from Last 1 Encounters:  04/23/18 49.9 kg    Ideal Body Weight:  54.5 kg  BMI:  Body mass index is 18.88 kg/m.  Estimated Nutritional Needs:   Kcal:  1500-1700  Protein:  65-75g  Fluid:  1.7L/day   Clayton Bibles, MS, RD, LDN [facility] Diet[facility] Pager: [phone] After Hours Pager: [phone]

## 2018-04-24 NOTE — Progress Notes (Signed)
Pharmacy Antibiotic Note  Alejandra [name] is a 77 y.o. female admitted on 04/23/2018 with wound dehiscence R THA s/p I&D.  Pharmacy has been consulted for vancomycin and ceftazidime dosing.  Plan:  Ceftazidime 2g IV q12h for CrCl < 50 ml/min  Vancomycin 1g IV q36h for estimated AUC 471  Vancomycin levels at steady state, goal AUC 400-500  Follow up renal function & cultures  Height: 5\' 4"  (162.6 cm) Weight: 115 lb (52.2 kg) IBW/kg (Calculated) : 54.7  Temp (24hrs), Avg:98.6 F (37 C), Min:98.4 F (36.9 C), Max:98.9 F (37.2 C)  Recent Labs  Lab 04/23/18 1249 04/23/18 1742  WBC 33.8* 41.1*  CREATININE  --  0.85    Estimated Creatinine Clearance: 45 mL/min (by C-G formula based on SCr of 0.85 mg/dL).    Not on File  Antimicrobials this admission:  9/11 Cefazolin x 1 9/11 Ceftazidime >> 9/11 Vancomycin >>  Dose adjustments this admission:    Microbiology results:  9/11 MRSA PCR: neg 9/11 UCx: sent 9/11 R hip wound tissue: no organisms seen, cx pending  Thank you for allowing pharmacy to be a part of this patient's care.  Peggyann Juba, PharmD, BCPS Pager: [phone] 04/24/2018 5:34 PM

## 2018-04-24 NOTE — Progress Notes (Addendum)
IP PROGRESS NOTE  Subjective:   Alejandra [name] was readmitted yesterday with an area of dehiscence at the right thigh scar.  She has not noted swelling of the right thigh.  She has not noted bleeding. A CT pelvis yesterday healed a thick-walled enhancing gas and fluid collection at the lateral aspect of the right hip with concern for underlying hardware infection.  Objective: Vital signs in last 24 hours: Blood pressure (!) 142/80, pulse 82, temperature 98.9 F (37.2 C), temperature source Oral, resp. rate 16, height 5\' 4"  (1.626 m), weight 110 lb (49.9 kg), SpO2 100 %.  Intake/Output from previous day: 09/10 0701 - 09/11 0700 In: 1445.7 [P.O.:740; I.V.:705.7] Out: -   Physical Exam:  Lungs: Clear bilaterally Cardiac: Regular rate and rhythm Abdomen: No hepatosplenomegaly Extremities: No leg edema Musculoskeletal: Dressing at the right thigh wound is soaked with old appearing blood.  No bleeding from the opening at the superior aspect of the wound    Lab Results: Recent Labs    04/23/18 1249 04/23/18 1742  WBC 33.8* 41.1*  HGB 11.5* 11.6*  HCT 39.3 39.3  PLT 1,774* 1,911*   PT 16.7, PTT 36 BMET Recent Labs    04/23/18 1742  NA 139  K 4.0  CL 103  CO2 28  GLUCOSE 111*  BUN 15  CREATININE 0.85  CALCIUM 9.1     Studies/Results: Ct Pelvis W Contrast  Result Date: 04/23/2018 CLINICAL DATA:  Right hip pain with wound dehiscence. Right arthroplasty revision on 02/04/2018. EXAM: CT PELVIS WITH CONTRAST TECHNIQUE: Multidetector CT imaging of the pelvis was performed using the standard protocol following the bolus administration of intravenous contrast. CONTRAST:  12mL OMNIPAQUE IOHEXOL 300 MG/ML  SOLN COMPARISON:  Right hip x-rays dated April 12, 2018. FINDINGS: Urinary Tract:  No abnormality visualized. Bowel: Unremarkable visualized pelvic bowel loops. Moderate amount of stool in the visualized colon. Vascular/Lymphatic: Aortoiliac atherosclerotic vascular disease. No  enlarged pelvic lymph nodes. Reproductive:  No mass or other significant abnormality. Other:  None. Musculoskeletal: Prior right total hip arthroplasty revision. Focal cortical destruction along the lateral aspect of the intertrochanteric femur at the level of the proximal femoral stem. Questionable increased lucency along the femoral stem. There is a large, thick-walled rim enhancing gas and fluid collection along the lateral aspect of the right hip, measuring approximately 5.2 x 2.6 x 11.4 cm (AP by transverse by CC). This collection lies adjacent to the intertrochanteric femur. Moderate to severe osteoarthritis of the left hip. Mild bilateral sacroiliac joint osteoarthritis. Degenerative changes of the lumbar spine. Old left superior pubic ramus fracture. No acute fracture or dislocation. IMPRESSION: 1. Prior total hip arthroplasty revision with large rim enhancing gas and fluid collection along the lateral aspect of the right hip, measuring up to 11.4 cm in craniocaudal dimension. The collection extends to and overlies the lateral and posterior aspect of the right intertrochanteric femur, with an underlying area of focal cortical destruction concerning for osteomyelitis and underlying hardware infection. Electronically Signed   By: Titus Dubin M.D.   On: 04/23/2018 16:51    Medications: I have reviewed the patient's current medications.  Assessment/Plan: 1. Polycythemia vera on hydroxyurea. Last phlebotomy 09/08/2015  Hydroxyurea dose increased to 1500 mg daily 06/26/2016  Hydroxyurea changed to 1500 mg daily on Monday through Friday, 1000 mg on Saturday and Sunday beginning 09/11/2016  Hydroxyurea changed to 1500 mg Monday Wednesday and Friday and 1000 mg other days beginning 10/10/2016  Hydroxyurea changed to 1000 mg daily beginning 10/31/2016  Hydroxyurea placed on hold beginning 11/27/2016-she misunderstood our instructions and continued 1000 mg daily.  Hydroxyurea dose decreased to 500  mg daily beginning 01/01/2017  Hydroxyurea dose increased 06/05/2017  Hydroxyurea dose increased to 1000 mg daily except for 500 mg on Monday, Wednesday, and Friday, 11/01/2017 2. Hypertension, followed by Dr. Montez Morita.  3. History of hyperpigmentation at the hands. Question related to hydroxyurea. 4. Hip fracture February 2013. She underwent a bipolar hip arthroplasty on 04/23/2012. 5. Thrombocytosis. Most likely secondary to polycythemia vera and iron deficiency. 6. Hospitalization with left shoulder cellulitis February 2016. 7. Pelvic fracture after a fall 05/31/2015 8. Weight loss-etiology unclear; weight stable 12/16/2015 and06/15/2017 9. Right anterior buccal ulcer 03/09/2016 10. Revision of right hip arthroplasty 09/01/1476, complicated by intraoperative and postoperative bleeding requiring multiple red cell transfusions 11. Swelling at the right hip surgical site week of 04/08/2018- 220 cc bloody fluid aspirated from the right hip surgical site 04/11/2018  Right thigh wound dehiscence prompting readmission 04/23/2018  CT 04/23/2018- gas/fluid collection at the lateral right hip with area of focal cortical destruction concerning for infection 12.  Coagulopathy  Alejandra [name] readmitted with right thigh wound dehiscence, a fluid/gas collection, and potential underlying infection.  She is scheduled for surgery later today.  She had excessive bleeding following the hip revision surgery in June.  She now has fluid collection in the right thigh old blood draining from the area of dehiscence.  I doubt she has active bleeding at present.  There is likely an old hematoma at the right thigh.  Alejandra [name] likely has dysfunctional platelets secondary to the myeloproliferative disorder, potentially acquired von Willebrand's disease, and a mild coagulopathy.  She was scheduled for outpatient follow-up at the cancer center tomorrow.  I planned initiate platelet lowering therapy and obtain a von Willebrand  panel.  These cannot be done prior to surgery today.  The mild coagulopathy is likely secondary to recent anabolic therapy.  Vitamin K was started last night and will continue.  I will discuss the case with Dr. Mayer Camel.  If he feels surgery is needed urgently I will recommend DDAVP prior to surgery.  I discussed the risk associated with DDAVP with Alejandra [name] and she agrees to proceed.  She can be transfused platelets for excessive bleeding following surgery.  I discussed case with my hematology colleagues.  Recommendations: 1.  Continue vitamin K 2.  DDAVP prior to surgery 3.  Platelet lowering therapy as an outpatient 4.  Transfuse platelets for excessive bleeding 5.  Old anticoagulation therapy  Hematology we will continue following her as an inpatient and we will schedule outpatient follow-up.   LOS: 1 day   Betsy Coder, MD   04/24/2018, 6:45 AM

## 2018-04-24 NOTE — H&P (View-Only) (Signed)
[name]  MRN: [id number]  DOB/AGE:  February 23, 1941 / 77 y.o.        PROGRESS NOTE Subjective: Patient is alert, oriented, no Nausea, no Vomiting, yes passing gas, .  Pt. Is NPO at this time. Denies SOB, Chest or Calf Pain. Using Incentive Spirometer, PAS in place. Ambulate WBAT Patient reports pain as  mild .    Objective: Vital signs in last 24 hours: Vitals:   04/23/18 1133 04/23/18 1839 04/23/18 2213 04/24/18 0548  BP:  138/72 130/79 (!) 142/80  Pulse:  79 82 82  Resp:  14 16 16   Temp:  98.4 F (36.9 C) 98.4 F (36.9 C) 98.9 F (37.2 C)  TempSrc:  Oral Oral Oral  SpO2:  100% 100% 100%  Weight: 49.9 kg     Height: 5\' 4"  (1.626 m)         Intake/Output from previous day: I/O last 3 completed shifts: In: 1745.6 [P.O.:740; I.V.:1005.6] Out: -    Intake/Output this shift: No intake/output data recorded.   LABORATORY DATA: Recent Labs    04/23/18 1249 04/23/18 1742 04/24/18 0513  WBC 33.8* 41.1*  --   HGB 11.5* 11.6*  --   HCT 39.3 39.3  --   PLT 1,774* 1,911*  --   NA  --  139  --   K  --  4.0  --   CL  --  103  --   CO2  --  28  --   BUN  --  15  --   CREATININE  --  0.85  --   GLUCOSE  --  111*  --   INR  --  1.38 1.37  CALCIUM  --  9.1  --     Examination: Neurologically intact Neurovascular intact Sensation intact distally Intact pulses distally Dorsiflexion/Plantar flexion intact Incision: Dressing is nearly soaked through with dark blood.} XR AP&Lat of hip shows well placed\fixed THA  Assessment:      ADDITIONAL DIAGNOSIS:  Expected Acute Blood Loss Anemia, polycythemia vera  Plan:   Pt is NPO with surgery planned for this afternoon.  Plan to do right hip incision and drainage with possible exchange of poly and head.  We appreciate Dr. Gearldine Shown input with the pt's history of polycythemia and bleeding issues after last surgery.  Anticipate D/C back to SNF after short stay in hospital as pt was at SNF prior to current  admission.

## 2018-04-24 NOTE — Anesthesia Postprocedure Evaluation (Signed)
Anesthesia Post Note  Patient: Alejandra [name]  Procedure(s) Performed: IRRIGATION AND DEBRIDEMENT EXTREMITY RIGHT HIP WITH POLY EXCHANGE POSTERIOR LATERAL POSITION,stem and head exchange (Right Hip)     Patient location during evaluation: PACU Anesthesia Type: General Level of consciousness: awake Pain management: pain level controlled Vital Signs Assessment: post-procedure vital signs reviewed and stable Respiratory status: spontaneous breathing Cardiovascular status: stable Postop Assessment: no apparent nausea or vomiting Anesthetic complications: no    Last Vitals:  Vitals:   04/24/18 1800 04/24/18 1815  BP: 140/73 137/70  Pulse: 78 81  Resp: 17 16  Temp: 37.1 C 36.7 C  SpO2: 100% 98%    Last Pain:  Vitals:   04/24/18 1354  TempSrc: Oral  PainSc: 0-No pain                 Maily Debarge

## 2018-04-24 NOTE — NC FL2 (Signed)
[place] MEDICAID FL2 LEVEL OF CARE SCREENING TOOL     IDENTIFICATION  Patient Name: Alejandra [name] Birthdate: 14-Sep-1940 Sex: female Admission Date (Current Location): 04/23/2018  [facility] and [place] Number:  Herbalist and Address:  [facility],  [place] [address], [place]      Provider Number: [id number]  Attending Physician Name and Address:  Frederik Pear, MD  Relative Name and Phone Number:       Current Level of Care: Hospital Recommended Level of Care: [place] Prior Approval Number:    Date Approved/Denied:   PASRR Number: [id number]  Discharge Plan: SNF    Current Diagnoses: Patient Active Problem List   Diagnosis Date Noted  . History of total hip arthroplasty, right 04/23/2018  . Hematoma of right hip 04/15/2018  . Right hip pain 04/13/2018  . Fall at home, initial encounter 04/13/2018  . AKI (acute kidney injury) ([place]) 04/13/2018  . Elevated troponin 04/13/2018  . Possible Acute lower UTI 04/13/2018  . Sepsis ([place]) 04/12/2018  . UTI (urinary tract infection) 04/12/2018  . Primary osteoarthritis of right hip 02/04/2018  . 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 Pre-operative cardiovascular examination 01/31/2018  . Abnormal EKG 01/31/2018  . Vertigo 09/28/2014  . Essential hypertension 09/28/2014  . Thrombocytosis ([place]) 04/23/2012  . Polycythemia vera ([place]) 12/22/2011    Orientation RESPIRATION BLADDER Height & Weight     Time, Place, Situation, Self  Normal Incontinent Weight: 110 lb (49.9 kg) Height:  5\' 4"  (162.6 cm)  BEHAVIORAL SYMPTOMS/MOOD NEUROLOGICAL BOWEL NUTRITION STATUS      Continent Diet(See d/c summary )  AMBULATORY STATUS COMMUNICATION OF NEEDS Skin   Extensive Assist Verbally (Pressure Injury Sacrum, Right Hip Surgical Incision)                       Personal Care Assistance Level of Assistance  Bathing, Dressing Bathing Assistance: Limited assistance   Dressing  Assistance: Limited assistance     Functional Limitations Info  Sight, Hearing, Speech Sight Info: Adequate Hearing Info: Adequate Speech Info: Adequate    SPECIAL CARE FACTORS FREQUENCY  PT (By licensed PT), OT (By licensed OT)     PT Frequency: 5X/WEEK  OT Frequency: 5X/WEEK             Contractures Contractures Info: Not present    Additional Factors Info  Code Status, Allergies, Psychotropic Code Status Info: FULLCODE  Allergies Info: NKA           Current Medications (04/24/2018):  This is the current hospital active medication list Current Facility-Administered Medications  Medication Dose Route Frequency Provider Last Rate Last Dose  . desmopressin (DDAVP) 14.8 mcg in sodium chloride 0.9 % 50 mL IVPB  0.3 mcg/kg Intravenous On Call to OR Ladell Pier, MD      . dextrose 5 %-0.45 % sodium chloride infusion   Intravenous Continuous Leighton Parody, PA-C 75 mL/hr at 04/24/18 0650    . oxyCODONE-acetaminophen (PERCOCET/ROXICET) 5-325 MG per tablet 1 tablet  1 tablet Oral Q4H PRN Leighton Parody, PA-C   1 tablet at 04/23/18 1408  . phytonadione (VITAMIN K) SQ injection 10 mg  10 mg Subcutaneous Q12H Ladell Pier, MD   10 mg at 04/24/18 0841  . povidone-iodine 10 % swab 2 application  2 application Topical Once Frederik Pear, MD      . tranexamic acid (CYKLOKAPRON) 1,000 mg in sodium chloride 0.9 % 100  mL IVPB  1,000 mg Intravenous To OR Frederik Pear, MD      . tranexamic acid (CYKLOKAPRON) 2,000 mg in sodium chloride 0.9 % 50 mL Topical Application  4,580 mg Topical To OR Frederik Pear, MD         Discharge Medications: Please see discharge summary for a list of discharge medications.  Relevant Imaging Results:  Relevant Lab Results:   Additional Information [id number]  Lia Hopping, LCSW

## 2018-04-24 NOTE — Discharge Instructions (Signed)

## 2018-04-24 NOTE — Anesthesia Preprocedure Evaluation (Signed)
Anesthesia Evaluation  Patient identified by MRN, date of birth, ID band Patient awake    Reviewed: Allergy & Precautions, NPO status   History of Anesthesia Complications (+) PSEUDOCHOLINESTERASE DEFICIENCY  Airway Mallampati: II  TM Distance: >3 FB     Dental   Pulmonary former smoker,    breath sounds clear to auscultation       Cardiovascular hypertension,  Rhythm:Regular Rate:Normal     Neuro/Psych    GI/Hepatic negative GI ROS, Neg liver ROS,   Endo/Other    Renal/GU Renal disease     Musculoskeletal  (+) Arthritis ,   Abdominal   Peds  Hematology   Anesthesia Other Findings   Reproductive/Obstetrics                             Anesthesia Physical Anesthesia Plan  ASA: III  Anesthesia Plan: General   Post-op Pain Management:    Induction: Intravenous  PONV Risk Score and Plan: Treatment may vary due to age or medical condition, Ondansetron, Dexamethasone and Midazolam  Airway Management Planned:   Additional Equipment:   Intra-op Plan:   Post-operative Plan: Possible Post-op intubation/ventilation  Informed Consent: I have reviewed the patients History and Physical, chart, labs and discussed the procedure including the risks, benefits and alternatives for the proposed anesthesia with the patient or authorized representative who has indicated his/her understanding and acceptance.   Dental advisory given  Plan Discussed with: CRNA and Anesthesiologist  Anesthesia Plan Comments:         Anesthesia Quick Evaluation

## 2018-04-24 NOTE — Interval H&P Note (Signed)
History and Physical Interval Note:  04/24/2018 2:08 PM  Alejandra [name]  has presented today for surgery, with the diagnosis of RIGHT HIP WOUND DEHISENCE  The various methods of treatment have been discussed with the patient and family. After consideration of risks, benefits and other options for treatment, the patient has consented to  Procedure(s): IRRIGATION AND DEBRIDEMENT EXTREMITY RIGHT HIP WITH POLY EXCHANGE POSTERIOR LATERAL POSITION (Right) as a surgical intervention .  The patient's history has been reviewed, patient examined, no change in status, stable for surgery.  I have reviewed the patient's chart and labs.  Questions were answered to the patient's satisfaction.     Kerin Salen

## 2018-04-24 NOTE — Care Management Note (Signed)
Case Management Note  Patient Details  Name: SHERLENE RICKEL MRN: [id number] Date of Birth: 1941/02/26  Subjective/Objective: Noted from SNF-CSW already following for return. No further CM needs.                   Action/Plan:d/c SNF.   Expected Discharge Date:  (UNKNOWN)               Expected Discharge Plan:  Skilled Nursing Facility  In-House Referral:  Clinical Social Work  Discharge planning Services  CM Consult  Post Acute Care Choice:    Choice offered to:     DME Arranged:    DME Agency:     HH Arranged:    [place] Agency:     Status of Service:  Completed, signed off  If discussed at [employer], dates discussed:    Additional Comments:  Dessa Phi, RN 04/24/2018, 10:46 AM

## 2018-04-24 NOTE — Progress Notes (Signed)
CSW consult- SNF  Patient admitted with dehiscence of the proximal aspect of her right revision hip wound. Patient admitted from [place] at [facility] where she is receiving physical therapy. Patient discharge from [facility] on 9/3. A full CSW assessment was completed on 9/2.  Patient reports the plan is for her to return to [facility] at discharge.  CSW explain to the patient she will need updated PT notes and insurance approval before she returns. Patient reports understanding.  CSW will continue to follow for discharge needs.    Kathrin Greathouse, Marlinda Mike, MSW Clinical Social Worker  [phone] 04/24/2018  10:09 AM

## 2018-04-24 NOTE — Anesthesia Procedure Notes (Signed)
Procedure Name: Intubation Date/Time: 04/24/2018 2:29 PM Performed by: Sharlette Dense, CRNA Patient Re-evaluated:Patient Re-evaluated prior to induction Oxygen Delivery Method: Circle system utilized Preoxygenation: Pre-oxygenation with 100% oxygen Induction Type: IV induction Ventilation: Mask ventilation without difficulty and Oral airway inserted - appropriate to patient size Laryngoscope Size: Sabra Heck and 2 Grade View: Grade I Tube type: Oral Tube size: 7.0 mm Number of attempts: 1 Airway Equipment and Method: Stylet Placement Confirmation: ETT inserted through vocal cords under direct vision,  positive ETCO2 and breath sounds checked- equal and bilateral Secured at: 22 cm Tube secured with: Tape Dental Injury: Teeth and Oropharynx as per pre-operative assessment

## 2018-04-24 NOTE — Progress Notes (Signed)
[name]  MRN: [id number]  DOB/AGE:  08-20-1940 / 77 y.o.        PROGRESS NOTE Subjective: Patient is alert, oriented, no Nausea, no Vomiting, yes passing gas, .  Pt. Is NPO at this time. Denies SOB, Chest or Calf Pain. Using Incentive Spirometer, PAS in place. Ambulate WBAT Patient reports pain as  mild .    Objective: Vital signs in last 24 hours: Vitals:   04/23/18 1133 04/23/18 1839 04/23/18 2213 04/24/18 0548  BP:  138/72 130/79 (!) 142/80  Pulse:  79 82 82  Resp:  14 16 16   Temp:  98.4 F (36.9 C) 98.4 F (36.9 C) 98.9 F (37.2 C)  TempSrc:  Oral Oral Oral  SpO2:  100% 100% 100%  Weight: 49.9 kg     Height: 5\' 4"  (1.626 m)         Intake/Output from previous day: I/O last 3 completed shifts: In: 1745.6 [P.O.:740; I.V.:1005.6] Out: -    Intake/Output this shift: No intake/output data recorded.   LABORATORY DATA: Recent Labs    04/23/18 1249 04/23/18 1742 04/24/18 0513  WBC 33.8* 41.1*  --   HGB 11.5* 11.6*  --   HCT 39.3 39.3  --   PLT 1,774* 1,911*  --   NA  --  139  --   K  --  4.0  --   CL  --  103  --   CO2  --  28  --   BUN  --  15  --   CREATININE  --  0.85  --   GLUCOSE  --  111*  --   INR  --  1.38 1.37  CALCIUM  --  9.1  --     Examination: Neurologically intact Neurovascular intact Sensation intact distally Intact pulses distally Dorsiflexion/Plantar flexion intact Incision: Dressing is nearly soaked through with dark blood.} XR AP&Lat of hip shows well placed\fixed THA  Assessment:      ADDITIONAL DIAGNOSIS:  Expected Acute Blood Loss Anemia, polycythemia vera  Plan:   Pt is NPO with surgery planned for this afternoon.  Plan to do right hip incision and drainage with possible exchange of poly and head.  We appreciate Dr. Gearldine Shown input with the pt's history of polycythemia and bleeding issues after last surgery.  Anticipate D/C back to SNF after short stay in hospital as pt was at SNF prior to current  admission.

## 2018-04-24 NOTE — Plan of Care (Signed)
Patient just returned from surgery, she is drowsy, but arousable

## 2018-04-24 NOTE — Op Note (Addendum)
Pre Op Dx: Wound dehiscence right total hip arthroplasty inpatient with polycythemia vera, poor clotting capabilities, possible deep wound infection.  Revision was done 02/04/2018.  Post Op Dx: Same   Procedure: Radical irrigation and debridement of right total hip arthroplasty.  Wound appeared to be inflamed but there is no purulence we did revise the total hip stem femoral head and polyethylene acetabular component with the same size components were used on the index procedure.  All components were DePuy S-ROM.  Surgeon: Kerin Salen, MD   Assistant: Kerry Hough. Barton Dubois (present throughout entire procedure and necessary for timely completion of the procedure)   Anesthesia: General   EBL: 700 cc Fluids: 1800 cc crystalloid  Specimens: Deep tissue that came back with no white cells and no organisms.   Indications: S/p right revision hip replacement on 02/04/2018 for a cemented femoral stem that is been placed for 5 years ago by another physician and it come loose over time.  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 Barbaraann Boys who did consult with [place] prior to both her revision in June and the procedure that we did today.  After a revision in June the patient used blood for 4 to 5 days despite use of trans-Amick acid, holding aspirin, and a platelet count of fluctuated between 1.5 and 2 million.  Apparently her platelets are not "sticky".  The initial oozing dried up and when she was seen back in the office at the 2 and 4-week intervals her wound looked good she then came back around the 6-week mark and had a hematoma that was drained 220 cc Gram stain and culture came back negative.  She then went on to develop a urinary tract infection and was admitted to the hospital on 04/12/2018 and treated with antibiotics the culture of the urine grew E. coli.  About a week later her wound dehisced at the nursing home we saw her in the office  it it opened up 1 cm proximally and there was dark bloody material coming out similar to what we have aspirated a few weeks prior.  She is admitted to the hospital for preoperative work-up by hematology in preparation for irrigation and debridement today.  The plan preoperatively was to give her DDAVP trans-Amick acid and if she had significant bleeding during surgery single donor platelets.  Procedure: Patient identified by arm band and taken to operating room at [facility] appropriate, anesthetic monitors were attached and general endotracheal anesthesia induced with the patient in the supine position. Foley catheter was inserted he was rolled into the lateral decubitus position and right lower extremity prepped and draped in usual sterile fashion from the ankle to the hemipelvis. Time out procedure performed and we began the operation by recreating the old total hip incision lateral to posterior lateral. The skin and subcutaneous tissue were incised and dissected down to the iliotibial band.  We encountered the hematoma cavity on the way down to the IT band and the walls of the hematoma cavity resected sharply with a 10 blade.  This exposed the hip joint itself from a posterior lateral position. The hip was then flexed and internally rotated dislocating the femoral head which was removed with a mallet and a metal cylinder.  We remove scar tissue from around the stem and was found to be well placed and well fixed. We then removed to the S-ROM stem using the large flat screwdriver  and a mallet.is the trunnion was then tucked superior and anterior to the acetabular shell. Scar tissue was removed from around the shell allowing [place] to remove the polyethylene liner the shell was also well fixed.  After the sharp dissection the wound was then irrigated with pulse lavage alternating with hydrogen peroxide and Clorpactin solution that was left in the wound it 1 minute intervals to help strip biofilm off the  metal. Satisfied with the debridement, and irrigation we then exchanged gloves and hammered into place a new polyethylene liner index 10 posterior superior, a new [id number] SROM stem, and a new +36 mm metal head.  The stem was placed in the same version as the sleeve which was the original sleeve.  The wound was again irrigated out with pulse lavage.  Double-armed a deep Hemovac was placed below the IT band which was then closed with running #1 Vicryl suture.  In the subcutaneous tissue we placed another double-armed Hemovac one in the deep subcu one in the superficial subcu which was closed with 2 layers of 2-0 Vicryl suture and the skin with 3-0 running Vicryl suture followed by dressing of 4 x 4's and tape.  At this point the patient was unclamped rolled supine awaken extubated and taken to the recovery room without difficulty.  Although the Gram stain and culture came back negative for white cells or bacteria the wound did appear to be quite inflamed and based on the clinical appearance we will consult infectious diseases for IV antibiotics for 6 weeks.  Regarding the drains a deep drains will be kept in for 3 days and then removed as long as the drainage is minimal.  The superficial drains will be removed after the deep drains have been removed.  Patient will be weightbearing as tolerated.

## 2018-04-24 NOTE — Transfer of Care (Signed)
Immediate Anesthesia Transfer of Care Note  Patient: Alejandra [name]  Procedure(s) Performed: IRRIGATION AND DEBRIDEMENT EXTREMITY RIGHT HIP WITH POLY EXCHANGE POSTERIOR LATERAL POSITION,stem and head exchange (Right Hip)  Patient Location: PACU  Anesthesia Type:General  Level of Consciousness: awake, alert , oriented and patient cooperative  Airway & Oxygen Therapy: Patient Spontanous Breathing and Patient connected to face mask oxygen  Post-op Assessment: Report given to RN and Post -op Vital signs reviewed and stable  Post vital signs: Reviewed and stable  Last Vitals:  Vitals Value Taken Time  BP 106/88 04/24/2018  5:08 PM  Temp    Pulse 92 04/24/2018  5:14 PM  Resp 20 04/24/2018  5:15 PM  SpO2 100 % 04/24/2018  5:14 PM  Vitals shown include unvalidated device data.  Last Pain:  Vitals:   04/24/18 1354  TempSrc: Oral  PainSc: 0-No pain      Patients Stated Pain Goal: 4 (29/92/42 6834)  Complications: No apparent anesthesia complications

## 2018-04-25 ENCOUNTER — Encounter (HOSPITAL_COMMUNITY): Payer: Self-pay | Admitting: Orthopedic Surgery

## 2018-04-25 ENCOUNTER — Inpatient Hospital Stay: Payer: Medicare HMO | Admitting: Nurse Practitioner

## 2018-04-25 ENCOUNTER — Inpatient Hospital Stay: Payer: Medicare HMO | Attending: Nurse Practitioner

## 2018-04-25 DIAGNOSIS — Z87891 Personal history of nicotine dependence: Secondary | ICD-10-CM

## 2018-04-25 DIAGNOSIS — T8131XA Disruption of external operation (surgical) wound, not elsewhere classified, initial encounter: Secondary | ICD-10-CM | POA: Diagnosis present

## 2018-04-25 DIAGNOSIS — Z96641 Presence of right artificial hip joint: Secondary | ICD-10-CM

## 2018-04-25 DIAGNOSIS — Z87440 Personal history of urinary (tract) infections: Secondary | ICD-10-CM

## 2018-04-25 DIAGNOSIS — T8130XD Disruption of wound, unspecified, subsequent encounter: Secondary | ICD-10-CM

## 2018-04-25 DIAGNOSIS — T8451XD Infection and inflammatory reaction due to internal right hip prosthesis, subsequent encounter: Secondary | ICD-10-CM

## 2018-04-25 LAB — BASIC METABOLIC PANEL
Anion gap: 9 (ref 5–15)
BUN: 16 mg/dL (ref 8–23)
CO2: 23 mmol/L (ref 22–32)
Calcium: 8.4 mg/dL — ABNORMAL LOW (ref 8.9–10.3)
Chloride: 106 mmol/L (ref 98–111)
Creatinine, Ser: 1.02 mg/dL — ABNORMAL HIGH (ref 0.44–1.00)
GFR calc Af Amer: 59 mL/min — ABNORMAL LOW (ref >60)
GFR, EST NON AFRICAN AMERICAN: 51 mL/min — AB (ref >60)
GLUCOSE: 173 mg/dL — AB (ref 70–99)
POTASSIUM: 5 mmol/L (ref 3.5–5.1)
Sodium: 138 mmol/L (ref 135–145)

## 2018-04-25 LAB — URINE CULTURE: CULTURE: NO GROWTH

## 2018-04-25 LAB — CBC
HEMATOCRIT: 27 % — AB (ref 36.0–46.0)
Hemoglobin: 8.2 g/dL — ABNORMAL LOW (ref 12.0–15.0)
MCH: 24.1 pg — AB (ref 26.0–34.0)
MCHC: 30.4 g/dL (ref 30.0–36.0)
MCV: 79.4 fL (ref 78.0–100.0)
PLATELETS: 1658 10*3/uL — AB (ref 150–400)
RBC: 3.4 MIL/uL — ABNORMAL LOW (ref 3.87–5.11)
RDW: 23.4 % — ABNORMAL HIGH (ref 11.5–15.5)
WBC: 50.5 10*3/uL (ref 4.0–10.5)

## 2018-04-25 MED ORDER — ANAGRELIDE HCL 0.5 MG PO CAPS
1.0000 mg | ORAL_CAPSULE | Freq: Two times a day (BID) | ORAL | Status: DC
  Administered 2018-04-25 – 2018-05-06 (×23): 1 mg via ORAL
  Filled 2018-04-25 (×23): qty 2

## 2018-04-25 MED ORDER — ENSURE ENLIVE PO LIQD
237.0000 mL | Freq: Two times a day (BID) | ORAL | Status: DC
  Administered 2018-04-25 – 2018-05-06 (×17): 237 mL via ORAL

## 2018-04-25 NOTE — Progress Notes (Signed)
[name]  MRN: [id number]  DOB/AGE:  1940/12/22 / 77 y.o.  1 Day Post-Op Procedure(s) (LRB): IRRIGATION AND DEBRIDEMENT EXTREMITY RIGHT HIP WITH POLY EXCHANGE POSTERIOR LATERAL POSITION,stem and head exchange (Right)    PROGRESS NOTE Subjective: Patient is alert, oriented, no Nausea, no Vomiting, yes passing gas, . Taking PO well. Denies SOB, Chest or Calf Pain. Using Incentive Spirometer, PAS in place. Ambulate WBAT Patient reports pain as  4/10  .    Objective: Vital signs in last 24 hours: Vitals:   04/24/18 2223 04/24/18 2241 04/25/18 0500 04/25/18 0909  BP: (Abnormal) 97/59 97/61 (Abnormal) 95/59 107/60  Pulse: 85 80 75 79  Resp: 18 18 17 14   Temp: 98.4 F (36.9 C) 98.1 F (36.7 C) 98.7 F (37.1 C) 99 F (37.2 C)  TempSrc: Oral Oral Oral Oral  SpO2: 98% 100% 99% 100%  Weight:      Height:          Intake/Output from previous day: I/O last 3 completed shifts: In: 6581.9 [P.O.:680; I.V.:5151.8; IV Piggyback:750.1] Out: 0923 [Urine:1050; Drains:180; Blood:500]   Intake/Output this shift: Total I/O In: 0  Out: 125 [Drains:125]   LABORATORY DATA: Recent Labs    04/23/18 1742 04/24/18 0513 04/24/18 1841 04/25/18 0450  WBC 41.1*  --   --  50.5*  HGB 11.6*  --  8.0* 8.2*  HCT 39.3  --  27.7* 27.0*  PLT 1,911*  --   --  1,658*  NA 139  --   --  138  K 4.0  --   --  5.0  CL 103  --   --  106  CO2 28  --   --  23  BUN 15  --   --  16  CREATININE 0.85  --   --  1.02*  GLUCOSE 111*  --   --  173*  INR 1.38 1.37  --   --   CALCIUM 9.1  --   --  8.4*    Examination: Neurologically intact ABD soft Neurovascular intact Sensation intact distally Intact pulses distally Dorsiflexion/Plantar flexion intact Incision: dressing C/D/I No cellulitis present Compartment soft} XR AP&Lat of hip shows well placed\fixed THA  Assessment:   1 Day Post-Op Procedure(s) (LRB): IRRIGATION AND DEBRIDEMENT EXTREMITY RIGHT HIP WITH POLY EXCHANGE POSTERIOR  LATERAL POSITION,stem and head exchange (Right) ADDITIONAL DIAGNOSIS:  Expected Acute Blood Loss Anemia, Polycythemia vera, hx of AKI, Hx UTI   Status post-revision right total up arthroplasty with postoperative hematoma and UTI.  Irrigation debridement yesterday revealed a lot of blood and inflamed tissue.  Unfortunately, Gram stain and culture thus far is negative and the Gram stain and culture from 2 weeks ago, also negative.  Plan: PT/OT WBAT, THA  DVT Prophylaxis: SCDx72 hrs, ASA 81 mg BID x 2 weeks  DISCHARGE PLAN: Skilled Nursing Facility/Rehab  DISCHARGE NEEDS: HHPT, Walker, 3-in-1 comode seat and IV Antibiotics  Antibiotics per infectious diseases.  Anticipate 6 weeks IV and then 6 weeks oral.  Drains will remain in place at least for another 48 hours, although there was little if any drainage noted this morning.

## 2018-04-25 NOTE — Evaluation (Signed)
Physical Therapy Evaluation Patient Details Name: Alejandra [name] MRN: [id number] DOB: [birth year] Today's Date: 04/25/2018   History of Present Illness  77 yo female adm with  right hip wound dehisence, hematoma-->s/p  IRRIGATION AND DEBRIDEMENT EXTREMITY RIGHT HIP WITH POLY EXCHANGE; PMH: Polycythemia vera, HTN, multiple R hip surgeries, most recent revision in  June of 2019  Clinical Impression  Pt is s/p THA resulting in the deficits listed below (see PT Problem List). Pt presents with some mild confusion and decr ability to follow basic functional commands, requiring +2 assist for safety; pt also with multiple recent admissions, recent falls at home; given these factors pt will  benefit from SNF to maximize independence prior to return home;   Pt will benefit from skilled PT to increase their independence and safety with mobility to allow discharge to the venue listed below.      Follow Up Recommendations SNF;Supervision for mobility/OOB    Equipment Recommendations  None recommended by PT    Recommendations for Other Services       Precautions / Restrictions Precautions Precautions: Fall;Posterior Hip Precaution Comments: verbalizes 2 of 3, requires cues for THP during mobility; 2 hemovacs with each with double port Restrictions Weight Bearing Restrictions: No Other Position/Activity Restrictions: WBAT      Mobility  Bed Mobility Overal bed mobility: Needs Assistance Bed Mobility: Supine to Sit     Supine to sit: Mod assist;+2 for physical assistance;+2 for safety/equipment     General bed mobility comments: assist with RLE, heavy assist to elevate trunk, posterior bias  Transfers Overall transfer level: Needs assistance Equipment used: Rolling walker (2 wheeled) Transfers: Sit to/from [facility] Sit to Stand: Min assist;+2 safety/equipment;Mod assist         General transfer comment: assist for power up and steadying. Verbal cuing for hand  placement and THP; very shaky with stand pivot  Ambulation/Gait Ambulation/Gait assistance: Min assist;+2 safety/equipment;+2 physical assistance Gait Distance (Feet): 4 Feet Assistive device: Rolling walker (2 wheeled)   Gait velocity: decr   General Gait Details: Min assist for steadying, maintaining precautions, step by cues for sequencing RW/LEs  Stairs            Wheelchair Mobility    Modified Rankin (Stroke Patients Only)       Balance Overall balance assessment: History of Falls(reports at least 1 recent fall at home) Sitting-balance support: Single extremity supported Sitting balance-Leahy Scale: Poor Sitting balance - Comments: pt unable to maintain static stand without UE support; initial posterior bias requiring assist to achieve and maintain midline   Standing balance support: Bilateral upper extremity supported Standing balance-Leahy Scale: Poor Standing balance comment: relies on RW for steadying and support                              Pertinent Vitals/Pain Pain Assessment: Faces Faces Pain Scale: Hurts little more Pain Location: R hip  Pain Descriptors / Indicators: Sore;Aching Pain Intervention(s): Limited activity within patient's tolerance;Monitored during session;Repositioned;Ice applied    Home Living Family/patient expects to be discharged to:: Skilled nursing facility                      Prior Function           Comments: pt reports independence with amb (question acuracy of this)     Hand Dominance        Extremity/Trunk Assessment   Upper Extremity Assessment  Upper Extremity Assessment: Generalized weakness    Lower Extremity Assessment Lower Extremity Assessment: Generalized weakness;RLE deficits/detail RLE Deficits / Details: ankle WFL; knee and hip at least 2+/5 RLE: Unable to fully assess due to pain       Communication      Cognition Arousal/Alertness: Awake/alert Behavior During Therapy:  WFL for tasks assessed/performed Overall Cognitive Status: Impaired/Different from baseline Area of Impairment: Safety/judgement;Following commands;Attention;Problem solving                   Current Attention Level: Sustained Memory: Decreased recall of precautions;Decreased short-term memory Following Commands: Follows one step commands with increased time;Follows multi-step commands inconsistently Safety/Judgement: Decreased awareness of safety   Problem Solving: Difficulty sequencing;Requires verbal cues;Requires tactile cues General Comments: pt mildly confused, difficulty answering questions regarding mobility at home, tangential at times; requires step by step cues for sequencing basic tasks      General Comments      Exercises Total Joint Exercises Ankle Circles/Pumps: AROM;Both;10 reps   Assessment/Plan    PT Assessment Patient needs continued PT services  PT Problem List Decreased strength;Pain;Decreased cognition;Decreased activity tolerance;Decreased knowledge of use of DME;Decreased balance;Decreased safety awareness;Decreased mobility;Decreased knowledge of precautions       PT Treatment Interventions DME instruction;Gait training;Stair training;Functional mobility training;Therapeutic activities;Therapeutic exercise;Patient/family education;Balance training    PT Goals (Current goals can be found in the Care Plan section)       Frequency 7X/week   Barriers to discharge        Co-evaluation               AM-PAC PT "6 Clicks" Daily Activity  Outcome Measure Difficulty turning over in bed (including adjusting bedclothes, sheets and blankets)?: Unable Difficulty moving from lying on back to sitting on the side of the bed? : Unable Difficulty sitting down on and standing up from a chair with arms (e.g., wheelchair, bedside commode, etc,.)?: Unable Help needed moving to and from a bed to chair (including a wheelchair)?: A Lot Help needed walking in  hospital room?: A Lot Help needed climbing 3-5 steps with a railing? : A Lot 6 Click Score: 9    End of Session Equipment Utilized During Treatment: Gait belt Activity Tolerance: Patient limited by fatigue Patient left: in chair;with chair alarm set;with call bell/phone within reach Nurse Communication: Mobility status PT Visit Diagnosis: Other abnormalities of gait and mobility (R26.89);History of falling (Z91.81)    Time: 5681-2751 PT Time Calculation (min) (ACUTE ONLY): 20 min   Charges:   PT Evaluation $PT Eval Moderate Complexity: 1 Mod          Kenyon Ana, PT Pager: [phone] 04/25/2018   [facility] [phone]   [facility] 04/25/2018, 11:16 AM

## 2018-04-25 NOTE — Progress Notes (Addendum)
IP PROGRESS NOTE  Subjective:   Alejandra [name] underwent irrigation and debridement of the right hip arthroplasty with revision of the hip hardware on 04/24/2018.  She has no specific complaint.  She was transfused with 1 unit of packed red blood cells last night.   Objective: Vital signs in last 24 hours: Blood pressure (!) 95/59, pulse 75, temperature 98.7 F (37.1 C), temperature source Oral, resp. rate 17, height 5\' 4"  (1.626 m), weight 115 lb (52.2 kg), SpO2 99 %.  Intake/Output from previous day: 09/11 0701 - 09/12 0700 In: 5422 [P.O.:420; I.V.:4251.9; IV Piggyback:750.1] Out: 1730 [Urine:1050; Drains:180; Blood:500]  Physical Exam:   Extremities: No leg edema Musculoskeletal: Dressing at the right thigh wound with a small amount of blood coating the central part of the gauze, drains with a small amount of bloody fluid     Lab Results: Recent Labs    04/23/18 1742 04/24/18 1841 04/25/18 0450  WBC 41.1*  --  50.5*  HGB 11.6* 8.0* 8.2*  HCT 39.3 27.7* 27.0*  PLT 1,911*  --  1,658*   PT 16.7, PTT 36 BMET Recent Labs    04/23/18 1742 04/25/18 0450  NA 139 138  K 4.0 5.0  CL 103 106  CO2 28 23  GLUCOSE 111* 173*  BUN 15 16  CREATININE 0.85 1.02*  CALCIUM 9.1 8.4*     Studies/Results: Ct Pelvis W Contrast  Result Date: 04/23/2018 CLINICAL DATA:  Right hip pain with wound dehiscence. Right arthroplasty revision on 02/04/2018. EXAM: CT PELVIS WITH CONTRAST TECHNIQUE: Multidetector CT imaging of the pelvis was performed using the standard protocol following the bolus administration of intravenous contrast. CONTRAST:  147mL OMNIPAQUE IOHEXOL 300 MG/ML  SOLN COMPARISON:  Right hip x-rays dated April 12, 2018. FINDINGS: Urinary Tract:  No abnormality visualized. Bowel: Unremarkable visualized pelvic bowel loops. Moderate amount of stool in the visualized colon. Vascular/Lymphatic: Aortoiliac atherosclerotic vascular disease. No enlarged pelvic lymph nodes. Reproductive:   No mass or other significant abnormality. Other:  None. Musculoskeletal: Prior right total hip arthroplasty revision. Focal cortical destruction along the lateral aspect of the intertrochanteric femur at the level of the proximal femoral stem. Questionable increased lucency along the femoral stem. There is a large, thick-walled rim enhancing gas and fluid collection along the lateral aspect of the right hip, measuring approximately 5.2 x 2.6 x 11.4 cm (AP by transverse by CC). This collection lies adjacent to the intertrochanteric femur. Moderate to severe osteoarthritis of the left hip. Mild bilateral sacroiliac joint osteoarthritis. Degenerative changes of the lumbar spine. Old left superior pubic ramus fracture. No acute fracture or dislocation. IMPRESSION: 1. Prior total hip arthroplasty revision with large rim enhancing gas and fluid collection along the lateral aspect of the right hip, measuring up to 11.4 cm in craniocaudal dimension. The collection extends to and overlies the lateral and posterior aspect of the right intertrochanteric femur, with an underlying area of focal cortical destruction concerning for osteomyelitis and underlying hardware infection. Electronically Signed   By: Titus Dubin M.D.   On: 04/23/2018 16:51    Medications: I have reviewed the patient's current medications.  Assessment/Plan: 1. Polycythemia vera on hydroxyurea. Last phlebotomy 09/08/2015  Hydroxyurea dose increased to 1500 mg daily 06/26/2016  Hydroxyurea changed to 1500 mg daily on Monday through Friday, 1000 mg on Saturday and Sunday beginning 09/11/2016  Hydroxyurea changed to 1500 mg Monday Wednesday and Friday and 1000 mg other days beginning 10/10/2016  Hydroxyurea changed to 1000 mg daily beginning 10/31/2016  Hydroxyurea placed on hold beginning 11/27/2016-she misunderstood our instructions and continued 1000 mg daily.  Hydroxyurea dose decreased to 500 mg daily beginning  01/01/2017  Hydroxyurea dose increased 06/05/2017  Hydroxyurea dose increased to 1000 mg daily except for 500 mg on Monday, Wednesday, and Friday, 11/01/2017 2. Hypertension, followed by Dr. Montez Morita.  3. History of hyperpigmentation at the hands. Question related to hydroxyurea. 4. Hip fracture February 2013. She underwent a bipolar hip arthroplasty on 04/23/2012. 5. Thrombocytosis. Most likely secondary to polycythemia vera and iron deficiency. 6. Hospitalization with left shoulder cellulitis February 2016. 7. Pelvic fracture after a fall 05/31/2015 8. Weight loss-etiology unclear; weight stable 12/16/2015 and06/15/2017 9. Right anterior buccal ulcer 03/09/2016 10. Revision of right hip arthroplasty 0/02/6225, complicated by intraoperative and postoperative bleeding requiring multiple red cell transfusions 11. Swelling at the right hip surgical site week of 04/08/2018- 220 cc bloody fluid aspirated from the right hip surgical site 04/11/2018  Right thigh wound dehiscence prompting readmission 04/23/2018  CT 04/23/2018- gas/fluid collection at the lateral right hip with area of focal cortical destruction concerning for infection  Irrigation/debridement and replacement of right hip hardware 04/24/2018 12.  Coagulopathy 13.  Anemia secondary to surgical blood loss  Alejandra [name] appears stable.  She appears to have tolerated the surgery well.  I discussed the case with Dr. Mayer Camel.  He reports there was less bleeding with this surgery compared to the procedure in June.  We can administer another dose of DDAVP or transfuse apheresis platelets if she has increased bleeding.  The platelet count remains markedly elevated.  I will begin anagrelide in an attempt to lower the platelet count.  I reviewed the risk associated with anagrelide.  She agrees to proceed.    Recommendations: 1.  Begin anagrelide 2.  CBC and PT/PTT in a.m. 04/26/2018 3.  Stop hydroxyurea     LOS: 2 days   Betsy Coder,  MD   04/25/2018, 7:33 AM

## 2018-04-25 NOTE — Consult Note (Signed)
   [facility] CM Inpatient Consult   04/25/2018  Alejandra [name] 1940-10-24 [id number]    Patient screened for potential [facility] Care Management services due to unplanned readmission risk score of 22% (high). Chart reviewed. Noted discharge plan is to return to SNF. No identifiable [facility] Care Management needs at this time.    Marthenia Rolling, MSN-Ed, RN,BSN [facility] Liaison [phone]

## 2018-04-25 NOTE — Consult Note (Signed)
[place] [facility] for Infectious Disease    Date of Admission:  04/23/2018           Day 1 vancomycin        Day 1 ceftazidime       Reason for Consult: Wound dehiscence and possible right prosthetic hip infection    Referring Provider: Dr. Frederik Pear  Assessment: Her CT scan and the operative report are both suggestive of early prosthetic hip infection even though no white cells or organisms were seen on operative Gram stain.  Cultures may turn out to be falsely negative because of recent treatment with antibiotics for a possible symptomatic UTI.  I agree with the plan for 6 weeks of IV antibiotics.  I will continue vancomycin and ceftazidime pending final cultures.  I will order PICC placement.  Alejandra [name] is in agreement with this plan.  Plan: 1. Continue vancomycin and ceftazidime pending final culture results 2. PICC placement  Diagnosis: Right prosthetic hip infection  Culture Result: Pending  Not on File  OPAT Orders Discharge antibiotics: Per pharmacy protocol vancomycin and ceftazidime Aim for Vancomycin trough 15-20 (unless otherwise indicated) Duration: 6 weeks End Date: 06/06/2018  [facility] Care Per Protocol:  Labs weekly while on IV antibiotics: _x_ CBC with differential _x_ BMP __ CMP _x_ CRP _x_ ESR _x_ Vancomycin trough  _x_ Please pull PIC at completion of IV antibiotics __ Please leave PIC in place until doctor has seen patient or been notified  Fax weekly labs to [phone]  Clinic Follow Up Appt: 05/28/2018  Principal Problem:   Postoperative wound dehiscence Active Problems:   Hematoma of right hip   History of total hip arthroplasty, right   Polycythemia vera (HCC)   Malnutrition of moderate degree   Scheduled Meds: . acetaminophen  1,000 mg Oral Q6H  . amLODipine  10 mg Oral Daily  . benazepril  40 mg Oral Daily  . docusate sodium  100 mg Oral BID  . feeding supplement (ENSURE ENLIVE)  237 mL Oral BID BM  . gabapentin   300 mg Oral TID  . [START ON 04/26/2018] hydroxyurea  500 mg Oral Q M,W,F  . latanoprost  1 drop Both Eyes QHS  . pantoprazole  40 mg Oral Daily   Continuous Infusions: . cefTAZidime (FORTAZ)  IV 2 g (04/25/18 0941)  . dextrose 5 % and 0.45 % NaCl with KCl 20 mEq/L 125 mL/hr at 04/25/18 0600  . vancomycin Stopped (04/24/18 2217)   PRN Meds:.acetaminophen, ALPRAZolam, alum & mag hydroxide-simeth, bisacodyl, diphenhydrAMINE, HYDROmorphone (DILAUDID) injection, loratadine, meclizine, menthol-cetylpyridinium **OR** phenol, methocarbamol **OR** methocarbamol (ROBAXIN) IV, metoCLOPramide **OR** metoCLOPramide (REGLAN) injection, ondansetron **OR** ondansetron (ZOFRAN) IV, oxyCODONE, oxyCODONE-acetaminophen, polyethylene glycol, sodium phosphate  HPI: Alejandra [name] is a 77 y.o. female who is undergone previous right hip hemiarthroplasty in 2013 and developed loosening of the stem.  She underwent revision right total hip arthroplasty on 02/04/2018.  Operative Gram stain showed no organisms and cultures were negative.  She was not treated with any postoperative antibiotics.  She also has polycythemia vera.  Postoperatively she had oozing from her incision.  She developed acute swelling of her right hip and was seen by Dr. Hal Morales on 04/11/2018.  He aspirated 220 cc of bloody fluid.  The white blood cell count was 13,000 with 90% segmented neutrophils.  Gram stain showed no organisms and records indicate that cultures were negative.  She fell out of bed and landed on her right  hip she was found by her sister and admitted on 04/12/2017.  There were no acute findings on hip x-ray.  The hip was aspirated again and 50 cc of bloody fluid were obtained.  The fluid was not cultured.  She was afebrile.  Urinalysis showed pyuria and urine culture grew E. coli.  She was treated with ceftriaxone and then transition to cephalexin.  She was discharged on cephalexin on 04/17/2018 to a skilled nursing facility.  Neither she nor her  sister are certain how long she was on cephalexin.  She continued to have wound drainage and developed proximal wound dehiscence leading to readmission on 04/23/2018.  CT scan revealed the following:   Impression: Prior total hip arthroplasty revision with large rim enhancing gas and fluid collection along the lateral aspect of the right hip, measuring up to 11.4 cm in craniocaudal dimension. The collection extends to and overlies the lateral and posterior aspect of the right intertrochanteric femur, with an underlying area of focal cortical destruction concerning for osteomyelitis and underlying hardware infection.  She underwent incision and drainage yesterday with exchange of the femoral head, stem liner.  Dr. Mayer Camel reported that the wound appeared "inflamed".  Operative Gram stain showed no white blood cells and no organisms.  Cultures are negative so far.   Review of Systems: Review of Systems  Constitutional: Negative for chills, diaphoresis and fever.  Gastrointestinal: Negative for abdominal pain, diarrhea, nausea and vomiting.  Genitourinary: Negative for dysuria.  Musculoskeletal: Positive for joint pain.    Past Medical History:  Diagnosis Date  . Arthritis   . Avascular necrosis of femur head, right ([place]) 04/23/2012  . Cellulitis of shoulder 09/28/2014  . Fracture 10/05/11   "fell and broke right hip"  . Fracture of femoral neck, right ([place]) 10/12/2011  . Glaucoma   . Glaucoma    bilateral  . Hypertension   . Polycythemia vera(238.4)   . Rhabdomyolysis 09/28/2014  . Thrombocytosis ([place]) 04/23/2012    Social History   Tobacco Use  . Smoking status: Former Smoker    Packs/day: 0.12    Types: Cigarettes    Last attempt to quit: 08/15/1991    Years since quitting: 26.7  . Smokeless tobacco: Never Used  Substance Use Topics  . Alcohol use: No    Comment: 10/12/11 "did drink in my younger; don't drink at all now"  . Drug use: No    Family History  Problem Relation  Age of Onset  . Cancer Mother   . Hypertension Father   . Asthma Sister    Not on File  OBJECTIVE: Blood pressure 107/60, pulse 79, temperature 99 F (37.2 C), temperature source Oral, resp. rate 14, height '5\' 4"'$  (1.626 m), weight 52.2 kg, SpO2 100 %.  Physical Exam  Constitutional: She is oriented to person, place, and time.  She is very pleasant and in no distress.  She is sitting up in a chair visiting with her sister.  Musculoskeletal:  Her right hip is bandaged.  Neurological: She is alert and oriented to person, place, and time.  Psychiatric: She has a normal mood and affect.    Lab Results Lab Results  Component Value Date   WBC 50.5 (HH) 04/25/2018   HGB 8.2 (L) 04/25/2018   HCT 27.0 (L) 04/25/2018   MCV 79.4 04/25/2018   PLT 1,658 (HH) 04/25/2018    Lab Results  Component Value Date   CREATININE 1.02 (H) 04/25/2018   BUN 16 04/25/2018   NA 138 04/25/2018  K 5.0 04/25/2018   CL 106 04/25/2018   CO2 23 04/25/2018    Lab Results  Component Value Date   ALT 22 04/23/2018   AST 25 04/23/2018   ALKPHOS 128 (H) 04/23/2018   BILITOT 0.8 04/23/2018     Microbiology: Recent Results (from the past 240 hour(s))  Surgical pcr screen     Status: None   Collection Time: 04/24/18  8:13 AM  Result Value Ref Range Status   MRSA, PCR NEGATIVE NEGATIVE Final   Staphylococcus aureus NEGATIVE NEGATIVE Final    Comment: (NOTE) The Xpert SA Assay (FDA approved for NASAL specimens in patients 19 years of age and older), is one component of a comprehensive surveillance program. It is not intended to diagnose infection nor to guide or monitor treatment. Performed at [facility], [place] [address] [place], [place] [address]   Aerobic/Anaerobic Culture (surgical/deep wound)     Status: None (Preliminary result)   Collection Time: 04/24/18  2:54 PM  Result Value Ref Range Status   Specimen Description   Final    TISSUE RIGHT HIP WOUND  DEHISENCE Performed at [facility] [address], [place], [place] [address]    Special Requests   Final    NONE Performed at [facility], [place] [address], [place], [place] [address]    Gram Stain   Final    NO WBC SEEN NO ORGANISMS SEEN Gram Stain Report Called to,Read Back By and Verified With: P.COTTA AT 1555 ON 04/24/18 BY N.THOMPSON Performed at [facility], [place] [address] [place], [place] [address]    Culture   Final    NO GROWTH < 24 HOURS Performed at [facility] [address], [place],  [address]    Report Status PENDING  Incomplete    Michel Bickers, MD [facility] for Infectious [place] [facility] [phone] pager   [phone] cell 04/25/2018, 11:30 AM

## 2018-04-26 LAB — CBC
HEMATOCRIT: 21.6 % — AB (ref 36.0–46.0)
Hemoglobin: 6.6 g/dL — CL (ref 12.0–15.0)
MCH: 24.1 pg — ABNORMAL LOW (ref 26.0–34.0)
MCHC: 30.6 g/dL (ref 30.0–36.0)
MCV: 78.8 fL (ref 78.0–100.0)
Platelets: 1962 10*3/uL (ref 150–400)
RBC: 2.74 MIL/uL — AB (ref 3.87–5.11)
RDW: 25.4 % — AB (ref 11.5–15.5)
WBC: 52.8 10*3/uL — AB (ref 4.0–10.5)

## 2018-04-26 LAB — CREATININE, SERUM
Creatinine, Ser: 0.93 mg/dL (ref 0.44–1.00)
GFR, EST NON AFRICAN AMERICAN: 57 mL/min — AB (ref >60)

## 2018-04-26 LAB — PREPARE RBC (CROSSMATCH)

## 2018-04-26 LAB — PROTIME-INR
INR: 1.56
PROTHROMBIN TIME: 18.6 s — AB (ref 11.4–15.2)

## 2018-04-26 LAB — APTT: aPTT: 34 seconds (ref 24–36)

## 2018-04-26 MED ORDER — PHYTONADIONE 5 MG PO TABS
10.0000 mg | ORAL_TABLET | Freq: Every day | ORAL | Status: AC
  Administered 2018-04-26 – 2018-04-28 (×3): 10 mg via ORAL
  Filled 2018-04-26 (×3): qty 2

## 2018-04-26 MED ORDER — VANCOMYCIN HCL 10 G IV SOLR
1250.0000 mg | INTRAVENOUS | Status: DC
  Administered 2018-04-26 – 2018-05-02 (×4): 1250 mg via INTRAVENOUS
  Filled 2018-04-26 (×4): qty 1250

## 2018-04-26 MED ORDER — SODIUM CHLORIDE 0.9% IV SOLUTION
Freq: Once | INTRAVENOUS | Status: AC
  Administered 2018-04-26: 10:00:00 via INTRAVENOUS

## 2018-04-26 NOTE — Care Management Important Message (Signed)
Important Message  Patient Details  Name: Alejandra [name] MRN: [id number] Date of Birth: 01/19/1941   Medicare Important Message Given:  Yes    Kerin Salen 04/26/2018, 11:30 AMImportant Message  Patient Details  Name: Alejandra [name] MRN: [id number] Date of Birth: February 18, 1941   Medicare Important Message Given:  Yes    Kerin Salen 04/26/2018, 11:30 AM

## 2018-04-26 NOTE — Progress Notes (Signed)
Physical Therapy Treatment Patient Details Name: Alejandra [name] MRN: [id number] DOB: [birth year] Today's Date: 04/26/2018    History of Present Illness 77 yo female adm with  right hip wound dehisence, hematoma-->s/p  IRRIGATION AND DEBRIDEMENT EXTREMITY RIGHT HIP WITH POLY EXCHANGE; PMH: Polycythemia vera, HTN, multiple R hip surgeries, most recent revision in  June of 2019    PT Comments    Exercise focussed session d/t pt Hgb 6.6 today, currently receiving transfusion; will continue to follow  Follow Up Recommendations  SNF;Supervision for mobility/OOB     Equipment Recommendations  None recommended by PT    Recommendations for Other Services       Precautions / Restrictions Precautions Precautions: Fall;Posterior Hip Precaution Comments: verbalizes 2 of 3 precautions Restrictions Weight Bearing Restrictions: No Other Position/Activity Restrictions: WBAT    Mobility  Bed Mobility               General bed mobility comments: deferred OOB at this time d/t decr Hgb 6.6  Transfers                    Ambulation/Gait                 Stairs             Wheelchair Mobility    Modified Rankin (Stroke Patients Only)       Balance                                            Cognition Arousal/Alertness: Awake/alert Behavior During Therapy: WFL for tasks assessed/performed                                   General Comments: pt seemed less confused today, conversing appropriately      Exercises Total Joint Exercises Ankle Circles/Pumps: AROM;Both;10 reps Quad Sets: AROM;Both;10 reps Short Arc Quad: AROM;Right;10 reps Heel Slides: AAROM;AROM;Both;10 reps;Limitations Heel Slides Limitations: incr time needed d/t muscle guarding on R Hip ABduction/ADduction: AAROM;Right;10 reps    General Comments        Pertinent Vitals/Pain Pain Assessment: Faces Faces Pain Scale: Hurts a little bit Pain  Location: R hip  Pain Descriptors / Indicators: Sore;Aching;Grimacing;Guarding Pain Intervention(s): Limited activity within patient's tolerance;Monitored during session;Premedicated before session;Ice applied    Home Living                      Prior Function            PT Goals (current goals can now be found in the care plan section) Acute Rehab PT Goals PT Goal Formulation: With patient Time For Goal Achievement: 04/29/18 Potential to Achieve Goals: Good Progress towards PT goals: Progressing toward goals    Frequency    7X/week      PT Plan Current plan remains appropriate    Co-evaluation              AM-PAC PT "6 Clicks" Daily Activity  Outcome Measure  Difficulty turning over in bed (including adjusting bedclothes, sheets and blankets)?: Unable Difficulty moving from lying on back to sitting on the side of the bed? : Unable Difficulty sitting down on and standing up from a chair with arms (e.g., wheelchair, bedside commode, etc,.)?: Unable Help needed moving to and from  a bed to chair (including a wheelchair)?: A Lot Help needed walking in hospital room?: A Lot Help needed climbing 3-5 steps with a railing? : A Lot 6 Click Score: 9    End of Session   Activity Tolerance: Patient tolerated treatment well Patient left: in bed;with call bell/phone within reach;with bed alarm set Nurse Communication: Mobility status PT Visit Diagnosis: Other abnormalities of gait and mobility (R26.89);History of falling (Z91.81)     Time: 9357-0177 PT Time Calculation (min) (ACUTE ONLY): 23 min  Charges:  $Therapeutic Exercise: 23-37 mins                     Kenyon Ana, [place] Pager: [phone] 04/26/2018   [facility] [phone]    [facility] 04/26/2018, 1:11 PM

## 2018-04-26 NOTE — Progress Notes (Signed)
CRITICAL VALUE ALERT  Critical Value: Hemoglobin -  6.6   Platelet - 1962   WBC - 52.8  Date & Time Notied:  04/26/18  Provider Notified: [phone]  Orders Received/Actions taken: Paged Ortho on call (Mr. Modena Slater) and he will inform Dr. Mayer Camel.

## 2018-04-26 NOTE — Progress Notes (Addendum)
SNF placement- [facility] initiated  9/12. CSW will inform medical staff when insurance authorization has been received.  Per physician notes, the plan is to transfuse 2 units this am.   CSW informed admission coordinator and care staff at the SNF the patient will need 6 weeks of IV antibiotics and then 6 weeks oral.  Discharge antibiotics: Per pharmacy protocol vancomycin and ceftazidime Aim for Vancomycin trough 15-20 (unless otherwise indicated) Duration: 6 weeks End Date: 06/06/2018  Hannah Beat, MSW Clinical Social Worker  [phone] 04/26/2018  10:02 AM

## 2018-04-26 NOTE — Progress Notes (Deleted)
[name], female   DOB: 19-Jun-1941, 77 y.o.   MRN: [id number]         [facility] for Infectious Disease  Date of Admission:  04/23/2018     ASSESSMENT: I cannot see any evidence of treatable infection.  I am not sure what caused her palmar erythema but it is resolving.  I believe the swelling and redness of her ankles is due to acute on chronic venous insufficiency.  She has tried compressive stockings in the past but did not use them because they were uncomfortable and hard to put on.  She states that she is willing to try them again.  PLAN: 1. Recommend discharge home with follow-up by Dr. Ruben Gottron 2. I will sign off for now  Principal Problem:   Postoperative wound dehiscence Active Problems:   Hematoma of right hip   History of total hip arthroplasty, right   Polycythemia vera (HCC)   Malnutrition of moderate degree   Scheduled Meds: . amLODipine  10 mg Oral Daily  . anagrelide  1 mg Oral BID  . benazepril  40 mg Oral Daily  . docusate sodium  100 mg Oral BID  . feeding supplement (ENSURE ENLIVE)  237 mL Oral BID BM  . gabapentin  300 mg Oral TID  . latanoprost  1 drop Both Eyes QHS  . pantoprazole  40 mg Oral Daily  . phytonadione  10 mg Oral Daily   Continuous Infusions: . cefTAZidime (FORTAZ)  IV 2 g (04/25/18 2233)  . dextrose 5 % and 0.45 % NaCl with KCl 20 mEq/L 125 mL/hr at 04/25/18 2233  . vancomycin     PRN Meds:.acetaminophen, ALPRAZolam, alum & mag hydroxide-simeth, bisacodyl, diphenhydrAMINE, HYDROmorphone (DILAUDID) injection, loratadine, meclizine, menthol-cetylpyridinium **OR** phenol, methocarbamol **OR** methocarbamol (ROBAXIN) IV, metoCLOPramide **OR** metoCLOPramide (REGLAN) injection, ondansetron **OR** ondansetron (ZOFRAN) IV, oxyCODONE, polyethylene glycol, sodium phosphate   SUBJECTIVE: She is feeling better today.  She is not as fatigued.  She feels like the redness on her palms and lower legs is improving.  She is not having  as much pain in her lower legs.  Review of Systems: Review of Systems  Constitutional: Negative for chills, diaphoresis and fever.  Cardiovascular: Positive for leg swelling.  Musculoskeletal: Positive for back pain and joint pain.    Not on File  OBJECTIVE: Vitals:   04/25/18 2131 04/26/18 0504 04/26/18 0915 04/26/18 0947  BP: (!) 97/56 126/71 (!) 144/76 (!) 152/77  Pulse: 84 82 81 89  Resp: 14 14 16 14   Temp: 98.9 F (37.2 C) 97.8 F (36.6 C) 98.2 F (36.8 C) 98 F (36.7 C)  TempSrc: Oral Oral Oral Oral  SpO2: 100% 100% 100% 100%  Weight:      Height:       Body mass index is 19.74 kg/m.  Physical Exam  Constitutional: She is oriented to person, place, and time.  She looks like she is feeling better.  She is sitting up in bed visiting with her daughter.  Musculoskeletal: She exhibits edema and tenderness.  Swelling in her lower legs has decreased.  The redness around her ankles has diminished.  She is not as tender with palpation.  Neurological: She is alert and oriented to person, place, and time.  Skin:  Her palmar erythema is resolving.    Lab Results Lab Results  Component Value Date   WBC 52.8 (HH) 04/26/2018   HGB 6.6 (LL) 04/26/2018   HCT 21.6 (L) 04/26/2018   MCV 78.8  04/26/2018   PLT 1,962 (HH) 04/26/2018    Lab Results  Component Value Date   CREATININE 0.93 04/26/2018   BUN 16 04/25/2018   NA 138 04/25/2018   K 5.0 04/25/2018   CL 106 04/25/2018   CO2 23 04/25/2018    Lab Results  Component Value Date   ALT 22 04/23/2018   AST 25 04/23/2018   ALKPHOS 128 (H) 04/23/2018   BILITOT 0.8 04/23/2018     Microbiology: Recent Results (from the past 240 hour(s))  Surgical pcr screen     Status: None   Collection Time: 04/24/18  8:13 AM  Result Value Ref Range Status   MRSA, PCR NEGATIVE NEGATIVE Final   Staphylococcus aureus NEGATIVE NEGATIVE Final    Comment: (NOTE) The Xpert SA Assay (FDA approved for NASAL specimens in patients  85 years of age and older), is one component of a comprehensive surveillance program. It is not intended to diagnose infection nor to guide or monitor treatment. Performed at [facility], [place] [address], [place], [place] [address]   Aerobic/Anaerobic Culture (surgical/deep wound)     Status: None (Preliminary result)   Collection Time: 04/24/18  2:54 PM  Result Value Ref Range Status   Specimen Description   Final    TISSUE RIGHT HIP WOUND DEHISENCE Performed at [facility] [address] [place], [place] [address]    Special Requests   Final    NONE Performed at [facility], [place] [address] [place], [place] [address]    Gram Stain   Final    NO WBC SEEN NO ORGANISMS SEEN Gram Stain Report Called to,Read Back By and Verified With: P.COTTA AT 1555 ON 04/24/18 BY N.THOMPSON Performed at [facility], [place] [address], [place], [place] [address]    Culture   Final    CULTURE REINCUBATED FOR BETTER GROWTH CRITICAL RESULT CALLED TO, READ BACK BY AND VERIFIED WITH: RN C NORRIS 04/26/18 AT 0909 BY CM NO ANAEROBES ISOLATED; CULTURE IN PROGRESS FOR 5 DAYS    Report Status PENDING  Incomplete  Urine Culture     Status: None   Collection Time: 04/24/18  2:57 PM  Result Value Ref Range Status   Specimen Description   Final    URINE, CATHETERIZED Performed at [facility], [place] [address] [place], [place] [address]    Special Requests   Final    NONE Performed at [facility], [place] [address], [place], [place] [address]    Culture   Final    NO GROWTH Performed at [facility], [place] [address], [place], [place] [address]    Report Status 04/25/2018 FINAL  Final    Michel Bickers, MD [place] for Infectious [place] [facility] [phone] pager   [phone] cell 04/26/2018, 11:13 AM

## 2018-04-26 NOTE — Progress Notes (Addendum)
PHARMACY CONSULT NOTE FOR:  OUTPATIENT  PARENTERAL ANTIBIOTIC THERAPY (OPAT)  Indication: prosthetic hip infection Regimen: vancomycin 1250mg  IV Q48h, ceftazidime 2gm IV q12h End date: 06/06/18  IV antibiotic discharge orders are pended. To discharging provider:  please sign these orders via discharge navigator,  Select New Orders & click on the button choice - Manage This Unsigned Work.     Thank you for allowing pharmacy to be a part of this patient's care.  Dolly Rias RPh 04/26/2018, 1:12 PM Pager [phone]

## 2018-04-26 NOTE — Progress Notes (Signed)
[employer] received. Patient can discharge to SNF[facility] once medically stable.   Kathrin Greathouse, Marlinda Mike, MSW Clinical Social Worker  [phone] 04/26/2018  3:09 PM

## 2018-04-26 NOTE — Progress Notes (Signed)
Patient has had only 1 unmeasured urine occurrence since 7am. Bladder scan on patient showed 181ml and she reports that she "doesn't have to go". RN will continue to monitor closely.

## 2018-04-26 NOTE — Progress Notes (Signed)
IP PROGRESS NOTE  Subjective:   Alejandra [name] reports soreness at the right hip.  No other complaint.   Objective: Vital signs in last 24 hours: Blood pressure 126/71, pulse 82, temperature 97.8 F (36.6 C), temperature source Oral, resp. rate 14, height 5\' 4"  (1.626 m), weight 115 lb (52.2 kg), SpO2 100 %.  Intake/Output from previous day: 09/12 0701 - 09/13 0700 In: 3350.7 [P.O.:920; I.V.:2190.7; IV Piggyback:200] Out: 290 [Urine:100; Drains:190]  Physical Exam:   Extremities: No leg edema Musculoskeletal: Dressing at the right thigh wound is dry.  Bloody drainage in the right thigh drains.   Lab Results: Recent Labs    04/25/18 0450 04/26/18 0359  WBC 50.5* 52.8*  HGB 8.2* 6.6*  HCT 27.0* 21.6*  PLT 1,658* 1,962*   PT 18.6 BMET Recent Labs    04/23/18 1742 04/25/18 0450 04/26/18 0359  NA 139 138  --   K 4.0 5.0  --   CL 103 106  --   CO2 28 23  --   GLUCOSE 111* 173*  --   BUN 15 16  --   CREATININE 0.85 1.02* 0.93  CALCIUM 9.1 8.4*  --      Studies/Results: No results found.  Medications: I have reviewed the patient's current medications.  Assessment/Plan: 1. Polycythemia vera on hydroxyurea. Last phlebotomy 09/08/2015  Hydroxyurea dose increased to 1500 mg daily 06/26/2016  Hydroxyurea changed to 1500 mg daily on Monday through Friday, 1000 mg on Saturday and Sunday beginning 09/11/2016  Hydroxyurea changed to 1500 mg Monday Wednesday and Friday and 1000 mg other days beginning 10/10/2016  Hydroxyurea changed to 1000 mg daily beginning 10/31/2016  Hydroxyurea placed on hold beginning 11/27/2016-she misunderstood our instructions and continued 1000 mg daily.  Hydroxyurea dose decreased to 500 mg daily beginning 01/01/2017  Hydroxyurea dose increased 06/05/2017  Hydroxyurea dose increased to 1000 mg daily except for 500 mg on Monday, Wednesday, and Friday, 11/01/2017  Hydroxyurea discontinued 04/24/2018  Anagrelide started  04/24/2018 2. Hypertension, followed by Dr. Montez Morita.  3. History of hyperpigmentation at the hands. Question related to hydroxyurea. 4. Hip fracture February 2013. She underwent a bipolar hip arthroplasty on 04/23/2012. 5. Thrombocytosis. Most likely secondary to polycythemia vera and iron deficiency. 6. Hospitalization with left shoulder cellulitis February 2016. 7. Pelvic fracture after a fall 05/31/2015 8. Weight loss-etiology unclear; weight stable 12/16/2015 and06/15/2017 9. Right anterior buccal ulcer 03/09/2016 10. Revision of right hip arthroplasty 0/93/2671, complicated by intraoperative and postoperative bleeding requiring multiple red cell transfusions 11. Swelling at the right hip surgical site week of 04/08/2018- 220 cc bloody fluid aspirated from the right hip surgical site 04/11/2018  Right thigh wound dehiscence prompting readmission 04/23/2018  CT 04/23/2018- gas/fluid collection at the lateral right hip with area of focal cortical destruction concerning for infection  Irrigation/debridement and replacement of right hip hardware 04/24/2018 12.  Coagulopathy-likely secondary to antibiotics and multiple transfusions 13.  Anemia secondary to surgical blood loss  Alejandra [name] appears stable.  She continues to have bleeding from the surgical drains.  I discussed the case with orthopedics this morning.  They indicate she has less bleeding than with the previous surgery.  I recommend another dose of DDAVP if she continues to have significant bleeding.  A platelet transfusion can be administered if the DDAVP is not helpful.  Orthopedics will contact us if they feel the bleeding is out of proportion to normal blood loss following hip revision surgery.  I will place her on vitamin K for the  coagulopathy.  She began anagrelide yesterday.  I will increase the anagrelide dose if the platelet count is not responding by next week.    Recommendations: 1.  Continue anagrelide 2.  Oral  vitamin K 3.  Transfuse packed red blood cells today 4.  DDAVP/platelets for persistent bleeding 5.  Antibiotics as recommended by infectious disease  Hematology will check on her over the weekend.     LOS: 3 days   Betsy Coder, MD   04/26/2018, 7:05 AM

## 2018-04-26 NOTE — Progress Notes (Signed)
[name]  MRN: [id number]  DOB/AGE:  December 04, 1940 / 77 y.o.  2 Days Post-Op Procedure(s) (LRB): IRRIGATION AND DEBRIDEMENT EXTREMITY RIGHT HIP WITH POLY EXCHANGE POSTERIOR LATERAL POSITION,stem and head exchange (Right)    PROGRESS NOTE Subjective: Patient is alert, oriented, no Nausea, no Vomiting, yes passing gas, . Taking PO well. Denies SOB, Chest or Calf Pain. Using Incentive Spirometer, PAS in place. Ambulate WBAT Patient reports pain as  mild .    Objective: Vital signs in last 24 hours: Vitals:   04/25/18 0909 04/25/18 1332 04/25/18 2131 04/26/18 0504  BP: 107/60 130/71 (!) 97/56 126/71  Pulse: 79 86 84 82  Resp: 14 16 14 14   Temp: 99 F (37.2 C) 98.9 F (37.2 C) 98.9 F (37.2 C) 97.8 F (36.6 C)  TempSrc: Oral Oral Oral Oral  SpO2: 100% 100% 100% 100%  Weight:      Height:          Intake/Output from previous day: I/O last 3 completed shifts: In: 5422.6 [P.O.:1340; I.V.:3442.6; Other:40; IV Piggyback:600.1] Out: 790 [Urine:500; Drains:290]   Intake/Output this shift: No intake/output data recorded.   LABORATORY DATA: Recent Labs    04/23/18 1742 04/24/18 0513  04/25/18 0450 04/26/18 0359  WBC 41.1*  --   --  50.5* 52.8*  HGB 11.6*  --    < > 8.2* 6.6*  HCT 39.3  --    < > 27.0* 21.6*  PLT 1,911*  --   --  1,658* 1,962*  NA 139  --   --  138  --   K 4.0  --   --  5.0  --   CL 103  --   --  106  --   CO2 28  --   --  23  --   BUN 15  --   --  16  --   CREATININE 0.85  --   --  1.02* 0.93  GLUCOSE 111*  --   --  173*  --   INR 1.38 1.37  --   --  1.56  CALCIUM 9.1  --   --  8.4*  --    < > = values in this interval not displayed.    Examination: Neurologically intact Neurovascular intact Sensation intact distally Intact pulses distally Dorsiflexion/Plantar flexion intact Incision: dressing C/D/I No cellulitis present Compartment soft} XR AP&Lat of hip shows well placed\fixed THA  Dressing was changed yesterday as it had bled  through.  Now clean and dry.  Drains put out 190 yesterday.  Assessment:   2 Days Post-Op Procedure(s) (LRB): IRRIGATION AND DEBRIDEMENT EXTREMITY RIGHT HIP WITH POLY EXCHANGE POSTERIOR LATERAL POSITION,stem and head exchange (Right) ADDITIONAL DIAGNOSIS:  Expected Acute Blood Loss Anemia, Polycythemia Vera  Plan: PT/OT WBAT, posterior THA  DVT Prophylaxis: SCDx72 hrs,    DISCHARGE PLAN: Skilled Nursing Facility/Rehab  DISCHARGE NEEDS:  HHPT, Walker, 3-in-1 comode seat and IV Antibiotics  Antibiotics per infectious diseases.  Anticipate 6 weeks IV and then 6 weeks oral.  Drains will remain in place at least for another 48 hours, although there was little if any drainage noted this morning.  Plan to transfuse 2 units this AM.  Will discuss another dose of ddavp with Dr. Mayer Camel as Dr. Benay Spice said this would be another option.

## 2018-04-26 NOTE — Progress Notes (Signed)
Actual start time 0926 on blood.

## 2018-04-26 NOTE — Progress Notes (Addendum)
[name], female   DOB: 05/04/1941, 77 y.o.   MRN: [id number]         [facility]  Date of Admission:  04/23/2018           Day 3 vancomycin        Day 3 ceftazidime ASSESSMENT: Her operative cultures remain negative at 48 hours, possibly due to recent treatment for a UTI.  PLAN: 1. Continue vancomycin and ceftazidime pending final cultures 2. Please call Dr. Dietrich [place] [phone]) for any infectious disease questions this weekend  Diagnosis: Right prosthetic hip infection  Culture Result: Pending  Not on File  OPAT Orders Discharge antibiotics: Per pharmacy protocol vancomycin and ceftazidime Aim for Vancomycin trough 15-20 (unless otherwise indicated) Duration: 6 weeks End Date: 06/06/2018  [facility] Care Per Protocol:  Labs weekly while on IV antibiotics: _x_ CBC with differential _x_ BMP __ CMP _x_ CRP _x_ ESR _x_ Vancomycin trough  _x_ Please pull PIC at completion of IV antibiotics __ Please leave PIC in place until doctor has seen patient or been notified  Fax weekly labs to [phone]  Clinic Follow Up Appt: 05/28/2018  Principal Problem:   Postoperative wound dehiscence Active Problems:   Hematoma of right hip   History of total hip arthroplasty, right   Polycythemia vera (HCC)   Malnutrition of moderate degree   Scheduled Meds: . amLODipine  10 mg Oral Daily  . anagrelide  1 mg Oral BID  . benazepril  40 mg Oral Daily  . docusate sodium  100 mg Oral BID  . feeding supplement (ENSURE ENLIVE)  237 mL Oral BID BM  . gabapentin  300 mg Oral TID  . latanoprost  1 drop Both Eyes QHS  . pantoprazole  40 mg Oral Daily  . phytonadione  10 mg Oral Daily   Continuous Infusions: . cefTAZidime (FORTAZ)  IV 2 g (04/25/18 2233)  . dextrose 5 % and 0.45 % NaCl with KCl 20 mEq/L 125 mL/hr at 04/25/18 2233  . vancomycin     PRN Meds:.acetaminophen, ALPRAZolam, alum & mag hydroxide-simeth,  bisacodyl, diphenhydrAMINE, HYDROmorphone (DILAUDID) injection, loratadine, meclizine, menthol-cetylpyridinium **OR** phenol, methocarbamol **OR** methocarbamol (ROBAXIN) IV, metoCLOPramide **OR** metoCLOPramide (REGLAN) injection, ondansetron **OR** ondansetron (ZOFRAN) IV, oxyCODONE, polyethylene glycol, sodium phosphate   SUBJECTIVE: She is having more right hip pain today.  Review of Systems: Review of Systems  Constitutional: Negative for chills and fever.  Musculoskeletal: Positive for joint pain.    Not on File  OBJECTIVE: Vitals:   04/25/18 2131 04/26/18 0504 04/26/18 0915 04/26/18 0947  BP: (!) 97/56 126/71 (!) 144/76 (!) 152/77  Pulse: 84 82 81 89  Resp: '14 14 16 14  '$ Temp: 98.9 F (37.2 C) 97.8 F (36.6 C) 98.2 F (36.8 C) 98 F (36.7 C)  TempSrc: Oral Oral Oral Oral  SpO2: 100% 100% 100% 100%  Weight:      Height:       Body mass index is 19.74 kg/m.  Physical Exam  Constitutional: She is oriented to person, place, and time.  She is sitting up in a chair.  Musculoskeletal:  Her operative drains remain in her right hip.  Neurological: She is alert and oriented to person, place, and time.  Psychiatric: She has a normal mood and affect.    Lab Results Lab Results  Component Value Date   WBC 52.8 (HH) 04/26/2018   HGB 6.6 (LL) 04/26/2018   HCT 21.6 (L) 04/26/2018  MCV 78.8 04/26/2018   PLT 1,962 (HH) 04/26/2018    Lab Results  Component Value Date   CREATININE 0.93 04/26/2018   BUN 16 04/25/2018   NA 138 04/25/2018   K 5.0 04/25/2018   CL 106 04/25/2018   CO2 23 04/25/2018    Lab Results  Component Value Date   ALT 22 04/23/2018   AST 25 04/23/2018   ALKPHOS 128 (H) 04/23/2018   BILITOT 0.8 04/23/2018     Microbiology: Recent Results (from the past 240 hour(s))  Surgical pcr screen     Status: None   Collection Time: 04/24/18  8:13 AM  Result Value Ref Range Status   MRSA, PCR NEGATIVE NEGATIVE Final   Staphylococcus aureus NEGATIVE  NEGATIVE Final    Comment: (NOTE) The Xpert SA Assay (FDA approved for NASAL specimens in patients 67 years of age and older), is one component of a comprehensive surveillance program. It is not intended to diagnose infection nor to guide or monitor treatment. Performed at [facility], [place] [address] [place], [place] [address]   Aerobic/Anaerobic Culture (surgical/deep wound)     Status: None (Preliminary result)   Collection Time: 04/24/18  2:54 PM  Result Value Ref Range Status   Specimen Description   Final    TISSUE RIGHT HIP WOUND DEHISENCE Performed at [place] [address] [place], [place] [address]    Special Requests   Final    NONE Performed at [facility], [place] [address], [place], [place] [address]    Gram Stain   Final    NO WBC SEEN NO ORGANISMS SEEN Gram Stain Report Called to,Read Back By and Verified With: P.COTTA AT 1555 ON 04/24/18 BY N.THOMPSON Performed at [facility], [place] [address], [place], [place] [address]    Culture   Final    CULTURE REINCUBATED FOR BETTER GROWTH CRITICAL RESULT CALLED TO, READ BACK BY AND VERIFIED WITH: RN C NORRIS 04/26/18 AT 0909 BY CM NO ANAEROBES ISOLATED; CULTURE IN PROGRESS FOR 5 DAYS    Report Status PENDING  Incomplete  Urine Culture     Status: None   Collection Time: 04/24/18  2:57 PM  Result Value Ref Range Status   Specimen Description   Final    URINE, CATHETERIZED Performed at [facility], [place] [address] [place], [place] [address]    Special Requests   Final    NONE Performed at [facility], [place] [address], [place], [place] [address]    Culture   Final    NO GROWTH Performed at [facility], [place] [address] [place],  [address]    Report Status 04/25/2018 FINAL  Final    Michel Bickers, MD [place] [phone]  pager   [phone] cell 04/26/2018, 11:32 AM

## 2018-04-26 NOTE — Progress Notes (Signed)
Physical Therapy Treatment Patient Details Name: Alejandra [name] MRN: [id number] DOB: 1940/10/02 Today's Date: 04/26/2018    History of Present Illness 77 yo female adm with  right hip wound dehisence, hematoma-->s/p  IRRIGATION AND DEBRIDEMENT EXTREMITY RIGHT HIP WITH POLY EXCHANGE; PMH: Polycythemia vera, HTN, multiple R hip surgeries, most recent revision in  June of 2019    PT Comments    Pt progressing slowly; continues to exhibit tremors with transitional movements , requiring +2 assist for basic mobility; continue to recommend SNF, continue PT POC  Follow Up Recommendations  SNF;Supervision for mobility/OOB     Equipment Recommendations  None recommended by PT    Recommendations for Other Services       Precautions / Restrictions Precautions Precautions: Fall;Posterior Hip Precaution Comments: verbalizes 2 of 3 precautions Restrictions Weight Bearing Restrictions: No Other Position/Activity Restrictions: WBAT    Mobility  Bed Mobility Overal bed mobility: Needs Assistance Bed Mobility: Supine to Sit     Supine to sit: Mod assist     General bed mobility comments: assist with LEs off bed, and to elevate trunk, pt with severe tremors upon sitting ( RN present)  Transfers Overall transfer level: Needs assistance Equipment used: Rolling walker (2 wheeled) Transfers: Sit to/from [facility] Sit to Stand: Mod assist;Min assist;+2 safety/equipment;+2 physical assistance Stand pivot transfers: Min assist;Mod assist;+2 physical assistance;+2 safety/equipment       General transfer comment: assist for power up and steadying. Verbal cuing for hand placement and THP; very shaky with stand pivot  Ambulation/Gait Ambulation/Gait assistance: Min assist;+2 safety/equipment;+2 physical assistance Gait Distance (Feet): 6 Feet Assistive device: Rolling walker (2 wheeled) Gait Pattern/deviations: Step-to pattern;Decreased stride length;Antalgic;Decreased  weight shift to right;Decreased stance time - right Gait velocity: decr   General Gait Details: Min assist for steadying, maintaining precautions, step by cues for sequencing RW/LEs   Stairs             Wheelchair Mobility    Modified Rankin (Stroke Patients Only)       Balance Overall balance assessment: History of Falls;Needs assistance Sitting-balance support: Bilateral upper extremity supported;Single extremity supported;Feet supported Sitting balance-Leahy Scale: Poor Sitting balance - Comments: pt unable to maintain static sitting without UE support; initial heavy posterior bias requiring assist  and multi-modal cues to achieve and maintain midline   Standing balance support: Bilateral upper extremity supported Standing balance-Leahy Scale: Poor Standing balance comment: relies on RW and external assist                            Cognition Arousal/Alertness: Awake/alert Behavior During Therapy: [facility] for tasks assessed/performed                           Following Commands: Follows one step commands with increased time;Follows multi-step commands inconsistently Safety/Judgement: Decreased awareness of deficits   Problem Solving: Difficulty sequencing;Requires verbal cues;Requires tactile cues        Exercises      General Comments        Pertinent Vitals/Pain Pain Assessment: Faces Faces Pain Scale: Hurts little more Pain Location: R hip  Pain Descriptors / Indicators: Sore;Aching;Grimacing;Guarding Pain Intervention(s): Limited activity within patient's tolerance;Monitored during session;Repositioned    Home Living                      Prior Function  PT Goals (current goals can now be found in the care plan section) Acute Rehab PT Goals PT Goal Formulation: With patient Time For Goal Achievement: 04/29/18 Potential to Achieve Goals: Good Progress towards PT goals: Progressing toward goals     Frequency    7X/week      PT Plan Current plan remains appropriate    Co-evaluation              AM-PAC PT "6 Clicks" Daily Activity  Outcome Measure  Difficulty turning over in bed (including adjusting bedclothes, sheets and blankets)?: Unable Difficulty moving from lying on back to sitting on the side of the bed? : Unable Difficulty sitting down on and standing up from a chair with arms (e.g., wheelchair, bedside commode, etc,.)?: Unable Help needed moving to and from a bed to chair (including a wheelchair)?: A Lot Help needed walking in hospital room?: A Lot Help needed climbing 3-5 steps with a railing? : A Lot 6 Click Score: 9    End of Session Equipment Utilized During Treatment: Gait belt Activity Tolerance: Patient tolerated treatment well Patient left: in chair;with call bell/phone within reach;with chair alarm set Nurse Communication: Mobility status PT Visit Diagnosis: Other abnormalities of gait and mobility (R26.89);History of falling (Z91.81)     Time: 9532-0233 PT Time Calculation (min) (ACUTE ONLY): 24 min  Charges:  $Therapeutic Activity: 23-37 mins                        [facility] 04/26/2018, 6:01 PM

## 2018-04-27 LAB — CBC
HCT: 29.8 % — ABNORMAL LOW (ref 36.0–46.0)
Hemoglobin: 9.3 g/dL — ABNORMAL LOW (ref 12.0–15.0)
MCH: 25.6 pg — AB (ref 26.0–34.0)
MCHC: 31.2 g/dL (ref 30.0–36.0)
MCV: 82.1 fL (ref 78.0–100.0)
PLATELETS: 1778 10*3/uL — AB (ref 150–400)
RBC: 3.63 MIL/uL — ABNORMAL LOW (ref 3.87–5.11)
RDW: 21.4 % — AB (ref 11.5–15.5)
WBC: 41.6 10*3/uL — ABNORMAL HIGH (ref 4.0–10.5)

## 2018-04-27 LAB — CREATININE, SERUM
CREATININE: 0.79 mg/dL (ref 0.44–1.00)
GFR calc Af Amer: >60 mL/min (ref >60)
GFR calc non Af Amer: >60 mL/min (ref >60)

## 2018-04-27 LAB — FACTOR 8 RISTOCETIN COFACTOR: RISTOCETIN CO-FACTOR, PLASMA: 100 % (ref 50–200)

## 2018-04-27 LAB — VON WILLEBRAND ANTIGEN: VON WILLEBRAND ANTIGEN, PLASMA: 252 % — AB (ref 50–200)

## 2018-04-27 NOTE — Progress Notes (Signed)
Subjective: The patient is seen and examined today.  Her sister was at the bedside.  She is feeling fine today with no concerning complaints.  She has no significant bleeding today.  She denied having any fever or chills.  She has no nausea, vomiting, diarrhea or constipation.  Objective: Vital signs in last 24 hours: Temp:  [98.2 F (36.8 C)-99.1 F (37.3 C)] 99.1 F (37.3 C) (09/14 1321) Pulse Rate:  [82-92] 92 (09/14 1321) Resp:  [15] 15 (09/14 1321) BP: (116-136)/(59-79) 126/59 (09/14 1321) SpO2:  [99 %-100 %] 100 % (09/14 1321)  Intake/Output from previous day: 09/13 0701 - 09/14 0700 In: 4479.7 [P.O.:940; I.V.:2359.6; Blood:630; IV Piggyback:550.1] Out: 1030 [Urine:850; Drains:180] Intake/Output this shift: Total I/O In: 2123.8 [P.O.:1020; I.V.:1003.8; IV Piggyback:100] Out: 1500 [Urine:1500]  General appearance: alert, cooperative and no distress Resp: clear to auscultation bilaterally Cardio: regular rate and rhythm, S1, S2 normal, no murmur, click, rub or gallop GI: soft, non-tender; bowel sounds normal; no masses,  no organomegaly Extremities: extremities normal, atraumatic, no cyanosis or edema  Lab Results:  Recent Labs    04/26/18 0359 04/27/18 0358  WBC 52.8* 41.6*  HGB 6.6* 9.3*  HCT 21.6* 29.8*  PLT 1,962* 1,778*   BMET Recent Labs    04/25/18 0450 04/26/18 0359 04/27/18 0358  NA 138  --   --   K 5.0  --   --   CL 106  --   --   CO2 23  --   --   GLUCOSE 173*  --   --   BUN 16  --   --   CREATININE 1.02* 0.93 0.79  CALCIUM 8.4*  --   --     Studies/Results: No results found.  Medications: I have reviewed the patient's current medications.  Assessment/Plan: This is a very pleasant 77 years old African-American female with history of polycythemia vera and probably essential thrombocythemia with significant elevation of platelets count.  She has been on treatment in the past with hydroxyurea which was discontinued recently.  She is currently  on treatment with anagrelide and tolerating it well. Repeat CBC today showed improvement of her hemoglobin and hematocrit as well as mild drop in the elevated platelets count.  The patient is currently asymptomatic. We will continue with the current management with anagrelide and close monitoring of her blood count. I will see her tomorrow and Dr. Benay Spice will resume her care on Monday.  Please call if you have any questions.  LOS: 4 days    Eilleen Kempf 04/27/2018

## 2018-04-27 NOTE — Progress Notes (Signed)
Physical Therapy Treatment Patient Details Name: Alejandra [name] MRN: [id number] DOB: 05-Mar-1941 Today's Date: 04/27/2018    History of Present Illness 77 yo female adm with  right hip wound dehisence, hematoma-->s/p  IRRIGATION AND DEBRIDEMENT EXTREMITY RIGHT HIP WITH POLY EXCHANGE; PMH: Polycythemia vera, HTN, multiple R hip surgeries, most recent revision in  June of 2019    PT Comments    POD # 3  Pt back in bed this afternoon with c/o fatigue and increased pain.  Not yet time fro pain meds.  Performed THR TE's followed by ICE.  Pt progressing slowly and plans to D/C to SNF.   Follow Up Recommendations  SNF;Supervision for mobility/OOB     Equipment Recommendations  None recommended by PT    Recommendations for Other Services       Precautions / Restrictions Precautions Precautions: Fall;Posterior Hip Precaution Comments: verbalizes 2 of 3 precautions so re educated  Restrictions Weight Bearing Restrictions: No Other Position/Activity Restrictions: WBAT    Mobility  Bed Mobility                  Transfers                    Ambulation/Gait                 Stairs             Wheelchair Mobility    Modified Rankin (Stroke Patients Only)       Balance                                            Cognition Arousal/Alertness: Awake/alert Behavior During Therapy: WFL for tasks assessed/performed Overall Cognitive Status: Difficult to assess Area of Impairment: Safety/judgement;Following commands;Attention;Problem solving                     Memory: Decreased recall of precautions;Decreased short-term memory Following Commands: Follows one step commands with increased time;Follows multi-step commands inconsistently       General Comments: slow      Exercises   Total Hip Replacement TE's 10 reps ankle pumps 10 reps knee presses 10 reps heel slides 10 reps SAQ's 10 reps ABD Followed by ICE    General Comments        Pertinent Vitals/Pain Pain Assessment: Faces Faces Pain Scale: Hurts even more Pain Location: R hip  Pain Descriptors / Indicators: Sore;Aching;Grimacing;Guarding;Operative site guarding Pain Intervention(s): Monitored during session;Repositioned;Ice applied    Home Living                      Prior Function            PT Goals (current goals can now be found in the care plan section) Progress towards PT goals: Progressing toward goals    Frequency    7X/week      PT Plan Current plan remains appropriate    Co-evaluation              AM-PAC PT "6 Clicks" Daily Activity  Outcome Measure  Difficulty turning over in bed (including adjusting bedclothes, sheets and blankets)?: Unable Difficulty moving from lying on back to sitting on the side of the bed? : Unable Difficulty sitting down on and standing up from a chair with arms (e.g., wheelchair, bedside commode, etc,.)?: Unable Help needed moving  to and from a bed to chair (including a wheelchair)?: Total Help needed walking in hospital room?: Total Help needed climbing 3-5 steps with a railing? : Total 6 Click Score: 6    End of Session   Activity Tolerance: Patient limited by fatigue;Patient limited by pain Patient left: in bed   PT Visit Diagnosis: Other abnormalities of gait and mobility (R26.89);History of falling (Z91.81)     Time: 9914-4458 PT Time Calculation (min) (ACUTE ONLY): 16 min  Charges:  $Therapeutic Exercise: 8-22 mins                     Rica Koyanagi  PTA Acute  Rehabilitation Services Pager      [phone] Office      [phone]

## 2018-04-27 NOTE — Progress Notes (Signed)
    Patient doing well, minimal pain about hip, sitting at edge of bed and eager for breakfast. She states she was up and ambulating yesterday. She feels better after receiving 2U PRBC's. + passing gas, NL B/B function, denies SOB/dizziness/fatigue/N/V/D   Physical Exam:  BP 136/79   Pulse 88   Temp 98.2 F (36.8 C) (Oral)   Resp 14   Ht 5\' 4"  (1.626 m)   Wt 52.2 kg   SpO2 99%   BMI 19.74 kg/m   Dressing in place NVI  I/O last 3 completed shifts: In: 6353.6 [P.O.:1260; I.V.:3773.5; Blood:630; Other:40; IV Piggyback:650.1] Out: 1180 [Urine:950; Drains:230]  Upper superficial drain was out when I evaluated the pt. I removed the sutures that were left behind without difficulty and there was no active drainage from the portals. Lower deep drain was in place and reinforced with tape, put out 30CC. Dr Mayer Camel requests to maintain drain until tomorrow at minimum.   CBC Latest Ref Rng & Units 04/27/2018 04/26/2018 04/25/2018  WBC 4.0 - 10.5 K/uL 41.6(H) 52.8(HH) 50.5(HH)  Hemoglobin 12.0 - 15.0 g/dL 9.3(L) 6.6(LL) 8.2(L)  Hematocrit 36.0 - 46.0 % 29.8(L) 21.6(L) 27.0(L)  Platelets 150 - 400 K/uL 1,778(HH) 1,962(HH) 1,658(HH)    POD #3 s/p I&D R hip with poly exchange post lat per Dr Shaune Spittle team. Complicated by polycythemia vera and infection, ID and hematology on board   - Maintain drain until tomorrow am - Reinforce dressing   - WBAT, cont PT/OT, Post THA precautions - DVT Prophylaxis: SCD's - Pain minimal and well controlled on current regimen - D/C to SNF pending progress  -ID on board, cont Vanc per their protocol   -Anticipate 6wks IV ABX then transition to 6wks PO ABX  - Hematology on board, Hg up to 9.6 post 2U transfused, likely hold on repeat dose ddvap pending Dr Gearldine Shown rec's

## 2018-04-27 NOTE — Plan of Care (Signed)
Pt remains stable though weak and shaky with fine tremors this am. Pt upper hip hemovac was found to be pulled out by nursing staff. Md aware and lower hemovac was reinforced per md orders. No changes needed to care plans. Pt to possible d/c tomorrow.

## 2018-04-28 ENCOUNTER — Inpatient Hospital Stay: Payer: Self-pay

## 2018-04-28 LAB — TYPE AND SCREEN
ABO/RH(D): A POS
Antibody Screen: POSITIVE
DAT, IgG: NEGATIVE
Donor AG Type: NEGATIVE
Donor AG Type: NEGATIVE
Donor AG Type: NEGATIVE
UNIT DIVISION: 0
UNIT DIVISION: 0
UNIT DIVISION: 0

## 2018-04-28 LAB — BPAM RBC
BLOOD PRODUCT EXPIRATION DATE: 201909232359
Blood Product Expiration Date: 201910052359
Blood Product Expiration Date: 201910052359
ISSUE DATE / TIME: 201909131149
ISSUE DATE / TIME: 201909112219
ISSUE DATE / TIME: 201909130910
UNIT TYPE AND RH: 6200
UNIT TYPE AND RH: 6200
Unit Type and Rh: 6200

## 2018-04-28 LAB — CREATININE, SERUM
Creatinine, Ser: 0.81 mg/dL (ref 0.44–1.00)
GFR calc Af Amer: >60 mL/min (ref >60)
GFR calc non Af Amer: >60 mL/min (ref >60)

## 2018-04-28 LAB — PROTIME-INR
INR: 1.5
Prothrombin Time: 17.9 seconds — ABNORMAL HIGH (ref 11.4–15.2)

## 2018-04-28 LAB — LACTATE DEHYDROGENASE: LDH: 416 U/L — AB (ref 98–192)

## 2018-04-28 NOTE — Progress Notes (Signed)
Physical Therapy Treatment Patient Details Name: Alejandra [name] MRN: [id number] DOB: January 17, 1941 Today's Date: 04/28/2018    History of Present Illness 77 yo female adm with  right hip wound dehisence, hematoma-->s/p  IRRIGATION AND DEBRIDEMENT EXTREMITY RIGHT HIP WITH POLY EXCHANGE; PMH: Polycythemia vera, HTN, multiple R hip surgeries, most recent revision in  June of 2019    PT Comments    Patient received in bed and agreeable to PT session. PT session focusing on improving safety and independence with functional mobility and transfers. Patient requiring Mod A for supine to sit EOB as well as Mod A +2 for sit to stand and stand pivot to recliner. Does continue to exhibit tremors? while seated and standing. Will continue to follow with PT recommendation appropriate.     Follow Up Recommendations  SNF;Supervision for mobility/OOB     Equipment Recommendations  None recommended by PT    Recommendations for Other Services       Precautions / Restrictions Precautions Precautions: Fall;Posterior Hip Precaution Comments: able to recall2/3 precautions - re-educated Restrictions Weight Bearing Restrictions: No Other Position/Activity Restrictions: WBAT    Mobility  Bed Mobility Overal bed mobility: Needs Assistance Bed Mobility: Supine to Sit     Supine to sit: Mod assist     General bed mobility comments: use of bed pad; physical assist ot bring LE to EOB and for trunk righting  Transfers Overall transfer level: Needs assistance Equipment used: Rolling walker (2 wheeled) Transfers: Sit to/from [facility] Sit to Stand: Mod assist;+2 physical assistance;+2 safety/equipment Stand pivot transfers: Mod assist;+2 physical assistance;+2 safety/equipment       General transfer comment: Mod A + 2 to power up from bedside; consistent cueing throughout for safety and sequencing  Ambulation/Gait             General Gait Details: deferred for patient/therapist  safety - very unsteady with transfers with difficult weight shift onto R LE as well as LE clearance from floor   Stairs             Wheelchair Mobility    Modified Rankin (Stroke Patients Only)       Balance                                            Cognition Arousal/Alertness: Awake/alert Behavior During Therapy: WFL for tasks assessed/performed Overall Cognitive Status: No family/caregiver present to determine baseline cognitive functioning Area of Impairment: Following commands;Safety/judgement;Problem solving                       Following Commands: Follows one step commands with increased time Safety/Judgement: Decreased awareness of safety;Decreased awareness of deficits   Problem Solving: Difficulty sequencing;Requires verbal cues;Requires tactile cues General Comments: requires VC/TC for all mobility; slow to process      Exercises      General Comments General comments (skin integrity, edema, etc.): noted bright red blood on bed sheets following supine to sit transfer - notified nursing with PT assisting in applying pressure to wound to allow for nursing staff to re-dress/reinforce pressure      Pertinent Vitals/Pain Pain Assessment: No/denies pain    Home Living                      Prior Function  PT Goals (current goals can now be found in the care plan section) Acute Rehab PT Goals PT Goal Formulation: With patient Time For Goal Achievement: 04/29/18 Potential to Achieve Goals: Good Progress towards PT goals: Progressing toward goals    Frequency    7X/week      PT Plan Current plan remains appropriate    Co-evaluation              AM-PAC PT "6 Clicks" Daily Activity  Outcome Measure  Difficulty turning over in bed (including adjusting bedclothes, sheets and blankets)?: Unable Difficulty moving from lying on back to sitting on the side of the bed? : Unable Difficulty sitting  down on and standing up from a chair with arms (e.g., wheelchair, bedside commode, etc,.)?: Unable Help needed moving to and from a bed to chair (including a wheelchair)?: A Lot Help needed walking in hospital room?: A Lot Help needed climbing 3-5 steps with a railing? : Total 6 Click Score: 8    End of Session Equipment Utilized During Treatment: Gait belt Activity Tolerance: Patient tolerated treatment well;Patient limited by fatigue Patient left: in chair;with call bell/phone within reach;with nursing/sitter in room Nurse Communication: Mobility status PT Visit Diagnosis: Other abnormalities of gait and mobility (R26.89);History of falling (Z91.81)     Time: 5110-2111 PT Time Calculation (min) (ACUTE ONLY): 30 min  Charges:  $Therapeutic Activity: 8-22 mins                     Lanney Gins, PT, DPT Supplemental Physical Therapist 04/28/18 10:59 AM Pager: [phone] Office: [phone]

## 2018-04-28 NOTE — Progress Notes (Signed)
Subjective: The patient is seen and examined today.  She is feeling fine with no concerning complaints.  She denied having any chest pain, shortness breath, cough or hemoptysis.  She has no fever but has occasional chills.  She has no nausea, vomiting, diarrhea or constipation.  She has no more bleeding issues.  Objective: Vital signs in last 24 hours: Temp:  [98.3 F (36.8 C)-99.1 F (37.3 C)] 98.3 F (36.8 C) (09/15 0443) Pulse Rate:  [82-96] 91 (09/15 0443) Resp:  [14-16] 16 (09/15 0443) BP: (116-147)/(59-135) 122/70 (09/15 0443) SpO2:  [96 %-100 %] 99 % (09/15 0443)  Intake/Output from previous day: 09/14 0701 - 09/15 0700 In: 4531.1 [P.O.:1680; I.V.:2662.2; IV Piggyback:188.9] Out: 5040 [Urine:5000; Drains:40] Intake/Output this shift: No intake/output data recorded.  General appearance: alert, cooperative and no distress Resp: clear to auscultation bilaterally Cardio: regular rate and rhythm, S1, S2 normal, no murmur, click, rub or gallop GI: soft, non-tender; bowel sounds normal; no masses,  no organomegaly Extremities: extremities normal, atraumatic, no cyanosis or edema  Lab Results:  Recent Labs    04/26/18 0359 04/27/18 0358  WBC 52.8* 41.6*  HGB 6.6* 9.3*  HCT 21.6* 29.8*  PLT 1,962* 1,778*   BMET Recent Labs    04/27/18 0358 04/28/18 0411  CREATININE 0.79 0.81    Studies/Results: No results found.  Medications: I have reviewed the patient's current medications.  Assessment/Plan: This is a very pleasant 77 years old African-American female with myeloproliferative disorder presented with polycythemia vera and probably essential thrombocythemia at this point.  She is status post treatment with hydroxyurea in the past.  This was discontinued recently She was a started on anagrelide for the last 2 days.  The patient is feeling fine and no concerning complaints.  She has no bleeding issues. CBC today as a still pending. We will continue to monitor the  patient closely and give further recommendation if she has any bleeding issues and also for adjusting her dose of anagrelide if needed.  Please call if you have any questions.  LOS: 5 days    Eilleen Kempf 04/28/2018

## 2018-04-28 NOTE — Progress Notes (Addendum)
Dr. Tommy Medal with Infectious disease notified by rn of concern for no order for PICC line. Md stated he would order this line today. RN also spoke with hematology as well about platelet levels as well. Dr. Delene Ruffini cleared pt to have picc line. This was communicated with Dr. Tommy Medal.

## 2018-04-28 NOTE — Progress Notes (Signed)
      INFECTIOUS DISEASE ATTENDING ADDENDUM:   Date: 04/28/2018  Patient name: Alejandra [name]  Medical record number: [id number]  Date of birth: [birth year]    Cultures are unrevealing (only growing normal fllora from skin)  PICC will be inserteed  Antibiotic plan is as follows  Note we will followup anerobic cultures till final [place].  OPAT Orders Discharge antibiotics: Per pharmacy protocolvancomycin and ceftazidime Aim for Vancomycin trough 15-20 (unless otherwise indicated) Duration: 6 weeks End Date: 06/06/2018  [facility] Care Per Protocol:  BIWEEKLY labs while on IV antibiotics:   __x_ BMP w GFR   weekly while on IV antibiotics: _x_ CBC with differential  _x_ CRP _x_ ESR _x_ Vancomycin trough  _x_ Please pull PIC at completion of IV antibiotics __ Please leave PIC in place until doctor has seen patient or been notified  Fax weekly labs to [phone]  Clinic Follow Up Appt: 05/28/2018  We will otherwise sign off for now.  Alcide Evener 04/28/2018, 1:43 PM

## 2018-04-28 NOTE — Progress Notes (Signed)
Spoke with patient's nurse, Anderson Malta, RN, and informed her that the PICC will be placed tomorrow.

## 2018-04-28 NOTE — Progress Notes (Signed)
Called PA, [facility], regarding temp of 101.6.  Ordered to give Tylenol.

## 2018-04-28 NOTE — Progress Notes (Signed)
Pt with moderate amount of blood oozing from right hip site (top and bottom). RN held pressure on site for several minutes (15 minutes) and was able to stop bleeding and reinforce dressing with several abd pads. Lonn Georgia NP aware of bleeding issue. She was also made aware of hemovac findings as well. Rn to call NP at end of shift to make decision about pulling hemovac.

## 2018-04-28 NOTE — Plan of Care (Signed)
Pt stable with no needs. No changes in pt overall pt condition. No changes needed in care plans.

## 2018-04-28 NOTE — Progress Notes (Signed)
Spoke with Dr. Tommy Medal from ID and was cleared to place a SL PICC for long term antibiotic treatment.

## 2018-04-28 NOTE — Progress Notes (Signed)
Alejandra [name] called concerning hemovac findings. Pt had a total of 170 out of hemovac today. Hemovac to stay in for now. rn also notified pa that picc will be placed tomorrow.

## 2018-04-28 NOTE — Progress Notes (Signed)
    Patient doing well, minimal pain about hip, sitting up in chair. She states she was up and ambulating yesterday and today. She continues to feel better after receiving 2U PRBC's. + passing gas, NL B/B function, denies SOB/dizziness/fatigue/N/V/D. Nurse in room and states pt had some oozing from where upper superficial drain fell out yesterday, pressure was applied and dressing re-done and bleeding has stopped. Pt remains comfortable.   Physical Exam: Vitals:   04/27/18 2327 04/28/18 0443  BP: 116/68 122/70  Pulse: 82 91  Resp: 15 16  Temp: 99.1 F (37.3 C) 98.3 F (36.8 C)  SpO2: 96% 99%   CBC Latest Ref Rng & Units 04/27/2018 04/26/2018 04/25/2018  WBC 4.0 - 10.5 K/uL 41.6(H) 52.8(HH) 50.5(HH)  Hemoglobin 12.0 - 15.0 g/dL 9.3(L) 6.6(LL) 8.2(L)  Hematocrit 36.0 - 46.0 % 29.8(L) 21.6(L) 27.0(L)  Platelets 150 - 400 K/uL 1,778(HH) 1,962(HH) 1,658(HH)    Dressing in place, reinforced, CDI currently. Pt sitting comfortably in chair NVI, drain in place, 2 nylon sutures. Output 40CC in 24hrs, scant over last 6hrs  POD #4 s/p I&D R hip with poly exchange post lat per Dr Shaune Spittle team. Complicated by polycythemia vera and infection, ID and hematology on board   - D/C drain, nurse to clip and remove in 1 hr as pt just had dressing change - Reinforce dressing PRN  - WBAT, cont PT/OT, Post THA precautions - DVT Prophylaxis: SCD's - Pain minimal and well controlled on current regimen - D/C to SNF pending progress  -ID on board, cont Vanc per their protocol              -Anticipate 6wks IV ABX then transition to 6wks PO ABX  - Pt to get PICC line today  - Hematology on board, Hg up to 9.6 post 2U transfused, likely hold on repeat dose ddvap pending Dr Gearldine Shown rec's. Todays labs just drawn pending results

## 2018-04-28 NOTE — Plan of Care (Signed)

## 2018-04-29 LAB — CBC WITH DIFFERENTIAL/PLATELET
BASOS ABS: 0 10*3/uL (ref 0.0–0.1)
BASOS PCT: 0 %
EOS PCT: 0 %
Eosinophils Absolute: 0 10*3/uL (ref 0.0–0.7)
HEMATOCRIT: 30.3 % — AB (ref 36.0–46.0)
HEMOGLOBIN: 9.4 g/dL — AB (ref 12.0–15.0)
LYMPHS PCT: 4 %
Lymphs Abs: 1.7 10*3/uL (ref 0.7–4.0)
MCH: 25.5 pg — ABNORMAL LOW (ref 26.0–34.0)
MCHC: 31 g/dL (ref 30.0–36.0)
MCV: 82.3 fL (ref 78.0–100.0)
MYELOCYTES: 2 %
Monocytes Absolute: 0.9 10*3/uL (ref 0.1–1.0)
Monocytes Relative: 2 %
NEUTROS PCT: 92 %
Neutro Abs: 40.9 10*3/uL — ABNORMAL HIGH (ref 1.7–7.7)
Platelets: 1712 10*3/uL (ref 150–400)
RBC: 3.68 MIL/uL — ABNORMAL LOW (ref 3.87–5.11)
RDW: 22.6 % — AB (ref 11.5–15.5)
WBC: 43.5 10*3/uL — ABNORMAL HIGH (ref 4.0–10.5)

## 2018-04-29 LAB — CREATININE, SERUM
CREATININE: 0.69 mg/dL (ref 0.44–1.00)
GFR calc Af Amer: >60 mL/min (ref >60)
GFR calc non Af Amer: >60 mL/min (ref >60)

## 2018-04-29 LAB — BASIC METABOLIC PANEL
Anion gap: 5 (ref 5–15)
BUN: 17 mg/dL (ref 8–23)
CHLORIDE: 104 mmol/L (ref 98–111)
CO2: 29 mmol/L (ref 22–32)
Calcium: 8.5 mg/dL — ABNORMAL LOW (ref 8.9–10.3)
Creatinine, Ser: 0.83 mg/dL (ref 0.44–1.00)
GFR calc Af Amer: >60 mL/min (ref >60)
GFR calc non Af Amer: >60 mL/min (ref >60)
GLUCOSE: 177 mg/dL — AB (ref 70–99)
POTASSIUM: 4.4 mmol/L (ref 3.5–5.1)
SODIUM: 138 mmol/L (ref 135–145)

## 2018-04-29 LAB — AEROBIC/ANAEROBIC CULTURE (SURGICAL/DEEP WOUND): CULTURE: NORMAL

## 2018-04-29 LAB — AEROBIC/ANAEROBIC CULTURE W GRAM STAIN (SURGICAL/DEEP WOUND): Gram Stain: NONE SEEN

## 2018-04-29 MED ORDER — SODIUM CHLORIDE 0.9% FLUSH
10.0000 mL | INTRAVENOUS | Status: DC | PRN
  Administered 2018-05-03: 20 mL
  Administered 2018-05-06: 10 mL
  Filled 2018-04-29 (×2): qty 40

## 2018-04-29 MED ORDER — SODIUM CHLORIDE 0.9% FLUSH
10.0000 mL | Freq: Two times a day (BID) | INTRAVENOUS | Status: DC
  Administered 2018-04-30 – 2018-05-03 (×4): 10 mL

## 2018-04-29 NOTE — Progress Notes (Signed)
Pt spiked a temp again of 102.8, P 112. Administered tylenol 650mg  PO. Notified on call provider for Dr. Mayer Camel. Received a call back from [facility]. She instructed to recheck temp in ~ 30 minutes if it has not gone back to normal then notify Infectious Disease and see what they want to do. Pt resting comfortably in bed, will continue to monitor pts vitals.

## 2018-04-29 NOTE — Care Management Important Message (Signed)
Important Message  Patient Details  Name: Alejandra [name] MRN: [id number] Date of Birth: 19-Feb-1941   Medicare Important Message Given:  Yes    Kerin Salen 04/29/2018, 12:27 [place] Message  Patient Details  Name: Alejandra [name] MRN: [id number] Date of Birth: 1941/01/18   Medicare Important Message Given:  Yes    Kerin Salen 04/29/2018, 12:27 PM

## 2018-04-29 NOTE — Progress Notes (Signed)
Physical Therapy Treatment Patient Details Name: Alejandra [name] MRN: [id number] DOB: October 21, 1940 Today's Date: 04/29/2018    History of Present Illness 77 yo female adm with  right hip wound dehisence, hematoma-->s/p  IRRIGATION AND DEBRIDEMENT EXTREMITY RIGHT HIP WITH POLY EXCHANGE; PMH: Polycythemia vera, HTN, multiple R hip surgeries, most recent revision in  June of 2019    PT Comments    POD # 5  Pt required + 2 Max Assist to get OOB just to recliner.  Pt present with involuntary jery movements with any movement.  Pt was unable to static sit still.  Present with increased involuntary jerky movements and was unable to functionally/safely attempt to take steps.  Pt did same thing prior admission when I had her and for days she was unable to safely move without these involuntary muscle spasms.  Reported to RN that when this happened before the cause was high K+.  Pt also felt warm to touch.  Temp 99.3 HR 122 at rest.   Follow Up Recommendations  SNF     Equipment Recommendations  None recommended by PT    Recommendations for Other Services       Precautions / Restrictions Precautions Precautions: Fall;Posterior Hip Precaution Comments: able to recall2/3 precautions - re-educated Restrictions Weight Bearing Restrictions: No Other Position/Activity Restrictions: WBAT    Mobility  Bed Mobility Overal bed mobility: Needs Assistance Bed Mobility: Supine to Sit     Supine to sit: Max assist     General bed mobility comments: use of bed pad; physical assist ot bring LE to EOB and for trunk righting.  Pt unable to support self sitting EOB present with involuntary jerky constant mvts.    Transfers Overall transfer level: Needs assistance Equipment used: Rolling walker (2 wheeled) Transfers: Sit to/from [facility] Sit to Stand: Max assist;+2 physical assistance;+2 safety/equipment Stand pivot transfers: Max assist;+2 physical assistance;+2 safety/equipment        General transfer comment: Max Assist + 2 transfer from elevated bed to recliner with great difficulty due to constant involuntary jerky movements.   Ambulation/Gait         Gait velocity: decreased    General Gait Details: unable due to increased involuntary jery movements   Stairs             Wheelchair Mobility    Modified Rankin (Stroke Patients Only)       Balance                                            Cognition Arousal/Alertness: Awake/alert Behavior During Therapy: WFL for tasks assessed/performed Overall Cognitive Status: Within Functional Limits for tasks assessed                                 General Comments: increased alertness but still sleepy (meds?)       Exercises      General Comments        Pertinent Vitals/Pain Pain Assessment: 0-10 Pain Score: 2  Pain Location: R hip Pain Descriptors / Indicators: Sore;Aching;Grimacing;Guarding;Operative site guarding Pain Intervention(s): Monitored during session;Repositioned;Premedicated before session;Ice applied    Home Living                      Prior Function  PT Goals (current goals can now be found in the care plan section) Progress towards PT goals: Progressing toward goals    Frequency    7X/week      PT Plan Current plan remains appropriate    Co-evaluation              AM-PAC PT "6 Clicks" Daily Activity  Outcome Measure  Difficulty turning over in bed (including adjusting bedclothes, sheets and blankets)?: Unable Difficulty moving from lying on back to sitting on the side of the bed? : Unable Difficulty sitting down on and standing up from a chair with arms (e.g., wheelchair, bedside commode, etc,.)?: Unable Help needed moving to and from a bed to chair (including a wheelchair)?: Total Help needed walking in hospital room?: Total Help needed climbing 3-5 steps with a railing? : Total 6 Click Score:  6    End of Session Equipment Utilized During Treatment: Gait belt Activity Tolerance: Patient tolerated treatment well;Patient limited by fatigue Patient left: in chair;with call bell/phone within reach;with nursing/sitter in room Nurse Communication: Mobility status PT Visit Diagnosis: Other abnormalities of gait and mobility (R26.89);History of falling (Z91.81)     Time: 7340-3709 PT Time Calculation (min) (ACUTE ONLY): 25 min  Charges:  $Therapeutic Activity: 23-37 mins                     Rica Koyanagi  PTA Acute  Rehabilitation Services Pager      [phone] Office      [phone]

## 2018-04-29 NOTE — Progress Notes (Signed)
[name]  MRN: [id number]  DOB/AGE:  78/13/1942 / 78 y.o.  5 Days Post-Op Procedure(s) (LRB): IRRIGATION AND DEBRIDEMENT EXTREMITY RIGHT HIP WITH POLY EXCHANGE POSTERIOR LATERAL POSITION,stem and head exchange (Right)    PROGRESS NOTE Subjective: Patient is alert, oriented, no Nausea, no Vomiting, yes passing gas, . Taking PO well. Denies SOB, Chest or Calf Pain. Using Incentive Spirometer, PAS in place. Ambulate WBAT Patient reports pain as mild.    Objective: Vital signs in last 24 hours: Vitals:   04/28/18 1300 04/28/18 2106 04/29/18 0001 04/29/18 0512  BP: 120/67 133/76  134/73  Pulse: (!) 102 (!) 104  100  Resp: 15 20  18   Temp: 98.7 F (37.1 C) (!) 101.6 F (38.7 C) 98.7 F (37.1 C) 99.8 F (37.7 C)  TempSrc: Oral Oral Oral Oral  SpO2: 100% 98%  99%  Weight:      Height:          Intake/Output from previous day: I/O last 3 completed shifts: In: 4321.2 [P.O.:1620; I.V.:2122.5; IV Piggyback:578.7] Out: 0347 [Urine:4900; Drains:230]   Intake/Output this shift: No intake/output data recorded.   LABORATORY DATA: Recent Labs    04/27/18 0358 04/28/18 0411 04/28/18 0816 04/29/18 0339  WBC 41.6*  --   --  43.5*  HGB 9.3*  --   --  9.4*  HCT 29.8*  --   --  30.3*  PLT 1,778*  --   --  1,712*  CREATININE 0.79 0.81  --  0.69  INR  --   --  1.50  --     Examination: Neurologically intact Neurovascular intact Sensation intact distally Intact pulses distally Dorsiflexion/Plantar flexion intact Incision: moderate drainage and dressing was changed yesterday due to drainage.  currently dry. No cellulitis present Compartment soft} XR AP&Lat of hip shows well placed\fixed THA  Assessment:   5 Days Post-Op Procedure(s) (LRB): IRRIGATION AND DEBRIDEMENT EXTREMITY RIGHT HIP WITH POLY EXCHANGE POSTERIOR LATERAL POSITION,stem and head exchange (Right) ADDITIONAL DIAGNOSIS:  Expected Acute Blood Loss Anemia,  Expected Acute Blood Loss Anemia, Polycythemia  vera, hx of AKI, Hx UTI   Status post-revision right total up arthroplasty with postoperative hematoma and UTI.  Irrigation debridement yesterday revealed a lot of blood and inflamed tissue.  Unfortunately, Gram stain and culture thus far is negative and the Gram stain and culture from 2 weeks ago, also negative.   Plan: -  Wound care nurse contacted and she will pull drain and place external wound vac.   - WBAT, cont PT/OT, Posterior THA precautions - DVT Prophylaxis: SCD's - Pain minimal and well controlled on current regimen - D/C to SNF possibly tomorrow pending progress  -ID on board, cont Vanc per their protocol  -Anticipate 6wks IV ABX then transition to 6wks PO ABX             - Pt to get PICC line  - Appreciate continued input from Dr. Benay Spice for continued management of Polycythemia Vera and clotting issues.

## 2018-04-29 NOTE — Plan of Care (Signed)
  Problem: Clinical Measurements: Goal: Ability to maintain clinical measurements within normal limits will improve Outcome: Progressing   Problem: Clinical Measurements: Goal: Will remain free from infection Outcome: Progressing   Problem: Clinical Measurements: Goal: Diagnostic test results will improve Outcome: Progressing   

## 2018-04-29 NOTE — Progress Notes (Signed)
IP PROGRESS NOTE  Subjective:   Alejandra [name] has no complaint.  She had a fever last night.   Objective: Vital signs in last 24 hours: Blood pressure 134/73, pulse 100, temperature 99.8 F (37.7 C), temperature source Oral, resp. rate 18, height 5\' 4"  (1.626 m), weight 115 lb (52.2 kg), SpO2 99 %.  Intake/Output from previous day: 09/15 0701 - 09/16 0700 In: 2654.3 [P.O.:1440; I.V.:724.4; IV Piggyback:489.9] Out: 2910 [Urine:2700; Drains:210]  Physical Exam:   Extremities: No leg edema Musculoskeletal: Dressing at the right thigh wound with a small amount of blood coating the superior aspect of the gauze dressing, small amount of blood in the remaining drain   Lab Results: Recent Labs    04/27/18 0358 04/29/18 0339  WBC 41.6* 43.5*  HGB 9.3* 9.4*  HCT 29.8* 30.3*  PLT 1,778* 1,712*    9/15 PT 17.9 BMET Recent Labs    04/28/18 0411 04/29/18 0339  CREATININE 0.81 0.69     Studies/Results: [place] Ekg Site Rite  Result Date: 04/28/2018 If Site Rite image not attached, placement could not be confirmed due to current cardiac rhythm.   Medications: I have reviewed the patient's current medications.  Assessment/Plan: 1. Polycythemia vera on hydroxyurea. Last phlebotomy 09/08/2015  Hydroxyurea dose increased to 1500 mg daily 06/26/2016  Hydroxyurea changed to 1500 mg daily on Monday through Friday, 1000 mg on Saturday and Sunday beginning 09/11/2016  Hydroxyurea changed to 1500 mg Monday Wednesday and Friday and 1000 mg other days beginning 10/10/2016  Hydroxyurea changed to 1000 mg daily beginning 10/31/2016  Hydroxyurea placed on hold beginning 11/27/2016-she misunderstood our instructions and continued 1000 mg daily.  Hydroxyurea dose decreased to 500 mg daily beginning 01/01/2017  Hydroxyurea dose increased 06/05/2017  Hydroxyurea dose increased to 1000 mg daily except for 500 mg on Monday, Wednesday, and Friday, 11/01/2017  Hydroxyurea discontinued  04/25/2018  Anagrelide started 04/25/2018 2. Hypertension, followed by Dr. Montez Morita.  3. History of hyperpigmentation at the hands. Question related to hydroxyurea. 4. Hip fracture February 2013. She underwent a bipolar hip arthroplasty on 04/23/2012. 5. Thrombocytosis. Most likely secondary to polycythemia vera and iron deficiency. 6. Hospitalization with left shoulder cellulitis February 2016. 7. Pelvic fracture after a fall 05/31/2015 8. Weight loss-etiology unclear; weight stable 12/16/2015 and06/15/2017 9. Right anterior buccal ulcer 03/09/2016 10. Revision of right hip arthroplasty 0/25/4270, complicated by intraoperative and postoperative bleeding requiring multiple red cell transfusions 11. Swelling at the right hip surgical site week of 04/08/2018- 220 cc bloody fluid aspirated from the right hip surgical site 04/11/2018  Right thigh wound dehiscence prompting readmission 04/23/2018  CT 04/23/2018- gas/fluid collection at the lateral right hip with area of focal cortical destruction concerning for infection  Irrigation/debridement and replacement of right hip hardware 04/24/2018 12.  Coagulopathy 13.  Anemia secondary to surgical blood loss  Ms. Mcneeley appears stable.  The hemoglobin is stable.  She was last transfused on 04/26/2018.  She has a persistent mild coagulopathy despite vitamin K replacement.  There is no indication for transfusion today.  We will continue the anagrelide.  We will schedule outpatient follow-up in the hematology clinic.    Recommendations: 1.  Continue anagrelide, will increase the anagrelide dose if the platelet count is not lower on 05/01/2018 2.  CBC in 05/01/2018 3.  Antibiotics per infectious disease 4.  Work-up for fever per orthopedics     LOS: 6 days   Betsy Coder, MD   04/29/2018, 7:12 AM

## 2018-04-29 NOTE — Progress Notes (Signed)
Patient not medically stable to transfer to SNF today.  CSW updated SNF.  -Patient to get PICC line. -external Wound Vac is placed.   CSW will continue to follow discharge needs.   Kathrin Greathouse, Marlinda Mike, MSW Clinical Social Worker  [phone] 04/29/2018  3:07 PM

## 2018-04-29 NOTE — Consult Note (Addendum)
[place] Nurse wound consult note Reason for Consult: Consult requested by ortho service, discussed plan of care via phone call, to discontinue drains at post-op wound site and apply Vac dressing.  Removed sutures and 2 Hemovacs; pt was medicated for pain prior to the procedure and tolerated with minimal amt discomfort. Wound type: Right hip with post-op wound.  2 full thickness wounds where drains were removed, too narrow to pack.  Scant amt bloody drainage. Incision site well approximated except for a location in the middle; .5X.5X3cm; too narrow to visualize but it appears to be red and moist. with mod amt bloody drainage when swab inserted for assessment.  Applied 2 pieces black foam to 133mm cont suction; one to tunneling area and another over the incision line.  Pt tolerated with mod amt discomfort after meds given, small amt bloody drainage in the suction cannister.  Periwound: Intact skin surrounding wound Dressing procedure/placement/frequency: WOC will change Vac dressing Q M/W/F while the patient is in the hospital. Julien Girt MSN, RN, [place], [place], [place]

## 2018-04-29 NOTE — Progress Notes (Signed)
Peripherally Inserted Central Catheter/Midline Placement  The IV Nurse has discussed with the patient and/or persons authorized to consent for the patient, the purpose of this procedure and the potential benefits and risks involved with this procedure.  The benefits include less needle sticks, lab draws from the catheter, and the patient may be discharged home with the catheter. Risks include, but not limited to, infection, bleeding, blood clot (thrombus formation), and puncture of an artery; nerve damage and irregular heartbeat and possibility to perform a PICC exchange if needed/ordered by physician.  Alternatives to this procedure were also discussed.  B[employer] Power PICC patient education guide, fact sheet on infection prevention and patient information card has been provided to patient /or left at bedside.    PICC/Midline Placement Documentation        Alejandra [name] 04/29/2018, 5:13 PM

## 2018-04-30 LAB — CBC WITH DIFFERENTIAL/PLATELET
BASOS PCT: 0 %
Band Neutrophils: 0 %
Basophils Absolute: 0 10*3/uL (ref 0.0–0.1)
Blasts: 0 %
Eosinophils Absolute: 0.6 10*3/uL (ref 0.0–0.7)
Eosinophils Relative: 2 %
HCT: 27.8 % — ABNORMAL LOW (ref 36.0–46.0)
Hemoglobin: 8.5 g/dL — ABNORMAL LOW (ref 12.0–15.0)
LYMPHS PCT: 2 %
Lymphs Abs: 0.6 10*3/uL — ABNORMAL LOW (ref 0.7–4.0)
MCH: 25.1 pg — AB (ref 26.0–34.0)
MCHC: 30.6 g/dL (ref 30.0–36.0)
MCV: 82.2 fL (ref 78.0–100.0)
MYELOCYTES: 4 %
Metamyelocytes Relative: 0 %
Monocytes Absolute: 0.6 10*3/uL (ref 0.1–1.0)
Monocytes Relative: 2 %
NEUTROS PCT: 90 %
NRBC: 0 /100{WBCs}
Neutro Abs: 27.7 10*3/uL — ABNORMAL HIGH (ref 1.7–7.7)
OTHER: 0 %
PLATELETS: 1204 10*3/uL — AB (ref 150–400)
PROMYELOCYTES RELATIVE: 0 %
RBC: 3.38 MIL/uL — AB (ref 3.87–5.11)
RDW: 23 % — ABNORMAL HIGH (ref 11.5–15.5)
WBC: 29.5 10*3/uL — AB (ref 4.0–10.5)

## 2018-04-30 LAB — CREATININE, SERUM
CREATININE: 0.9 mg/dL (ref 0.44–1.00)
GFR calc Af Amer: >60 mL/min (ref >60)
GFR, EST NON AFRICAN AMERICAN: 60 mL/min — AB (ref >60)

## 2018-04-30 MED ORDER — IBUPROFEN 400 MG PO TABS
400.0000 mg | ORAL_TABLET | Freq: Once | ORAL | Status: AC
  Administered 2018-04-30: 400 mg via ORAL
  Filled 2018-04-30: qty 1

## 2018-04-30 NOTE — Progress Notes (Signed)
Pts rt hip wound vac kept popping up with an error message of blockage. Flushed the tubing, made sure the tubing was not kinked or closed off and was not being restricted in any way, but the error message kept coming back and wound vac was not cycling at 174mm hg. Went ahead and changed entire wound vac dressing and canister. Restarted wound vac and it is now cycling at 143mm hg.

## 2018-04-30 NOTE — Consult Note (Signed)
   [facility] CM Inpatient Consult   04/30/2018  HERMAN MELL [birth year] [id number]    Patient screened for potential [facility] Care Management due unplanned readmission risk score of 22% (high) and multiple hospitalizations.   Chart reviewed. Noted discharge plan is for SNF. No identifiable [facility] Care Management needs at that time.    Marthenia Rolling, MSN-Ed, RN,BSN [facility] Liaison [phone]

## 2018-04-30 NOTE — Progress Notes (Signed)
Physical Therapy Treatment Patient Details Name: Alejandra [name] MRN: [id number] DOB: 1940/09/29 Today's Date: 04/30/2018    History of Present Illness 77 yo female adm with  right hip wound dehisence, hematoma-->s/p  IRRIGATION AND DEBRIDEMENT EXTREMITY RIGHT HIP WITH POLY EXCHANGE; PMH: Polycythemia vera, HTN, multiple R hip surgeries, most recent revision in  June of 2019    PT Comments    Pt with unsteadiness with all mobility today, requiring external support through PT and RW use. Pt ambulated 12 ft with multiple standing rest breaks and chair follow. Pt also noted some dizziness, which she called "vertigo", upon sitting up. Pt with continued jerky UE and LE movements with mobility, which presents as bilateral knee buckling in standing. PT guarded closely in case of LOB. PT to progress mobility as able, will continue to follow.   Follow Up Recommendations  SNF;Supervision for mobility/OOB     Equipment Recommendations  None recommended by PT    Recommendations for Other Services       Precautions / Restrictions Precautions Precautions: Fall;Posterior Hip Precaution Comments: able to recall 2/3 precautions, missing IR precaution- re-educated Restrictions Weight Bearing Restrictions: No Other Position/Activity Restrictions: WBAT    Mobility  Bed Mobility Overal bed mobility: Needs Assistance Bed Mobility: Supine to Sit     Supine to sit: Max assist;HOB elevated     General bed mobility comments: Max assist for bilat LE management R>L, scooting to EOB, and trunk elevation. Pt propped on UEs to balance on EOB, limited tolerance for sitting EOB due to fatigue and required rest by posterior leaning/resting on L forearm. Involuntary jerky movements of UE and LEs present this session.   Transfers Overall transfer level: Needs assistance Equipment used: Rolling walker (2 wheeled) Transfers: Sit to/from Stand   Stand pivot transfers: Mod assist;From elevated surface        General transfer comment: Mod assist for trunk elevation and steadying. PT in posterolateral position for guarding, due to jerky movements especially of LEs in WB.   Ambulation/Gait Ambulation/Gait assistance: Mod assist;+2 safety/equipment Gait Distance (Feet): 12 Feet Assistive device: Rolling walker (2 wheeled) Gait Pattern/deviations: Step-to pattern;Decreased stride length;Decreased weight shift to right;Decreased stance time - right Gait velocity: decreased    General Gait Details: Pt with periods of involuntary jerking of LEs, looked similar to LE buckling. PT guarding posteriorly in case of LOB. Pt followed hip precautions during ambulation, min verbal cuing for reminders. Pt requiring standing rest breaks throughout, verbal cuing for placement in RW.   Stairs             Wheelchair Mobility    Modified Rankin (Stroke Patients Only)       Balance Overall balance assessment: History of Falls;Needs assistance Sitting-balance support: Bilateral upper extremity supported;Feet supported Sitting balance-Leahy Scale: Poor Sitting balance - Comments: Pt requires UE support to sit upright. Verbal and tactile cuing provided to right posture from posterior lean tendency Postural control: Posterior lean Standing balance support: Bilateral upper extremity supported Standing balance-Leahy Scale: Poor Standing balance comment: relies on RW and external assist                            Cognition Arousal/Alertness: Awake/alert Behavior During Therapy: WFL for tasks assessed/performed Overall Cognitive Status: Impaired/Different from baseline Area of Impairment: Problem solving;Safety/judgement                       Following Commands: Follows one  step commands with increased time;Follows multi-step commands with increased time     Problem Solving: Difficulty sequencing;Requires verbal cues;Requires tactile cues        Exercises Total Joint  Exercises Long Arc Quad: AROM;Right;10 reps;Seated    General Comments        Pertinent Vitals/Pain Pain Assessment: 0-10 Pain Score: 0-No pain Pain Location: R hip    Home Living                      Prior Function            PT Goals (current goals can now be found in the care plan section) Acute Rehab PT Goals PT Goal Formulation: With patient Time For Goal Achievement: 05/06/18 Potential to Achieve Goals: Fair Progress towards PT goals: Progressing toward goals    Frequency    7X/week      PT Plan Current plan remains appropriate    Co-evaluation              AM-PAC PT "6 Clicks" Daily Activity  Outcome Measure  Difficulty turning over in bed (including adjusting bedclothes, sheets and blankets)?: Unable Difficulty moving from lying on back to sitting on the side of the bed? : Unable Difficulty sitting down on and standing up from a chair with arms (e.g., wheelchair, bedside commode, etc,.)?: Unable Help needed moving to and from a bed to chair (including a wheelchair)?: A Lot Help needed walking in hospital room?: A Lot Help needed climbing 3-5 steps with a railing? : Total 6 Click Score: 8    End of Session Equipment Utilized During Treatment: Gait belt Activity Tolerance: Patient tolerated treatment well;Patient limited by fatigue Patient left: in chair;with call bell/phone within reach;with nursing/sitter in room;with chair alarm set Nurse Communication: Mobility status PT Visit Diagnosis: Other abnormalities of gait and mobility (R26.89);History of falling (Z91.81)     Time: 9024-0973 PT Time Calculation (min) (ACUTE ONLY): 33 min  Charges:  $Gait Training: 8-22 mins $Therapeutic Activity: 8-22 mins                     Julien Girt, PT Acute Rehabilitation Services Pager [phone]  Office [phone]    [place] 04/30/2018, 2:18 PM

## 2018-04-30 NOTE — Progress Notes (Signed)
Pharmacy Antibiotic Note  Alejandra [name] is a 77 y.o. female admitted on 04/23/2018 with wound dehiscence R THA s/p I&D.  Pharmacy has been consulted for vancomycin and ceftazidime dosing.  Plan:  Continue ceftazidime 2g IV q12h for CrCl < 50 ml/min  Continue vancomycin 1250 mg IV q48hVancomycin levels at steady state, goal AUC 400-500  Follow up renal function & cultures  Height: 5\' 4"  (162.6 cm) Weight: 115 lb (52.2 kg) IBW/kg (Calculated) : 54.7  Temp (24hrs), Avg:100.4 F (38 C), Min:98.4 F (36.9 C), Max:102.9 F (39.4 C)  Recent Labs  Lab 04/25/18 0450 04/26/18 0359 04/27/18 0358 04/28/18 0411 04/29/18 0339 04/29/18 1637 04/30/18 0508 04/30/18 0900  WBC 50.5* 52.8* 41.6*  --  43.5*  --   --  29.5*  CREATININE 1.02* 0.93 0.79 0.81 0.69 0.83 0.90  --     Estimated Creatinine Clearance: 42.5 mL/min (by C-G formula based on SCr of 0.9 mg/dL).    Not on File  Antimicrobials this admission:  9/11 Cefazolin x 1 9/11 Ceftazidime >> 9/11 Vancomycin >>  Dose adjustments this admission:    Microbiology results:  9/11 MRSA PCR: neg 9/11 UCx: NGF 9/11 R hip wound tissue: normal skin flora   Thank you for allowing pharmacy to be a part of this patient's care.   Royetta Asal, PharmD, BCPS Pager [phone] 04/30/2018 10:20 AM

## 2018-04-30 NOTE — Progress Notes (Signed)
Pts temp only came down to 102.3 after receiving 650 mg of tylenol. Notified on-call I&D MD. Received a call back from Dr. Talbot Grumbling. New orders for Ibuprofen 400 mg x1 now. I&D physician siad he does not know what else to do for pt besides what is already being done. Notified RRT and made them aware of pts situation. Ice packs placed in pts armpits and groin as well as on right hip. Will continue to monitor pts VS. Pt resting comfortably in bed, not in any distress at this time.

## 2018-04-30 NOTE — Progress Notes (Signed)
See rapid response note.  

## 2018-04-30 NOTE — Consult Note (Signed)
WOC follow-up: Vac intact with good seal, mod amt old bloody drainage in the cannister.  Plan for dressing change tomorrow. If this continues, it might cause the suction cannister to clog and loose suction, and the Vac might not be a viable option if that occurs. Julien Girt MSN, RN, [place], [place], [place]

## 2018-04-30 NOTE — Significant Event (Addendum)
Rapid Response Event Note  Overview: Time Called: 0020 Arrival Time: 0025 Event Type: Other (Comment)(Fever)  Initial Focused Assessment: Patient awake, alert lying in bed. Warm to touch. BP 123/67, HR 115 and appears to be sinus tachycardia on monitor. Per RN patients current fever is 102.3 oral. RN gave Tylenol at 2300 and received a one time order for Motrin, which RN gave at [place].   Interventions: Ice packs applied to axilla and groin. Blanket removed.  Motrin given, per MD order.  Plan of Care (if not transferred): RN to update rapid response RN of patient's follow up temperature check.   Update: follow-up temperature 99.2F at 0130. No further intervention at this time.  Event Summary:  Alejandra [name]

## 2018-04-30 NOTE — Progress Notes (Signed)
[name]  MRN: [id number]  DOB/AGE:  1940/09/06 / 77 y.o.  6 Days Post-Op Procedure(s) (LRB): IRRIGATION AND DEBRIDEMENT EXTREMITY RIGHT HIP WITH POLY EXCHANGE POSTERIOR LATERAL POSITION,stem and head exchange (Right)    PROGRESS NOTE Subjective: Patient is alert, oriented, no Nausea, no Vomiting, yes passing gas, . Taking PO well. Denies SOB, Chest or Calf Pain. Using Incentive Spirometer, PAS in place. Ambulate WBAT, posterior hip precautions Patient reports pain as  5/10  .   Pt spiked fever of 102 over night, but this was relieved with tylenol and ibuprofen.   Objective: Vital signs in last 24 hours: Vitals:   04/30/18 0024 04/30/18 0127 04/30/18 0516 04/30/18 0910  BP: 123/67 122/65 (!) 154/67 105/65  Pulse: (!) 115 (!) 103 80 79  Resp: 20 20 20    Temp:  99.6 F (37.6 C) 98.5 F (36.9 C) 98.4 F (36.9 C)  TempSrc:  Oral Oral Oral  SpO2:  99% 100% 100%  Weight:      Height:          Intake/Output from previous day: I/O last 3 completed shifts: In: 1541.9 [P.O.:900; I.V.:331.9; IV Piggyback:310] Out: 1900 [Urine:1850; Drains:50]   Intake/Output this shift: No intake/output data recorded.   LABORATORY DATA: Recent Labs    04/28/18 0816 04/29/18 0339 04/29/18 1637 04/30/18 0508 04/30/18 0900  WBC  --  43.5*  --   --  29.5*  HGB  --  9.4*  --   --  8.5*  HCT  --  30.3*  --   --  27.8*  PLT  --  1,712*  --   --  1,204*  NA  --   --  138  --   --   K  --   --  4.4  --   --   CL  --   --  104  --   --   CO2  --   --  29  --   --   BUN  --   --  17  --   --   CREATININE  --  0.69 0.83 0.90  --   GLUCOSE  --   --  177*  --   --   INR 1.50  --   --   --   --   CALCIUM  --   --  8.5*  --   --     Examination: Neurologically intact Neurovascular intact Sensation intact distally Intact pulses distally Dorsiflexion/Plantar flexion intact Incision: moderate drainage and wound vac in place and appears to have around 150cc in canister No  cellulitis present Compartment soft} XR AP&Lat of hip shows well placed\fixed THA  Assessment:   6 Days Post-Op Procedure(s) (LRB): IRRIGATION AND DEBRIDEMENT EXTREMITY RIGHT HIP WITH POLY EXCHANGE POSTERIOR LATERAL POSITION,stem and head exchange (Right) ADDITIONAL DIAGNOSIS:  Expected Acute Blood Loss Anemia, Expected Acute Blood Loss Anemia, Polycythemia vera, hx of AKI, Hx UTI  Plan: - WBAT, cont PT/OT, Posterior THA precautions - DVT Prophylaxis: SCD's - Pain minimal and well controlled on current regimen - D/C to SNF possibly tomorrow pending progress  -ID on board, cont Vanc per their protocol  -Anticipate 6wks IV ABX then transition to 6wks PO ABX - Pt to get PICC line  - Appreciate continued input from Dr. Benay Spice for continued management of Polycythemia Vera and clotting issues.

## 2018-05-01 ENCOUNTER — Inpatient Hospital Stay (HOSPITAL_COMMUNITY): Payer: Medicare HMO

## 2018-05-01 LAB — CREATININE, SERUM: CREATININE: 0.89 mg/dL (ref 0.44–1.00)

## 2018-05-01 LAB — CBC WITH DIFFERENTIAL/PLATELET
BAND NEUTROPHILS: 0 %
BLASTS: 0 %
Basophils Absolute: 0 10*3/uL (ref 0.0–0.1)
Basophils Relative: 0 %
Eosinophils Absolute: 0.5 10*3/uL (ref 0.0–0.7)
Eosinophils Relative: 2 %
HCT: 26 % — ABNORMAL LOW (ref 36.0–46.0)
HEMOGLOBIN: 8 g/dL — AB (ref 12.0–15.0)
LYMPHS PCT: 1 %
Lymphs Abs: 0.3 10*3/uL — ABNORMAL LOW (ref 0.7–4.0)
MCH: 25.2 pg — ABNORMAL LOW (ref 26.0–34.0)
MCHC: 30.8 g/dL (ref 30.0–36.0)
MCV: 81.8 fL (ref 78.0–100.0)
MYELOCYTES: 1 %
Metamyelocytes Relative: 0 %
Monocytes Absolute: 0.5 10*3/uL (ref 0.1–1.0)
Monocytes Relative: 2 %
NEUTROS PCT: 94 %
Neutro Abs: 23.8 10*3/uL — ABNORMAL HIGH (ref 1.7–7.7)
OTHER: 0 %
PLATELETS: 1094 10*3/uL — AB (ref 150–400)
PROMYELOCYTES RELATIVE: 0 %
RBC: 3.18 MIL/uL — ABNORMAL LOW (ref 3.87–5.11)
RDW: 23.5 % — ABNORMAL HIGH (ref 11.5–15.5)
WBC: 25.1 10*3/uL — ABNORMAL HIGH (ref 4.0–10.5)
nRBC: 0 /100 WBC

## 2018-05-01 LAB — URINALYSIS, COMPLETE (UACMP) WITH MICROSCOPIC
Bacteria, UA: NONE SEEN
Bilirubin Urine: NEGATIVE
GLUCOSE, UA: NEGATIVE mg/dL
HGB URINE DIPSTICK: NEGATIVE
KETONES UR: NEGATIVE mg/dL
LEUKOCYTES UA: NEGATIVE
NITRITE: NEGATIVE
PH: 6 (ref 5.0–8.0)
Protein, ur: NEGATIVE mg/dL
Specific Gravity, Urine: 1.011 (ref 1.005–1.030)

## 2018-05-01 LAB — PREPARE RBC (CROSSMATCH)

## 2018-05-01 MED ORDER — SODIUM CHLORIDE 0.9% IV SOLUTION
Freq: Once | INTRAVENOUS | Status: AC
  Administered 2018-05-01: 15:00:00 via INTRAVENOUS

## 2018-05-01 NOTE — Progress Notes (Signed)
IP PROGRESS NOTE  Subjective:   Alejandra [name] has a cough, no dyspnea.  No apparent bleeding.   Objective: Vital signs in last 24 hours: Blood pressure 118/70, pulse 100, temperature 99.3 F (37.4 C), temperature source Oral, resp. rate 19, height 5\' 4"  (1.626 m), weight 115 lb (52.2 kg), SpO2 95 %.  Intake/Output from previous day: 09/17 0701 - 09/18 0700 In: 1942.6 [P.O.:960; I.V.:180.2; IV Piggyback:752.4] Out: 1540 [Urine:1050; Drains:490]  Physical Exam: Cardiac: Regular rate and rhythm Lungs: Clear anteriorly-no respiratory distress  Extremities: No leg edema Musculoskeletal: Wound VAC at the right thigh, small amount of bloody drainage in the tubing and collection canister  Lab Results: Recent Labs    04/29/18 0339 04/30/18 0900  WBC 43.5* 29.5*  HGB 9.4* 8.5*  HCT 30.3* 27.8*  PLT 1,712* 1,204*    9/15 PT 17.9 BMET Recent Labs    04/29/18 1637 04/30/18 0508 05/01/18 0420  NA 138  --   --   K 4.4  --   --   CL 104  --   --   CO2 29  --   --   GLUCOSE 177*  --   --   BUN 17  --   --   CREATININE 0.83 0.90 0.89  CALCIUM 8.5*  --   --       Medications: I have reviewed the patient's current medications.  Assessment/Plan: 1. Polycythemia vera on hydroxyurea. Last phlebotomy 09/08/2015  Hydroxyurea dose increased to 1500 mg daily 06/26/2016  Hydroxyurea changed to 1500 mg daily on Monday through Friday, 1000 mg on Saturday and Sunday beginning 09/11/2016  Hydroxyurea changed to 1500 mg Monday Wednesday and Friday and 1000 mg other days beginning 10/10/2016  Hydroxyurea changed to 1000 mg daily beginning 10/31/2016  Hydroxyurea placed on hold beginning 11/27/2016-she misunderstood our instructions and continued 1000 mg daily.  Hydroxyurea dose decreased to 500 mg daily beginning 01/01/2017  Hydroxyurea dose increased 06/05/2017  Hydroxyurea dose increased to 1000 mg daily except for 500 mg on Monday, Wednesday, and Friday, 11/01/2017  Hydroxyurea  discontinued 04/25/2018  Anagrelide started 04/25/2018 2. Hypertension, followed by Dr. Montez Morita.  3. History of hyperpigmentation at the hands. Question related to hydroxyurea. 4. Hip fracture February 2013. She underwent a bipolar hip arthroplasty on 04/23/2012. 5. Thrombocytosis. Most likely secondary to polycythemia vera and iron deficiency. 6. Hospitalization with left shoulder cellulitis February 2016. 7. Pelvic fracture after a fall 05/31/2015 8. Weight loss-etiology unclear; weight stable 12/16/2015 and06/15/2017 9. Right anterior buccal ulcer 03/09/2016 10. Revision of right hip arthroplasty 7/41/2878, complicated by intraoperative and postoperative bleeding requiring multiple red cell transfusions 11. Swelling at the right hip surgical site week of 04/08/2018- 220 cc bloody fluid aspirated from the right hip surgical site 04/11/2018  Right thigh wound dehiscence prompting readmission 04/23/2018  CT 04/23/2018- gas/fluid collection at the lateral right hip with area of focal cortical destruction concerning for infection  Irrigation/debridement and replacement of right hip hardware 04/24/2018 12.  Coagulopathy 13.  Anemia secondary to surgical blood loss  Alejandra [name] appears stable.  The hemoglobin is slightly lower today, but she does not appear to have excessive bleeding and has not required further transfusions.  The platelet count is lower since starting anagrelide last week.  She had a fever last night.  She complains of a cough.  She is on antibiotics.  I will check a chest x-ray and urinalysis.   Recommendations: 1.  Continue anagrelide, check platelet count again 05/02/2018 2.  Ambulation, physical  therapy per orthopedics 3.  Chest x-ray and urinalysis to evaluate the fever     LOS: 8 days   Betsy Coder, MD   05/01/2018, 6:55 AM

## 2018-05-01 NOTE — Progress Notes (Signed)
[name]  MRN: [id number]  DOB/AGE:  Sep 01, 1940 / 77 y.o.  7 Days Post-Op Procedure(s) (LRB): IRRIGATION AND DEBRIDEMENT EXTREMITY RIGHT HIP WITH POLY EXCHANGE POSTERIOR LATERAL POSITION,stem and head exchange (Right)    PROGRESS NOTE Subjective: Patient is alert, oriented, no Nausea, no Vomiting, yes passing gas, . Taking PO well. Denies SOB, Chest or Calf Pain. Using Incentive Spirometer, PAS in place. Ambulate WBAT with pt walking 12 ft with therapy Patient reports pain as  5/10  .    Objective: Vital signs in last 24 hours: Vitals:   04/30/18 1416 04/30/18 2219 05/01/18 0207 05/01/18 0615  BP: 121/62 (!) 108/59 108/67 118/70  Pulse: (!) 101 97 90 100  Resp:  18 18 19   Temp: 98.5 F (36.9 C) 99.9 F (37.7 C) 98.8 F (37.1 C) 99.3 F (37.4 C)  TempSrc: Oral Oral Oral Oral  SpO2: 99% 96% 96% 95%  Weight:      Height:          Intake/Output from previous day: I/O last 3 completed shifts: In: 2682.9 [P.O.:1380; I.V.:290.4; Other:50; IV Piggyback:962.5] Out: 2190 [Urine:1700; Drains:490]   Intake/Output this shift: Total I/O In: 139.8 [I.V.:39.8; IV Piggyback:100] Out: 400 [Urine:400]   LABORATORY DATA: Recent Labs    04/29/18 1637 04/30/18 0508 04/30/18 0900 05/01/18 0420 05/01/18 0800  WBC  --   --  29.5*  --  25.1*  HGB  --   --  8.5*  --  8.0*  HCT  --   --  27.8*  --  26.0*  PLT  --   --  1,204*  --  1,094*  NA 138  --   --   --   --   K 4.4  --   --   --   --   CL 104  --   --   --   --   CO2 29  --   --   --   --   BUN 17  --   --   --   --   CREATININE 0.83 0.90  --  0.89  --   GLUCOSE 177*  --   --   --   --   CALCIUM 8.5*  --   --   --   --     Examination: Neurologically intact Neurovascular intact Sensation intact distally Intact pulses distally Dorsiflexion/Plantar flexion intact Incision: dressing C/D/I No cellulitis present Compartment soft}   Wound vac in place and is working appropriately.  Assessment:   7 Days  Post-Op Procedure(s) (LRB): IRRIGATION AND DEBRIDEMENT EXTREMITY RIGHT HIP WITH POLY EXCHANGE POSTERIOR LATERAL POSITION,stem and head exchange (Right) ADDITIONAL DIAGNOSIS:  Expected Acute Blood Loss Anemia,  Polycythemia vera, hx of AKI, Hx UTI  Plan: WBAT, cont PT/OT, PosteriorTHA precautions - DVT Prophylaxis: SCD's - Pain minimal and well controlled on current regimen - D/C to SNFwhen medically stable - Wound vac in place - Plan to transfuse one unit of RBC's  -ID on board, cont Vanc per their protocol  -Anticipate 6wks IV ABX then transition to 6wks PO ABX

## 2018-05-01 NOTE — Progress Notes (Signed)
Nutrition Follow-up  DOCUMENTATION CODES:   Non-severe (moderate) malnutrition in context of acute illness/injury  INTERVENTION:   Continue Ensure Enlive po BID, each supplement provides 350 kcal and 20 grams of protein  NUTRITION DIAGNOSIS:   Moderate Malnutrition related to wound healing, post-op healing, acute illness as evidenced by severe fat depletion, moderate muscle depletion.  Ongoing.  GOAL:   Patient will meet greater than or equal to 90% of their needs  Progressing.  MONITOR:   PO intake, Supplement acceptance, Labs, Weight trends, Skin, I & O's  ASSESSMENT:   77 y.o. female with a past medical history of polycythemia vera, hypertension, status post right hip total arthropathy revision on 6/24 by Dr. Mayer Camel, who presents to ED for evaluation of bleeding from incision site.  9/11: s/p IRRIGATION AND DEBRIDEMENT EXTREMITY RIGHT HIP WITH POLY EXCHANGE POSTERIOR LATERAL POSITION,stem and head exchange (Right Hip)  Patient consuming 75-100% of meals. Pt is drinking Ensure supplements.  No new weight has been measured since 9/11.  Labs reviewed. Medications: Colace capsule BID, D5 and .45% NaCl w/ KCl infusion at 10 ml/hr  Diet Order:   Diet Order            Diet regular Room service appropriate? Yes; Fluid consistency: Thin  Diet effective now              EDUCATION NEEDS:   Education needs have been addressed  Skin:  Skin Assessment: Skin Integrity Issues: Skin Integrity Issues:: Incisions Incisions: right hip -Wound Vac  Last BM:  9/13  Height:   Ht Readings from Last 1 Encounters:  04/24/18 5\' 4"  (1.626 m)    Weight:   Wt Readings from Last 1 Encounters:  04/24/18 52.2 kg    Ideal Body Weight:  54.5 kg  BMI:  Body mass index is 19.74 kg/m.  Estimated Nutritional Needs:   Kcal:  1500-1700  Protein:  65-75g  Fluid:  1.7L/day  Clayton Bibles, MS, RD, LDN [place] Dietitian Pager: [phone] After Hours Pager:  [phone]

## 2018-05-01 NOTE — Consult Note (Addendum)
WOC follow-up: Bedside nurse changed the Vac dressing and cannister last night at 10 pm, according to progress notes, since it was reading "blockage."  Dressing is intact with good seal and 148mm cont suction.  100cc bloody drainage in the cannister.  Instructions provided for bedside nurses to troubleshoot by flushing tube with NS and changing the cannister if it alarms again.  Plan Vac dressing change on Fri if patient is still in the hospital at that time.  Julien Girt MSN, RN, [place], [place], [place]

## 2018-05-02 LAB — CBC WITH DIFFERENTIAL/PLATELET
BASOS ABS: 0 10*3/uL (ref 0.0–0.1)
BASOS PCT: 0 %
EOS PCT: 0 %
Eosinophils Absolute: 0 10*3/uL (ref 0.0–0.7)
HEMATOCRIT: 30.2 % — AB (ref 36.0–46.0)
Hemoglobin: 9.4 g/dL — ABNORMAL LOW (ref 12.0–15.0)
LYMPHS PCT: 2 %
Lymphs Abs: 0.4 10*3/uL — ABNORMAL LOW (ref 0.7–4.0)
MCH: 25.8 pg — ABNORMAL LOW (ref 26.0–34.0)
MCHC: 31.1 g/dL (ref 30.0–36.0)
MCV: 82.7 fL (ref 78.0–100.0)
MONOS PCT: 3 %
Monocytes Absolute: 0.7 10*3/uL (ref 0.1–1.0)
Myelocytes: 1 %
Neutro Abs: 21.3 10*3/uL — ABNORMAL HIGH (ref 1.7–7.7)
Neutrophils Relative %: 94 %
Platelets: 926 10*3/uL (ref 150–400)
RBC: 3.65 MIL/uL — ABNORMAL LOW (ref 3.87–5.11)
RDW: 21.4 % — ABNORMAL HIGH (ref 11.5–15.5)
WBC: 22.4 10*3/uL — AB (ref 4.0–10.5)

## 2018-05-02 LAB — CREATININE, SERUM
Creatinine, Ser: 0.83 mg/dL (ref 0.44–1.00)
GFR calc Af Amer: >60 mL/min (ref >60)
GFR calc non Af Amer: >60 mL/min (ref >60)

## 2018-05-02 LAB — TYPE AND SCREEN
ABO/RH(D): A POS
Antibody Screen: POSITIVE
DAT, IGG: NEGATIVE
Donor AG Type: NEGATIVE
UNIT DIVISION: 0

## 2018-05-02 LAB — BPAM RBC
Blood Product Expiration Date: 201910052359
ISSUE DATE / TIME: 201909182042
Unit Type and Rh: 6200

## 2018-05-02 LAB — VANCOMYCIN, PEAK: VANCOMYCIN PK: 38 ug/mL (ref 30–40)

## 2018-05-02 LAB — VANCOMYCIN, TROUGH: VANCOMYCIN TR: 7 ug/mL — AB (ref 15–20)

## 2018-05-02 MED ORDER — VANCOMYCIN HCL IN DEXTROSE 750-5 MG/150ML-% IV SOLN
750.0000 mg | INTRAVENOUS | Status: DC
  Administered 2018-05-03 – 2018-05-05 (×3): 750 mg via INTRAVENOUS
  Filled 2018-05-02 (×4): qty 150

## 2018-05-02 NOTE — Progress Notes (Signed)
Pharmacy Antibiotic Note  Alejandra [name] is a 77 y.o. female admitted on 04/23/2018 with wound dehiscence R THA s/p I&D.  Pharmacy has been consulted for vancomycin and ceftazidime dosing. Plan for 6 week course per ID recommendations.   Plan:  Continue ceftazidime 2g IV q12h for CrCl < 50 ml/min  Continue vancomycin 1250 mg IV q48h  Check Vancomycin peak and trough levels today to assess AUC.  Follow up renal function, cultures, clinical course.  Height: 5\' 4"  (162.6 cm) Weight: 115 lb (52.2 kg) IBW/kg (Calculated) : 54.7  Temp (24hrs), Avg:99.7 F (37.6 C), Min:98.8 F (37.1 C), Max:100.5 F (38.1 C)  Recent Labs  Lab 04/27/18 0358  04/29/18 0339 04/29/18 1637 04/30/18 0508 04/30/18 0900 05/01/18 0420 05/01/18 0800 05/02/18 0430  WBC 41.6*  --  43.5*  --   --  29.5*  --  25.1* 22.4*  CREATININE 0.79   < > 0.69 0.83 0.90  --  0.89  --  0.83   < > = values in this interval not displayed.    Estimated Creatinine Clearance: 46 mL/min (by C-G formula based on SCr of 0.83 mg/dL).     Antimicrobials this admission:  9/11 Cefazolin x 1 9/11 Ceftazidime >> 9/11 Vancomycin >>   Microbiology results:  9/11 MRSA PCR: neg 9/11 UCx: NGF 9/11 R hip wound tissue: normal skin flora, no anaerobes isolated   Thank you for allowing pharmacy to be a part of this patient's care.   Lindell Spar, PharmD, BCPS Pager: [phone] 05/02/2018 7:41 AM

## 2018-05-02 NOTE — Progress Notes (Signed)
Physical Therapy Treatment Patient Details Name: Alejandra [name] MRN: [id number] DOB: 1940-10-19 Today's Date: 05/02/2018    History of Present Illness 77 yo female adm with  right hip wound dehisence, hematoma-->s/p  IRRIGATION AND DEBRIDEMENT EXTREMITY RIGHT HIP WITH POLY EXCHANGE; PMH: Polycythemia vera, HTN, multiple R hip surgeries, most recent revision in  June of 2019    PT Comments    Pt with improved tolerance for ambulation today, and also improved recall of precautions. Pt with less jerky UE and LE movements during session today. Pt with buttocks pain complaints today, and states "RN has been putting cream on it". Due to concern for pressure ulceration on pt's gluteal region, PT offloaded area with pillows prior to leaving the pt's room. PT to continue to follow while acute.     Follow Up Recommendations  SNF;Supervision for mobility/OOB     Equipment Recommendations  None recommended by PT    Recommendations for Other Services       Precautions / Restrictions Precautions Precautions: Fall;Posterior Hip Precaution Comments: able to recall 2/3 precautions, recalled 3rd precaution with hinting from PT  Restrictions Weight Bearing Restrictions: No Other Position/Activity Restrictions: WBAT    Mobility  Bed Mobility Overal bed mobility: Needs Assistance Bed Mobility: Supine to Sit     Supine to sit: Mod assist;+2 for physical assistance;HOB elevated     General bed mobility comments: Mod assist +2 for posterior trunk control moving from supine to sit and in sitting, RLE management, and scooting to EOB with bed pad. Pt with no complaints of dizziness upon sitting up day. Verbal cuing for sequencing, hip precautions. UE tremors.  Transfers Overall transfer level: Needs assistance Equipment used: Rolling walker (2 wheeled) Transfers: Sit to/from Stand Sit to Stand: Min assist;+2 physical assistance;+2 safety/equipment         General transfer comment: Min assist  +2 for trunk elevation, steadying upon standing. Pt with mild UE tremors in initial standing, resolved. Verbal cuing for hand placement.   Ambulation/Gait Ambulation/Gait assistance: Min assist;+2 safety/equipment(recliner follow ) Gait Distance (Feet): 50 Feet Assistive device: Rolling walker (2 wheeled) Gait Pattern/deviations: Step-through pattern;Decreased stride length;Drifts right/left;Trunk flexed Gait velocity: decreased    General Gait Details: Pt with min assist for steadying during ambulation. Verbal cuing for placement in RW, standing with erect posture.    Stairs             Wheelchair Mobility    Modified Rankin (Stroke Patients Only)       Balance Overall balance assessment: History of Falls;Needs assistance Sitting-balance support: Bilateral upper extremity supported;Feet unsupported Sitting balance-Leahy Scale: Poor Sitting balance - Comments: Pt requires UE support to sit upright. Verbal and tactile cuing provided to right posture from posterior lean tendency, pt with feet off the floor today but pt unaware/unconcerned when cued.  Postural control: Posterior lean Standing balance support: Bilateral upper extremity supported Standing balance-Leahy Scale: Poor Standing balance comment: relies on RW and external assist                            Cognition Arousal/Alertness: Awake/alert Behavior During Therapy: WFL for tasks assessed/performed Overall Cognitive Status: Impaired/Different from baseline Area of Impairment: Problem solving;Safety/judgement                       Following Commands: Follows one step commands with increased time;Follows multi-step commands with increased time     Problem Solving: Difficulty sequencing;Requires  verbal cues;Requires tactile cues        Exercises Total Joint Exercises Quad Sets: AROM;10 reps;Right;Seated Long Arc Quad: AROM;Both;10 reps;Seated    General Comments General comments  (skin integrity, edema, etc.): pt with buttocks pain complaint today, potential skin breakdown from time spent in bed.       Pertinent Vitals/Pain Pain Assessment: No/denies pain    Home Living                      Prior Function            PT Goals (current goals can now be found in the care plan section) Acute Rehab PT Goals PT Goal Formulation: With patient Time For Goal Achievement: 05/06/18 Potential to Achieve Goals: Fair Progress towards PT goals: Progressing toward goals    Frequency    Min 2X/week      PT Plan Current plan remains appropriate    Co-evaluation              AM-PAC PT "6 Clicks" Daily Activity  Outcome Measure  Difficulty turning over in bed (including adjusting bedclothes, sheets and blankets)?: Unable Difficulty moving from lying on back to sitting on the side of the bed? : Unable Difficulty sitting down on and standing up from a chair with arms (e.g., wheelchair, bedside commode, etc,.)?: Unable Help needed moving to and from a bed to chair (including a wheelchair)?: A Little Help needed walking in hospital room?: A Little Help needed climbing 3-5 steps with a railing? : Total 6 Click Score: 10    End of Session Equipment Utilized During Treatment: Gait belt Activity Tolerance: Patient tolerated treatment well;Patient limited by fatigue Patient left: in chair;with call bell/phone within reach;with nursing/sitter in room;with chair alarm set Nurse Communication: Mobility status PT Visit Diagnosis: Other abnormalities of gait and mobility (R26.89);History of falling (Z91.81)     Time: 1137-1203 PT Time Calculation (min) (ACUTE ONLY): 26 min  Charges:  $Gait Training: 23-37 mins                     Julien Girt, PT Acute Rehabilitation Services Pager [phone]  Office [phone]   Karena Kinker D Elonda Husky 05/02/2018, 1:22 PM

## 2018-05-02 NOTE — Progress Notes (Addendum)
[name]  MRN: [id number]  DOB/AGE:  [birth year] / 77 y.o.  8 Days Post-Op Procedure(s) (LRB): IRRIGATION AND DEBRIDEMENT EXTREMITY RIGHT HIP WITH POLY EXCHANGE POSTERIOR LATERAL POSITION,stem and head exchange (Right)    PROGRESS NOTE Subjective: Patient is alert, oriented, no Nausea, no Vomiting, yes passing gas, . Taking PO well. Denies SOB, Chest or Calf Pain. Using Incentive Spirometer, PAS in place. Ambulate WBAT, though therapy did not get pt up yesterday, per the pt. Patient reports pain as  mild .  Pt has a small cough and intermittent fever.  It is responding to tylenol. U/A was negative.  Chest xray showed patch infiltrates at the base. Encouraged inspirometer use and ambulation with therapy.  Objective: Vital signs in last 24 hours: Vitals:   05/01/18 2034 05/01/18 2115 05/02/18 0036 05/02/18 0514  BP: 109/66 119/73 116/72 123/72  Pulse: 95 92 94 100  Resp: 18 18 20    Temp: 100.2 F (37.9 C) 99.9 F (37.7 C) 98.8 F (37.1 C) 99.7 F (37.6 C)  TempSrc: Oral Oral Oral Oral  SpO2: 96% 97% 95% 96%  Weight:      Height:          Intake/Output from previous day: I/O last 3 completed shifts: In: 1732.4 [P.O.:720; I.V.:289.9; Blood:406; IV Piggyback:316.5] Out: 2140 [Urine:2100; Drains:40]   Intake/Output this shift: No intake/output data recorded.   LABORATORY DATA: Recent Labs    04/29/18 1637  05/01/18 0420 05/01/18 0800 05/02/18 0430  WBC  --    < >  --  25.1* 22.4*  HGB  --    < >  --  8.0* 9.4*  HCT  --    < >  --  26.0* 30.2*  PLT  --    < >  --  1,094* 926*  NA 138  --   --   --   --   K 4.4  --   --   --   --   CL 104  --   --   --   --   CO2 29  --   --   --   --   BUN 17  --   --   --   --   CREATININE 0.83   < > 0.89  --  0.83  GLUCOSE 177*  --   --   --   --   CALCIUM 8.5*  --   --   --   --    < > = values in this interval not displayed.    Examination: Neurologically intact Neurovascular intact Sensation intact  distally Intact pulses distally Dorsiflexion/Plantar flexion intact Incision: dressing C/D/I No cellulitis present Compartment soft} XR AP&Lat of hip shows well placed\fixed THA  Assessment:   8 Days Post-Op Procedure(s) (LRB): IRRIGATION AND DEBRIDEMENT EXTREMITY RIGHT HIP WITH POLY EXCHANGE POSTERIOR LATERAL POSITION,stem and head exchange (Right) ADDITIONAL DIAGNOSIS:  Expected Acute Blood Loss Anemia,  Polycythemia vera, hx of AKI, Hx UTI  Plan: WBAT, cont PT/OT, PosteriorTHA precautions - DVT Prophylaxis: SCD's - Pain minimal and well controlled on current regimen - D/C to SNFwhen medically stable possibly tomorrow - Wound vac in place  -ID on board, cont Vanc per their protocol  -Anticipate 6wks IV ABX then transition to 6wks PO ABX  -Appreciate continued input from Dr. Benay Spice for continued management of Polycythemia Vera and clotting issues.

## 2018-05-02 NOTE — Progress Notes (Signed)
CSW updated SNF facility. The initial  insurance approval ended today. The facility will send updated clinicals for another approval.  CSW sent updated PT notes.

## 2018-05-02 NOTE — Progress Notes (Signed)
Pharmacy - IV vancomycin  Assessment:    Please see note from Lindell Spar, PharmD earlier today for full details. Briefly, 77 y.o. female on vancomycin for hip infection; plan is for 6 more weeks of vancomycin as an outpatient. A vanc peak and trough were collected to calculate PK parameters as follows:   Current AUC = 444 mcg*h/mL  T1/2 = 18 hr  Cpk = 40 mcg/mL  Cmin = 6.5 mcg/mL  Plan:   Although AUC is at goal, trough of 6.5 is less than ideal for extended therapy for joint infection; will increase vancomycin to 750 mg IV q24 hr (calculated AUC 532 w/ SCr 0.83; Cpk 33, Cmin 13.7)  Continue other abx per pharmacy as currently ordered  New IV antibiotic discharge orders are pended To discharging provider:  please sign these orders via discharge navigator, Select New Orders & click on the button choice - Manage This Unsigned Work.   Reuel Boom, PharmD, BCPS [phone] 05/02/2018, 6:25 PM

## 2018-05-02 NOTE — Progress Notes (Signed)
IP PROGRESS NOTE  Subjective:   [name] continues to have a cough.  No other complaint.  She was transfused 1 unit of packed red blood cells yesterday.   Objective: Vital signs in last 24 hours: Blood pressure 123/72, pulse 100, temperature 99.7 F (37.6 C), temperature source Oral, resp. rate 20, height 5\' 4"  (1.626 m), weight 115 lb (52.2 kg), SpO2 96 %.  Intake/Output from previous day: 09/18 0701 - 09/19 0700 In: 1271.7 [P.O.:480; I.V.:185.7; Blood:406; IV Piggyback:200] Out: 1500 [Urine:1500]  Physical Exam: Cardiac: Regular rate and rhythm Lungs: Decreased breath sounds with rhonchi at the lower posterior chest, no respiratory distress Abdomen: No hepatosplenomegaly Extremities: No leg edema Musculoskeletal: Wound VAC at the right thigh, small amount of old appearing bloody drainage in the tubing and collection canister, small ecchymosis at the inferior aspect of the wound  Lab Results: Recent Labs    05/01/18 0800 05/02/18 0430  WBC 25.1* 22.4*  HGB 8.0* 9.4*  HCT 26.0* 30.2*  PLT 1,094* 926*    9/15 PT 17.9 BMET Recent Labs    04/29/18 1637  05/01/18 0420 05/02/18 0430  NA 138  --   --   --   K 4.4  --   --   --   CL 104  --   --   --   CO2 29  --   --   --   GLUCOSE 177*  --   --   --   BUN 17  --   --   --   CREATININE 0.83   < > 0.89 0.83  CALCIUM 8.5*  --   --   --    < > = values in this interval not displayed.      Medications: I have reviewed the patient's current medications.  Assessment/Plan: 1. Polycythemia vera on hydroxyurea. Last phlebotomy 09/08/2015  Hydroxyurea dose increased to 1500 mg daily 06/26/2016  Hydroxyurea changed to 1500 mg daily on Monday through Friday, 1000 mg on Saturday and Sunday beginning 09/11/2016  Hydroxyurea changed to 1500 mg Monday Wednesday and Friday and 1000 mg other days beginning 10/10/2016  Hydroxyurea changed to 1000 mg daily beginning 10/31/2016  Hydroxyurea placed on hold beginning  11/27/2016-she misunderstood our instructions and continued 1000 mg daily.  Hydroxyurea dose decreased to 500 mg daily beginning 01/01/2017  Hydroxyurea dose increased 06/05/2017  Hydroxyurea dose increased to 1000 mg daily except for 500 mg on Monday, Wednesday, and Friday, 11/01/2017  Hydroxyurea discontinued 04/25/2018  Anagrelide started 04/25/2018 2. Hypertension, followed by Dr. Montez Morita.  3. History of hyperpigmentation at the hands. Question related to hydroxyurea. 4. Hip fracture February 2013. She underwent a bipolar hip arthroplasty on 04/23/2012. 5. Thrombocytosis. Most likely secondary to polycythemia vera and iron deficiency. 6. Hospitalization with left shoulder cellulitis February 2016. 7. Pelvic fracture after a fall 05/31/2015 8. Weight loss-etiology unclear; weight stable 12/16/2015 and06/15/2017 9. Right anterior buccal ulcer 03/09/2016 10. Revision of right hip arthroplasty 4/96/7591, complicated by intraoperative and postoperative bleeding requiring multiple red cell transfusions 11. Swelling at the right hip surgical site week of 04/08/2018- 220 cc bloody fluid aspirated from the right hip surgical site 04/11/2018  Right thigh wound dehiscence prompting readmission 04/23/2018  CT 04/23/2018- gas/fluid collection at the lateral right hip with area of focal cortical destruction concerning for infection  Irrigation/debridement and replacement of right hip hardware 04/24/2018 12.  Coagulopathy 13.  Anemia secondary to surgical blood loss 14.  Fever/cough, chest x-ray 05/01/2018 suggestive of pneumonia  Alejandra [name] continues to have a low-grade fever and cough.  She may have pneumonia.  She is on antibiotics.  I discussed the case with orthopedics.  They will reconsult infectious disease if the fever persists. The platelet count is lower on anagrelide.  She does not appear to have excessive bleeding. Alejandra [name] will check on her 05/03/2018.  Recommendations: 1.  Continue  anagrelide 2.  Ambulation, physical therapy per orthopedics 3.,  Evaluation of fever per infectious disease    Please call hematology as needed.  I will be out until 05/13/2018.  Outpatient follow-up will be scheduled at the cancer center.  LOS: 9 days   Alejandra Coder, MD   05/02/2018, 7:28 AM

## 2018-05-02 NOTE — Consult Note (Signed)
WOC follow-up: Vac intact with good seal.  Mod amt bloody drainage in the cannister, cont suction at 149mm cont suction.  WOC will plan dressing change on Fri if patient is still in the hospital at that time. Julien Girt MSN, RN, [place], [place], [place]

## 2018-05-03 ENCOUNTER — Other Ambulatory Visit: Payer: Self-pay | Admitting: Nurse Practitioner

## 2018-05-03 ENCOUNTER — Telehealth: Payer: Self-pay | Admitting: Oncology

## 2018-05-03 DIAGNOSIS — D45 Polycythemia vera: Secondary | ICD-10-CM

## 2018-05-03 DIAGNOSIS — Y838 Other surgical procedures as the cause of abnormal reaction of the patient, or of later complication, without mention of misadventure at the time of the procedure: Secondary | ICD-10-CM

## 2018-05-03 LAB — CBC WITH DIFFERENTIAL/PLATELET
BASOS ABS: 0 10*3/uL (ref 0.0–0.1)
BASOS PCT: 0 %
Band Neutrophils: 0 %
Blasts: 0 %
Eosinophils Absolute: 0.7 10*3/uL (ref 0.0–0.7)
Eosinophils Relative: 3 %
HCT: 31.1 % — ABNORMAL LOW (ref 36.0–46.0)
Hemoglobin: 9.4 g/dL — ABNORMAL LOW (ref 12.0–15.0)
Lymphocytes Relative: 2 %
Lymphs Abs: 0.5 10*3/uL — ABNORMAL LOW (ref 0.7–4.0)
MCH: 25.2 pg — ABNORMAL LOW (ref 26.0–34.0)
MCHC: 30.2 g/dL (ref 30.0–36.0)
MCV: 83.4 fL (ref 78.0–100.0)
MONO ABS: 0.2 10*3/uL (ref 0.1–1.0)
MYELOCYTES: 1 %
Metamyelocytes Relative: 0 %
Monocytes Relative: 1 %
NEUTROS PCT: 93 %
NRBC: 0 /100{WBCs}
Neutro Abs: 22.3 10*3/uL — ABNORMAL HIGH (ref 1.7–7.7)
Other: 0 %
PLATELETS: 776 10*3/uL — AB (ref 150–400)
PROMYELOCYTES RELATIVE: 0 %
RBC: 3.73 MIL/uL — AB (ref 3.87–5.11)
RDW: 22.3 % — ABNORMAL HIGH (ref 11.5–15.5)
WBC: 23.7 10*3/uL — ABNORMAL HIGH (ref 4.0–10.5)

## 2018-05-03 LAB — CREATININE, SERUM
CREATININE: 0.86 mg/dL (ref 0.44–1.00)
GFR calc Af Amer: >60 mL/min (ref >60)

## 2018-05-03 NOTE — Telephone Encounter (Signed)
Left message re 10/4 lab/fu.

## 2018-05-03 NOTE — Progress Notes (Signed)
Alejandra [name] is doing well this afternoon.  She does not have as much drainage from what I can tell.  She still is recovering slowly from the right hip repair.  She is getting some physical therapy.  She ambulated a little bit today.  Her labs show continued improvement in the platelets.  Her platelets now are 776,000.  Her hemoglobin is 9.4 and white cell count 23.7.  The anagrelide is working nicely.  Her platelet count is trending down.  Her hemoglobin is holding steady.  She still has a higher white cell count.  She has had no obvious fever.  Her appetite is okay.  She did have a little bit of diarrhea.  She thinks there was a lot of sugar on her last meal.  She is had no rashes.  She has ice packs on the right hip.  On her physical exam, her vital signs show temperature 98.9.  Pulse 97.  Blood pressure 107/61.  Her lungs are clear bilaterally.  I hear no rales or  rhonchi.  Cardiac exam is slightly tachycardic but regular.  Abdomen is soft.  Bowel sounds are present.  There is no fluid wave.  There is no palpable liver or spleen tip.  With her hips, she does have drainage tubes coming out of the right hip.  Neurological exam is nonfocal.  Alejandra [name] has polycythemia vera.  She underwent hip repair.  She is improving.  Her platelet count is responding nicely.  I would not change the dose of anagrelide.  We will continue to follow along.  Hopefully, she will get to the point where she will be able to be discharged.  I know that she is getting fantastic care from all the staff on [address].  Lattie Haw, MD  [place] 12:12

## 2018-05-03 NOTE — Progress Notes (Signed)
At 1300, I went to reposition the pt in bed when I noticed a small amount of blood on the bed pad. The pt's wound vac dressing was leaking blood, but the wound vac seal was not leaking. I reinforced the dressing and called the wound nurse, Julien Girt, who changed the dressing this morning. The wound nurse notified me that the leakage is normal d/t the pt's hx of a blood disorder. Will continue to monitor and assess the pt.

## 2018-05-03 NOTE — Progress Notes (Signed)
[name]  MRN: [id number]  DOB/AGE:  01/26/1941 / 77 y.o.  9 Days Post-Op Procedure(s) (LRB): IRRIGATION AND DEBRIDEMENT EXTREMITY RIGHT HIP WITH POLY EXCHANGE POSTERIOR LATERAL POSITION,stem and head exchange (Right)    PROGRESS NOTE Subjective: Patient is alert, oriented, no Nausea, no Vomiting, yes passing gas, . Taking PO well. Denies SOB, Chest or Calf Pain. Using Incentive Spirometer, PAS in place. Ambulate WBAT with pt walking 50 ft with therapy Patient reports pain as  mild .    Objective: Vital signs in last 24 hours: Vitals:   05/02/18 0934 05/02/18 1401 05/02/18 2201 05/03/18 0529  BP: 115/66 123/67 121/70 119/70  Pulse:  (!) 104 (!) 101 98  Resp:  16 18 18   Temp:  99.3 F (37.4 C) 98.1 F (36.7 C) 99 F (37.2 C)  TempSrc:  Oral Oral Oral  SpO2:  (!) 84% 95% 97%  Weight:      Height:          Intake/Output from previous day: I/O last 3 completed shifts: In: 2478 [P.O.:1240; I.V.:282; Blood:406; IV Piggyback:550] Out: 1985 [Urine:1900; Drains:85]   Intake/Output this shift: No intake/output data recorded.   LABORATORY DATA: Recent Labs    05/02/18 0430 05/03/18 0309  WBC 22.4* 23.7*  HGB 9.4* 9.4*  HCT 30.2* 31.1*  PLT 926* 776*  CREATININE 0.83 0.86    Examination: Neurologically intact Neurovascular intact Sensation intact distally Intact pulses distally Dorsiflexion/Plantar flexion intact Incision: moderate drainage No cellulitis present Compartment soft} XR AP&Lat of hip shows well placed\fixed THA  Wound vac changed by wound care nurse.  Assessment:   9 Days Post-Op Procedure(s) (LRB): IRRIGATION AND DEBRIDEMENT EXTREMITY RIGHT HIP WITH POLY EXCHANGE POSTERIOR LATERAL POSITION,stem and head exchange (Right) ADDITIONAL DIAGNOSIS:  Expected Acute Blood Loss Anemia,  Polycythemia vera, hx of AKI, Hx UTI  Plan: WBAT, cont PT/OT, PosteriorTHA precautions - DVT Prophylaxis: SCD's - Pain minimal and well controlled on  current regimen - Plan to hold pt until Monday - Wound vac in place  -ID on board, cont Vanc per their protocol  -Anticipate 6wks IV ABX then transition to 6wks PO ABX  -Appreciate continued input from Dr. Benay Spice for continued management of Polycythemia Vera and clotting issues.

## 2018-05-03 NOTE — Telephone Encounter (Signed)
Schedule mailed.  °

## 2018-05-03 NOTE — Consult Note (Addendum)
[facility] Nurse wound consult note Vac dressing change to right hip incision; pt was medicated for pain prior to the procedure and tolerated with minimal discomfort.   Wound type: Right hip with full thickness post-op wound over the middle of the incision.  Mod amt bloody drainage in the cannister. Incision site well approximated except for a location in the middle; .3X.3X2cm; too narrow to visualize but it appears to be red and moist. with mod amt bloody drainage when swab inserted for assessment.  Applied 2 pieces black foam to 175mm cont suction; one to tunneling area and another over the incision line.   Periwound: Intact skin surrounding wound Dressing procedure/placement/frequency: WOC will change Vac dressing Q M/W/F while the patient is in the hospital. Julien Girt MSN, RN, [place], [employer], [place]

## 2018-05-04 LAB — CREATININE, SERUM: Creatinine, Ser: 0.82 mg/dL (ref 0.44–1.00)

## 2018-05-04 LAB — CBC WITH DIFFERENTIAL/PLATELET
Band Neutrophils: 2 %
Basophils Absolute: 0 10*3/uL (ref 0.0–0.1)
Basophils Relative: 0 %
Eosinophils Absolute: 0.9 10*3/uL — ABNORMAL HIGH (ref 0.0–0.7)
Eosinophils Relative: 4 %
HCT: 30.3 % — ABNORMAL LOW (ref 36.0–46.0)
Hemoglobin: 9.2 g/dL — ABNORMAL LOW (ref 12.0–15.0)
LYMPHS ABS: 0.7 10*3/uL (ref 0.7–4.0)
Lymphocytes Relative: 3 %
MCH: 25.4 pg — AB (ref 26.0–34.0)
MCHC: 30.4 g/dL (ref 30.0–36.0)
MCV: 83.7 fL (ref 78.0–100.0)
METAMYELOCYTES PCT: 1 %
MONO ABS: 0.7 10*3/uL (ref 0.1–1.0)
MONOS PCT: 3 %
Neutro Abs: 19.7 10*3/uL — ABNORMAL HIGH (ref 1.7–7.7)
Neutrophils Relative %: 87 %
PLATELETS: 503 10*3/uL — AB (ref 150–400)
RBC: 3.62 MIL/uL — AB (ref 3.87–5.11)
RDW: 22.6 % — ABNORMAL HIGH (ref 11.5–15.5)
WBC: 22 10*3/uL — ABNORMAL HIGH (ref 4.0–10.5)

## 2018-05-04 NOTE — Progress Notes (Signed)
Physical Therapy Treatment Patient Details Name: Alejandra [name] MRN: [id number] DOB: August 12, 1941 Today's Date: 05/04/2018    History of Present Illness 77 yo female adm with  right hip wound dehisence, hematoma-->s/p  IRRIGATION AND DEBRIDEMENT EXTREMITY RIGHT HIP WITH POLY EXCHANGE; PMH: Polycythemia vera, HTN, multiple R hip surgeries, most recent revision in  June of 2019    PT Comments    POD # 10 R hip I&D Assisted pt OOB to amb required increased assist due to tremors throughout   VERY unsteady with poor self control  amb 50 feet yesterday + 1 assist but today required + 2 assist at 11 feet.  Assisted back to bed per pt request and positioned sidelying with multiple pillows for comfort and THP.    Follow Up Recommendations  SNF;Supervision for mobility/OOB     Equipment Recommendations  None recommended by PT    Recommendations for Other Services       Precautions / Restrictions Precautions Precautions: Fall;Posterior Hip Precaution Comments: pt able to verbalize 3/3 THP Restrictions Weight Bearing Restrictions: No Other Position/Activity Restrictions: WBAT    Mobility  Bed Mobility Overal bed mobility: Needs Assistance Bed Mobility: Supine to Sit;Sit to Supine     Supine to sit: Max assist;+2 for physical assistance;+2 for safety/equipment Sit to supine: Max assist;Total assist;+2 for physical assistance;+2 for safety/equipment   General bed mobility comments: required increased assist this session due to tremors throughout and c/o fatigue.  Severe posterior lean EOB   Transfers Overall transfer level: Needs assistance Equipment used: Rolling walker (2 wheeled) Transfers: Sit to/from Stand Sit to Stand: Mod assist;Max assist;+2 physical assistance;+2 safety/equipment Stand pivot transfers: Mod assist;Max assist;+2 physical assistance;+2 safety/equipment       General transfer comment: required increased assist this session due to tremors throughout and c/o  fatigue   Ambulation/Gait Ambulation/Gait assistance: Mod assist;Max assist;+2 physical assistance;+2 safety/equipment Gait Distance (Feet): 22 Feet(11 feet x 2 one sitting rest break) Assistive device: Rolling walker (2 wheeled) Gait Pattern/deviations: Step-through pattern;Decreased stride length;Drifts right/left;Trunk flexed Gait velocity: decreased    General Gait Details: required increased assist due to tremors throughout   VERY unsteady with poor self control    Stairs             Wheelchair Mobility    Modified Rankin (Stroke Patients Only)       Balance                                            Cognition Arousal/Alertness: Awake/alert Behavior During Therapy: WFL for tasks assessed/performed Overall Cognitive Status: Within Functional Limits for tasks assessed                                 General Comments: feels "tired"      Exercises      General Comments        Pertinent Vitals/Pain Pain Assessment: No/denies pain Pain Location: "It's okay"  (R hip)     Home Living                      Prior Function            PT Goals (current goals can now be found in the care plan section)      Frequency    Min  2X/week      PT Plan Current plan remains appropriate    Co-evaluation              AM-PAC PT "6 Clicks" Daily Activity  Outcome Measure  Difficulty turning over in bed (including adjusting bedclothes, sheets and blankets)?: Unable Difficulty moving from lying on back to sitting on the side of the bed? : Unable Difficulty sitting down on and standing up from a chair with arms (e.g., wheelchair, bedside commode, etc,.)?: Unable Help needed moving to and from a bed to chair (including a wheelchair)?: Total Help needed walking in hospital room?: Total Help needed climbing 3-5 steps with a railing? : Total 6 Click Score: 6    End of Session Equipment Utilized During Treatment: Gait  belt Activity Tolerance: Other (comment)(increased tremors and fatigue level ) Patient left: in bed;with bed alarm set;with call bell/phone within reach Nurse Communication: Mobility status(RN observed) PT Visit Diagnosis: Other abnormalities of gait and mobility (R26.89);History of falling (Z91.81)     Time: 6415-8309 PT Time Calculation (min) (ACUTE ONLY): 30 min  Charges:  $Gait Training: 8-22 mins $Therapeutic Activity: 8-22 mins                     Rica Koyanagi  PTA Acute  Rehabilitation Services Pager      [phone] Office      [phone]

## 2018-05-04 NOTE — Progress Notes (Signed)
Subjective: 10 Days Post-Op Procedure(s) (LRB): IRRIGATION AND DEBRIDEMENT EXTREMITY RIGHT HIP WITH POLY EXCHANGE POSTERIOR LATERAL POSITION,stem and head exchange (Right) Patient reports pain as 3 on 0-10 scale.   Doing well.  Has been up with therapy.  No SOB or dizziness.   Objective: Vital signs in last 24 hours: Temp:  [98.1 F (36.7 C)-99.5 F (37.5 C)] 98.1 F (36.7 C) (09/21 0547) Pulse Rate:  [86-97] 86 (09/21 0547) Resp:  [16] 16 (09/21 0547) BP: (107-128)/(61-78) 128/66 (09/21 0547) SpO2:  [97 %-100 %] 100 % (09/21 0547)  Intake/Output from previous day: 09/20 0701 - 09/21 0700 In: 1531.1 [P.O.:960; I.V.:221.5; IV Piggyback:349.7] Out: 1650 [Urine:1450; Drains:200] Intake/Output this shift: Total I/O In: 240 [P.O.:240] Out: 300 [Urine:300]  Recent Labs    05/02/18 0430 05/03/18 0309 05/04/18 0500  HGB 9.4* 9.4* 9.2*   Recent Labs    05/03/18 0309 05/04/18 0500  WBC 23.7* 22.0*  RBC 3.73* 3.62*  HCT 31.1* 30.3*  PLT 776* 503*   Recent Labs    05/03/18 0309 05/04/18 0500  CREATININE 0.86 0.82   No results for input(s): LABPT, INR in the last 72 hours.  Neurologically intact  Anticipated LOS equal to or greater than 2 midnights due to - Age 11 and older with one or more of the following:  - Obesity  - Expected need for hospital services (PT, OT, Nursing) required for safe  discharge  - Anticipated need for postoperative skilled nursing care or inpatient rehab  - Active co-morbidities: Bleeding clotting disorders OR   - Unanticipated findings during/Post Surgery: None  - Patient is a high risk of re-admission due to: None   Assessment/Plan: 10 Days Post-Op Procedure(s) (LRB): IRRIGATION AND DEBRIDEMENT EXTREMITY RIGHT HIP WITH POLY EXCHANGE POSTERIOR LATERAL POSITION,stem and head exchange (Right) Up with therapy   WBAT, cont PT/OT, PosteriorTHA precautions - DVT Prophylaxis: SCD's - Pain minimal and well controlled on current regimen -  Plan to hold pt until Monday - Wound vac in place  -ID on board, cont Vanc per their protocol  -Anticipate 6wks IV ABX then transition to 6wks PO ABX  -Appreciate continued input from Dr. Benay Spice for continued management of Polycythemia Vera and clotting issues.    Alejandra [name] 05/04/2018, 9:54 AM

## 2018-05-05 LAB — CBC WITH DIFFERENTIAL/PLATELET
BASOS PCT: 1 %
Basophils Absolute: 0.2 10*3/uL — ABNORMAL HIGH (ref 0.0–0.1)
EOS PCT: 3 %
Eosinophils Absolute: 0.7 10*3/uL (ref 0.0–0.7)
HEMATOCRIT: 31.3 % — AB (ref 36.0–46.0)
HEMOGLOBIN: 9.5 g/dL — AB (ref 12.0–15.0)
Lymphocytes Relative: 4 %
Lymphs Abs: 0.9 10*3/uL (ref 0.7–4.0)
MCH: 25.4 pg — ABNORMAL LOW (ref 26.0–34.0)
MCHC: 30.4 g/dL (ref 30.0–36.0)
MCV: 83.7 fL (ref 78.0–100.0)
MONO ABS: 0.7 10*3/uL (ref 0.1–1.0)
MONOS PCT: 3 %
NEUTROS PCT: 89 %
Neutro Abs: 21.1 10*3/uL — ABNORMAL HIGH (ref 1.7–7.7)
Platelets: 428 10*3/uL — ABNORMAL HIGH (ref 150–400)
RBC: 3.74 MIL/uL — AB (ref 3.87–5.11)
RDW: 23.1 % — AB (ref 11.5–15.5)
WBC: 23.6 10*3/uL — ABNORMAL HIGH (ref 4.0–10.5)

## 2018-05-05 LAB — CREATININE, SERUM
Creatinine, Ser: 0.91 mg/dL (ref 0.44–1.00)
GFR calc Af Amer: >60 mL/min (ref >60)
GFR calc non Af Amer: 59 mL/min — ABNORMAL LOW (ref >60)

## 2018-05-05 NOTE — Progress Notes (Signed)
Subjective: 11 Days Post-Op Procedure(s) (LRB): IRRIGATION AND DEBRIDEMENT EXTREMITY RIGHT HIP WITH POLY EXCHANGE POSTERIOR LATERAL POSITION,stem and head exchange (Right) Patient reports pain as mild.   Worked with PT some yesterday.  No major complaints today.  No SOB or dizziness.   Objective: Vital signs in last 24 hours: Temp:  [98.3 F (36.8 C)-99.2 F (37.3 C)] 98.6 F (37 C) (09/22 0516) Pulse Rate:  [87-102] 95 (09/22 1053) Resp:  [15-18] 18 (09/22 0516) BP: (117-138)/(62-77) 138/77 (09/22 1053) SpO2:  [98 %-100 %] 100 % (09/22 1053)  Intake/Output from previous day: 09/21 0701 - 09/22 0700 In: 1196.3 [P.O.:600; I.V.:42; IV Piggyback:554.4] Out: 1400 [Urine:1400] Intake/Output this shift: Total I/O In: 60 [P.O.:60] Out: -   Recent Labs    05/03/18 0309 05/04/18 0500 05/05/18 0407  HGB 9.4* 9.2* 9.5*   Recent Labs    05/04/18 0500 05/05/18 0407  WBC 22.0* 23.6*  RBC 3.62* 3.74*  HCT 30.3* 31.3*  PLT 503* 428*   Recent Labs    05/04/18 0500 05/05/18 0407  CREATININE 0.82 0.91   No results for input(s): LABPT, INR in the last 72 hours.  Awake and alert  Vac suctioning to left hip region.  Min output Dressing intact Wiggles toes, SILT about feet No pain with calf sqeeze.    - Unanticipated findings during/Post Surgery: Slow post-op progression: GI, pain control, mobility  - Patient is a high risk of re-admission due to: Non-elective hospital admission within previous 6 months   Assessment/Plan: 11 Days Post-Op Procedure(s) (LRB): IRRIGATION AND DEBRIDEMENT EXTREMITY RIGHT HIP WITH POLY EXCHANGE POSTERIOR LATERAL POSITION,stem and head exchange (Right) Up with therapy  WBAT, cont PT/OT, PosteriorTHA precautions - DVT Prophylaxis: SCD's - Pain minimal and well controlled on current regimen -Plan to hold pt until Monday - Wound vac in place - min output  -ID on board, cont Vanc per their protocol  -Anticipate 6wks IV ABX then  transition to 6wks PO ABX  -Appreciate continued input from Dr. Benay Spice for continued management of Polycythemia Vera and clotting issues.  Erle Crocker 05/05/2018, 12:37 PM

## 2018-05-05 NOTE — Progress Notes (Signed)
Pharmacy Antibiotic Note  Alejandra [name] is a 77 y.o. female admitted on 04/23/2018 with wound dehiscence R THA s/p I&D.  Pharmacy has been consulted for vancomycin and ceftazidime dosing. Plan for 6 week course per ID recommendations.   Today, 05/05/18  WBC 23.6  Afebrile  Day #11 of antibiotics, planning for 6 weeks course per notes. OPAT has been completed  Plan:  Continue ceftazidime 2g IV q12h for CrCl < 50 ml/min  Continue vancomycin 750 mg IV q24h  If patient reaches steady state on vancomycin prior to discharge, check VT with goal of 15-20 mcg/mL  Follow up renal function, cultures, clinical course.  Height: 5\' 4"  (162.6 cm) Weight: 115 lb (52.2 kg) IBW/kg (Calculated) : 54.7  Temp (24hrs), Avg:98.7 F (37.1 C), Min:98.3 F (36.8 C), Max:99.2 F (37.3 C)  Recent Labs  Lab 05/01/18 0420 05/01/18 0800 05/02/18 0430 05/02/18 1015 05/02/18 1537 05/03/18 0309 05/04/18 0500 05/05/18 0407  WBC  --  25.1* 22.4*  --   --  23.7* 22.0* 23.6*  CREATININE 0.89  --  0.83  --   --  0.86 0.82 0.91  VANCOTROUGH  --   --   --  7*  --   --   --   --   VANCOPEAK  --   --   --   --  38  --   --   --     Estimated Creatinine Clearance: 42 mL/min (by C-G formula based on SCr of 0.91 mg/dL).     Antimicrobials this admission:  9/11 Cefazolin x 1 9/11 Ceftazidime >> 9/11 Vancomycin >>   Microbiology results:  9/11 MRSA PCR: neg 9/11 UCx: NGF 9/11 R hip wound tissue: normal skin flora, no anaerobes isolated   Thank you for allowing pharmacy to be a part of this patient's care.  Lenis Noon, PharmD 05/05/18 1:27 PM

## 2018-05-05 NOTE — Plan of Care (Signed)
  Problem: Clinical Measurements: Goal: Will remain free from infection Outcome: Progressing   Problem: Clinical Measurements: Goal: Diagnostic test results will improve Outcome: Progressing   Problem: Activity: Goal: Risk for activity intolerance will decrease Outcome: Progressing   

## 2018-05-06 DIAGNOSIS — T8459XD Infection and inflammatory reaction due to other internal joint prosthesis, subsequent encounter: Secondary | ICD-10-CM | POA: Diagnosis not present

## 2018-05-06 DIAGNOSIS — G9341 Metabolic encephalopathy: Secondary | ICD-10-CM | POA: Diagnosis not present

## 2018-05-06 DIAGNOSIS — J90 Pleural effusion, not elsewhere classified: Secondary | ICD-10-CM | POA: Diagnosis not present

## 2018-05-06 DIAGNOSIS — H44513 Absolute glaucoma, bilateral: Secondary | ICD-10-CM | POA: Diagnosis not present

## 2018-05-06 DIAGNOSIS — B9689 Other specified bacterial agents as the cause of diseases classified elsewhere: Secondary | ICD-10-CM | POA: Diagnosis not present

## 2018-05-06 DIAGNOSIS — R0989 Other specified symptoms and signs involving the circulatory and respiratory systems: Secondary | ICD-10-CM | POA: Diagnosis not present

## 2018-05-06 DIAGNOSIS — I158 Other secondary hypertension: Secondary | ICD-10-CM | POA: Diagnosis not present

## 2018-05-06 DIAGNOSIS — R64 Cachexia: Secondary | ICD-10-CM | POA: Diagnosis not present

## 2018-05-06 DIAGNOSIS — I10 Essential (primary) hypertension: Secondary | ICD-10-CM | POA: Diagnosis not present

## 2018-05-06 DIAGNOSIS — L299 Pruritus, unspecified: Secondary | ICD-10-CM | POA: Diagnosis present

## 2018-05-06 DIAGNOSIS — Z8249 Family history of ischemic heart disease and other diseases of the circulatory system: Secondary | ICD-10-CM | POA: Diagnosis not present

## 2018-05-06 DIAGNOSIS — Z87891 Personal history of nicotine dependence: Secondary | ICD-10-CM | POA: Diagnosis not present

## 2018-05-06 DIAGNOSIS — M96840 Postprocedural hematoma of a musculoskeletal structure following a musculoskeletal system procedure: Secondary | ICD-10-CM | POA: Diagnosis not present

## 2018-05-06 DIAGNOSIS — T8450XD Infection and inflammatory reaction due to unspecified internal joint prosthesis, subsequent encounter: Secondary | ICD-10-CM | POA: Diagnosis not present

## 2018-05-06 DIAGNOSIS — Z9889 Other specified postprocedural states: Secondary | ICD-10-CM | POA: Diagnosis not present

## 2018-05-06 DIAGNOSIS — M0080 Arthritis due to other bacteria, unspecified joint: Secondary | ICD-10-CM | POA: Diagnosis not present

## 2018-05-06 DIAGNOSIS — Y831 Surgical operation with implant of artificial internal device as the cause of abnormal reaction of the patient, or of later complication, without mention of misadventure at the time of the procedure: Secondary | ICD-10-CM | POA: Diagnosis present

## 2018-05-06 DIAGNOSIS — L270 Generalized skin eruption due to drugs and medicaments taken internally: Secondary | ICD-10-CM | POA: Diagnosis present

## 2018-05-06 DIAGNOSIS — E611 Iron deficiency: Secondary | ICD-10-CM | POA: Diagnosis present

## 2018-05-06 DIAGNOSIS — I119 Hypertensive heart disease without heart failure: Secondary | ICD-10-CM | POA: Diagnosis present

## 2018-05-06 DIAGNOSIS — Y838 Other surgical procedures as the cause of abnormal reaction of the patient, or of later complication, without mention of misadventure at the time of the procedure: Secondary | ICD-10-CM | POA: Diagnosis not present

## 2018-05-06 DIAGNOSIS — D689 Coagulation defect, unspecified: Secondary | ICD-10-CM | POA: Diagnosis not present

## 2018-05-06 DIAGNOSIS — Z7189 Other specified counseling: Secondary | ICD-10-CM | POA: Diagnosis not present

## 2018-05-06 DIAGNOSIS — R21 Rash and other nonspecific skin eruption: Secondary | ICD-10-CM | POA: Diagnosis not present

## 2018-05-06 DIAGNOSIS — Z96649 Presence of unspecified artificial hip joint: Secondary | ICD-10-CM | POA: Diagnosis not present

## 2018-05-06 DIAGNOSIS — T84099A Other mechanical complication of unspecified internal joint prosthesis, initial encounter: Secondary | ICD-10-CM | POA: Diagnosis not present

## 2018-05-06 DIAGNOSIS — Z978 Presence of other specified devices: Secondary | ICD-10-CM | POA: Diagnosis not present

## 2018-05-06 DIAGNOSIS — E875 Hyperkalemia: Secondary | ICD-10-CM | POA: Diagnosis present

## 2018-05-06 DIAGNOSIS — T50905D Adverse effect of unspecified drugs, medicaments and biological substances, subsequent encounter: Secondary | ICD-10-CM | POA: Diagnosis not present

## 2018-05-06 DIAGNOSIS — D691 Qualitative platelet defects: Secondary | ICD-10-CM | POA: Diagnosis present

## 2018-05-06 DIAGNOSIS — Z79899 Other long term (current) drug therapy: Secondary | ICD-10-CM | POA: Diagnosis not present

## 2018-05-06 DIAGNOSIS — R5381 Other malaise: Secondary | ICD-10-CM | POA: Diagnosis not present

## 2018-05-06 DIAGNOSIS — R944 Abnormal results of kidney function studies: Secondary | ICD-10-CM | POA: Diagnosis not present

## 2018-05-06 DIAGNOSIS — L89152 Pressure ulcer of sacral region, stage 2: Secondary | ICD-10-CM | POA: Diagnosis present

## 2018-05-06 DIAGNOSIS — T84090A Other mechanical complication of internal right hip prosthesis, initial encounter: Secondary | ICD-10-CM | POA: Diagnosis not present

## 2018-05-06 DIAGNOSIS — R627 Adult failure to thrive: Secondary | ICD-10-CM | POA: Diagnosis present

## 2018-05-06 DIAGNOSIS — Z66 Do not resuscitate: Secondary | ICD-10-CM | POA: Diagnosis present

## 2018-05-06 DIAGNOSIS — Z515 Encounter for palliative care: Secondary | ICD-10-CM | POA: Diagnosis present

## 2018-05-06 DIAGNOSIS — M00851 Arthritis due to other bacteria, right hip: Secondary | ICD-10-CM | POA: Diagnosis not present

## 2018-05-06 DIAGNOSIS — Z96641 Presence of right artificial hip joint: Secondary | ICD-10-CM | POA: Diagnosis not present

## 2018-05-06 DIAGNOSIS — Y792 Prosthetic and other implants, materials and accessory orthopedic devices associated with adverse incidents: Secondary | ICD-10-CM | POA: Diagnosis present

## 2018-05-06 DIAGNOSIS — R410 Disorientation, unspecified: Secondary | ICD-10-CM | POA: Diagnosis not present

## 2018-05-06 DIAGNOSIS — W19XXXD Unspecified fall, subsequent encounter: Secondary | ICD-10-CM | POA: Diagnosis not present

## 2018-05-06 DIAGNOSIS — N179 Acute kidney failure, unspecified: Secondary | ICD-10-CM | POA: Diagnosis not present

## 2018-05-06 DIAGNOSIS — T8451XA Infection and inflammatory reaction due to internal right hip prosthesis, initial encounter: Secondary | ICD-10-CM | POA: Diagnosis not present

## 2018-05-06 DIAGNOSIS — T8451XD Infection and inflammatory reaction due to internal right hip prosthesis, subsequent encounter: Secondary | ICD-10-CM | POA: Diagnosis not present

## 2018-05-06 DIAGNOSIS — H81319 Aural vertigo, unspecified ear: Secondary | ICD-10-CM | POA: Diagnosis not present

## 2018-05-06 DIAGNOSIS — E861 Hypovolemia: Secondary | ICD-10-CM | POA: Diagnosis present

## 2018-05-06 DIAGNOSIS — R234 Changes in skin texture: Secondary | ICD-10-CM | POA: Diagnosis not present

## 2018-05-06 DIAGNOSIS — Z95828 Presence of other vascular implants and grafts: Secondary | ICD-10-CM | POA: Diagnosis not present

## 2018-05-06 DIAGNOSIS — D62 Acute posthemorrhagic anemia: Secondary | ICD-10-CM | POA: Diagnosis not present

## 2018-05-06 DIAGNOSIS — M2550 Pain in unspecified joint: Secondary | ICD-10-CM | POA: Diagnosis not present

## 2018-05-06 DIAGNOSIS — H409 Unspecified glaucoma: Secondary | ICD-10-CM | POA: Diagnosis present

## 2018-05-06 DIAGNOSIS — Z7401 Bed confinement status: Secondary | ICD-10-CM | POA: Diagnosis not present

## 2018-05-06 DIAGNOSIS — M96841 Postprocedural hematoma of a musculoskeletal structure following other procedure: Secondary | ICD-10-CM | POA: Diagnosis not present

## 2018-05-06 DIAGNOSIS — T8131XS Disruption of external operation (surgical) wound, not elsewhere classified, sequela: Secondary | ICD-10-CM | POA: Diagnosis not present

## 2018-05-06 DIAGNOSIS — M6281 Muscle weakness (generalized): Secondary | ICD-10-CM | POA: Diagnosis not present

## 2018-05-06 DIAGNOSIS — D45 Polycythemia vera: Secondary | ICD-10-CM | POA: Diagnosis not present

## 2018-05-06 DIAGNOSIS — R262 Difficulty in walking, not elsewhere classified: Secondary | ICD-10-CM | POA: Diagnosis not present

## 2018-05-06 DIAGNOSIS — T8459XA Infection and inflammatory reaction due to other internal joint prosthesis, initial encounter: Secondary | ICD-10-CM | POA: Diagnosis not present

## 2018-05-06 DIAGNOSIS — M25551 Pain in right hip: Secondary | ICD-10-CM | POA: Diagnosis not present

## 2018-05-06 DIAGNOSIS — S7001XS Contusion of right hip, sequela: Secondary | ICD-10-CM | POA: Diagnosis not present

## 2018-05-06 DIAGNOSIS — T8131XD Disruption of external operation (surgical) wound, not elsewhere classified, subsequent encounter: Secondary | ICD-10-CM | POA: Diagnosis not present

## 2018-05-06 LAB — IRON AND TIBC
IRON: 26 ug/dL — AB (ref 28–170)
SATURATION RATIOS: 15 % (ref 10.4–31.8)
TIBC: 176 ug/dL — AB (ref 250–450)
UIBC: 150 ug/dL

## 2018-05-06 LAB — CBC WITH DIFFERENTIAL/PLATELET
BASOS ABS: 0 10*3/uL (ref 0.0–0.1)
BLASTS: 0 %
Band Neutrophils: 0 %
Basophils Relative: 0 %
Eosinophils Absolute: 0.2 10*3/uL (ref 0.0–0.7)
Eosinophils Relative: 1 %
HEMATOCRIT: 30.8 % — AB (ref 36.0–46.0)
HEMOGLOBIN: 9.3 g/dL — AB (ref 12.0–15.0)
Lymphocytes Relative: 2 %
Lymphs Abs: 0.4 10*3/uL — ABNORMAL LOW (ref 0.7–4.0)
MCH: 25.3 pg — ABNORMAL LOW (ref 26.0–34.0)
MCHC: 30.2 g/dL (ref 30.0–36.0)
MCV: 83.7 fL (ref 78.0–100.0)
MYELOCYTES: 0 %
Metamyelocytes Relative: 0 %
Monocytes Absolute: 0.4 10*3/uL (ref 0.1–1.0)
Monocytes Relative: 2 %
NEUTROS PCT: 95 %
Neutro Abs: 20.6 10*3/uL — ABNORMAL HIGH (ref 1.7–7.7)
Other: 0 %
PROMYELOCYTES RELATIVE: 0 %
Platelets: 433 10*3/uL — ABNORMAL HIGH (ref 150–400)
RBC: 3.68 MIL/uL — AB (ref 3.87–5.11)
RDW: 23.3 % — AB (ref 11.5–15.5)
WBC: 21.6 10*3/uL — AB (ref 4.0–10.5)
nRBC: 0 /100 WBC

## 2018-05-06 LAB — CREATININE, SERUM
CREATININE: 0.95 mg/dL (ref 0.44–1.00)
GFR calc non Af Amer: 56 mL/min — ABNORMAL LOW (ref >60)

## 2018-05-06 LAB — FERRITIN: Ferritin: 47 ng/mL (ref 11–307)

## 2018-05-06 MED ORDER — CEFTAZIDIME IV (FOR PTA / DISCHARGE USE ONLY): 2.0000 g | Freq: Two times a day (BID) | INTRAVENOUS | 0 refills | Status: DC

## 2018-05-06 MED ORDER — HEPARIN SOD (PORK) LOCK FLUSH 100 UNIT/ML IV SOLN
250.0000 [IU] | INTRAVENOUS | Status: AC | PRN
  Administered 2018-05-06: 250 [IU]

## 2018-05-06 MED ORDER — ANAGRELIDE HCL 1 MG PO CAPS: 1.0000 mg | ORAL_CAPSULE | Freq: Two times a day (BID) | ORAL | 1 refills | Status: DC

## 2018-05-06 MED ORDER — VANCOMYCIN IV (FOR PTA / DISCHARGE USE ONLY): 750.0000 mg | INTRAVENOUS | 0 refills | Status: DC

## 2018-05-06 NOTE — Progress Notes (Signed)
Full report was given to [facility], and patient was given a copy of her AVS.  Pt was discharged with a Picc line in R arm capped off and R hip wound vac was taken down and wet to dry applied.

## 2018-05-06 NOTE — Progress Notes (Signed)
CSW following for discharge needs. PT note sent to SNF for insurance to authorize. CSW will inform medical staff when received.   Kathrin Greathouse, Marlinda Mike, MSW Clinical Social Worker  [phone] 05/06/2018  10:57 AM

## 2018-05-06 NOTE — Progress Notes (Signed)
Alejandra [name] had a good weekend.  Her platelet count has come down quite nicely.  Seems to have stabilized around 430,000.  I am going to check her iron studies.  It would not surprise me if her iron levels are low.  If they are, I will give her back some IV iron which should further help her platelets.  The dressing was taken off the right hip.  The hip looks clean and dry.  She has been out of bed.  She is going to the bathroom.  There is no diarrhea.   Her vital signs are stable.  She is afebrile.  Temperature is 98.4.  Pulse 98.  Blood pressure 116/65.  Right now, I will just keep the anagrelide dose the same.  We will see what her iron levels show.  We will continue to follow along.  She is making some nice progress.  Lattie Haw, MD  Jeneen Rinks 1:2-4

## 2018-05-06 NOTE — Consult Note (Addendum)
[facility] Nurse wound consult note Vac dressing change to right hip incision; pt was medicated for pain prior to the procedure and tolerated with minimal discomfort.   Wound type:Right hip with full thickness post-op wound over the middle of the incision. Mod amt bloody drainage in the cannister. Incision site well approximated except for a location in the middle; .3X.3X4cm; too narrow to visualize but it appears to be red and moist with mod amt bloody drainage when swab inserted for assessment. Applied 2 pieces black foam to 178mm cont suction; one to tunneling area and another over the incision line.  Periwound:Intact skin surrounding wound Dressing procedure/placement/frequency:WOC will change Vac dressing Q M/W/F while the patient is in the hospital. Julien Girt MSN, [place], [place], [place], [place]

## 2018-05-06 NOTE — Discharge Summary (Signed)
[name] MRN: [id number] DOB/AGE: [birth year] 77 y.o.  Admit date: 04/23/2018 Discharge date: 05/06/2018  Admission Diagnoses:  Principal Problem:   Postoperative wound dehiscence Active Problems:   Polycythemia vera (HCC)   Hematoma of right hip   History of total hip arthroplasty, right   Malnutrition of moderate degree   Discharge Diagnoses:  Same  Past Medical History:  Diagnosis Date  . Arthritis   . Avascular necrosis of femur head, right ([place]) 04/23/2012  . Cellulitis of shoulder 09/28/2014  . Fracture 10/05/11   "fell and broke right hip"  . Fracture of femoral neck, right ([place]) 10/12/2011  . Glaucoma   . Glaucoma    bilateral  . Hypertension   . Polycythemia vera(238.4)   . Rhabdomyolysis 09/28/2014  . Thrombocytosis ([place]) 04/23/2012    Surgeries: Procedure(s): IRRIGATION AND DEBRIDEMENT EXTREMITY RIGHT HIP WITH POLY EXCHANGE POSTERIOR LATERAL POSITION,stem and head exchange on 04/24/2018   Consultants: Treatment Team:  Ladell Pier, MD Volanda Napoleon, MD  Discharged Condition: Improved  Hospital Course: Alejandra [name] is an 77 y.o. female who was admitted 04/23/2018 for operative treatment ofPostoperative wound dehiscence. Patient has severe unremitting pain that affects sleep, daily activities, and work/hobbies. After pre-op clearance the patient was taken to the operating room on 04/24/2018 and underwent  Procedure(s): IRRIGATION AND DEBRIDEMENT EXTREMITY RIGHT HIP WITH POLY EXCHANGE POSTERIOR LATERAL POSITION,stem and head exchange.    Patient was given perioperative antibiotics:  Anti-infectives (From admission, onward)   Start     Dose/Rate Route Frequency Ordered Stop   05/06/18 0000  cefTAZidime (FORTAZ) IVPB     2 g Intravenous Every 12 hours 05/06/18 1115 06/11/18 2359   05/06/18 0000  vancomycin IVPB     750 mg Intravenous Every 24 hours 05/06/18 1115 06/11/18 2359   05/03/18 2200  vancomycin (VANCOCIN) IVPB 750 mg/150 ml premix      750 mg 150 mL/hr over 60 Minutes Intravenous Every 24 hours 05/02/18 1818     04/26/18 1200  vancomycin (VANCOCIN) 1,250 mg in sodium chloride 0.9 % 250 mL IVPB  Status:  Discontinued     1,250 mg 166.7 mL/hr over 90 Minutes Intravenous Every 48 hours 04/26/18 0952 05/02/18 1818   04/24/18 2000  vancomycin (VANCOCIN) IVPB 1000 mg/200 mL premix  Status:  Discontinued     1,000 mg 200 mL/hr over 60 Minutes Intravenous Every 36 hours 04/24/18 1819 04/26/18 0952   04/24/18 1930  cefTAZidime (FORTAZ) 2 g in sodium chloride 0.9 % 100 mL IVPB     2 g 200 mL/hr over 30 Minutes Intravenous Every 12 hours 04/24/18 1819     04/24/18 1415  ceFAZolin (ANCEF) IVPB 2g/100 mL premix     2 g 200 mL/hr over 30 Minutes Intravenous  Once 04/24/18 1403 04/24/18 1530   04/24/18 0600  ceFAZolin (ANCEF) IVPB 2g/100 mL premix  Status:  Discontinued     2 g 200 mL/hr over 30 Minutes Intravenous On call to O.R. 04/23/18 1227 04/23/18 1744       Patient was given sequential compression devices, early ambulation, and chemoprophylaxis to prevent DVT.  Patient benefited maximally from hospital stay and there were no complications.    Recent vital signs:  Patient Vitals for the past 24 hrs:  BP Temp Temp src Pulse Resp SpO2  05/06/18 1034 (!) 105/57 - - (!) 115 - -  05/06/18 0920 111/64 - - - - -  05/06/18 0500 116/65 98.4 F (36.9 C) Oral  98 16 99 %  05/05/18 2107 121/74 97.8 F (36.6 C) Oral 97 18 100 %  05/05/18 1437 132/69 98.9 F (37.2 C) Oral 89 15 99 %     Recent laboratory studies:  Recent Labs    05/05/18 0407 05/06/18 0311  WBC 23.6* 21.6*  HGB 9.5* 9.3*  HCT 31.3* 30.8*  PLT 428* 433*  CREATININE 0.91 0.95     Discharge Medications:   Allergies as of 05/06/2018   Not on File     Medication List    STOP taking these medications   cephALEXin 500 MG capsule Commonly known as:  Pass [place] these medications   ALPRAZolam 0.25 MG tablet Commonly known as:  XANAX Take 1  tablet (0.25 mg total) by mouth 2 (two) times daily as needed for anxiety.   amLODipine 10 MG tablet Commonly known as:  NORVASC Take 10 mg by mouth daily.   anagrelide 1 MG capsule Commonly known as:  AGRYLIN Take 1 capsule (1 mg total) by mouth 2 (two) times daily.   benazepril 40 MG tablet Commonly known as:  LOTENSIN Take 40 mg by mouth daily.   cefTAZidime  IVPB Commonly known as:  FORTAZ Inject 2 g into the vein every 12 (twelve) hours. Indication:  prosthetic hip infection Last Day of Therapy:  06/06/18 Labs - Biweekly: BMP with GFR Labs - Weekly: CBC/D, CRP, ESR   hydroxyurea 500 MG capsule Commonly known as:  HYDREA Take 1 tablet ('500mg'$ ) every MWF. Take 2 tablets (1,'000mg'$ ) all other days What changed:    how much to take  how to take this  when to take this  additional instructions   latanoprost 0.005 % ophthalmic solution Commonly known as:  XALATAN Place 1 drop into both eyes at bedtime.   loratadine 10 MG tablet Commonly known as:  CLARITIN Take 10 mg by mouth daily as needed for allergies.   meclizine 25 MG tablet Commonly known as:  ANTIVERT Take 25 mg by mouth 3 (three) times daily as needed for dizziness.   oxyCODONE-acetaminophen 5-325 MG tablet Commonly known as:  PERCOCET/ROXICET Take 1 tablet by mouth every 4 (four) hours as needed for severe pain.   rosuvastatin 5 MG tablet Commonly known as:  CRESTOR Take 5 mg by mouth every evening.   tiZANidine 2 MG tablet Commonly known as:  ZANAFLEX Take 1 tablet (2 mg total) by mouth every 6 (six) hours as needed. What changed:    when to take this  reasons to take this   TRAVATAN Z 0.004 % Soln ophthalmic solution Generic drug:  Travoprost (BAK Free) Place 1 drop into both eyes at bedtime.   vancomycin  IVPB Inject 750 mg into the vein daily. Indication:  Prosthetic hip infection Last Day of Therapy:  06/06/18 Labs - Biweekly: BMP with GFR Labs - Weekly: CBC/D, CRP, ESR, and vancomycin  trough            Home Infusion Instuctions  (From admission, onward)         Start     Ordered   05/06/18 0000  Home infusion instructions [facility] May follow [place] Dosing Protocol; May administer Cathflo as needed to maintain patency of vascular access device.; Flushing of vascular access device: per [facility] Protocol: 0.9% NaCl pre/post medica...    Question Answer Comment  Instructions May follow [place] Dosing Protocol   Instructions May administer Cathflo as needed to maintain patency of vascular access device.  Instructions Flushing of vascular access device: per [facility] Protocol: 0.9% NaCl pre/post medication administration and prn patency; Heparin 100 u/ml, 22m for implanted ports and Heparin 10u/ml, 591mfor all other central venous catheters.   Instructions May follow [facility] Anaphylaxis Protocol for First Dose Administration in the home: 0.9% NaCl at 25-50 ml/hr to maintain IV access for protocol meds. Epinephrine 0.3 ml IV/IM PRN and Benadryl 25-50 IV/IM PRN s/s of anaphylaxis.   Instructions [facility] Infusion Coordinator (RN) to assist per patient IV care needs in the home PRN.      05/06/18 1115           Durable Medical Equipment  (From admission, onward)         Start     Ordered   04/24/18 1824  DME Walker rolling  Once    Question:  Patient needs a walker to treat with the following condition  Answer:  Status post right hip replacement   04/24/18 1823   04/24/18 1824  DME 3 n 1  Once     04/24/18 1823          Diagnostic Studies: Ct Pelvis W Contrast  Result Date: 04/23/2018 CLINICAL DATA:  Right hip pain with wound dehiscence. Right arthroplasty revision on 02/04/2018. EXAM: CT PELVIS WITH CONTRAST TECHNIQUE: Multidetector CT imaging of the pelvis was performed using the standard protocol following the bolus administration of intravenous contrast. CONTRAST:  10078mMNIPAQUE IOHEXOL 300 MG/ML  SOLN COMPARISON:  Right hip x-rays dated  April 12, 2018. FINDINGS: Urinary Tract:  No abnormality visualized. Bowel: Unremarkable visualized pelvic bowel loops. Moderate amount of stool in the visualized colon. Vascular/Lymphatic: Aortoiliac atherosclerotic vascular disease. No enlarged pelvic lymph nodes. Reproductive:  No mass or other significant abnormality. Other:  None. Musculoskeletal: Prior right total hip arthroplasty revision. Focal cortical destruction along the lateral aspect of the intertrochanteric femur at the level of the proximal femoral stem. Questionable increased lucency along the femoral stem. There is a large, thick-walled rim enhancing gas and fluid collection along the lateral aspect of the right hip, measuring approximately 5.2 x 2.6 x 11.4 cm (AP by transverse by CC). This collection lies adjacent to the intertrochanteric femur. Moderate to severe osteoarthritis of the left hip. Mild bilateral sacroiliac joint osteoarthritis. Degenerative changes of the lumbar spine. Old left superior pubic ramus fracture. No acute fracture or dislocation. IMPRESSION: 1. Prior total hip arthroplasty revision with large rim enhancing gas and fluid collection along the lateral aspect of the right hip, measuring up to 11.4 cm in craniocaudal dimension. The collection extends to and overlies the lateral and posterior aspect of the right intertrochanteric femur, with an underlying area of focal cortical destruction concerning for osteomyelitis and underlying hardware infection. Electronically Signed   By: WilTitus DubinD.   On: 04/23/2018 16:51   Dg Chest Port 1 View  Result Date: 05/01/2018 CLINICAL DATA:  Persistent cough EXAM: PORTABLE CHEST 1 VIEW COMPARISON:  04/12/2018 FINDINGS: Cardiac shadow is enlarged but stable. Right-sided PICC line is noted in the right atrium. Some patchy infiltrative changes are throughout both lungs but particularly bases bilaterally. No acute bony abnormality is seen. IMPRESSION: Patchy infiltrates  particularly in the bases bilaterally. Electronically Signed   By: MarInez CatalinaD.   On: 05/01/2018 16:28   Dg Chest Port 1 View  Result Date: 04/12/2018 CLINICAL DATA:  Weakness and shortness of breath. EXAM: PORTABLE CHEST 1 VIEW COMPARISON:  Two-view chest x-ray 01/29/2018 FINDINGS: Heart is enlarged. There  is no edema or effusion. Aortic atherosclerosis is present. Changes of COPD are again noted. Advanced degenerative changes are present at both shoulders. IMPRESSION: 1. Stable cardiomegaly without failure. 2. Aortic atherosclerosis. 3. Advanced degenerative changes involve the shoulders bilaterally. Electronically Signed   By: San Morelle M.D.   On: 04/12/2018 11:28   Dg Hip Unilat  With Pelvis 2-3 Views Right  Result Date: 04/12/2018 CLINICAL DATA:  Fall.  History of hip replacement EXAM: DG HIP (WITH OR WITHOUT PELVIS) 2-3V RIGHT COMPARISON:  Fluoroscopy from 02/04/2018 FINDINGS: Total right hip arthroplasty with recent revision. The prosthesis appears well seated. No periprosthetic fracture or dislocation. Remote trauma to the right obturator ring. No evidence of acute pelvic ring fracture. There is gas lateral to the right hip, status post incision drainage yesterday per report. Advanced left hip osteoarthritis. IMPRESSION: No acute finding. Electronically Signed   By: Monte Fantasia M.D.   On: 04/12/2018 10:48   [place] Ekg Site Rite  Result Date: 04/28/2018 If Site Rite image not attached, placement could not be confirmed due to current cardiac rhythm.   Disposition:   Discharge Instructions    Home infusion instructions [facility] May follow [place] Dosing Protocol; May administer Cathflo as needed to maintain patency of vascular access device.; Flushing of vascular access device: per [facility] Protocol: 0.9% NaCl pre/post medica...   Complete by:  As directed    Instructions:  May follow [place] Dosing Protocol   Instructions:  May administer Cathflo as needed to  maintain patency of vascular access device.   Instructions:  Flushing of vascular access device: per [facility] Protocol: 0.9% NaCl pre/post medication administration and prn patency; Heparin 100 u/ml, 61m for implanted ports and Heparin 10u/ml, 599mfor all other central venous catheters.   Instructions:  May follow [facility] Anaphylaxis Protocol for First Dose Administration in the home: 0.9% NaCl at 25-50 ml/hr to maintain IV access for protocol meds. Epinephrine 0.3 ml IV/IM PRN and Benadryl 25-50 IV/IM PRN s/s of anaphylaxis.   Instructions:  [facility] Coord[facility] (RN) to assist per patient IV care needs in the home PRN.       Contact information for follow-up providers    RoFrederik PearMD In 2 weeks.   Specialty:  Orthopedic Surgery Contact information: [phone]            Contact information for after-discharge care    [facility]   Service:  Skilled Nursing Contact information: [address] [phone]                 Signed: ErJoanell Rising/23/2019, 11:15 AM

## 2018-05-06 NOTE — Progress Notes (Signed)
Physical Therapy Treatment Patient Details Name: Alejandra [name] MRN: [id number] DOB: [birth year] Today's Date: 05/06/2018    History of Present Illness 77 yo female adm with  right hip wound dehisence, hematoma-->s/p  IRRIGATION AND DEBRIDEMENT EXTREMITY RIGHT HIP WITH POLY EXCHANGE; PMH: Polycythemia vera, HTN, multiple R hip surgeries, most recent revision in  June of 2019    PT Comments    POD # 12 Pt progressing slowly and will need ST Rehab at [facility].  See mobility details below.   Follow Up Recommendations  SNF     Equipment Recommendations  None recommended by PT    Recommendations for Other Services       Precautions / Restrictions Precautions Precautions: Fall;Posterior Hip Precaution Comments: pt able to verbalize 3/3 THP Restrictions Weight Bearing Restrictions: No Other Position/Activity Restrictions: WBAT    Mobility  Bed Mobility Overal bed mobility: Needs Assistance Bed Mobility: Supine to Sit     Supine to sit: Max assist;+2 for physical assistance;+2 for safety/equipment     General bed mobility comments: required increased assist this session due to tremors throughout and c/o fatigue.  Severe posterior lean EOB   Transfers Overall transfer level: Needs assistance Equipment used: Rolling walker (2 wheeled) Transfers: Sit to/from [facility] Sit to Stand: Mod assist;Max assist;+2 physical assistance;+2 safety/equipment Stand pivot transfers: Mod assist;Max assist;+2 physical assistance;+2 safety/equipment       General transfer comment: assisted to [facility] with difficulty due to weakness, c/o fatigue and tyremors.    Ambulation/Gait Ambulation/Gait assistance: Mod assist;Max assist;+2 physical assistance;+2 safety/equipment Gait Distance (Feet): 2 Feet Assistive device: Rolling walker (2 wheeled) Gait Pattern/deviations: Step-through pattern;Decreased stride length;Drifts right/left;Trunk flexed     General Gait Details: attempted  however unable past 2 feet due to c/o dizziness, weakness, fatigue and tremors.    Stairs             Wheelchair Mobility    Modified Rankin (Stroke Patients Only)       Balance                                            Cognition Arousal/Alertness: Awake/alert Behavior During Therapy: WFL for tasks assessed/performed Overall Cognitive Status: Within Functional Limits for tasks assessed                                 General Comments: feels "tired"      Exercises      General Comments        Pertinent Vitals/Pain Pain Assessment: Faces Faces Pain Scale: Hurts a little bit Pain Location: "It's okay"  (R hip)     Home Living                      Prior Function            PT Goals (current goals can now be found in the care plan section) Progress towards PT goals: Progressing toward goals    Frequency    Min 2X/week      PT Plan Current plan remains appropriate    Co-evaluation              AM-PAC PT "6 Clicks" Daily Activity  Outcome Measure  Difficulty turning over in bed (including adjusting bedclothes, sheets and blankets)?: A Lot Difficulty  moving from lying on back to sitting on the side of the bed? : A Lot Difficulty sitting down on and standing up from a chair with arms (e.g., wheelchair, bedside commode, etc,.)?: A Lot Help needed moving to and from a bed to chair (including a wheelchair)?: A Lot Help needed walking in hospital room?: A Lot Help needed climbing 3-5 steps with a railing? : A Lot 6 Click Score: 12    End of Session Equipment Utilized During Treatment: Gait belt Activity Tolerance: Other (comment) Patient left: in chair;with call bell/phone within reach Nurse Communication: Mobility status PT Visit Diagnosis: Other abnormalities of gait and mobility (R26.89);History of falling (Z91.81)     Time: 4128-2081 PT Time Calculation (min) (ACUTE ONLY): 27 min  Charges:   $Gait Training: 8-22 mins $Therapeutic Activity: 8-22 mins                     Rica Koyanagi  PTA Acute  Rehabilitation Services Pager      [phone] Office      [phone]

## 2018-05-06 NOTE — Clinical Social Work Placement (Addendum)
D/C summary sent.  Nurse call report to [phone] Room 111 PTAR arranged for transport.  CLINICAL SOCIAL WORK PLACEMENT  NOTE  Date:  05/06/2018  Patient Details  Name: Alejandra [name] MRN: [id number] Date of Birth: [birth year]  Clinical Social Work is seeking post-discharge placement for this patient at the [place] level of care (*CSW will initial, date and re-position this form in  chart as items are completed):  Yes   Patient/family provided with [facility] Work Department's list of facilities offering this level of care within the geographic area requested by the patient (or if unable, by the patient's family).  Yes   Patient/family informed of their freedom to choose among providers that offer the needed level of care, that participate in Medicare, Medicaid or managed care program needed by the patient, have an available bed and are willing to accept the patient.  Yes   Patient/family informed of [facility]'s ownership interest in [facility] and [facility], as well as of the fact that they are under no obligation to receive care at these facilities.  PASRR submitted to EDS on       PASRR number received on       Existing PASRR number confirmed on 04/15/18     FL2 transmitted to all facilities in geographic area requested by pt/family on 04/15/18     FL2 transmitted to all facilities within larger geographic area on       Patient informed that his/her managed care company has contracts with or will negotiate with certain facilities, including the following:        Yes   Patient/family informed of bed offers received.  Patient chooses bed at   [facility] recommends and patient chooses bed at     Patient to be transferred to   on 05/06/18. [facility]  Patient to be transferred to facility by [facility]     Patient family notified on 05/06/18 of transfer.  Name of family member notified:  [facility]  [place]     PHYSICIAN       Additional Comment:    _______________________________________________ Lia Hopping, [place] 05/06/2018, 11:40 AM

## 2018-05-06 NOTE — Progress Notes (Signed)
[name]  MRN: [id number]  DOB/AGE:  Mar 15, 1941 / 77 y.o.  12 Days Post-Op Procedure(s) (LRB): IRRIGATION AND DEBRIDEMENT EXTREMITY RIGHT HIP WITH POLY EXCHANGE POSTERIOR LATERAL POSITION,stem and head exchange (Right)    PROGRESS NOTE Subjective: Patient is alert, oriented, no Nausea, no Vomiting, yes passing gas, . Taking PO well. Denies SOB, Chest or Calf Pain. Ambulate in hallway, reported wek Patient reports pain as  0/10.  Discussed resection arthroplasty, patient does not want to do this if at all possible.  Objective: Vital signs in last 24 hours: Vitals:   05/05/18 1053 05/05/18 1437 05/05/18 2107 05/06/18 0500  BP: 138/77 132/69 121/74 116/65  Pulse: 95 89 97 98  Resp:  15 18 16   Temp:  98.9 F (37.2 C) 97.8 F (36.6 C) 98.4 F (36.9 C)  TempSrc:  Oral Oral Oral  SpO2: 100% 99% 100% 99%  Weight:      Height:          Intake/Output from previous day: I/O last 3 completed shifts: In: 1586.3 [P.O.:780; I.V.:152; IV Piggyback:654.4] Out: 2000 [Urine:1850; Drains:150]   Intake/Output this shift: Total I/O In: 373.4 [IV Piggyback:373.4] Out: 300 [Urine:300]   LABORATORY DATA: Recent Labs    05/05/18 0407 05/06/18 0311  WBC 23.6* 21.6*  HGB 9.5* 9.3*  HCT 31.3* 30.8*  PLT 428* 433*  CREATININE 0.91 0.95    Examination: Neurologically intact ABD soft Neurovascular intact Sensation intact distally Intact pulses distally Dorsiflexion/Plantar flexion intact Incision: moderate drainage No cellulitis present Compartment soft} Palpation of the thigh reveals no tenderness the skin is soft.  The VAC is in place and seems to be functioning well when I looked at the drain canister there was 50 cc of dark bloody fluid.  Assessment:   12 Days Post-Op Procedure(s) (LRB): IRRIGATION AND DEBRIDEMENT EXTREMITY RIGHT HIP WITH POLY EXCHANGE POSTERIOR LATERAL POSITION,stem and head exchange (Right) ADDITIONAL DIAGNOSIS:  Expected Acute Blood Loss Anemia,  polycythemia vera tri- clonal for blood clotting  Plan: PT/OT WBAT, THA posterior hip precautions, continue VAC dressing, continue IV antibiotics for a total of 6 weeks  DVT Prophylaxis: No chemoprophylaxis as patient has long-term bleeding disorder.  Hematology management per Dr. Ammie Dalton  DISCHARGE PLAN: Skilled Nursing Facility/Rehab  DISCHARGE NEEDS: HHPT, Walker and 3-in-1 comode seat, IV vancomycin for 4 more weeks.  Patient will follow-up in my clinic in 1 week.  If her drainage increases she will be scheduled for resection arthroplasty.

## 2018-05-07 DIAGNOSIS — D45 Polycythemia vera: Secondary | ICD-10-CM | POA: Diagnosis not present

## 2018-05-07 DIAGNOSIS — S7001XS Contusion of right hip, sequela: Secondary | ICD-10-CM | POA: Diagnosis not present

## 2018-05-07 DIAGNOSIS — I10 Essential (primary) hypertension: Secondary | ICD-10-CM | POA: Diagnosis not present

## 2018-05-07 DIAGNOSIS — M25551 Pain in right hip: Secondary | ICD-10-CM | POA: Diagnosis not present

## 2018-05-08 DIAGNOSIS — T8131XS Disruption of external operation (surgical) wound, not elsewhere classified, sequela: Secondary | ICD-10-CM | POA: Diagnosis not present

## 2018-05-08 DIAGNOSIS — S7001XS Contusion of right hip, sequela: Secondary | ICD-10-CM | POA: Diagnosis not present

## 2018-05-08 DIAGNOSIS — I10 Essential (primary) hypertension: Secondary | ICD-10-CM | POA: Diagnosis not present

## 2018-05-08 DIAGNOSIS — D45 Polycythemia vera: Secondary | ICD-10-CM | POA: Diagnosis not present

## 2018-05-15 DIAGNOSIS — T8131XS Disruption of external operation (surgical) wound, not elsewhere classified, sequela: Secondary | ICD-10-CM | POA: Diagnosis not present

## 2018-05-15 DIAGNOSIS — D45 Polycythemia vera: Secondary | ICD-10-CM | POA: Diagnosis not present

## 2018-05-15 DIAGNOSIS — I10 Essential (primary) hypertension: Secondary | ICD-10-CM | POA: Diagnosis not present

## 2018-05-15 DIAGNOSIS — S7001XS Contusion of right hip, sequela: Secondary | ICD-10-CM | POA: Diagnosis not present

## 2018-05-17 ENCOUNTER — Inpatient Hospital Stay: Payer: Medicare HMO

## 2018-05-17 ENCOUNTER — Encounter: Payer: Self-pay | Admitting: Nurse Practitioner

## 2018-05-17 ENCOUNTER — Inpatient Hospital Stay: Payer: Medicare HMO | Attending: Nurse Practitioner | Admitting: Nurse Practitioner

## 2018-05-17 ENCOUNTER — Telehealth: Payer: Self-pay | Admitting: Oncology

## 2018-05-17 VITALS — BP 112/89 | HR 104 | Temp 98.1°F | Resp 18 | Ht 64.0 in

## 2018-05-17 DIAGNOSIS — Z96641 Presence of right artificial hip joint: Secondary | ICD-10-CM

## 2018-05-17 DIAGNOSIS — I10 Essential (primary) hypertension: Secondary | ICD-10-CM

## 2018-05-17 DIAGNOSIS — Z79899 Other long term (current) drug therapy: Secondary | ICD-10-CM | POA: Diagnosis not present

## 2018-05-17 DIAGNOSIS — D45 Polycythemia vera: Secondary | ICD-10-CM | POA: Diagnosis not present

## 2018-05-17 LAB — CBC WITH DIFFERENTIAL (CANCER CENTER ONLY)
BASOS ABS: 0.3 10*3/uL — AB (ref 0.0–0.1)
BASOS PCT: 3 %
Eosinophils Absolute: 0 10*3/uL (ref 0.0–0.5)
Eosinophils Relative: 0 %
HCT: 33.4 % — ABNORMAL LOW (ref 34.8–46.6)
Hemoglobin: 10.1 g/dL — ABNORMAL LOW (ref 11.6–15.9)
Lymphocytes Relative: 5 %
Lymphs Abs: 0.5 10*3/uL — ABNORMAL LOW (ref 0.9–3.3)
MCH: 24.3 pg — ABNORMAL LOW (ref 25.1–34.0)
MCHC: 30.2 g/dL — AB (ref 31.5–36.0)
MCV: 80.5 fL (ref 79.5–101.0)
Monocytes Absolute: 0.6 10*3/uL (ref 0.1–0.9)
Monocytes Relative: 5 %
Neutro Abs: 9.5 10*3/uL — ABNORMAL HIGH (ref 1.5–6.5)
Neutrophils Relative %: 87 %
Platelet Count: 195 10*3/uL (ref 145–400)
RBC: 4.15 MIL/uL (ref 3.70–5.45)
RDW: 23.6 % — ABNORMAL HIGH (ref 11.2–14.5)
WBC: 10.9 10*3/uL — AB (ref 3.9–10.3)

## 2018-05-17 NOTE — Progress Notes (Addendum)
[facility] OFFICE PROGRESS NOTE   Diagnosis: Polycythemia vera  INTERVAL HISTORY:   Alejandra [name] returns for follow-up.  She was hospitalized 04/23/2018 through 05/06/2018 with wound dehiscence.  She underwent irrigation and debridement of the right hip arthroplasty with revision of the hip hardware on 04/24/2018.  The platelet count was markedly elevated.  She was started on anagrelide with improvement in the platelet count.  At the time of discharge on 05/06/2018 hemoglobin was 9.3, white count 21.6, platelet count 433,000.  She overall is feeling better.  She is working with physical therapy at the nursing facility.  She denies bleeding.  She continues to have the wound VAC.  Cough is better.  She denies shortness of breath.  Appetite varies.  She had several episodes of nausea/vomiting/diarrhea last night.  Objective:  Vital signs in last 24 hours:  Blood pressure 112/89, pulse (!) 104, temperature 98.1 F (36.7 C), temperature source Oral, resp. rate 18, height 5\' 4"  (1.626 m), SpO2 96 %.    Resp: Lungs clear bilaterally. Cardio: Regular rate and rhythm. GI: Abdomen soft and nontender.  No hepatosplenomegaly. Vascular: No leg edema. Neuro: Alert and oriented. Skin: Wound VAC right hip region.  Bloody drainage in the collection canister. Right upper extremity PICC without erythema.  Lab Results:  Lab Results  Component Value Date   WBC 10.9 (H) 05/17/2018   HGB 10.1 (L) 05/17/2018   HCT 33.4 (L) 05/17/2018   MCV 80.5 05/17/2018   PLT 195 05/17/2018   NEUTROABS 9.5 (H) 05/17/2018    Imaging:  No results found.  Medications: I have reviewed the patient's current medications.  Assessment/Plan: 1. Polycythemia vera on hydroxyurea. Last phlebotomy 09/08/2015  Hydroxyurea dose increased to 1500 mg daily 06/26/2016  Hydroxyurea changed to 1500 mg daily on Monday through Friday, 1000 mg on Saturday and Sunday beginning 09/11/2016  Hydroxyurea changed to  1500 mg Monday Wednesday and Friday and 1000 mg other days beginning 10/10/2016  Hydroxyurea changed to 1000 mg daily beginning 10/31/2016  Hydroxyurea placed on hold beginning 11/27/2016-she misunderstood our instructions and continued 1000 mg daily.  Hydroxyurea dose decreased to 500 mg daily beginning 01/01/2017  Hydroxyurea dose increased 06/05/2017  Hydroxyurea dose increased to 1000 mg daily except for 500 mg on Monday, Wednesday, and Friday, 11/01/2017  Hydroxyurea discontinued 04/25/2018  Anagrelide started 04/25/2018 2. Hypertension, followed by Dr. Montez Morita.  3. History of hyperpigmentation at the hands. Question related to hydroxyurea. 4. Hip fracture February 2013. She underwent a bipolar hip arthroplasty on 04/23/2012. 5. Thrombocytosis. Most likely secondary to polycythemia vera and iron deficiency.  Improved 6. Hospitalization with left shoulder cellulitis February 2016. 7. Pelvic fracture after a fall 05/31/2015 8. Weight loss-etiology unclear; weight stable 12/16/2015 and06/15/2017 9. Right anterior buccal ulcer 03/09/2016 10. Revision of right hip arthroplasty 05/07/2682, complicated by intraoperative and postoperative bleeding requiring multiple red cell transfusions 11. Swelling at the right hip surgical site week of 04/08/2018- 220 cc bloody fluid aspirated from the right hip surgical site 04/11/2018  Right thigh wound dehiscence prompting readmission 04/23/2018  CT 04/23/2018- gas/fluid collection at the lateral right hip with area of focal cortical destruction concerning for infection  Irrigation/debridement and replacement of right hip hardware 04/24/2018 12.  Coagulopathy 13.  Anemia secondary to surgical blood loss 14.  Fever/cough, chest x-ray 05/01/2018 suggestive of pneumonia   Disposition: Ms. Deuser is stable from a hematologic standpoint.  Upon review of her medication list from the nursing facility she appears to be taking both  anagrelide and Hydrea.  The  Hydrea will be discontinued.  She will continue anagrelide.  She will return for a CBC in 2 weeks.  CBC and follow-up visit in 4 weeks.  She will follow-up with orthopedics as scheduled.  Patient seen with Dr. Benay Spice.    Ned Card ANP/GNP-BC   05/17/2018  3:58 PM  This was a shared visit with Ned Card.  Ms. Neubert was interviewed and examined.  We discontinued hydroxyurea.  She will return for a CBC in 2 weeks and an office visit in 4 weeks.  She continues rehabilitation following the recent hip revision surgery.  Julieanne Manson, MD

## 2018-05-17 NOTE — Telephone Encounter (Signed)
Appts scheduled avs/calendar printed per 10/4 los °

## 2018-05-22 DIAGNOSIS — T8131XS Disruption of external operation (surgical) wound, not elsewhere classified, sequela: Secondary | ICD-10-CM | POA: Diagnosis not present

## 2018-05-22 DIAGNOSIS — D45 Polycythemia vera: Secondary | ICD-10-CM | POA: Diagnosis not present

## 2018-05-22 DIAGNOSIS — S7001XS Contusion of right hip, sequela: Secondary | ICD-10-CM | POA: Diagnosis not present

## 2018-05-22 DIAGNOSIS — I10 Essential (primary) hypertension: Secondary | ICD-10-CM | POA: Diagnosis not present

## 2018-05-23 DIAGNOSIS — M25551 Pain in right hip: Secondary | ICD-10-CM | POA: Diagnosis not present

## 2018-05-23 DIAGNOSIS — T84090A Other mechanical complication of internal right hip prosthesis, initial encounter: Secondary | ICD-10-CM | POA: Diagnosis not present

## 2018-05-27 DIAGNOSIS — S7001XS Contusion of right hip, sequela: Secondary | ICD-10-CM | POA: Diagnosis not present

## 2018-05-27 DIAGNOSIS — D45 Polycythemia vera: Secondary | ICD-10-CM | POA: Diagnosis not present

## 2018-05-27 DIAGNOSIS — T8131XS Disruption of external operation (surgical) wound, not elsewhere classified, sequela: Secondary | ICD-10-CM | POA: Diagnosis not present

## 2018-05-27 DIAGNOSIS — I10 Essential (primary) hypertension: Secondary | ICD-10-CM | POA: Diagnosis not present

## 2018-05-28 ENCOUNTER — Telehealth: Payer: Self-pay

## 2018-05-28 ENCOUNTER — Ambulatory Visit (INDEPENDENT_AMBULATORY_CARE_PROVIDER_SITE_OTHER): Payer: Medicare HMO | Admitting: Internal Medicine

## 2018-05-28 ENCOUNTER — Encounter: Payer: Self-pay | Admitting: Internal Medicine

## 2018-05-28 DIAGNOSIS — Z96649 Presence of unspecified artificial hip joint: Secondary | ICD-10-CM | POA: Diagnosis not present

## 2018-05-28 DIAGNOSIS — D45 Polycythemia vera: Secondary | ICD-10-CM | POA: Diagnosis not present

## 2018-05-28 DIAGNOSIS — T8131XD Disruption of external operation (surgical) wound, not elsewhere classified, subsequent encounter: Secondary | ICD-10-CM

## 2018-05-28 DIAGNOSIS — T8459XA Infection and inflammatory reaction due to other internal joint prosthesis, initial encounter: Secondary | ICD-10-CM | POA: Insufficient documentation

## 2018-05-28 DIAGNOSIS — I158 Other secondary hypertension: Secondary | ICD-10-CM | POA: Diagnosis not present

## 2018-05-28 DIAGNOSIS — T8459XD Infection and inflammatory reaction due to other internal joint prosthesis, subsequent encounter: Secondary | ICD-10-CM

## 2018-05-28 MED ORDER — AMOXICILLIN-POT CLAVULANATE 500-125 MG PO TABS: 1.0000 | ORAL_TABLET | Freq: Two times a day (BID) | ORAL | Status: DC

## 2018-05-28 MED ORDER — CEFTAZIDIME IV (FOR PTA / DISCHARGE USE ONLY): 2.0000 g | Freq: Two times a day (BID) | INTRAVENOUS | 0 refills | Status: DC

## 2018-05-28 NOTE — Assessment & Plan Note (Signed)
It is somewhat difficult to know how she is doing on empiric therapy for probable prosthetic hip infection.  She is not able to tell me much about her recent progress and I do not have any lab results from her nursing home.  She will complete 6 weeks of IV antibiotic therapy on 06/06/2018 and transition to oral amoxicillin clavulanate.  I plan on a minimum of 3 months of total antibiotic therapy.

## 2018-05-28 NOTE — Progress Notes (Signed)
[facility] for Infectious Disease  Patient Active Problem List   Diagnosis Date Noted  . 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 Postoperative wound dehiscence 04/25/2018    Priority: High  . History of total hip arthroplasty, right 04/23/2018    Priority: High  . Hematoma of right hip 04/15/2018    Priority: High  . Malnutrition of moderate degree 04/24/2018  . Right hip pain 04/13/2018  . Fall at home, initial encounter 04/13/2018  . AKI (acute kidney injury) ([place]) 04/13/2018  . Elevated troponin 04/13/2018  . Possible Acute lower UTI 04/13/2018  . Sepsis ([place]) 04/12/2018  . UTI (urinary tract infection) 04/12/2018  . Primary osteoarthritis of right hip 02/04/2018  . 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 Pre-operative cardiovascular examination 01/31/2018  . Abnormal EKG 01/31/2018  . Vertigo 09/28/2014  . Essential hypertension 09/28/2014  . Thrombocytosis ([place]) 04/23/2012  . Polycythemia vera ([place]) 12/22/2011    Patient's Medications  New Prescriptions   No medications on file  Previous Medications   ALPRAZOLAM (XANAX) 0.25 MG TABLET    Take 1 tablet (0.25 mg total) by mouth 2 (two) times daily as needed for anxiety.   AMLODIPINE (NORVASC) 10 MG TABLET    Take 10 mg by mouth daily.   ANAGRELIDE (AGRYLIN) 1 MG CAPSULE    Take 1 capsule (1 mg total) by mouth 2 (two) times daily.   BENAZEPRIL (LOTENSIN) 40 MG TABLET    Take 40 mg by mouth daily.   HYDROXYUREA (HYDREA) 500 MG CAPSULE    Take 1 tablet ('500mg'$ ) every MWF. Take 2 tablets (1,'000mg'$ ) all other days   LATANOPROST (XALATAN) 0.005 % OPHTHALMIC SOLUTION    Place 1 drop into both eyes at bedtime.    LORATADINE (CLARITIN) 10 MG TABLET    Take 10 mg by mouth daily as needed for allergies.   MECLIZINE (ANTIVERT) 25 MG TABLET    Take 25 mg by mouth 3 (three) times daily as needed for dizziness.    OXYCODONE-ACETAMINOPHEN (PERCOCET/ROXICET) 5-325 MG TABLET    Take 1  tablet by mouth every 4 (four) hours as needed for severe pain.   ROSUVASTATIN (CRESTOR) 5 MG TABLET    Take 5 mg by mouth every evening.   TIZANIDINE (ZANAFLEX) 2 MG TABLET    Take 1 tablet (2 mg total) by mouth every 6 (six) hours as needed.   TRAVATAN Z 0.004 % SOLN OPHTHALMIC SOLUTION    Place 1 drop into both eyes at bedtime.   VANCOMYCIN IVPB    Inject 750 mg into the vein daily. Indication:  Prosthetic hip infection Last Day of Therapy:  06/06/18 Labs - Biweekly: BMP with GFR Labs - Weekly: CBC/D, CRP, ESR, and vancomycin trough  Modified Medications   Modified Medication Previous Medication   CEFTAZIDIME (FORTAZ) IVPB cefTAZidime (FORTAZ) IVPB      Inject 2 g into the vein every 12 (twelve) hours. Indication:  prosthetic hip infection Last Day of Therapy:  06/06/18 Labs - Biweekly: BMP with GFR Labs - Weekly: CBC/D, CRP, ESR    Inject 2 g into the vein every 12 (twelve) hours. Indication:  prosthetic hip infection Last Day of Therapy:  06/06/18 Labs - Biweekly: BMP with GFR Labs - Weekly: CBC/D, CRP, ESR  Discontinued Medications   No medications on file    Subjective: Alejandra [name] is in for Alejandra [name] hospital follow-up visit.  Alejandra [name] is accompanied by Alejandra [name]  brother and sister.  Alejandra [name] had undergone previous right hip hemiarthroplasty in 2013 and developed loosening of the stem.  Alejandra [name] underwent revision right total hip arthroplasty on 02/04/2018.  Operative Gram stain showed no organisms and cultures were negative.  Alejandra [name] was not treated with any postoperative antibiotics.  Alejandra [name] also has polycythemia vera.  Postoperatively Alejandra [name] had oozing from Alejandra [name] incision.  Alejandra [name] developed acute swelling of Alejandra [name] right hip and was seen by Dr. Hal Morales on 04/11/2018. He aspirated 220 cc of bloody fluid.  The white blood cell count was 13,000 with 90% segmented neutrophils.  Gram stain showed no organisms and records indicate that cultures were negative.  Alejandra [name] fell out of bed and landed on Alejandra [name] right hip Alejandra [name] was found by Alejandra [name] sister  and admitted on 04/12/2017.  There were no acute findings on hip x-ray.  The hip was aspirated again and 50 cc of bloody fluid were obtained.  The fluid was not cultured.  Alejandra [name] was afebrile.  Urinalysis showed pyuria and urine culture grew E. coli.  Alejandra [name] was treated with ceftriaxone and then transition to cephalexin.  Alejandra [name] was discharged on cephalexin on 04/17/2018 to a skilled nursing facility.  Neither Alejandra [name] nor Alejandra [name] sister are certain how long Alejandra [name] was on cephalexin.  Alejandra [name] continued to have wound drainage and developed proximal wound dehiscence leading to readmission on 04/23/2018.  CT scan revealed the following:   Impression: Prior total hip arthroplasty revision with large rim enhancing gas and fluid collection along the lateral aspect of the right hip, measuring up to 11.4 cm in craniocaudal dimension. The collection extends to and overlies the lateral and posterior aspect of the right intertrochanteric femur, with an underlying area of focal cortical destruction concerning for osteomyelitis and underlying hardware infection.  Alejandra [name] underwent incision and drainage with exchange of the femoral head, stem liner.  Dr. Mayer Camel reported that the wound appeared "inflamed".  Operative Gram stain showed no white blood cells and no organisms.  Alejandra [name] was discharged on IV vancomycin and ceftazidime.  After discharge Alejandra [name] cultures were final-showing normal skin flora.  Alejandra [name] has not had any problems tolerating Alejandra [name] antibiotics or PICC.  Alejandra [name] is unable to tell me if Alejandra [name] is much better.  It does not sound like Alejandra [name] is making much progress with physical therapy.  Alejandra [name] denies significant pain.  Review of Systems: Review of Systems  Constitutional: Negative for chills, diaphoresis and fever.  Gastrointestinal: Negative for abdominal pain, diarrhea, nausea and vomiting.  Musculoskeletal: Positive for joint pain.    Past Medical History:  Diagnosis Date  . Arthritis   . Avascular necrosis of femur head, right ([place]) 04/23/2012    . Cellulitis of shoulder 09/28/2014  . Fracture 10/05/11   "fell and broke right hip"  . Fracture of femoral neck, right ([place]) 10/12/2011  . Glaucoma   . Glaucoma    bilateral  . Hypertension   . Polycythemia vera(238.4)   . Rhabdomyolysis 09/28/2014  . Thrombocytosis ([place]) 04/23/2012    Social History   Tobacco Use  . Smoking status: Former Smoker    Packs/day: 0.12    Types: Cigarettes    Last attempt to quit: 08/15/1991    Years since quitting: 26.8  . Smokeless tobacco: Never Used  Substance Use Topics  . Alcohol use: No    Comment: 10/12/11 "did drink in my younger; don't drink at all now"  . Drug use: No    Family History  Problem Relation Age of Onset  . Cancer Mother   .  Hypertension Father   . Asthma Sister     Not on File  Objective: Vitals:   05/28/18 1124  BP: (!) 161/85  Pulse: 96  Temp: 98.6 F (37 C)   There is no height or weight on file to calculate BMI.  Physical Exam  Constitutional:  Alejandra [name] is very weak, slightly confused and forgetful.  Alejandra [name] sister frequently corrects Alejandra [name] or answers for Alejandra [name].  Musculoskeletal:  With maximal assistance we were able to stand Alejandra [name] and look at Alejandra [name] right hip incision.  Alejandra [name] still has a VAC wound dressing in place.  Neurological: Alejandra [name] is alert.  Skin: No rash noted.  Alejandra [name] right arm PICC site appears normal.  Psychiatric: Alejandra [name] has a normal mood and affect.    Lab Results    Problem List Items Addressed This Visit      High   Prosthetic hip infection [facility])    It is somewhat difficult to know how Alejandra [name] is doing on empiric therapy for probable prosthetic hip infection.  Alejandra [name] is not able to tell me much about Alejandra [name] recent progress and I do not have any lab results from Alejandra [name] nursing home.  Alejandra [name] will complete 6 weeks of IV antibiotic therapy on 06/06/2018 and transition to oral amoxicillin clavulanate.  I plan on a minimum of 3 months of total antibiotic therapy.      Relevant Medications   cefTAZidime (FORTAZ) IVPB    Postoperative wound dehiscence       Michel Bickers, MD [facility] [phone] pager   [phone] cell 05/28/2018, 12:08 PM

## 2018-05-28 NOTE — Telephone Encounter (Signed)
Per Dr.Campbell, faxed a medical release of information to [facility] on 05/28/2018, to obtain most recent lab results. Received verification of fax transmission.  Lenore Cordia

## 2018-05-30 ENCOUNTER — Encounter (HOSPITAL_COMMUNITY): Payer: Self-pay | Admitting: *Deleted

## 2018-05-30 ENCOUNTER — Other Ambulatory Visit: Payer: Self-pay | Admitting: Orthopedic Surgery

## 2018-05-30 DIAGNOSIS — M25551 Pain in right hip: Secondary | ICD-10-CM | POA: Diagnosis not present

## 2018-05-30 NOTE — Progress Notes (Addendum)
Preop instructions for: Alejandra [name]                          Date of Birth: Jun 22, 1941                             Date of Procedure:06/03/2018        Doctor:DR Frederik Pear  Time to arrive at [facility]:1230pm Report to: Admitting  Procedure: right total hip arthroplasty resection with removal /replacement of wound vac system  Any procedure time changes, MD office will notify you!   Do not eat  past midnight the night before your procedure.(To include any tube feedings-must be discontinued) May have clear liquids from 12 midnite until 0900am morning of surgery then nothing by mouth.      CLEAR LIQUID DIET   Foods Allowed                                                                     Foods Excluded  Coffee and tea, regular and decaf                             liquids that you cannot  Plain Jell-O in any flavor                                             see through such as: Fruit ices (not with fruit pulp)                                     milk, soups, orange juice  Iced Popsicles                                    All solid food Carbonated beverages, regular and diet                                    Cranberry, grape and apple juices Sports drinks like Gatorade Lightly seasoned clear broth or consume(fat free) Sugar, honey syrup  Sample Menu Breakfast                                Lunch                                     Supper Cranberry juice                    Beef broth                            Chicken broth Jell-O  Grape juice                           Apple juice Coffee or tea                        Jell-O                                      Popsicle                                                Coffee or tea                        Coffee or tea  _____________________________________________________________________    Take these morning medications only with sips of water.(or give through gastrostomy or  feeding tube).  Xanax if needed, Amlodipine ( Norvasc), Agrylin, Hydrea, Oxycodone if needed, Fortaz as usual, Vancomycin as usual, Augmentin    Facility contact: [facility]                  Phone:  [place] POA: sister- Alejandra [name]   Transportation contact phone#: [facility] [place]  Please send day of procedure:current med list and meds last taken that day, confirm nothing by mouth status from what time, Patient Demographic info( to include DNR status, problem list, allergies)   RN contact name/phone#:  Gillian Shields RN                            and Fax [phone]  [place] card and picture ID Leave all jewelry and other valuables at place where living( no metal or rings to be worn) No contact lens Women-no make-up, no lotions,perfumes,powders   Any questions day of procedure,call [place]- [phone]    Sent from :[facility]                   Phone:[phone]                   Fax:[phone]  Sent by :

## 2018-05-30 NOTE — H&P (Signed)
TOTAL HIP REVISION ADMISSION H&P  Patient is admitted for right resection arthroplasty.  Subjective:  Chief Complaint: right hip pain  HPI: Alejandra [name], 77 y.o. female, has a history of pain and functional disability in the right hip due to recurrent infection and patient has failed non-surgical conservative treatments for greater than 12 weeks to include use of assistive devices, activity modification and wound vac. The indications for the revision total hip arthroplasty are history of total hip infection.  Onset of symptoms was gradual starting 1 years ago with rapidlly worsening course since that time.  Prior procedures on the right hip include arthroplasty.  Patient currently rates pain in the right hip at moderate with activity.  There is joint swelling and non healing wound.   This condition presents safety issues increasing the risk of falls.   There is  current active infection.  Patient Active Problem List   Diagnosis Date Noted  . Infection of prosthetic total hip joint ([place]) 05/28/2018  . Postoperative wound dehiscence 04/25/2018  . Malnutrition of moderate degree 04/24/2018  . History of total hip arthroplasty, right 04/23/2018  . Hematoma of right hip 04/15/2018  . Right hip pain 04/13/2018  . Fall at home, initial encounter 04/13/2018  . AKI (acute kidney injury) ([place]) 04/13/2018  . Elevated troponin 04/13/2018  . Possible Acute lower UTI 04/13/2018  . Sepsis ([place]) 04/12/2018  . UTI (urinary tract infection) 04/12/2018  . Primary osteoarthritis of right hip 02/04/2018  . Failed total hip arthroplasty ([place]) 02/01/2018  . Pre-operative cardiovascular examination 01/31/2018  . Abnormal EKG 01/31/2018  . Vertigo 09/28/2014  . Essential hypertension 09/28/2014  . Thrombocytosis ([place]) 04/23/2012  . Polycythemia vera ([place]) 12/22/2011   Past Medical History:  Diagnosis Date  . Arthritis   . Avascular necrosis of femur head, right ([place]) 04/23/2012  . Cellulitis of shoulder  09/28/2014  . Fracture 10/05/11   "fell and broke right hip"  . Fracture of femoral neck, right ([place]) 10/12/2011  . Glaucoma   . Glaucoma    bilateral  . Hypertension   . Polycythemia vera(238.4)   . Postoperative wound dehiscence   . Rhabdomyolysis 09/28/2014  . Thrombocytosis ([place]) 04/23/2012    Past Surgical History:  Procedure Laterality Date  . COLONOSCOPY    . HARDWARE REMOVAL  04/23/2012   Procedure: HARDWARE REMOVAL;  Surgeon: Johnny Bridge, MD;  Location: [place];  Service: Orthopedics;  Laterality: Right;  . HIP ARTHROPLASTY  04/23/2012   Procedure: ARTHROPLASTY BIPOLAR HIP;  Surgeon: Johnny Bridge, MD;  Location: [facility];  Service: Orthopedics;  Laterality: Right;  . HIP PINNING,CANNULATED  10/12/2011   Procedure: CANNULATED HIP PINNING;  Surgeon: Johnny Bridge, MD;  Location: [facility];  Service: Orthopedics;  Laterality: Right;  . I&D EXTREMITY Right 04/24/2018   Procedure: IRRIGATION AND DEBRIDEMENT EXTREMITY RIGHT HIP WITH POLY EXCHANGE POSTERIOR LATERAL POSITION,stem and head exchange;  Surgeon: Frederik Pear, MD;  Location: [facility];  Service: Orthopedics;  Laterality: Right;  . TOTAL HIP REVISION Right 02/04/2018   Procedure: RIGHT TOTAL HIP REVISION: REVISION OF RIGHT MONOPOLAR/CONVERSION TO TOTAL HIP;  Surgeon: Frederik Pear, MD;  Location: [facility];  Service: Orthopedics;  Laterality: Right;  Requesting 2 hours    No current facility-administered medications for this encounter.    Current Outpatient Medications  Medication Sig Dispense Refill Last Dose  . ALPRAZolam (XANAX) 0.25 MG tablet Take 1 tablet (0.25 mg total) by mouth 2 (two) times daily as needed for anxiety. 10  tablet 0 Taking  . amLODipine (NORVASC) 10 MG tablet Take 10 mg by mouth daily.   Taking  . [START ON 06/07/2018] amoxicillin-clavulanate (AUGMENTIN) 500-125 MG tablet Take 1 tablet (500 mg total) by mouth 2 (two) times daily.     Marland Kitchen anagrelide (AGRYLIN) 1 MG capsule Take 1 capsule (1 mg total) by mouth 2 (two)  times daily. 30 capsule 1 Taking  . benazepril (LOTENSIN) 40 MG tablet Take 40 mg by mouth daily.   Taking  . cefTAZidime (FORTAZ) IVPB Inject 2 g into the vein every 12 (twelve) hours. Indication:  prosthetic hip infection Last Day of Therapy:  06/06/18 Labs - Biweekly: BMP with GFR Labs - Weekly: CBC/D, CRP, ESR 73 Units 0   . hydroxyurea (HYDREA) 500 MG capsule Take 1 tablet ('500mg'$ ) every MWF. Take 2 tablets (1,'000mg'$ ) all other days (Patient taking differently: Take 500 mg by mouth See admin instructions. Take 1 capsule ('500mg'$ ) every MWF.) 44 capsule 0 Taking  . latanoprost (XALATAN) 0.005 % ophthalmic solution Place 1 drop into both eyes at bedtime.    Taking  . loratadine (CLARITIN) 10 MG tablet Take 10 mg by mouth daily as needed for allergies.   Taking  . meclizine (ANTIVERT) 25 MG tablet Take 25 mg by mouth 3 (three) times daily as needed for dizziness.   1 Taking  . oxyCODONE-acetaminophen (PERCOCET/ROXICET) 5-325 MG tablet Take 1 tablet by mouth every 4 (four) hours as needed for severe pain. 30 tablet 0 Taking  . rosuvastatin (CRESTOR) 5 MG tablet Take 5 mg by mouth every evening.   Taking  . tiZANidine (ZANAFLEX) 2 MG tablet Take 1 tablet (2 mg total) by mouth every 6 (six) hours as needed. 60 tablet 0 Taking  . TRAVATAN Z 0.004 % SOLN ophthalmic solution Place 1 drop into both eyes at bedtime.   Taking  . vancomycin IVPB Inject 750 mg into the vein daily. Indication:  Prosthetic hip infection Last Day of Therapy:  06/06/18 Labs - Biweekly: BMP with GFR Labs - Weekly: CBC/D, CRP, ESR, and vancomycin trough 36 Units 0 Taking   Not on File  Social History   Tobacco Use  . Smoking status: Former Smoker    Packs/day: 0.12    Types: Cigarettes    Last attempt to quit: 08/15/1991    Years since quitting: 26.8  . Smokeless tobacco: Never Used  Substance Use Topics  . Alcohol use: No    Comment: 10/12/11 "did drink in my younger; don't drink at all now"    Family History  Problem  Relation Age of Onset  . Cancer Mother   . Hypertension Father   . Asthma Sister       Review of Systems  Constitutional: Negative.   HENT: Positive for sinus pain.   Eyes: Negative.   Respiratory: Negative.   Cardiovascular:       HTN  Gastrointestinal: Negative.   Genitourinary: Negative.   Musculoskeletal: Positive for joint pain.  Skin: Negative.   Neurological: Negative.   Endo/Heme/Allergies: Bruises/bleeds easily.       Tri clonal polycythemia vera  Psychiatric/Behavioral: Negative.     Objective:  Physical Exam  Constitutional: She is oriented to person, place, and time. She appears well-developed and well-nourished.  HENT:  Head: Normocephalic and atraumatic.  Eyes: Pupils are equal, round, and reactive to light.  Neck: Normal range of motion. Neck supple.  Cardiovascular: Intact distal pulses.  Respiratory: Effort normal.  Musculoskeletal:  Overall, the skin looks to be  in good condition.  I went head and took down the back today and unfortunately she has a 3 cm open wound that tracks all the way down to the hip with serous drainage.  Internal and external rotation causes minimal discomfort.  Neurological: She is alert and oriented to person, place, and time.  Skin: Skin is warm and dry.  Psychiatric: She has a normal mood and affect. Her behavior is normal. Judgment and thought content normal.    Vital signs in last 24 hours:     Labs:   Estimated body mass index is 19.74 kg/m as calculated from the following:   Height as of 05/17/18: '5\' 4"'$  (1.626 m).   Weight as of 04/24/18: 52.2 kg.  Imaging Review:  Plain radiographs demonstrate  AP and frog lateral, showing that the S-ROM stem and Pinn[place] shell remained in good position.  There was no overt evidence of osteomyelitis although the bone remains paper thin laterally from the loose cemented stem that was in place.   Preoperative templating of the joint replacement has been completed, documented, and  submitted to the Operating Room personnel in order to optimize intra-operative equipment management.   Assessment/Plan:  Continued evidence of infection.  After revision right total hip arthroplasty for surgery was done in June revision with placement of new components was in September and unfortunately she is not healing and she continues to drain.  At this point we need to go ahead and take her back to the operating room and perform a resection arthroplasty with removal of all the components and then placement of another VAC system.  All this is complicated by her try clonal polycythemia vera, which makes her prone to prolonged bleeding, and we cannot track her white count since her normal white count is 20-30,000.  We will try to get this accomplished on Monday as a scheduled event.  Today I placed a 4 x 4 and paper tape dressing.  When she returns to the nursing center.  They should reinstitute the [facility] therapy until surgery.The patient acknowledged the explanation, agreed to proceed with the plan and consent was signed. Patient is being admitted for inpatient treatment for surgery, pain control, PT, OT, prophylactic antibiotics, VTE prophylaxis, progressive ambulation and ADL's and discharge planning. The patient is planning to be discharged to skilled nursing facility

## 2018-05-31 ENCOUNTER — Inpatient Hospital Stay: Payer: Medicare HMO

## 2018-05-31 DIAGNOSIS — D45 Polycythemia vera: Secondary | ICD-10-CM

## 2018-05-31 LAB — CBC WITH DIFFERENTIAL (CANCER CENTER ONLY)
ABS IMMATURE GRANULOCYTES: 1.36 10*3/uL — AB (ref 0.00–0.07)
BASOS ABS: 0.3 10*3/uL — AB (ref 0.0–0.1)
Basophils Relative: 1 %
Eosinophils Absolute: 0.2 10*3/uL (ref 0.0–0.5)
Eosinophils Relative: 1 %
HCT: 34.6 % — ABNORMAL LOW (ref 36.0–46.0)
Hemoglobin: 10.2 g/dL — ABNORMAL LOW (ref 12.0–15.0)
IMMATURE GRANULOCYTES: 6 %
LYMPHS PCT: 4 %
Lymphs Abs: 1 10*3/uL (ref 0.7–4.0)
MCH: 24.2 pg — AB (ref 26.0–34.0)
MCHC: 29.5 g/dL — ABNORMAL LOW (ref 30.0–36.0)
MCV: 82.2 fL (ref 80.0–100.0)
Monocytes Absolute: 1.2 10*3/uL — ABNORMAL HIGH (ref 0.1–1.0)
Monocytes Relative: 5 %
NEUTROS ABS: 19.1 10*3/uL — AB (ref 1.7–7.7)
NEUTROS PCT: 83 %
NRBC: 0.1 % (ref 0.0–0.2)
Platelet Count: 427 10*3/uL — ABNORMAL HIGH (ref 150–400)
RBC: 4.21 MIL/uL (ref 3.87–5.11)
RDW: 25.8 % — ABNORMAL HIGH (ref 11.5–15.5)
WBC: 23.1 10*3/uL — AB (ref 4.0–10.5)

## 2018-05-31 NOTE — Progress Notes (Signed)
Received a call from Joanell Rising, [place] for Dr. Mayer Camel and wanted to inform me to let [facility] know to stop all antibiotics that patient is on now for her surgery on 06/03/2018 as he wants to do wound cultures. Informed Joanell Rising, PA that I would call [facility] and let them know that.

## 2018-05-31 NOTE — Progress Notes (Signed)
Called and spoke to the Surveyor, quantity of [place], [place] and informed her that I Faxed pre-op instructions to them and gave them the phone order from Joanell Rising, [place] about stopping antibiotics now for patients surgery on 06/03/2018. I gave her Jaclyn Shaggy phone number if she had any questions at all and to call Short Stay at [phone] if she had any questions this evening. She verbalized understanding.

## 2018-06-02 MED ORDER — BUPIVACAINE LIPOSOME 1.3 % IJ SUSP
10.0000 mL | INTRAMUSCULAR | Status: DC
  Filled 2018-06-02: qty 20

## 2018-06-02 MED ORDER — TRANEXAMIC ACID 1000 MG/10ML IV SOLN
2000.0000 mg | INTRAVENOUS | Status: DC
  Filled 2018-06-02: qty 20

## 2018-06-03 ENCOUNTER — Inpatient Hospital Stay (HOSPITAL_COMMUNITY): Payer: Medicare HMO | Admitting: Anesthesiology

## 2018-06-03 ENCOUNTER — Encounter (HOSPITAL_COMMUNITY): Payer: Self-pay | Admitting: *Deleted

## 2018-06-03 ENCOUNTER — Encounter (HOSPITAL_COMMUNITY)
Admission: RE | Disposition: A | Discharge status: Hospice | Payer: Self-pay | Source: Ambulatory Visit | Attending: Orthopedic Surgery

## 2018-06-03 ENCOUNTER — Telehealth (HOSPITAL_COMMUNITY): Payer: Self-pay | Admitting: *Deleted

## 2018-06-03 ENCOUNTER — Other Ambulatory Visit: Payer: Self-pay

## 2018-06-03 ENCOUNTER — Inpatient Hospital Stay (HOSPITAL_COMMUNITY)
Admission: RE | Admit: 2018-06-03 | Discharge: 2018-06-19 | DRG: 466 | Disposition: A | Discharge status: Hospice | Payer: Medicare HMO | Source: Ambulatory Visit | Attending: Orthopedic Surgery | Admitting: Orthopedic Surgery

## 2018-06-03 DIAGNOSIS — Z8249 Family history of ischemic heart disease and other diseases of the circulatory system: Secondary | ICD-10-CM

## 2018-06-03 DIAGNOSIS — T84099A Other mechanical complication of unspecified internal joint prosthesis, initial encounter: Secondary | ICD-10-CM | POA: Diagnosis present

## 2018-06-03 DIAGNOSIS — R0989 Other specified symptoms and signs involving the circulatory and respiratory systems: Secondary | ICD-10-CM

## 2018-06-03 DIAGNOSIS — I10 Essential (primary) hypertension: Secondary | ICD-10-CM | POA: Diagnosis not present

## 2018-06-03 DIAGNOSIS — H409 Unspecified glaucoma: Secondary | ICD-10-CM | POA: Diagnosis present

## 2018-06-03 DIAGNOSIS — Z7189 Other specified counseling: Secondary | ICD-10-CM | POA: Diagnosis not present

## 2018-06-03 DIAGNOSIS — T8459XA Infection and inflammatory reaction due to other internal joint prosthesis, initial encounter: Secondary | ICD-10-CM

## 2018-06-03 DIAGNOSIS — T8451XD Infection and inflammatory reaction due to internal right hip prosthesis, subsequent encounter: Secondary | ICD-10-CM | POA: Diagnosis not present

## 2018-06-03 DIAGNOSIS — I119 Hypertensive heart disease without heart failure: Secondary | ICD-10-CM | POA: Diagnosis present

## 2018-06-03 DIAGNOSIS — Z87440 Personal history of urinary (tract) infections: Secondary | ICD-10-CM | POA: Diagnosis not present

## 2018-06-03 DIAGNOSIS — R2232 Localized swelling, mass and lump, left upper limb: Secondary | ICD-10-CM | POA: Diagnosis not present

## 2018-06-03 DIAGNOSIS — D691 Qualitative platelet defects: Secondary | ICD-10-CM | POA: Diagnosis not present

## 2018-06-03 DIAGNOSIS — D689 Coagulation defect, unspecified: Secondary | ICD-10-CM | POA: Diagnosis present

## 2018-06-03 DIAGNOSIS — L270 Generalized skin eruption due to drugs and medicaments taken internally: Secondary | ICD-10-CM | POA: Diagnosis present

## 2018-06-03 DIAGNOSIS — M96841 Postprocedural hematoma of a musculoskeletal structure following other procedure: Secondary | ICD-10-CM | POA: Diagnosis not present

## 2018-06-03 DIAGNOSIS — R21 Rash and other nonspecific skin eruption: Secondary | ICD-10-CM | POA: Diagnosis not present

## 2018-06-03 DIAGNOSIS — Z9889 Other specified postprocedural states: Secondary | ICD-10-CM | POA: Diagnosis not present

## 2018-06-03 DIAGNOSIS — T50905D Adverse effect of unspecified drugs, medicaments and biological substances, subsequent encounter: Secondary | ICD-10-CM | POA: Diagnosis not present

## 2018-06-03 DIAGNOSIS — D45 Polycythemia vera: Secondary | ICD-10-CM

## 2018-06-03 DIAGNOSIS — Z66 Do not resuscitate: Secondary | ICD-10-CM | POA: Diagnosis present

## 2018-06-03 DIAGNOSIS — E611 Iron deficiency: Secondary | ICD-10-CM | POA: Diagnosis not present

## 2018-06-03 DIAGNOSIS — B9689 Other specified bacterial agents as the cause of diseases classified elsewhere: Secondary | ICD-10-CM | POA: Diagnosis not present

## 2018-06-03 DIAGNOSIS — Z825 Family history of asthma and other chronic lower respiratory diseases: Secondary | ICD-10-CM

## 2018-06-03 DIAGNOSIS — Z79899 Other long term (current) drug therapy: Secondary | ICD-10-CM

## 2018-06-03 DIAGNOSIS — E861 Hypovolemia: Secondary | ICD-10-CM | POA: Diagnosis present

## 2018-06-03 DIAGNOSIS — Z96649 Presence of unspecified artificial hip joint: Secondary | ICD-10-CM | POA: Diagnosis not present

## 2018-06-03 DIAGNOSIS — L8990 Pressure ulcer of unspecified site, unspecified stage: Secondary | ICD-10-CM

## 2018-06-03 DIAGNOSIS — Z87891 Personal history of nicotine dependence: Secondary | ICD-10-CM

## 2018-06-03 DIAGNOSIS — J90 Pleural effusion, not elsewhere classified: Secondary | ICD-10-CM | POA: Diagnosis not present

## 2018-06-03 DIAGNOSIS — Z96641 Presence of right artificial hip joint: Secondary | ICD-10-CM | POA: Diagnosis not present

## 2018-06-03 DIAGNOSIS — Z978 Presence of other specified devices: Secondary | ICD-10-CM | POA: Diagnosis not present

## 2018-06-03 DIAGNOSIS — L89152 Pressure ulcer of sacral region, stage 2: Secondary | ICD-10-CM | POA: Diagnosis present

## 2018-06-03 DIAGNOSIS — Z515 Encounter for palliative care: Secondary | ICD-10-CM | POA: Diagnosis not present

## 2018-06-03 DIAGNOSIS — M7989 Other specified soft tissue disorders: Secondary | ICD-10-CM | POA: Diagnosis not present

## 2018-06-03 DIAGNOSIS — M96840 Postprocedural hematoma of a musculoskeletal structure following a musculoskeletal system procedure: Secondary | ICD-10-CM | POA: Diagnosis not present

## 2018-06-03 DIAGNOSIS — D62 Acute posthemorrhagic anemia: Secondary | ICD-10-CM | POA: Diagnosis not present

## 2018-06-03 DIAGNOSIS — T8450XD Infection and inflammatory reaction due to unspecified internal joint prosthesis, subsequent encounter: Secondary | ICD-10-CM | POA: Diagnosis not present

## 2018-06-03 DIAGNOSIS — Z95828 Presence of other vascular implants and grafts: Secondary | ICD-10-CM | POA: Diagnosis not present

## 2018-06-03 DIAGNOSIS — Y792 Prosthetic and other implants, materials and accessory orthopedic devices associated with adverse incidents: Secondary | ICD-10-CM | POA: Diagnosis present

## 2018-06-03 DIAGNOSIS — Y838 Other surgical procedures as the cause of abnormal reaction of the patient, or of later complication, without mention of misadventure at the time of the procedure: Secondary | ICD-10-CM | POA: Diagnosis not present

## 2018-06-03 DIAGNOSIS — T50905A Adverse effect of unspecified drugs, medicaments and biological substances, initial encounter: Secondary | ICD-10-CM

## 2018-06-03 DIAGNOSIS — Y831 Surgical operation with implant of artificial internal device as the cause of abnormal reaction of the patient, or of later complication, without mention of misadventure at the time of the procedure: Secondary | ICD-10-CM | POA: Diagnosis present

## 2018-06-03 DIAGNOSIS — R234 Changes in skin texture: Secondary | ICD-10-CM | POA: Diagnosis not present

## 2018-06-03 DIAGNOSIS — L299 Pruritus, unspecified: Secondary | ICD-10-CM | POA: Diagnosis present

## 2018-06-03 DIAGNOSIS — T8459XD Infection and inflammatory reaction due to other internal joint prosthesis, subsequent encounter: Secondary | ICD-10-CM | POA: Diagnosis not present

## 2018-06-03 DIAGNOSIS — T8451XA Infection and inflammatory reaction due to internal right hip prosthesis, initial encounter: Principal | ICD-10-CM | POA: Diagnosis present

## 2018-06-03 DIAGNOSIS — R609 Edema, unspecified: Secondary | ICD-10-CM | POA: Diagnosis not present

## 2018-06-03 DIAGNOSIS — N179 Acute kidney failure, unspecified: Secondary | ICD-10-CM | POA: Diagnosis not present

## 2018-06-03 DIAGNOSIS — R410 Disorientation, unspecified: Secondary | ICD-10-CM | POA: Diagnosis not present

## 2018-06-03 DIAGNOSIS — I959 Hypotension, unspecified: Secondary | ICD-10-CM | POA: Diagnosis not present

## 2018-06-03 DIAGNOSIS — G9341 Metabolic encephalopathy: Secondary | ICD-10-CM | POA: Diagnosis not present

## 2018-06-03 DIAGNOSIS — E875 Hyperkalemia: Secondary | ICD-10-CM | POA: Diagnosis present

## 2018-06-03 DIAGNOSIS — M00851 Arthritis due to other bacteria, right hip: Secondary | ICD-10-CM | POA: Diagnosis not present

## 2018-06-03 DIAGNOSIS — R944 Abnormal results of kidney function studies: Secondary | ICD-10-CM | POA: Diagnosis not present

## 2018-06-03 DIAGNOSIS — R7989 Other specified abnormal findings of blood chemistry: Secondary | ICD-10-CM

## 2018-06-03 DIAGNOSIS — R64 Cachexia: Secondary | ICD-10-CM | POA: Diagnosis present

## 2018-06-03 DIAGNOSIS — R627 Adult failure to thrive: Secondary | ICD-10-CM | POA: Diagnosis present

## 2018-06-03 DIAGNOSIS — Z9181 History of falling: Secondary | ICD-10-CM

## 2018-06-03 DIAGNOSIS — Z6826 Body mass index (BMI) 26.0-26.9, adult: Secondary | ICD-10-CM

## 2018-06-03 DIAGNOSIS — R011 Cardiac murmur, unspecified: Secondary | ICD-10-CM | POA: Diagnosis not present

## 2018-06-03 DIAGNOSIS — M0080 Arthritis due to other bacteria, unspecified joint: Secondary | ICD-10-CM | POA: Diagnosis not present

## 2018-06-03 HISTORY — PX: APPLICATION OF WOUND VAC: SHX5189

## 2018-06-03 HISTORY — DX: Disruption of external operation (surgical) wound, not elsewhere classified, initial encounter: T81.31XA

## 2018-06-03 HISTORY — PX: INCISION AND DRAINAGE HIP: SHX1801

## 2018-06-03 HISTORY — PX: TOTAL HIP ARTHROPLASTY WITH HARDWARE REMOVAL: SHX6438

## 2018-06-03 HISTORY — PX: HARDWARE REMOVAL: SHX979

## 2018-06-03 LAB — BASIC METABOLIC PANEL
Anion gap: 8 (ref 5–15)
BUN: 10 mg/dL (ref 8–23)
CO2: 25 mmol/L (ref 22–32)
CREATININE: 0.65 mg/dL (ref 0.44–1.00)
Calcium: 8.7 mg/dL — ABNORMAL LOW (ref 8.9–10.3)
Chloride: 111 mmol/L (ref 98–111)
GFR calc Af Amer: >60 mL/min (ref >60)
GLUCOSE: 80 mg/dL (ref 70–99)
POTASSIUM: 3.4 mmol/L — AB (ref 3.5–5.1)
SODIUM: 144 mmol/L (ref 135–145)

## 2018-06-03 LAB — CBC WITH DIFFERENTIAL/PLATELET
Abs Immature Granulocytes: 2.45 10*3/uL — ABNORMAL HIGH (ref 0.00–0.07)
BASOS PCT: 1 %
Basophils Absolute: 0.4 10*3/uL — ABNORMAL HIGH (ref 0.0–0.1)
EOS PCT: 4 %
Eosinophils Absolute: 1.2 10*3/uL — ABNORMAL HIGH (ref 0.0–0.5)
HCT: 35.2 % — ABNORMAL LOW (ref 36.0–46.0)
Hemoglobin: 10 g/dL — ABNORMAL LOW (ref 12.0–15.0)
IMMATURE GRANULOCYTES: 7 %
LYMPHS PCT: 5 %
Lymphs Abs: 1.6 10*3/uL (ref 0.7–4.0)
MCH: 23.8 pg — ABNORMAL LOW (ref 26.0–34.0)
MCHC: 28.4 g/dL — AB (ref 30.0–36.0)
MCV: 83.6 fL (ref 80.0–100.0)
MONO ABS: 1.4 10*3/uL — AB (ref 0.1–1.0)
MONOS PCT: 4 %
NEUTROS ABS: 26.2 10*3/uL — AB (ref 1.7–7.7)
Neutrophils Relative %: 79 %
Platelets: 478 10*3/uL — ABNORMAL HIGH (ref 150–400)
RBC: 4.21 MIL/uL (ref 3.87–5.11)
RDW: 26.4 % — AB (ref 11.5–15.5)
WBC: 33.3 10*3/uL — AB (ref 4.0–10.5)
nRBC: 0.1 % (ref 0.0–0.2)

## 2018-06-03 LAB — URINALYSIS, ROUTINE W REFLEX MICROSCOPIC
Bilirubin Urine: NEGATIVE
Glucose, UA: NEGATIVE mg/dL
Hgb urine dipstick: NEGATIVE
KETONES UR: NEGATIVE mg/dL
Leukocytes, UA: NEGATIVE
Nitrite: NEGATIVE
PH: 6 (ref 5.0–8.0)
Protein, ur: 30 mg/dL — AB
Specific Gravity, Urine: 1.015 (ref 1.005–1.030)

## 2018-06-03 LAB — PROTIME-INR
INR: 1.32
Prothrombin Time: 16.2 seconds — ABNORMAL HIGH (ref 11.4–15.2)

## 2018-06-03 LAB — HEMOGLOBIN AND HEMATOCRIT, BLOOD
HEMATOCRIT: 16.3 % — AB (ref 36.0–46.0)
HEMATOCRIT: 27.5 % — AB (ref 36.0–46.0)
HEMOGLOBIN: 4.7 g/dL — AB (ref 12.0–15.0)
Hemoglobin: 7.6 g/dL — ABNORMAL LOW (ref 12.0–15.0)

## 2018-06-03 LAB — PREPARE RBC (CROSSMATCH)

## 2018-06-03 LAB — APTT: APTT: 31 s (ref 24–36)

## 2018-06-03 SURGERY — REVISION, ARTHROPLASTY, HIP
Anesthesia: General | Site: Hip | Laterality: Right

## 2018-06-03 MED ORDER — ROSUVASTATIN CALCIUM 5 MG PO TABS
5.0000 mg | ORAL_TABLET | Freq: Every evening | ORAL | Status: DC
  Administered 2018-06-05 – 2018-06-17 (×11): 5 mg via ORAL
  Filled 2018-06-03 (×13): qty 1

## 2018-06-03 MED ORDER — HYDROMORPHONE HCL 1 MG/ML IJ SOLN
0.5000 mg | INTRAMUSCULAR | Status: DC | PRN
  Administered 2018-06-04: 0.5 mg via INTRAVENOUS
  Administered 2018-06-12: 1 mg via INTRAVENOUS
  Filled 2018-06-03 (×2): qty 1

## 2018-06-03 MED ORDER — FENTANYL CITRATE (PF) 100 MCG/2ML IJ SOLN
INTRAMUSCULAR | Status: AC
  Filled 2018-06-03: qty 2

## 2018-06-03 MED ORDER — HYDROXYUREA 500 MG PO CAPS: 500.0000 mg | ORAL_CAPSULE | ORAL | Status: DC

## 2018-06-03 MED ORDER — LORATADINE 10 MG PO TABS: 10.0000 mg | ORAL_TABLET | Freq: Every day | ORAL | Status: DC | PRN

## 2018-06-03 MED ORDER — HYDROGEN PEROXIDE 3 % EX SOLN
CUTANEOUS | Status: AC
  Filled 2018-06-03: qty 473

## 2018-06-03 MED ORDER — FENTANYL CITRATE (PF) 250 MCG/5ML IJ SOLN
INTRAMUSCULAR | Status: DC | PRN
  Administered 2018-06-03 (×2): 25 ug via INTRAVENOUS
  Administered 2018-06-03: 100 ug via INTRAVENOUS

## 2018-06-03 MED ORDER — PANTOPRAZOLE SODIUM 40 MG PO TBEC
40.0000 mg | DELAYED_RELEASE_TABLET | Freq: Every day | ORAL | Status: DC
  Administered 2018-06-04 – 2018-06-17 (×14): 40 mg via ORAL
  Filled 2018-06-03 (×16): qty 1

## 2018-06-03 MED ORDER — PHENOL 1.4 % MT LIQD
1.0000 | OROMUCOSAL | Status: DC | PRN
  Filled 2018-06-03: qty 177

## 2018-06-03 MED ORDER — ALUM & MAG HYDROXIDE-SIMETH 200-200-20 MG/5ML PO SUSP: 30.0000 mL | ORAL | Status: DC | PRN

## 2018-06-03 MED ORDER — DEXAMETHASONE SODIUM PHOSPHATE 10 MG/ML IJ SOLN
INTRAMUSCULAR | Status: DC | PRN
  Administered 2018-06-03: 4 mg via INTRAVENOUS

## 2018-06-03 MED ORDER — TOBRAMYCIN SULFATE 1.2 G IJ SOLR
INTRAMUSCULAR | Status: DC | PRN
  Administered 2018-06-03: 2.4 g

## 2018-06-03 MED ORDER — TOBRAMYCIN SULFATE 1.2 G IJ SOLR
INTRAMUSCULAR | Status: AC
  Filled 2018-06-03: qty 4.8

## 2018-06-03 MED ORDER — ONDANSETRON HCL 4 MG/2ML IJ SOLN
INTRAMUSCULAR | Status: AC
  Filled 2018-06-03: qty 2

## 2018-06-03 MED ORDER — SUCCINYLCHOLINE CHLORIDE 200 MG/10ML IV SOSY
PREFILLED_SYRINGE | INTRAVENOUS | Status: DC | PRN
  Administered 2018-06-03: 100 mg via INTRAVENOUS

## 2018-06-03 MED ORDER — GABAPENTIN 300 MG PO CAPS
300.0000 mg | ORAL_CAPSULE | Freq: Three times a day (TID) | ORAL | Status: DC
  Administered 2018-06-04 – 2018-06-05 (×3): 300 mg via ORAL
  Filled 2018-06-03 (×3): qty 1

## 2018-06-03 MED ORDER — MENTHOL 3 MG MT LOZG: 1.0000 | LOZENGE | OROMUCOSAL | Status: DC | PRN

## 2018-06-03 MED ORDER — TRANEXAMIC ACID-NACL 1000-0.7 MG/100ML-% IV SOLN: 1000.0000 mg | INTRAVENOUS | Status: DC

## 2018-06-03 MED ORDER — VANCOMYCIN HCL IN DEXTROSE 750-5 MG/150ML-% IV SOLN
750.0000 mg | Freq: Once | INTRAVENOUS | Status: AC
  Administered 2018-06-03: 750 mg via INTRAVENOUS
  Filled 2018-06-03: qty 150

## 2018-06-03 MED ORDER — PROPOFOL 10 MG/ML IV BOLUS
INTRAVENOUS | Status: DC | PRN
  Administered 2018-06-03: 80 mg via INTRAVENOUS
  Administered 2018-06-03 (×3): 20 mg via INTRAVENOUS

## 2018-06-03 MED ORDER — SUGAMMADEX SODIUM 200 MG/2ML IV SOLN
INTRAVENOUS | Status: AC
  Filled 2018-06-03: qty 2

## 2018-06-03 MED ORDER — OXYCODONE HCL 5 MG PO TABS
5.0000 mg | ORAL_TABLET | ORAL | Status: DC | PRN
  Administered 2018-06-04 – 2018-06-10 (×9): 5 mg via ORAL
  Filled 2018-06-03 (×9): qty 1

## 2018-06-03 MED ORDER — AMOXICILLIN-POT CLAVULANATE 500-125 MG PO TABS: 1.0000 | ORAL_TABLET | Freq: Two times a day (BID) | ORAL | Status: DC

## 2018-06-03 MED ORDER — TRANEXAMIC ACID 1000 MG/10ML IV SOLN
INTRAVENOUS | Status: DC | PRN
  Administered 2018-06-03: 2000 mg via TOPICAL

## 2018-06-03 MED ORDER — 0.9 % SODIUM CHLORIDE (POUR BTL) OPTIME
TOPICAL | Status: DC | PRN
  Administered 2018-06-03: 1000 mL

## 2018-06-03 MED ORDER — LIDOCAINE 2% (20 MG/ML) 5 ML SYRINGE
INTRAMUSCULAR | Status: DC | PRN
  Administered 2018-06-03: 50 mg via INTRAVENOUS

## 2018-06-03 MED ORDER — FENTANYL CITRATE (PF) 100 MCG/2ML IJ SOLN
INTRAMUSCULAR | Status: DC | PRN
  Administered 2018-06-03 (×2): 50 ug via INTRAVENOUS

## 2018-06-03 MED ORDER — TRANEXAMIC ACID-NACL 1000-0.7 MG/100ML-% IV SOLN
1000.0000 mg | INTRAVENOUS | Status: AC
  Administered 2018-06-03: 1000 mg via INTRAVENOUS
  Filled 2018-06-03: qty 100

## 2018-06-03 MED ORDER — MEPERIDINE HCL 50 MG/ML IJ SOLN: 6.2500 mg | INTRAMUSCULAR | Status: DC | PRN

## 2018-06-03 MED ORDER — CEFTAZIDIME SODIUM IN D5W 2-5 GM/50ML-% IV SOLN
2.0000 g | Freq: Two times a day (BID) | INTRAVENOUS | Status: DC
  Filled 2018-06-03: qty 50

## 2018-06-03 MED ORDER — LACTATED RINGERS IV SOLN
INTRAVENOUS | Status: DC
  Administered 2018-06-03: 13:00:00 via INTRAVENOUS

## 2018-06-03 MED ORDER — ANAGRELIDE HCL 0.5 MG PO CAPS
1.0000 mg | ORAL_CAPSULE | Freq: Two times a day (BID) | ORAL | Status: DC
  Administered 2018-06-04 – 2018-06-17 (×26): 1 mg via ORAL
  Filled 2018-06-03 (×28): qty 2

## 2018-06-03 MED ORDER — FLEET ENEMA 7-19 GM/118ML RE ENEM: 1.0000 | ENEMA | Freq: Once | RECTAL | Status: DC | PRN

## 2018-06-03 MED ORDER — POLYETHYLENE GLYCOL 3350 17 G PO PACK
17.0000 g | PACK | Freq: Every day | ORAL | Status: DC | PRN
  Administered 2018-06-08 – 2018-06-14 (×3): 17 g via ORAL
  Filled 2018-06-03 (×3): qty 1

## 2018-06-03 MED ORDER — PROPOFOL 10 MG/ML IV BOLUS
INTRAVENOUS | Status: AC
  Filled 2018-06-03: qty 60

## 2018-06-03 MED ORDER — KCL IN DEXTROSE-NACL 20-5-0.45 MEQ/L-%-% IV SOLN
INTRAVENOUS | Status: DC
  Administered 2018-06-05 – 2018-06-10 (×15): via INTRAVENOUS
  Filled 2018-06-03 (×17): qty 1000

## 2018-06-03 MED ORDER — BUPIVACAINE HCL (PF) 0.25 % IJ SOLN
INTRAMUSCULAR | Status: AC
  Filled 2018-06-03: qty 30

## 2018-06-03 MED ORDER — PROMETHAZINE HCL 25 MG/ML IJ SOLN: 6.2500 mg | INTRAMUSCULAR | Status: DC | PRN

## 2018-06-03 MED ORDER — CELECOXIB 200 MG PO CAPS
200.0000 mg | ORAL_CAPSULE | Freq: Two times a day (BID) | ORAL | Status: DC
  Administered 2018-06-04 – 2018-06-05 (×3): 200 mg via ORAL
  Filled 2018-06-03 (×3): qty 1

## 2018-06-03 MED ORDER — SODIUM CHLORIDE 0.9 % IJ SOLN
INTRAMUSCULAR | Status: AC
  Filled 2018-06-03: qty 50

## 2018-06-03 MED ORDER — LIDOCAINE 2% (20 MG/ML) 5 ML SYRINGE
INTRAMUSCULAR | Status: AC
  Filled 2018-06-03: qty 5

## 2018-06-03 MED ORDER — SODIUM CHLORIDE 0.9% IV SOLUTION: Freq: Once | INTRAVENOUS | Status: DC

## 2018-06-03 MED ORDER — BISACODYL 5 MG PO TBEC
5.0000 mg | DELAYED_RELEASE_TABLET | Freq: Every day | ORAL | Status: DC | PRN
  Administered 2018-06-09: 5 mg via ORAL
  Filled 2018-06-03: qty 1

## 2018-06-03 MED ORDER — LATANOPROST 0.005 % OP SOLN
1.0000 [drp] | Freq: Every day | OPHTHALMIC | Status: DC
  Administered 2018-06-04 – 2018-06-12 (×8): 1 [drp] via OPHTHALMIC
  Filled 2018-06-03: qty 2.5

## 2018-06-03 MED ORDER — FENTANYL CITRATE (PF) 100 MCG/2ML IJ SOLN
INTRAMUSCULAR | Status: AC
  Administered 2018-06-03: 50 ug via INTRAVENOUS
  Filled 2018-06-03: qty 2

## 2018-06-03 MED ORDER — DEXAMETHASONE SODIUM PHOSPHATE 10 MG/ML IJ SOLN
INTRAMUSCULAR | Status: AC
  Filled 2018-06-03: qty 1

## 2018-06-03 MED ORDER — ROCURONIUM BROMIDE 10 MG/ML (PF) SYRINGE
PREFILLED_SYRINGE | INTRAVENOUS | Status: AC
  Filled 2018-06-03: qty 10

## 2018-06-03 MED ORDER — METOCLOPRAMIDE HCL 5 MG/ML IJ SOLN: 5.0000 mg | Freq: Three times a day (TID) | INTRAMUSCULAR | Status: DC | PRN

## 2018-06-03 MED ORDER — DIPHENHYDRAMINE HCL 12.5 MG/5ML PO ELIX
12.5000 mg | ORAL_SOLUTION | ORAL | Status: DC | PRN
  Administered 2018-06-11: 12.5 mg via ORAL
  Filled 2018-06-03: qty 5

## 2018-06-03 MED ORDER — ACETAMINOPHEN 325 MG PO TABS
325.0000 mg | ORAL_TABLET | Freq: Four times a day (QID) | ORAL | Status: DC | PRN
  Administered 2018-06-04 – 2018-06-16 (×5): 650 mg via ORAL
  Filled 2018-06-03 (×6): qty 2

## 2018-06-03 MED ORDER — METHOCARBAMOL 500 MG IVPB - SIMPLE MED
INTRAVENOUS | Status: AC
  Administered 2018-06-03: 500 mg via INTRAVENOUS
  Filled 2018-06-03: qty 50

## 2018-06-03 MED ORDER — BUPIVACAINE LIPOSOME 1.3 % IJ SUSP: 20.0000 mL | Freq: Once | INTRAMUSCULAR | Status: DC

## 2018-06-03 MED ORDER — METHOCARBAMOL 500 MG PO TABS
500.0000 mg | ORAL_TABLET | Freq: Four times a day (QID) | ORAL | Status: DC | PRN
  Administered 2018-06-06 – 2018-06-07 (×2): 500 mg via ORAL
  Filled 2018-06-03 (×3): qty 1

## 2018-06-03 MED ORDER — DOCUSATE SODIUM 100 MG PO CAPS
100.0000 mg | ORAL_CAPSULE | Freq: Two times a day (BID) | ORAL | Status: DC
  Administered 2018-06-04 – 2018-06-17 (×25): 100 mg via ORAL
  Filled 2018-06-03 (×27): qty 1

## 2018-06-03 MED ORDER — ALPRAZOLAM 0.25 MG PO TABS
0.2500 mg | ORAL_TABLET | Freq: Two times a day (BID) | ORAL | Status: DC | PRN
  Administered 2018-06-08: 0.25 mg via ORAL
  Filled 2018-06-03: qty 1

## 2018-06-03 MED ORDER — SODIUM CHLORIDE 0.9 % IV SOLN
2.0000 g | Freq: Two times a day (BID) | INTRAVENOUS | Status: DC
  Administered 2018-06-04 – 2018-06-05 (×3): 2 g via INTRAVENOUS
  Filled 2018-06-03 (×4): qty 2

## 2018-06-03 MED ORDER — VANCOMYCIN IV (FOR PTA / DISCHARGE USE ONLY): 750.0000 mg | INTRAVENOUS | Status: DC

## 2018-06-03 MED ORDER — ACETAMINOPHEN 500 MG PO TABS
1000.0000 mg | ORAL_TABLET | Freq: Four times a day (QID) | ORAL | Status: AC
  Administered 2018-06-04: 1000 mg via ORAL
  Filled 2018-06-03 (×2): qty 2

## 2018-06-03 MED ORDER — ONDANSETRON HCL 4 MG PO TABS: 4.0000 mg | ORAL_TABLET | Freq: Four times a day (QID) | ORAL | Status: DC | PRN

## 2018-06-03 MED ORDER — METOCLOPRAMIDE HCL 5 MG PO TABS: 5.0000 mg | ORAL_TABLET | Freq: Three times a day (TID) | ORAL | Status: DC | PRN

## 2018-06-03 MED ORDER — SODIUM CHLORIDE 0.9 % IV SOLN
2.0000 g | Freq: Once | INTRAVENOUS | Status: AC
  Administered 2018-06-03: 2 g via INTRAVENOUS
  Filled 2018-06-03: qty 2

## 2018-06-03 MED ORDER — LACTATED RINGERS IV SOLN: INTRAVENOUS | Status: DC

## 2018-06-03 MED ORDER — AMLODIPINE BESYLATE 10 MG PO TABS
10.0000 mg | ORAL_TABLET | Freq: Every day | ORAL | Status: DC
  Administered 2018-06-05 – 2018-06-12 (×8): 10 mg via ORAL
  Filled 2018-06-03 (×8): qty 1

## 2018-06-03 MED ORDER — VANCOMYCIN HCL 1000 MG IV SOLR
INTRAVENOUS | Status: AC
  Filled 2018-06-03: qty 4000

## 2018-06-03 MED ORDER — DEXAMETHASONE SODIUM PHOSPHATE 10 MG/ML IJ SOLN
10.0000 mg | Freq: Once | INTRAMUSCULAR | Status: AC
  Administered 2018-06-04: 10 mg via INTRAVENOUS
  Filled 2018-06-03: qty 1

## 2018-06-03 MED ORDER — FENTANYL CITRATE (PF) 250 MCG/5ML IJ SOLN
INTRAMUSCULAR | Status: AC
  Filled 2018-06-03: qty 5

## 2018-06-03 MED ORDER — METHOCARBAMOL 500 MG IVPB - SIMPLE MED
500.0000 mg | Freq: Four times a day (QID) | INTRAVENOUS | Status: DC | PRN
  Filled 2018-06-03: qty 50

## 2018-06-03 MED ORDER — ONDANSETRON HCL 4 MG/2ML IJ SOLN
INTRAMUSCULAR | Status: DC | PRN
  Administered 2018-06-03: 4 mg via INTRAVENOUS

## 2018-06-03 MED ORDER — CHLORHEXIDINE GLUCONATE 4 % EX LIQD: 60.0000 mL | Freq: Once | CUTANEOUS | Status: DC

## 2018-06-03 MED ORDER — ONDANSETRON HCL 4 MG/2ML IJ SOLN
4.0000 mg | Freq: Four times a day (QID) | INTRAMUSCULAR | Status: DC | PRN
  Administered 2018-06-04 – 2018-06-12 (×3): 4 mg via INTRAVENOUS
  Filled 2018-06-03 (×3): qty 2

## 2018-06-03 MED ORDER — FENTANYL CITRATE (PF) 100 MCG/2ML IJ SOLN
25.0000 ug | INTRAMUSCULAR | Status: DC | PRN
  Administered 2018-06-03: 50 ug via INTRAVENOUS

## 2018-06-03 MED ORDER — VANCOMYCIN HCL 1000 MG IV SOLR
INTRAVENOUS | Status: DC | PRN
  Administered 2018-06-03: 2 g

## 2018-06-03 MED ORDER — ROCURONIUM BROMIDE 10 MG/ML (PF) SYRINGE
PREFILLED_SYRINGE | INTRAVENOUS | Status: DC | PRN
  Administered 2018-06-03: 25 mg via INTRAVENOUS
  Administered 2018-06-03: 10 mg via INTRAVENOUS

## 2018-06-03 MED ORDER — SODIUM CHLORIDE 0.9 % IR SOLN
Status: DC | PRN
  Administered 2018-06-03: 3000 mL

## 2018-06-03 MED ORDER — LATANOPROST 0.005 % OP SOLN: 1.0000 [drp] | Freq: Every day | OPHTHALMIC | Status: DC

## 2018-06-03 MED ORDER — LACTATED RINGERS IV SOLN
INTRAVENOUS | Status: DC | PRN
  Administered 2018-06-03 (×2): via INTRAVENOUS

## 2018-06-03 MED ORDER — MECLIZINE HCL 25 MG PO TABS
25.0000 mg | ORAL_TABLET | Freq: Three times a day (TID) | ORAL | Status: DC | PRN
  Filled 2018-06-03: qty 1

## 2018-06-03 MED ORDER — HYDROGEN PEROXIDE 3 % EX SOLN
CUTANEOUS | Status: DC | PRN
  Administered 2018-06-03: 1 via TOPICAL

## 2018-06-03 MED ORDER — TRANEXAMIC ACID-NACL 1000-0.7 MG/100ML-% IV SOLN: 1000.0000 mg | Freq: Once | INTRAVENOUS | Status: DC

## 2018-06-03 MED ORDER — FENTANYL CITRATE (PF) 100 MCG/2ML IJ SOLN: 25.0000 ug | INTRAMUSCULAR | Status: DC | PRN

## 2018-06-03 MED ORDER — SUGAMMADEX SODIUM 200 MG/2ML IV SOLN
INTRAVENOUS | Status: DC | PRN
  Administered 2018-06-03: 150 mg via INTRAVENOUS

## 2018-06-03 SURGICAL SUPPLY — 47 items
CANISTER WOUNDNEG PRESSURE 500 (CANNISTER) ×1 IMPLANT
CEMENT BONE DEPUY (Cement) ×1 IMPLANT
COVER SURGICAL LIGHT HANDLE (MISCELLANEOUS) ×2 IMPLANT
COVER WAND RF STERILE (DRAPES) ×1 IMPLANT
DRAPE C-ARM 42X120 X-RAY (DRAPES) ×2 IMPLANT
DRAPE ORTHO SPLIT 77X108 STRL (DRAPES) ×2
DRAPE SHEET LG 3/4 BI-LAMINATE (DRAPES) ×4 IMPLANT
DRAPE SURG ORHT 6 SPLT 77X108 (DRAPES) ×1 IMPLANT
DRAPE U-SHAPE 47X51 STRL (DRAPES) ×2 IMPLANT
DRSG VAC ATS LRG SENSATRAC (GAUZE/BANDAGES/DRESSINGS) ×1 IMPLANT
ELECT BLADE TIP CTD 4 INCH (ELECTRODE) ×3 IMPLANT
ELECT REM PT RETURN 15FT ADLT (MISCELLANEOUS) ×2 IMPLANT
GLOVE BIO SURGEON STRL SZ 6.5 (GLOVE) ×1 IMPLANT
GLOVE BIO SURGEON STRL SZ7.5 (GLOVE) ×2 IMPLANT
GLOVE BIO SURGEON STRL SZ8.5 (GLOVE) ×2 IMPLANT
GLOVE BIOGEL PI IND STRL 6.5 (GLOVE) IMPLANT
GLOVE BIOGEL PI IND STRL 7.5 (GLOVE) IMPLANT
GLOVE BIOGEL PI IND STRL 8 (GLOVE) ×1 IMPLANT
GLOVE BIOGEL PI IND STRL 9 (GLOVE) ×1 IMPLANT
GLOVE BIOGEL PI INDICATOR 6.5 (GLOVE) ×1
GLOVE BIOGEL PI INDICATOR 7.5 (GLOVE) ×3
GLOVE BIOGEL PI INDICATOR 8 (GLOVE) ×1
GLOVE BIOGEL PI INDICATOR 9 (GLOVE) ×1
GOWN SPEC L3 XXLG W/TWL (GOWN DISPOSABLE) ×1 IMPLANT
GOWN STRL REUS W/TWL LRG LVL3 (GOWN DISPOSABLE) ×1 IMPLANT
GOWN STRL REUS W/TWL XL LVL3 (GOWN DISPOSABLE) ×1 IMPLANT
HOOD PEEL AWAY FLYTE STAYCOOL (MISCELLANEOUS) ×8 IMPLANT
IMMOBILIZER KNEE 20 (SOFTGOODS) ×2
IMMOBILIZER KNEE 20 THIGH 36 (SOFTGOODS) ×1 IMPLANT
KIT BASIN OR (CUSTOM PROCEDURE TRAY) ×2 IMPLANT
NDL MAYO CATGUT SZ4 TPR NDL (NEEDLE) ×1 IMPLANT
NEEDLE HYPO 22GX1.5 SAFETY (NEEDLE) ×4 IMPLANT
NEEDLE MAYO CATGUT SZ4 (NEEDLE) ×2 IMPLANT
PACK TOTAL JOINT (CUSTOM PROCEDURE TRAY) ×2 IMPLANT
PASSER SUT SWANSON 36MM LOOP (INSTRUMENTS) ×2 IMPLANT
POSITIONER SURGICAL ARM (MISCELLANEOUS) ×2 IMPLANT
SPONGE LAP 18X18 RF (DISPOSABLE) ×2 IMPLANT
SUT VIC AB 1 CTX 36 (SUTURE) ×2
SUT VIC AB 1 CTX36XBRD ANBCTR (SUTURE) ×1 IMPLANT
SWAB COLLECTION DEVICE MRSA (MISCELLANEOUS) ×1 IMPLANT
SWAB CULTURE ESWAB REG 1ML (MISCELLANEOUS) ×1 IMPLANT
SYR CONTROL 10ML LL (SYRINGE) ×4 IMPLANT
TOWEL OR 17X26 10 PK STRL BLUE (TOWEL DISPOSABLE) ×2 IMPLANT
TOWEL OR NON WOVEN STRL DISP B (DISPOSABLE) ×2 IMPLANT
TOWER CARTRIDGE SMART MIX (DISPOSABLE) ×1 IMPLANT
TRAY FOLEY MTR SLVR 14FR STAT (SET/KITS/TRAYS/PACK) ×1 IMPLANT
YANKAUER SUCT BULB TIP 10FT TU (MISCELLANEOUS) ×2 IMPLANT

## 2018-06-03 NOTE — Anesthesia Procedure Notes (Signed)
Procedure Name: Intubation Date/Time: 06/03/2018 3:48 PM Performed by: Cynda Familia, CRNA Pre-anesthesia Checklist: Patient identified, Emergency Drugs available, Suction available and Patient being monitored Patient Re-evaluated:Patient Re-evaluated prior to induction Oxygen Delivery Method: Circle System Utilized Preoxygenation: Pre-oxygenation with 100% oxygen Induction Type: IV induction Ventilation: Mask ventilation without difficulty Laryngoscope Size: Miller and 2 Grade View: Grade I Tube type: Oral Tube size: 7.0 mm Number of attempts: 1 Airway Equipment and Method: Stylet Placement Confirmation: ETT inserted through vocal cords under direct vision,  positive ETCO2 and breath sounds checked- equal and bilateral Secured at: 21 cm Tube secured with: Tape Dental Injury: Teeth and Oropharynx as per pre-operative assessment  Comments: Smooth IV induction Hollis-- intubation AM CRNA atraumatic-- teeth and mouth as preop- bilat BS Hollis-- very poor dentition

## 2018-06-03 NOTE — Progress Notes (Signed)
Spoke to RR nurse, pt pressure manually checked and was 84/56. He stated to give patient a bolus of 500 LR. Bolus started. Will CTM. Julio Alm RN

## 2018-06-03 NOTE — Anesthesia Procedure Notes (Signed)
Date/Time: 06/03/2018 5:56 PM Performed by: Cynda Familia, CRNA Oxygen Delivery Method: Simple face mask Placement Confirmation: positive ETCO2 and breath sounds checked- equal and bilateral Dental Injury: Teeth and Oropharynx as per pre-operative assessment

## 2018-06-03 NOTE — Anesthesia Postprocedure Evaluation (Signed)
Anesthesia Post Note  Patient: Alejandra [name]  Procedure(s) Performed: RIGHT HIP RESECTION AND IMPLANTATION OF CEMENT SPACER (Right Hip) APPLICATION OF WOUND VAC (Right Hip) IRRIGATION AND DEBRIDEMENT OF RIGHT HIP (Right Hip) RIGHT HIP HARDWARE REMOVAL (Right Hip)     Patient location during evaluation: PACU Anesthesia Type: General Level of consciousness: awake Pain management: pain level controlled Vital Signs Assessment: post-procedure vital signs reviewed and stable Respiratory status: spontaneous breathing Cardiovascular status: stable Postop Assessment: no apparent nausea or vomiting Anesthetic complications: no    Last Vitals:  Vitals:   06/03/18 1815 06/03/18 1830  BP:    Pulse: (!) 106 96  Resp: 18 12  Temp:    SpO2: 98% 97%    Last Pain:  Vitals:   06/03/18 1254  TempSrc: Oral   Pain Goal:        RLE Motor Response: Purposeful movement (06/03/18 1830) RLE Sensation: Full sensation (06/03/18 1830)      Huston Foley

## 2018-06-03 NOTE — Progress Notes (Signed)
Dr Mayer Camel at bedside and took wound vac to return to family.

## 2018-06-03 NOTE — Interval H&P Note (Signed)
History and Physical Interval Note:  06/03/2018 2:39 PM  [place]  has presented today for surgery, with the diagnosis of INFECTED RIGHT TOTAL HIP ARTHROPLASTY  The various methods of treatment have been discussed with the patient and family. After consideration of risks, benefits and other options for treatment, the patient has consented to  Procedure(s): TOTAL HIP ARTHROPLASTY RESECTION WITH HARDWARE REMOVAL REPLACEMENT OF [place] (Right) as a surgical intervention .  The patient's history has been reviewed, patient examined, no change in status, stable for surgery.  I have reviewed the patient's chart and labs.  Questions were answered to the patient's satisfaction.     Kerin Salen

## 2018-06-03 NOTE — Op Note (Signed)
Pre Op Dx: I recurrent infection of unknown organism, of revision right total hip arthroplasty index procedure was done in June irrigation and debridement with poly-exchange was done in September despite IV antibiotics and wound VAC the patient has a draining sinus.  She also history of tri-clonal polycythemia vera with chronic platelet counts of 1.5 million and white counts of 30,000  Post Op Dx: Same   Procedure: Radical irrigation and debridement of infected right total hip with removal of all hardware, placement of acetabular PMMA spacer with 4.8 g of tobramycin and 4 g of vancomycin in 2 bags of DePuy 1 cement.  We also placed a wound VAC.  Surgeon: Kerin Salen, MD   Assistant: Kerry Hough. Barton Dubois (present throughout entire procedure and necessary for timely completion of the procedure)   Anesthesia: General   EBL: 500 cc Fluids: 1500 cc of crystalloid Specimens: Synovial fluid for culture and Gram stain x2 deep tissue from right hip for Gram stain and culture also.  Indications: Patient had a revision right total hip arthroplasty for a loose femoral compone on 02/04/2018.  Because of her polycythemia vera she had chronic bleeding afterwards despite platelet counts of 1.5 million and had signs and symptoms consistent with infection although cultures taken off of antibiotics x2 did not reveal any organisms.  On 04/24/2018 she underwent radical irrigation and debridement with revision of the polyethylene liner S-ROM stem and metal ball.  She was placed with a wound VAC and IV antibiotics per infectious diseases unfortunately she is continued to drain serous fluid has a sinus tract that goes all the way down to the hip.  Because of these findings she is taken for irrigation debridement with removal of prosthesis placement of a PMMA spacer in the acetabulum and proximal femur that is non-articulated.  It is unlikely she would tolerate yet another revision.  The PMMA spacer is just to maintain some  leg length and deliver antibiotics.  The risks and benefits of surgery have been discussed and all questions answered.  Procedure: Patient identified by arm band and taken to [facility] appropriate, anesthetic monitors were attached and general endotracheal anesthesia induced with the patient in the supine position. Foley catheter was inserted and she was rolled into the lateral decubitus position and right lower extremity prepped and draped in usual sterile fashion from the ankle to the hemipelvis. Time out procedure performed, we began the procedure itself by re-creating the posterior lateral hip incision and also excising the sinus tract down the level of the IT band large amounts of fibrinous material were removed from the interval between the IT band and the greater trochanter and deep tissue specimens as well as hip synovial fluid was sent for Gram stain and culture.  We continued to dissect the inflamed tissue from around the acetabulum until we could dislocate the hip we then removed the stem and ball with the slaphammer snare.  This gave [place] access to the acetabulum we broke the interface between the shell and the bone with 1/4 inch osteotome and then using the curved Innomed osteotomes carve the shell out of the acetabulum with minimal damage to the bone.  The underlying bone beneath the shell was in relatively good condition.  We then dissected around the S-ROM sleeve using instruments from the [facility] set and an using the sleeve extractor removed it again without much damage to the bone.  We then removed any necrotic or dead tissue sharply and also  used curettes and rongeurs.  The wound was then thoroughly irrigated out with normal saline solution followed by hydrogen peroxide followed by pulse lavage including the bottle brush and the femur.  A TXA soaked sponge was then placed in the wound while we prepared a triple batch of DePuy 1 cement with 4.8 g of tobramycin and 4 g of  vancomycin mixed in.  A portion was fashioned into a hemisphere and inserted into the acetabulum and hardened under pressure another portion was fashioned into a cone and inserted into the proximal femur.  The cement was allowed to harden.  The wound was once again irrigated out with pulse lavage.  We closed the IT band with running #1 Vicryl suture and applied a large wound VAC in standard fashion.  The patient was unclamped awaken extubated and taken to the recovery room without difficulty.  We will check a hemoglobin in a few hours.

## 2018-06-03 NOTE — Transfer of Care (Signed)
Immediate Anesthesia Transfer of Care Note  Patient: Alejandra [name]  Procedure(s) Performed: RIGHT HIP RESECTION WITH HARDWARE REMOVAL (Right Hip) APPLICATION OF WOUND VAC (Right Hip) IRRIGATION AND DEBRIDEMENT OF RIGHT HIP (Right Hip)  Patient Location: PACU  Anesthesia Type:General  Level of Consciousness: sedated  Airway & Oxygen Therapy: Patient Spontanous Breathing and Patient connected to face mask oxygen  Post-op Assessment: Report given to RN and Post -op Vital signs reviewed and stable  Post vital signs: Reviewed and stable  Last Vitals:  Vitals Value Taken Time  BP 119/78 06/03/2018  6:06 PM  Temp    Pulse 100 06/03/2018  6:11 PM  Resp 16 06/03/2018  6:11 PM  SpO2 94 % 06/03/2018  6:11 PM  Vitals shown include unvalidated device data.  Last Pain:  Vitals:   06/03/18 1254  TempSrc: Oral         Complications: No apparent anesthesia complications

## 2018-06-03 NOTE — Anesthesia Preprocedure Evaluation (Addendum)
Anesthesia Evaluation  Patient identified by MRN, date of birth, ID band Patient awake    Reviewed: Allergy & Precautions, NPO status , Patient's Chart, lab work & pertinent test results  Airway Mallampati: II  TM Distance: >3 FB Neck ROM: Full    Dental  (+) Teeth Intact, Dental Advisory Given, Missing, Chipped,    Pulmonary former smoker,    breath sounds clear to auscultation       Cardiovascular hypertension, Pt. on medications  Rhythm:Regular Rate:Normal     Neuro/Psych    GI/Hepatic negative GI ROS, Neg liver ROS,   Endo/Other  negative endocrine ROS  Renal/GU      Musculoskeletal  (+) Arthritis ,   Abdominal Normal abdominal exam  (+)   Peds  Hematology negative hematology ROS (+)   Anesthesia Other Findings   Reproductive/Obstetrics                           Lab Results  Component Value Date   WBC 33.3 (H) 06/03/2018   HGB 10.0 (L) 06/03/2018   HCT 35.2 (L) 06/03/2018   MCV 83.6 06/03/2018   PLT 478 (H) 06/03/2018   Lab Results  Component Value Date   CREATININE 0.65 06/03/2018   BUN 10 06/03/2018   NA 144 06/03/2018   K 3.4 (L) 06/03/2018   CL 111 06/03/2018   CO2 25 06/03/2018   Lab Results  Component Value Date   INR 1.32 06/03/2018   INR 1.50 04/28/2018   INR 1.56 04/26/2018   EKG: normal sinus rhythm.  Echo: - Left ventricle: The cavity size was mildly dilated. Systolic   function was normal. The estimated ejection fraction was in the   range of 50% to 55%. Wall motion was normal; there were no   regional wall motion abnormalities. Left ventricular diastolic   function parameters were normal. - Aortic valve: There was very mild stenosis. There was no   significant regurgitation. Valve area (VTI): 1.38 cm^2. - Mitral valve: There was trivial regurgitation. - Right ventricle: Systolic function was normal. - Atrial septum: No defect or patent foramen ovale  was identified. - Tricuspid valve: There was mild regurgitation. - Pulmonic valve: There was no significant regurgitation. - Pulmonary arteries: Systolic pressure was mildly increased. PA   peak pressure: 34 mm Hg (S).  Anesthesia Physical Anesthesia Plan  ASA: III  Anesthesia Plan: General   Post-op Pain Management:    Induction: Intravenous  PONV Risk Score and Plan: 4 or greater and Ondansetron, Dexamethasone and Treatment may vary due to age or medical condition  Airway Management Planned: Oral ETT  Additional Equipment: None  Intra-op Plan:   Post-operative Plan: Extubation in OR  Informed Consent: I have reviewed the patients History and Physical, chart, labs and discussed the procedure including the risks, benefits and alternatives for the proposed anesthesia with the patient or authorized representative who has indicated his/her understanding and acceptance.   Dental advisory given  Plan Discussed with: CRNA  Anesthesia Plan Comments:        Anesthesia Quick Evaluation

## 2018-06-04 ENCOUNTER — Inpatient Hospital Stay (HOSPITAL_COMMUNITY): Payer: Medicare HMO | Admitting: Anesthesiology

## 2018-06-04 ENCOUNTER — Encounter (HOSPITAL_COMMUNITY)
Admission: RE | Disposition: A | Discharge status: Hospice | Payer: Self-pay | Source: Ambulatory Visit | Attending: Orthopedic Surgery

## 2018-06-04 ENCOUNTER — Inpatient Hospital Stay (HOSPITAL_COMMUNITY): Payer: Medicare HMO | Admitting: Certified Registered"

## 2018-06-04 DIAGNOSIS — Y838 Other surgical procedures as the cause of abnormal reaction of the patient, or of later complication, without mention of misadventure at the time of the procedure: Secondary | ICD-10-CM

## 2018-06-04 DIAGNOSIS — M96840 Postprocedural hematoma of a musculoskeletal structure following a musculoskeletal system procedure: Secondary | ICD-10-CM

## 2018-06-04 HISTORY — PX: WOUND EXPLORATION: SHX6188

## 2018-06-04 LAB — CBC
HEMATOCRIT: 19.2 % — AB (ref 36.0–46.0)
Hemoglobin: 5.6 g/dL — CL (ref 12.0–15.0)
MCH: 23.9 pg — AB (ref 26.0–34.0)
MCHC: 29.2 g/dL — ABNORMAL LOW (ref 30.0–36.0)
MCV: 82.1 fL (ref 80.0–100.0)
NRBC: 0.1 % (ref 0.0–0.2)
PLATELETS: 717 10*3/uL — AB (ref 150–400)
RBC: 2.34 MIL/uL — AB (ref 3.87–5.11)
RDW: 26.7 % — ABNORMAL HIGH (ref 11.5–15.5)
WBC: 62.3 10*3/uL (ref 4.0–10.5)

## 2018-06-04 LAB — CBC WITH DIFFERENTIAL/PLATELET
Abs Immature Granulocytes: 4.75 10*3/uL — ABNORMAL HIGH (ref 0.00–0.07)
BASOS PCT: 1 %
Basophils Absolute: 0.5 10*3/uL — ABNORMAL HIGH (ref 0.0–0.1)
EOS PCT: 1 %
Eosinophils Absolute: 0.4 10*3/uL (ref 0.0–0.5)
HEMATOCRIT: 26.1 % — AB (ref 36.0–46.0)
HEMOGLOBIN: 7.9 g/dL — AB (ref 12.0–15.0)
Immature Granulocytes: 8 %
LYMPHS ABS: 1.7 10*3/uL (ref 0.7–4.0)
Lymphocytes Relative: 3 %
MCH: 25.2 pg — AB (ref 26.0–34.0)
MCHC: 30.3 g/dL (ref 30.0–36.0)
MCV: 83.4 fL (ref 80.0–100.0)
MONO ABS: 1.6 10*3/uL — AB (ref 0.1–1.0)
MONOS PCT: 3 %
Neutro Abs: 49.8 10*3/uL — ABNORMAL HIGH (ref 1.7–7.7)
Neutrophils Relative %: 84 %
Platelets: 667 10*3/uL — ABNORMAL HIGH (ref 150–400)
RBC: 3.13 MIL/uL — ABNORMAL LOW (ref 3.87–5.11)
RDW: 24.1 % — ABNORMAL HIGH (ref 11.5–15.5)
WBC: 58.8 10*3/uL (ref 4.0–10.5)
nRBC: 0.1 % (ref 0.0–0.2)

## 2018-06-04 LAB — HEMOGLOBIN AND HEMATOCRIT, BLOOD
HCT: 29.2 % — ABNORMAL LOW (ref 36.0–46.0)
HCT: 25.4 % — ABNORMAL LOW (ref 36.0–46.0)
HEMOGLOBIN: 8.1 g/dL — AB (ref 12.0–15.0)
Hemoglobin: 9.3 g/dL — ABNORMAL LOW (ref 12.0–15.0)

## 2018-06-04 LAB — BASIC METABOLIC PANEL
Anion gap: 7 (ref 5–15)
BUN: 15 mg/dL (ref 8–23)
CHLORIDE: 116 mmol/L — AB (ref 98–111)
CO2: 18 mmol/L — AB (ref 22–32)
Calcium: 7.2 mg/dL — ABNORMAL LOW (ref 8.9–10.3)
Creatinine, Ser: 1.07 mg/dL — ABNORMAL HIGH (ref 0.44–1.00)
GFR calc Af Amer: 56 mL/min — ABNORMAL LOW (ref >60)
GFR calc non Af Amer: 48 mL/min — ABNORMAL LOW (ref >60)
GLUCOSE: 220 mg/dL — AB (ref 70–99)
POTASSIUM: 3.5 mmol/L (ref 3.5–5.1)
Sodium: 141 mmol/L (ref 135–145)

## 2018-06-04 LAB — VANCOMYCIN, RANDOM: Vancomycin Rm: 8

## 2018-06-04 LAB — PROTIME-INR
INR: 1.64
PROTHROMBIN TIME: 19.3 s — AB (ref 11.4–15.2)

## 2018-06-04 LAB — PREPARE RBC (CROSSMATCH)

## 2018-06-04 LAB — APTT: aPTT: 31 seconds (ref 24–36)

## 2018-06-04 SURGERY — WOUND EXPLORATION
Anesthesia: General | Site: Hip | Laterality: Right

## 2018-06-04 SURGERY — HEMIARTHROPLASTY, HIP, DIRECT ANTERIOR APPROACH, FOR FRACTURE
Anesthesia: Choice | Laterality: Left

## 2018-06-04 MED ORDER — PHENYLEPHRINE 40 MCG/ML (10ML) SYRINGE FOR IV PUSH (FOR BLOOD PRESSURE SUPPORT)
PREFILLED_SYRINGE | INTRAVENOUS | Status: DC | PRN
  Administered 2018-06-04: 80 ug via INTRAVENOUS
  Administered 2018-06-04: 40 ug via INTRAVENOUS
  Administered 2018-06-04: 80 ug via INTRAVENOUS

## 2018-06-04 MED ORDER — SODIUM CHLORIDE 0.9 % IV BOLUS
300.0000 mL | Freq: Once | INTRAVENOUS | Status: AC
  Administered 2018-06-04: 300 mL via INTRAVENOUS

## 2018-06-04 MED ORDER — SODIUM CHLORIDE 0.9 % IV SOLN
INTRAVENOUS | Status: DC | PRN
  Administered 2018-06-04: 02:00:00 via INTRAVENOUS

## 2018-06-04 MED ORDER — SODIUM CHLORIDE 0.9 % IR SOLN
Status: DC | PRN
  Administered 2018-06-04 (×2): 1000 mL

## 2018-06-04 MED ORDER — CHLORHEXIDINE GLUCONATE 4 % EX LIQD
60.0000 mL | Freq: Once | CUTANEOUS | Status: DC
  Filled 2018-06-04: qty 60

## 2018-06-04 MED ORDER — 0.9 % SODIUM CHLORIDE (POUR BTL) OPTIME
TOPICAL | Status: DC | PRN
  Administered 2018-06-04: 1000 mL

## 2018-06-04 MED ORDER — VANCOMYCIN HCL IN DEXTROSE 750-5 MG/150ML-% IV SOLN
750.0000 mg | INTRAVENOUS | Status: DC
  Administered 2018-06-04: 750 mg via INTRAVENOUS
  Filled 2018-06-04: qty 150

## 2018-06-04 MED ORDER — LIDOCAINE 2% (20 MG/ML) 5 ML SYRINGE
INTRAMUSCULAR | Status: DC | PRN
  Administered 2018-06-04: 100 mg via INTRAVENOUS

## 2018-06-04 MED ORDER — SODIUM CHLORIDE 0.9 % IV BOLUS: 500.0000 mL | Freq: Once | INTRAVENOUS | Status: AC

## 2018-06-04 MED ORDER — ENSURE ENLIVE PO LIQD
237.0000 mL | Freq: Three times a day (TID) | ORAL | Status: DC
  Administered 2018-06-04 – 2018-06-17 (×23): 237 mL via ORAL

## 2018-06-04 MED ORDER — DEXTROSE-NACL 5-0.45 % IV SOLN
INTRAVENOUS | Status: DC
  Administered 2018-06-04: 05:00:00 via INTRAVENOUS

## 2018-06-04 MED ORDER — POVIDONE-IODINE 10 % EX SWAB: 2.0000 "application " | Freq: Once | CUTANEOUS | Status: DC

## 2018-06-04 MED ORDER — ONDANSETRON HCL 4 MG PO TABS: 4.0000 mg | ORAL_TABLET | Freq: Three times a day (TID) | ORAL | Status: DC | PRN

## 2018-06-04 MED ORDER — VITAMIN C 500 MG PO TABS
500.0000 mg | ORAL_TABLET | Freq: Every day | ORAL | Status: DC
  Administered 2018-06-04 – 2018-06-17 (×14): 500 mg via ORAL
  Filled 2018-06-04 (×15): qty 1

## 2018-06-04 MED ORDER — LIDOCAINE 2% (20 MG/ML) 5 ML SYRINGE
INTRAMUSCULAR | Status: AC
  Filled 2018-06-04: qty 5

## 2018-06-04 MED ORDER — PHENYLEPHRINE 40 MCG/ML (10ML) SYRINGE FOR IV PUSH (FOR BLOOD PRESSURE SUPPORT)
PREFILLED_SYRINGE | INTRAVENOUS | Status: AC
  Filled 2018-06-04: qty 10

## 2018-06-04 MED ORDER — HYDROMORPHONE HCL 1 MG/ML IJ SOLN: 0.2500 mg | INTRAMUSCULAR | Status: DC | PRN

## 2018-06-04 MED ORDER — TRANEXAMIC ACID-NACL 1000-0.7 MG/100ML-% IV SOLN: 1000.0000 mg | Freq: Once | INTRAVENOUS | Status: DC

## 2018-06-04 MED ORDER — SUGAMMADEX SODIUM 200 MG/2ML IV SOLN
INTRAVENOUS | Status: AC
  Filled 2018-06-04: qty 2

## 2018-06-04 MED ORDER — SUGAMMADEX SODIUM 200 MG/2ML IV SOLN
INTRAVENOUS | Status: DC | PRN
  Administered 2018-06-04: 125 mg via INTRAVENOUS

## 2018-06-04 MED ORDER — ROCURONIUM BROMIDE 10 MG/ML (PF) SYRINGE
PREFILLED_SYRINGE | INTRAVENOUS | Status: DC | PRN
  Administered 2018-06-04: 40 mg via INTRAVENOUS

## 2018-06-04 MED ORDER — PROMETHAZINE HCL 25 MG/ML IJ SOLN: 6.2500 mg | INTRAMUSCULAR | Status: DC | PRN

## 2018-06-04 MED ORDER — HYDROCODONE-ACETAMINOPHEN 7.5-325 MG PO TABS: 1.0000 | ORAL_TABLET | Freq: Once | ORAL | Status: DC | PRN

## 2018-06-04 MED ORDER — SODIUM CHLORIDE 0.9% IV SOLUTION
Freq: Once | INTRAVENOUS | Status: AC
  Administered 2018-06-04: 10:00:00 via INTRAVENOUS

## 2018-06-04 MED ORDER — VANCOMYCIN VARIABLE DOSE PER UNSTABLE RENAL FUNCTION (PHARMACIST DOSING): Status: DC

## 2018-06-04 MED ORDER — SODIUM CHLORIDE 0.9% FLUSH
10.0000 mL | Freq: Two times a day (BID) | INTRAVENOUS | Status: DC
  Administered 2018-06-04 – 2018-06-17 (×11): 10 mL via INTRAVENOUS
  Filled 2018-06-04 (×4): qty 10

## 2018-06-04 MED ORDER — ETOMIDATE 2 MG/ML IV SOLN
INTRAVENOUS | Status: DC | PRN
  Administered 2018-06-04: 14 mg via INTRAVENOUS

## 2018-06-04 MED ORDER — SODIUM CHLORIDE 0.9% IV SOLUTION: Freq: Once | INTRAVENOUS | Status: DC

## 2018-06-04 MED ORDER — BENAZEPRIL HCL 20 MG PO TABS
40.0000 mg | ORAL_TABLET | Freq: Every day | ORAL | Status: DC
  Administered 2018-06-05: 40 mg via ORAL
  Filled 2018-06-04 (×2): qty 2

## 2018-06-04 MED ORDER — MEPERIDINE HCL 50 MG/ML IJ SOLN: 6.2500 mg | INTRAMUSCULAR | Status: DC | PRN

## 2018-06-04 MED ORDER — ETOMIDATE 2 MG/ML IV SOLN
INTRAVENOUS | Status: AC
  Filled 2018-06-04: qty 10

## 2018-06-04 MED ORDER — PHYTONADIONE 5 MG PO TABS
10.0000 mg | ORAL_TABLET | Freq: Two times a day (BID) | ORAL | Status: AC
  Administered 2018-06-04 – 2018-06-05 (×3): 10 mg via ORAL
  Filled 2018-06-04 (×3): qty 2

## 2018-06-04 SURGICAL SUPPLY — 62 items
BAG SPEC THK2 15X12 ZIP CLS (MISCELLANEOUS) ×1
BAG ZIPLOCK 12X15 (MISCELLANEOUS) ×2 IMPLANT
BIT DRILL 2.4X128 (BIT) ×1 IMPLANT
BLADE EXTENDED COATED 6.5IN (ELECTRODE) ×2 IMPLANT
BLADE HEX COATED 2.75 (ELECTRODE) ×2 IMPLANT
BLADE SAW SAG 73X25 THK (BLADE) ×1
BLADE SAW SGTL 73X25 THK (BLADE) ×1 IMPLANT
BRUSH FEMORAL CANAL (MISCELLANEOUS) ×2 IMPLANT
COVER SURGICAL LIGHT HANDLE (MISCELLANEOUS) ×2 IMPLANT
COVER WAND RF STERILE (DRAPES) IMPLANT
DRAPE INCISE IOBAN 66X45 STRL (DRAPES) IMPLANT
DRAPE ORTHO SPLIT 77X108 STRL (DRAPES) ×4
DRAPE POUCH INSTRU U-SHP 10X18 (DRAPES) ×2 IMPLANT
DRAPE SHEET LG 3/4 BI-LAMINATE (DRAPES) ×2 IMPLANT
DRAPE SURG ORHT 6 SPLT 77X108 (DRAPES) ×2 IMPLANT
DRAPE U-SHAPE 47X51 STRL (DRAPES) ×2 IMPLANT
DRILL BIT 7/64X5 (BIT) ×2 IMPLANT
DRSG MEPILEX BORDER 4X4 (GAUZE/BANDAGES/DRESSINGS) ×2 IMPLANT
DRSG MEPILEX BORDER 4X8 (GAUZE/BANDAGES/DRESSINGS) ×2 IMPLANT
DRSG PAD ABDOMINAL 8X10 ST (GAUZE/BANDAGES/DRESSINGS) ×2 IMPLANT
ELECT REM PT RETURN 15FT ADLT (MISCELLANEOUS) ×2 IMPLANT
EVACUATOR 1/8 PVC DRAIN (DRAIN) ×2 IMPLANT
FACESHIELD WRAPAROUND (MASK) ×4 IMPLANT
FACESHIELD WRAPAROUND OR TEAM (MASK) ×2 IMPLANT
GAUZE SPONGE 4X4 12PLY STRL (GAUZE/BANDAGES/DRESSINGS) ×3 IMPLANT
GLOVE BIO SURGEON STRL SZ7.5 (GLOVE) ×2 IMPLANT
GLOVE BIO SURGEON STRL SZ8.5 (GLOVE) ×2 IMPLANT
GLOVE BIOGEL PI IND STRL 8 (GLOVE) ×1 IMPLANT
GLOVE BIOGEL PI IND STRL 9 (GLOVE) ×1 IMPLANT
GLOVE BIOGEL PI INDICATOR 8 (GLOVE) ×1
GLOVE BIOGEL PI INDICATOR 9 (GLOVE) ×1
HANDPIECE INTERPULSE COAX TIP (DISPOSABLE) ×2
HOOD PEEL AWAY FLYTE STAYCOOL (MISCELLANEOUS) ×4 IMPLANT
IV NS IRRIG 3000ML ARTHROMATIC (IV SOLUTION) ×2 IMPLANT
KIT BASIN OR (CUSTOM PROCEDURE TRAY) ×2 IMPLANT
NDL MAYO CATGUT SZ4 TPR NDL (NEEDLE) IMPLANT
NEEDLE HYPO 22GX1.5 SAFETY (NEEDLE) ×4 IMPLANT
NEEDLE MAYO CATGUT SZ4 (NEEDLE) IMPLANT
NS IRRIG 1000ML POUR BTL (IV SOLUTION) ×2 IMPLANT
PACK TOTAL JOINT (CUSTOM PROCEDURE TRAY) ×2 IMPLANT
PAD ABD 7.5X8 STRL (GAUZE/BANDAGES/DRESSINGS) ×1 IMPLANT
PASSER SUT SWANSON 36MM LOOP (INSTRUMENTS) ×1 IMPLANT
POSITIONER SURGICAL ARM (MISCELLANEOUS) ×2 IMPLANT
PRESSURIZER FEMORAL UNIV (MISCELLANEOUS) ×2 IMPLANT
SET HNDPC FAN SPRY TIP SCT (DISPOSABLE) ×1 IMPLANT
STAPLER VISISTAT 35W (STAPLE) ×2 IMPLANT
SUCTION FRAZIER HANDLE 10FR (MISCELLANEOUS) ×1
SUCTION TUBE FRAZIER 10FR DISP (MISCELLANEOUS) ×1 IMPLANT
SUT ETHIBOND 2 V 37 (SUTURE) ×4 IMPLANT
SUT ETHILON 2 0 PSLX (SUTURE) ×2 IMPLANT
SUT NYLON 3 0 (SUTURE) IMPLANT
SUT VIC AB 0 CT1 36 (SUTURE) IMPLANT
SUT VIC AB 0 CTX 27 (SUTURE) ×4 IMPLANT
SUT VIC AB 1 CTX 36 (SUTURE) ×4
SUT VIC AB 1 CTX36XBRD ANBCTR (SUTURE) ×2 IMPLANT
SUT VIC AB 2-0 CT1 27 (SUTURE) ×4
SUT VIC AB 2-0 CT1 TAPERPNT 27 (SUTURE) ×2 IMPLANT
SYR CONTROL 10ML LL (SYRINGE) ×6 IMPLANT
TAPE CLOTH SURG 4X10 WHT LF (GAUZE/BANDAGES/DRESSINGS) ×1 IMPLANT
TOWER CARTRIDGE SMART MIX (DISPOSABLE) ×2 IMPLANT
TRAY CATH 16FR W/PLASTIC CATH (SET/KITS/TRAYS/PACK) ×2 IMPLANT
WATER STERILE IRR 1000ML POUR (IV SOLUTION) ×2 IMPLANT

## 2018-06-04 NOTE — Transfer of Care (Signed)
Immediate Anesthesia Transfer of Care Note  Patient: Alejandra [name]  Procedure(s) Performed: CONTROL BLEEDING POST-OP LEFT HIP (Left )  Patient Location: PACU  Anesthesia Type:General  Level of Consciousness: awake, alert  and oriented  Airway & Oxygen Therapy: Patient Spontanous Breathing and Patient connected to face mask oxygen  Post-op Assessment: Report given to RN and Post -op Vital signs reviewed and stable  Post vital signs: Reviewed and stable  Last Vitals:  Vitals Value Taken Time  BP    Temp    Pulse    Resp 11 06/04/2018  3:32 AM  SpO2    Vitals shown include unvalidated device data.  Last Pain:  Vitals:   06/04/18 0000  TempSrc: Oral  PainSc:          Complications: No apparent anesthesia complications

## 2018-06-04 NOTE — Progress Notes (Signed)
Patient has had steady increase in heart rate and has periods of ST 140s-160s. Patient not in pain after pain medication and has received both units of blood. Low output from drains. MD paged to update and 322mL bolus ordered and given. RN to continue to monitor.

## 2018-06-04 NOTE — Consult Note (Addendum)
WOC consult ordered for Vac; called Dr Mayer Camel of the ortho team to clarify the plan of care.  Vac had large amt bleeding yesterday and was stopped.  Pt has a chronic bleeding disorder and this will probably impair use of a Vac, since the filter clogs and the suction is discontinued on the machine when blood clots are detected.  Ortho team is following for assessment and plan of care and pt has a dressing in place at this time and Vac is not desired to be applied, according to the PA via phone call. Please re-consult if further assistance is needed.  Thank-you,  Julien Girt MSN, [place], [place], [place], [place]

## 2018-06-04 NOTE — Progress Notes (Signed)
10/22 @ 0040 Dr. Mayer Camel at bedside to evaluate bleeding from R hip.  Updated him on situation.  Dr. Mayer Camel made the decision to take patient back to OR to find source of bleeding.  At [place], pt took to OR holding area via bed, by me and a PACU nurse.  I advised OR team that consent had not been obtained yet, Peggy, CRNA advised me that they would take care of that.  Jacqulyn Ducking ICU/SD Care Coordinator / Rapid Response Nurse Rapid Response Number:  [phone]

## 2018-06-04 NOTE — Progress Notes (Addendum)
IP PROGRESS NOTE  Subjective:   Alejandra [name] is well-known to me with a history of polycythemia vera.  She is currently maintained on anagrelide for control of thrombocytosis. She was admitted yesterday for repeat surgery secondary to infection of the revised right hip arthroplasty.  She was taken to the operating room 06/03/2018 for irrigation and debridement of an infected right hip with removal of all hardware.  She developed bleeding following surgery and was transfused with packed red blood cells.  She was evaluated by Dr. Mayer Camel and noted to have bleeding from the wound with dark clotted material on the wound VAC. She was taken back to the operating room early this morning and underwent wound exploration with evacuation of a large hematoma.  Small bleeders were cauterized inferior to the acetabulum.  There was oozing.  He was transfused 1 unit of single donor platelets.  Hemovacs were placed He continues to receive packed red blood cells.  The ICU nursing staff reports the bleeding has slowed. Alejandra [name] reports having pain at the right hip, none at present. Objective: Vital signs in last 24 hours: Blood pressure 137/68, pulse (!) 103, temperature (!) 97.5 F (36.4 C), temperature source Oral, resp. rate 16, height 5\' 4"  (1.626 m), weight 123 lb (55.8 kg), SpO2 100 %.  Intake/Output from previous day: 10/21 0701 - 10/22 0700 In: 4856.1 [P.O.:120; I.V.:3664.4; Blood:861.7] Out: 1967 [Urine:167; Drains:1000; Blood:800]  Physical Exam:  HEENT: No thrush Lungs: Clear anteriorly Cardiac: Regular rate and rhythm Abdomen: No hepatosplenomegaly Extremities: No leg edema Musculoskeletal: Right thigh gauze dressing is dry.  Small amount of blood in each of the 2 Hemovacs.     Lab Results: Recent Labs    06/04/18 0123 06/04/18 0602  WBC 58.8* 62.3*  HGB 7.9* 5.6*  HCT 26.1* 19.2*  PLT 667* 717*    BMET Recent Labs    06/03/18 1304 06/04/18 0602  NA 144 141  K 3.4* 3.5  CL 111  116*  CO2 25 18*  GLUCOSE 80 220*  BUN 10 15  CREATININE 0.65 1.07*  CALCIUM 8.7* 7.2*     Medications: I have reviewed the patient's current medications.  Assessment/Plan:  1. Polycythemia vera on hydroxyurea. Last phlebotomy 09/08/2015  Hydroxyurea dose increased to 1500 mg daily 06/26/2016  Hydroxyurea changed to 1500 mg daily on Monday through Friday, 1000 mg on Saturday and Sunday beginning 09/11/2016  Hydroxyurea changed to 1500 mg Monday Wednesday and Friday and 1000 mg other days beginning 10/10/2016  Hydroxyurea changed to 1000 mg daily beginning 10/31/2016  Hydroxyurea placed on hold beginning 11/27/2016-she misunderstood our instructions and continued 1000 mg daily.  Hydroxyurea dose decreased to 500 mg daily beginning 01/01/2017  Hydroxyurea dose increased 06/05/2017  Hydroxyurea dose increased to 1000 mg daily except for 500 mg on Monday, Wednesday, and Friday, 11/01/2017  Hydroxyurea discontinued 04/25/2018  Anagrelide started 04/25/2018 2. Hypertension, followed by Dr. Montez Morita.  3. History of hyperpigmentation at the hands. Question related to hydroxyurea. 4. Hip fracture February 2013. She underwent a bipolar hip arthroplasty on 04/23/2012. 5. Thrombocytosis. Most likely secondary to polycythemia vera and iron deficiency.  Improved 6. Hospitalization with left shoulder cellulitis February 2016. 7. Pelvic fracture after a fall 05/31/2015 8. Weight loss-etiology unclear; weight stable 12/16/2015 and06/15/2017 9. Right anterior buccal ulcer 03/09/2016 10. Revision of right hip arthroplasty 12/20/3265, complicated by intraoperative and postoperative bleeding requiring multiple red cell transfusions 11. Swelling at the right hip surgical site week of 04/08/2018- 220 cc bloody fluid aspirated from the  right hip surgical site 04/11/2018  Right thigh wound dehiscence prompting readmission 04/23/2018  CT 04/23/2018- gas/fluid collection at the lateral right hip with  area of focal cortical destruction concerning for infection  Irrigation/debridement and replacement of right hip hardware 04/24/2018  Persistent sinus tract prompting removal of the right hip hardware and wound debridement on 06/03/2018  Postoperative bleeding prompting wound exploration, cauterization of "bleeders ", placement of Hemovacs, and a platelet transfusion in the early a.m. on 06/04/2018 12. Coagulopathy 13. Anemia secondary to surgical blood loss 14.Fever/cough, chest x-ray 05/01/2018 suggestive of pneumonia  Alejandra [name] currently admitted following removal of right hip hardware and wound debridement/evacuation of a hematoma.  She was noted to have postoperative bleeding this morning.  The bleeding appears to have slowed based on the decreased output from the Hemovacs.  She is receiving a red cell transfusion at present. She is at risk for bleeding secondary to surgery, coagulopathy, and probable platelet dysfunction.  The platelet count was adequately told with anagrelide prior to hospital admission.  The elevated platelet count today is likely secondary to acute inflammation.  Recommendations: 1.  Transfuse packed red blood cells until hemoglobin stabilizes 2.   DDAVP and platelet transfusion for persistent bleeding 3.   Vitamin K 4.   Can you anagrelide  I will check on her in the a.m. 06/05/2018   LOS: 1 day   Betsy Coder, MD   06/04/2018, 1:46 PM

## 2018-06-04 NOTE — Significant Event (Signed)
Rapid Response Event Note  Overview: Time Called: 1120 Arrival Time: 1130 Event Type: Hypotension  Initial Focused Assessment:  Called in reference to hypotension, low oxygen saturations, and a 2-3 minute loss of LOC.  Arrived to find pt laying in bed, alert and oriented to name and place. Patient is already know to me from a earlier rapid response call at 2106 for low BP, ? Vagal response, which patient responded well to a 500 NS bolus.  Now patient with SBP in 80s, after 1 liter of NS given.  Pt now having increased bloody drainage from R hip incision, wound vac in place with 1178ml of drainage since 2100.  Due to patient's increased acuity decision made to move to [employer] [place] with [facility] was notified by primary RN.  Orders given to remove wound vac and hold pressure. Another 557ml bolus of NS started.  Once patient in SDU, wound vac removed, direct pressure applied to site with abd pads. Dr. Mayer Camel was called by the PA to come and evaluate patient.  VS stable at present.    Interventions:  Total of 1.5 liter NS bolus, Transfer to SDU, MD to come to bedside and evaluate   Plan of Care (if not transferred):  Transferred to SDU  Event Summary: Name of Physician Notified: Dr. Mayer Camel at 2320    at    Outcome: Transferred (Comment)(transferred to SDU Rm 1229)  Event End Time: [facility]/SD Care Coordinator / Rapid Response Nurse Rapid Response Number:  [phone]

## 2018-06-04 NOTE — Progress Notes (Signed)
PT Cancellation Note  Patient Details Name: Alejandra [name] MRN: [id number] DOB: [birth year]   Cancelled Treatment:       RN reports pt has been receiving blood today however surgical site still bleeding.  RN also reports pt more confused and not yet ready for therapy at this time. Will check back as schedule permits.   Lovene Maret,KATHrine E 06/04/2018, 3:39 PM Carmelia Bake, PT, DPT Acute Rehabilitation Services Office: [phone] Pager: [phone]

## 2018-06-04 NOTE — Progress Notes (Signed)
PT Cancellation Note  Patient Details Name: Alejandra [name] MRN: [id number] DOB: [birth year]   Cancelled Treatment:    Reason Eval/Treat Not Completed: Medical issues which prohibited therapy(low Hgb, orders for transfusion, will check back)   Rye Decoste,KATHrine E 06/04/2018, 9:12 AM Carmelia Bake, PT, DPT Acute Rehabilitation Services Office: [phone] Pager: [phone]

## 2018-06-04 NOTE — Anesthesia Postprocedure Evaluation (Signed)
Anesthesia Post Note  Patient: Alejandra [name]  Procedure(s) Performed: CONTROL BLEEDING POST-OP LEFT HIP (Left )     Patient location during evaluation: PACU Anesthesia Type: General Level of consciousness: awake and sedated Pain management: pain level controlled Vital Signs Assessment: post-procedure vital signs reviewed and stable Respiratory status: spontaneous breathing Cardiovascular status: stable Postop Assessment: no apparent nausea or vomiting Anesthetic complications: no    Last Vitals:  Vitals:   06/04/18 0115 06/04/18 0130  BP: 103/72 108/66  Pulse:    Resp: (!) 23 (!) 23  Temp:    SpO2:      Last Pain:  Vitals:   06/04/18 0000  TempSrc: Oral  PainSc:    Pain Goal:                 Huston Foley

## 2018-06-04 NOTE — Consult Note (Signed)
Called at 1147 this evening and nurse reported that the patient has significant bleeding into the back since surgery.  Approximately 1100 cc of blood loss with continued bleeding.  She had contacted the physician assistant on-call there was a discussion and the VAC was stopped and pressure dressing applied however the bleeding continued.  She actually had an brief episode of loss of consciousness rapid response was called and she is in the stepdown unit at [facility].  A hemoglobin was obtained around 8:00 came back at 4.7 but it was in the same region where IV fluids were being given a repeat hemoglobin was 7.6 her preop hemoglobin was 10.  She is receiving packed red blood cells at this time.  Objective: The sponge of the VAC has dark clotted material on it and periodically there is bright red blood coming up from deep in the wound.  I observe this for about 5 or 10 minutes.  At surgery we had very little arterial bleeding.  The wound is relatively dry when we closed it.  Assessment: Postoperative hemorrhage there is persistent and is resulted in 1100 cc blood loss.  Plan: Findings were discussed with the patient and her sister we will take her back to the operating room to explore the wound and look for any significant arterial bleeders because of her diagnosis of polycythemia vera will probably hold off on the wound VAC for a day or so and simply packed the wound in the OR tonight.  Will contact hematology regarding maneuvers to help with her blood clotting as she does have polycythemia vera with high platelet counts but she has had some persistent bleeding after previous procedures as well.

## 2018-06-04 NOTE — Progress Notes (Signed)
PATIENT ID: NAKEYSHA PASQUAL  MRN: [id number]  DOB/AGE:  04/09/1941 / 77 y.o.  Day of Surgery Procedure(s) (LRB): WOUND EXPLORATION evacuation hematoma (Right)    PROGRESS NOTE Subjective: Patient is alert, oriented, no Nausea, no Vomiting, yes passing gas, . Taking PO sips. Denies SOB, Chest or Calf Pain. Using Incentive Spirometer, PAS in place. Ambulate N/A Patient reports pain as  3/10.  Patient seen in stepdown unit she is postop after having evacuation of a hematoma at 2 AM last night following removal of prosthesis and placement of a VAC yesterday.  She now has 4 Hemovac drains to deep into superficial.  At surgery she did receive 1 unit of single donor platelets.  She did put out 250 cc of urine in the last 3 hours. .    Objective: Vital signs in last 24 hours: Vitals:   06/04/18 0600 06/04/18 0615 06/04/18 0630 06/04/18 0700  BP: (Abnormal) 71/43 (Abnormal) 94/55 114/62 110/61  Pulse:      Resp: (Abnormal) 21 16 17 15   Temp:      TempSrc:      SpO2:      Weight:      Height:          Intake/Output from previous day: I/O last 3 completed shifts: In: 4541.1 [P.O.:120; I.V.:3664.4; Blood:546.7; Other:210] Out: 1967 [Urine:167; Drains:1000; Blood:800]   Intake/Output this shift: No intake/output data recorded.   LABORATORY DATA: Recent Labs    06/03/18 1304  06/04/18 0123 06/04/18 0602  WBC 33.3*  --  58.8* 62.3*  HGB 10.0*   < > 7.9* 5.6*  HCT 35.2*   < > 26.1* 19.2*  PLT 478*  --  667* 717*  NA 144  --   --  141  K 3.4*  --   --  3.5  CL 111  --   --  116*  CO2 25  --   --  18*  BUN 10  --   --  15  CREATININE 0.65  --   --  1.07*  GLUCOSE 80  --   --  220*  INR 1.32  --  1.64  --   CALCIUM 8.7*  --   --  7.2*   < > = values in this interval not displayed.    Examination: Neurologically intact ABD soft Neurovascular intact Sensation intact distally Intact pulses distally Dorsiflexion/Plantar flexion intact Incision: dressing C/D/I No cellulitis  present Compartment soft} XR AP&Lat of hip shows well placed\fixed THA  Assessment:   Day of Surgery Procedure(s) (LRB): WOUND EXPLORATION evacuation hematoma (Right) ADDITIONAL DIAGNOSIS:  Expected Acute Blood Loss Anemia, polyclonal polycythemia vera with platelet dysfunction and chronically elevated white cell counts.  Plan: PT/OT WBAT, THA We will consult hematology and infectious diseases today.  DVT Prophylaxis: SCDx72 hrs, no chemoprophylaxis secondary to chronic bleeding disorder DISCHARGE PLAN: Skilled Nursing Facility/Rehab  DISCHARGE NEEDS: HHPT, Walker and 3-in-1 comode seatPatient ID: MERI PELOT, female   DOB: 1941-08-09, 77 y.o.   MRN: [id number]

## 2018-06-04 NOTE — Progress Notes (Signed)
CSW aware patient arrived from [facility]. Patient will need a new PT evaluation for insurance authorization prior to discharge.   CSW following patient for discharge needs.  Stephanie Acre, [place] Social Worker [phone]

## 2018-06-04 NOTE — Progress Notes (Signed)
Pt alarm was going off in room, upon entering, pt was slumped over the side of the bed unconscious and oxygen saturation at 73, per continuous oxygen monitor. I immediately went to patient and called for help. The patient regained consciousness quickly and a rapid response was called another RN came to bedside to assist. Pt oxygen saturation climbed to the 90s after pt regained consciousness and a manual pressure was obtained at 90/65.  RR RN arrived at bedside and pt dressing was saturated. The container for the wound vac was full and changed, prior to patient being transferred to the step down unit. The PA was made aware that the patient was bleeding a lot at 2130, then again at 2330.  Dr Mayer Camel was informed of patient status and the large amount of bleeding. Pt has been moved to step down unit and report given to RN. Julio Alm RN

## 2018-06-04 NOTE — Op Note (Signed)
Pre Op Dx: Uncontrolled hemorrhage after removal of prosthesis from right hip with irrigation and debridement 6 hours ago.  Patient has had 1200 cc blood loss since surgery  Post Op Dx: Same  Procedure: Wound exploration right hip with evacuation of large hematoma  Surgeon: Kerin Salen, MD  Assistant: Kerry Hough. Barton Dubois  (present throughout entire procedure and necessary for timely completion of the procedure)  Anesthesia: General  EBL: 200 cc  Fluids: 2 units packed red blood cells before and during surgery, 1 unit single donor platelets, 1500 cc crystalloid  Indications: 77 year old female with tri-clonal polycythemia vera with significant bleeding disorder.  Had hemi-hip arthroplasty a few years ago with another physician using a cemented prosthesis that came loose over the last year.  I revised her to an uncemented total hip on 02/04/2018 and she had significant postoperative bleeding secondary to her polycythemia vera.  Although 2 sets of cultures off antibiotics were negative the patient had persistent drainage and on 04/24/2018 she had revision of the stem polyethylene exchange and head exchange for a presumed infection.  Because of her polycythemia vera she always has white cell counts between 20 and 40,000.  She has never run a fever.  After the stem and poly-exchange the patient has had persistent drainage and has developed a sinus.  Earlier today she had removal of the prosthesis and placement of a wound VAC.  Since surgery she has had persistent oozing drainage that is filled the VAC 2-1/2 times for a total blood loss of 1500 cc since surgery.  The patient did have an episode of syncope on the floor and was transferred to the stepdown unit.  When I examined her in the stepdown unit there was periodically bright red blood oozing from the wound.  Because of her hypotension and syncope coupled with blood loss she is taken for urgent exploration of the wound evacuation of hematoma and  cauterization of any significant bleeders.  Procedure was discussed with the patient and her sister who agreed with the plan of action.  Procedure: Patient was identified by armband and taken to operating room 9 at [facility] with the appropriate anesthetic monitors were attached and general endotracheal anesthesia was induced.  She was then rolled into the left lateral decubitus position held there was a Stulberg marked to pelvic clamp in the right lower extremity prepped and draped in usual sterile fashion from the ankle to the hemipelvis.  During the prep the patient was continued to ooze blood from the wound.  A timeout procedure was performed we remove the sutures from the old wound closure and immediately encountered a large hematoma of 2 to 300 cc that we evacuated.  I removed the cement spacer the room and placed in the acetabulum and could not find any significant bleeding in the acetabulum.  Some small bleeders were found inferior to the acetabulum and were cauterized with electrocautery.  Overall all the tissues continue to gradually use.  The patient did receive 1 unit of single donor platelets during the procedure.  She also completed her second unit of packed red blood cells during the procedure.  The wound was thoroughly irrigated out with normal saline solution pulse lavage numerous times and again some very small bleeders were cauterized but nothing major was noted.  We then placed a 2 Hemovacs in the deep portion of the wound one down the shaft of the femur and one in the acetabulum and close the IT band with  running #1 Vicryl suture.  I then injected with normal saline into the acetabulum to verify that both of the drains were working well under suction.  We then placed 2 subcutaneous drains one deep and one superficial we closed the subcutaneous tissue over the deep drain with 0 Vicryl suture and then closed over the superficial subcutaneous drain with 2-0 Vicryl suture.   All 4 of the drain tubes were sewed in.  A dressing of 4 x 4's and tape was then applied to the lateral thigh and femur.  The patient was then unclamped rolled supine awaken extubated and taken to the recovery room without difficulty.

## 2018-06-04 NOTE — Anesthesia Procedure Notes (Signed)
Procedure Name: Intubation Date/Time: 06/04/2018 1:52 AM Performed by: Shawnta Zimbelman D, CRNA Pre-anesthesia Checklist: Patient identified, Emergency Drugs available, Suction available and Patient being monitored Patient Re-evaluated:Patient Re-evaluated prior to induction Oxygen Delivery Method: Circle system utilized Preoxygenation: Pre-oxygenation with 100% oxygen Induction Type: IV induction Ventilation: Mask ventilation without difficulty Laryngoscope Size: Mac and 4 Grade View: Grade I Tube type: Oral Tube size: 7.0 mm Number of attempts: 1 Airway Equipment and Method: Stylet and Oral airway Placement Confirmation: ETT inserted through vocal cords under direct vision,  positive ETCO2 and breath sounds checked- equal and bilateral Secured at: 20 cm Tube secured with: Tape Dental Injury: Teeth and Oropharynx as per pre-operative assessment

## 2018-06-04 NOTE — Anesthesia Preprocedure Evaluation (Deleted)
Anesthesia Evaluation  Patient identified by MRN, date of birth, ID band Patient confused    Reviewed: Allergy & Precautions, NPO status , Patient's Chart, lab work & pertinent test results  Airway Mallampati: II       Dental  (+) Teeth Intact, Dental Advisory Given, Missing, Chipped,    Pulmonary former smoker,    breath sounds clear to auscultation       Cardiovascular hypertension, Pt. on medications  Rhythm:Regular Rate:Tachycardia     Neuro/Psych    GI/Hepatic negative GI ROS, Neg liver ROS,   Endo/Other  negative endocrine ROS  Renal/GU      Musculoskeletal  (+) Arthritis ,   Abdominal Normal abdominal exam  (+)   Peds  Hematology negative hematology ROS (+)   Anesthesia Other Findings Alejandra [name]  ECHO COMPLETE WO IMAGING ENHANCING AGENT  Order# [id number]  Reading physician: Buford Dresser, MD Ordering physician: Mendel Corning, MD Study date: 04/12/18 Study Result   Result status: Final result                             *[facility] [employer].                        [place], [place] [address]                            [phone]  ------------------------------------------------------------------- Transthoracic Echocardiography  Patient:    Alejandra [name], Alejandra [name] MR #:       [id number] Study Date: 04/12/2018 Gender:     F Age:        77 Height:     162.6 cm Weight:     51.3 kg BSA:        1.52 m^2 Pt. Status: Room:       1418   ADMITTING    Rai, Ripudeep K  ATTENDING    Rai, Ripudeep K  ORDERING     Rai, Ripudeep K  REFERRING    Rai, Ripudeep K  PERFORMING   [facility], Inpatient  SONOGRAPHER  Jannett Celestine, RDCS  cc:  ------------------------------------------------------------------- LV EF: 50% -   55%   Alejandra [name]  GATED SPECT MYO PERF [facility] STRESS 1D  Order# [id number]  Reading  physician: Lelon Perla, MD Ordering physician: Jenean Lindau, MD Study date: 02/01/18 Patient Information   Name MRN Description Alejandra [name] [id number] 77 y.o. female Result Notes for MYOCARDIAL PERFUSION IMAGING   Notes recorded by Mattie Marlin, RN on 02/05/2018 at 3:58 PM EDT Letter has been mailed for the patient to call the office regarding results. ------  Notes recorded by Jenean Lindau, MD on 02/01/2018 at 3:45 PM EDT Overall the test looks unremarkable. The ejection fraction is a minor concern and he would like to get an echo to understand this further. Jenean Lindau, MD 02/01/2018 3:45 PM   Vitals   Height Weight BMI (Calculated) 5\' 4"  (1.626 m) 52.6 kg 19.9 Study Highlights     The left ventricular ejection fraction is mildly decreased (45-54%).  Nuclear stress EF: 48%.  There was no ST segment deviation noted during stress.  This is a low risk study.   Abnormal, low risk stress nuclear study with no ischemia or infarction; EF 48 with mild global hypokinesis and mild LVE.     Reproductive/Obstetrics                          Lab Results  Component Value Date   WBC 33.3 (H) 06/03/2018   HGB 7.6 (L) 06/03/2018   HCT 27.5 (L) 06/03/2018   MCV 83.6 06/03/2018   PLT 478 (H) 06/03/2018   Lab Results  Component Value Date   CREATININE 0.65 06/03/2018   BUN 10 06/03/2018   NA 144 06/03/2018   K 3.4 (L) 06/03/2018   CL 111 06/03/2018   CO2 25 06/03/2018   Lab Results  Component Value Date   INR 1.32 06/03/2018   INR 1.50 04/28/2018   INR 1.56 04/26/2018   EKG: normal sinus rhythm.  Echo: - Left ventricle: The cavity size was mildly dilated. Systolic   function was normal. The estimated ejection fraction was in the   range of 50% to 55%. Wall motion was normal; there were no   regional wall motion abnormalities. Left ventricular diastolic   function parameters were normal. - Aortic valve: There was very  mild stenosis. There was no   significant regurgitation. Valve area (VTI): 1.38 cm^2. - Mitral valve: There was trivial regurgitation. - Right ventricle: Systolic function was normal. - Atrial septum: No defect or patent foramen ovale was identified. - Tricuspid valve: There was mild regurgitation. - Pulmonic valve: There was no significant regurgitation. - Pulmonary arteries: Systolic pressure was mildly increased. PA   peak pressure: 34 mm Hg (S).  Anesthesia Physical  Anesthesia Plan  ASA: III and emergent  Anesthesia Plan: General   Post-op Pain Management:    Induction: Intravenous  PONV Risk Score and Plan: 4 or greater and Ondansetron, Dexamethasone and Treatment may vary due to age or medical condition  Airway Management Planned: Oral ETT  Additional Equipment: None  Intra-op Plan:   Post-operative Plan: Extubation in OR  Informed Consent: I have reviewed the patients History and Physical, chart, labs and discussed the procedure including the risks, benefits and alternatives for the proposed anesthesia with the patient or authorized representative who has indicated his/her understanding and acceptance.   Dental advisory given  Plan Discussed with: CRNA  Anesthesia Plan Comments:        Anesthesia Quick Evaluation

## 2018-06-04 NOTE — Progress Notes (Addendum)
Pharmacy Antibiotic Note  Alejandra [name] is a 77 y.o. female admitted on 06/03/2018 with recurrent THA infection.  Pharmacy has been consulted for Vancomcyin dosing.  Plan: Ceftazidime per MD Vancomycin dosing pending levels Check vancomycin levels, random level on 10/22.  Goal AUC 400-500. Follow up renal fxn, culture results, and clinical course. F/u ability to de-escalate antibiotics.   Height: 5\' 4"  (162.6 cm) Weight: 123 lb (55.8 kg) IBW/kg (Calculated) : 54.7  Temp (24hrs), Avg:97.7 F (36.5 C), Min:96.7 F (35.9 C), Max:98.4 F (36.9 C)  Recent Labs  Lab 05/31/18 1049 06/03/18 1304 06/04/18 0123 06/04/18 0602  WBC 23.1* 33.3* 58.8* 62.3*  CREATININE  --  0.65  --  1.07*    Estimated Creatinine Clearance: 37.4 mL/min (A) (by C-G formula based on SCr of 1.07 mg/dL (H)).    No Known Allergies  Antimicrobials this admission: Outpatient Vanc and Ceftazidime 9/11 >> 10/18 Inpatient:  Ceftazidime 10/21 >>  Vancomycin 10/21 >>  Augmentin 10/25 >>   Dose adjustments this admission:   Microbiology results: 10/22 MRSA PCR:  10/21 Tissue (right hip@ 1625):  culture re-incubated  10/21 Tissue (right hip@ 9292):  ngtd  Thank you for allowing pharmacy to be a part of this patient's care.  Gretta Arab PharmD, BCPS Pager [phone] 06/04/2018 2:33 PM  Addendum:  Vancomycin random obtained this evening = 8 mcg/mL, obtained 24 hours after initial dose.   Will order vancomycin 750 mg IV q24h (most recent dose patient was on PTA).  Pharmacy to follow renal function, cultures, and check vancomycin levels once at steady state.  Lenis Noon, PharmD 06/04/18 7:16 PM

## 2018-06-04 NOTE — Anesthesia Preprocedure Evaluation (Signed)
Anesthesia Evaluation  Patient identified by MRN, date of birth, ID band Patient awake    Reviewed: Allergy & Precautions, NPO status , Patient's Chart, lab work & pertinent test results  Airway Mallampati: II  TM Distance: >3 FB Neck ROM: Full    Dental  (+) Teeth Intact, Dental Advisory Given, Missing, Chipped,    Pulmonary former smoker,    breath sounds clear to auscultation       Cardiovascular hypertension, Pt. on medications  Rhythm:Regular Rate:Tachycardia     Neuro/Psych    GI/Hepatic negative GI ROS, Neg liver ROS,   Endo/Other  negative endocrine ROS  Renal/GU      Musculoskeletal  (+) Arthritis ,   Abdominal Normal abdominal exam  (+)   Peds  Hematology negative hematology ROS (+)   Anesthesia Other Findings   Reproductive/Obstetrics                             Lab Results  Component Value Date   WBC 58.8 (HH) 06/04/2018   HGB 7.9 (L) 06/04/2018   HCT 26.1 (L) 06/04/2018   MCV 83.4 06/04/2018   PLT 667 (H) 06/04/2018   Lab Results  Component Value Date   CREATININE 0.65 06/03/2018   BUN 10 06/03/2018   NA 144 06/03/2018   K 3.4 (L) 06/03/2018   CL 111 06/03/2018   CO2 25 06/03/2018   Lab Results  Component Value Date   INR 1.64 06/04/2018   INR 1.32 06/03/2018   INR 1.50 04/28/2018   EKG: normal sinus rhythm.  Echo: - Left ventricle: The cavity size was mildly dilated. Systolic   function was normal. The estimated ejection fraction was in the   range of 50% to 55%. Wall motion was normal; there were no   regional wall motion abnormalities. Left ventricular diastolic   function parameters were normal. - Aortic valve: There was very mild stenosis. There was no   significant regurgitation. Valve area (VTI): 1.38 cm^2. - Mitral valve: There was trivial regurgitation. - Right ventricle: Systolic function was normal. - Atrial septum: No defect or patent foramen  ovale was identified. - Tricuspid valve: There was mild regurgitation. - Pulmonic valve: There was no significant regurgitation. - Pulmonary arteries: Systolic pressure was mildly increased. PA   peak pressure: 34 mm Hg (S).  Anesthesia Physical  Anesthesia Plan  ASA: III and emergent  Anesthesia Plan: General   Post-op Pain Management:    Induction: Intravenous  PONV Risk Score and Plan: 4 or greater and Ondansetron, Dexamethasone and Treatment may vary due to age or medical condition  Airway Management Planned: Oral ETT  Additional Equipment: None  Intra-op Plan:   Post-operative Plan: Extubation in OR  Informed Consent: I have reviewed the patients History and Physical, chart, labs and discussed the procedure including the risks, benefits and alternatives for the proposed anesthesia with the patient or authorized representative who has indicated his/her understanding and acceptance.   Dental advisory given  Plan Discussed with: CRNA  Anesthesia Plan Comments:         Anesthesia Quick Evaluation

## 2018-06-05 ENCOUNTER — Encounter (HOSPITAL_COMMUNITY): Payer: Self-pay | Admitting: Orthopedic Surgery

## 2018-06-05 DIAGNOSIS — R011 Cardiac murmur, unspecified: Secondary | ICD-10-CM

## 2018-06-05 DIAGNOSIS — L8990 Pressure ulcer of unspecified site, unspecified stage: Secondary | ICD-10-CM

## 2018-06-05 DIAGNOSIS — Z87440 Personal history of urinary (tract) infections: Secondary | ICD-10-CM

## 2018-06-05 DIAGNOSIS — T8451XA Infection and inflammatory reaction due to internal right hip prosthesis, initial encounter: Principal | ICD-10-CM

## 2018-06-05 DIAGNOSIS — Z87891 Personal history of nicotine dependence: Secondary | ICD-10-CM

## 2018-06-05 LAB — MRSA PCR SCREENING: MRSA by PCR: NEGATIVE

## 2018-06-05 LAB — HEMOGLOBIN AND HEMATOCRIT, BLOOD
HCT: 22.4 % — ABNORMAL LOW (ref 36.0–46.0)
Hemoglobin: 6.9 g/dL — CL (ref 12.0–15.0)

## 2018-06-05 LAB — BASIC METABOLIC PANEL
Anion gap: 9 (ref 5–15)
BUN: 21 mg/dL (ref 8–23)
CHLORIDE: 112 mmol/L — AB (ref 98–111)
CO2: 19 mmol/L — ABNORMAL LOW (ref 22–32)
Calcium: 7.1 mg/dL — ABNORMAL LOW (ref 8.9–10.3)
Creatinine, Ser: 1.53 mg/dL — ABNORMAL HIGH (ref 0.44–1.00)
GFR calc Af Amer: 36 mL/min — ABNORMAL LOW (ref >60)
GFR calc non Af Amer: 31 mL/min — ABNORMAL LOW (ref >60)
GLUCOSE: 123 mg/dL — AB (ref 70–99)
POTASSIUM: 3.9 mmol/L (ref 3.5–5.1)
SODIUM: 140 mmol/L (ref 135–145)

## 2018-06-05 LAB — CBC
HCT: 24.9 % — ABNORMAL LOW (ref 36.0–46.0)
HEMOGLOBIN: 8 g/dL — AB (ref 12.0–15.0)
MCH: 25.9 pg — AB (ref 26.0–34.0)
MCHC: 32.1 g/dL (ref 30.0–36.0)
MCV: 80.6 fL (ref 80.0–100.0)
PLATELETS: 579 10*3/uL — AB (ref 150–400)
RBC: 3.09 MIL/uL — AB (ref 3.87–5.11)
RDW: 21.1 % — ABNORMAL HIGH (ref 11.5–15.5)
WBC: 39.2 10*3/uL — ABNORMAL HIGH (ref 4.0–10.5)
nRBC: 0.3 % — ABNORMAL HIGH (ref 0.0–0.2)

## 2018-06-05 LAB — PREPARE PLATELET PHERESIS: UNIT DIVISION: 0

## 2018-06-05 LAB — BPAM PLATELET PHERESIS
BLOOD PRODUCT EXPIRATION DATE: 201910232359
ISSUE DATE / TIME: 201910220128
Unit Type and Rh: 5100

## 2018-06-05 MED ORDER — VANCOMYCIN HCL IN DEXTROSE 750-5 MG/150ML-% IV SOLN
750.0000 mg | INTRAVENOUS | Status: DC
  Administered 2018-06-06: 750 mg via INTRAVENOUS
  Filled 2018-06-05: qty 150

## 2018-06-05 MED ORDER — CHLORHEXIDINE GLUCONATE CLOTH 2 % EX PADS
6.0000 | MEDICATED_PAD | Freq: Every day | CUTANEOUS | Status: DC
  Administered 2018-06-05 – 2018-06-19 (×8): 6 via TOPICAL

## 2018-06-05 MED ORDER — SODIUM CHLORIDE 0.9% FLUSH
10.0000 mL | Freq: Two times a day (BID) | INTRAVENOUS | Status: DC
  Administered 2018-06-05 – 2018-06-17 (×7): 10 mL

## 2018-06-05 MED ORDER — SODIUM CHLORIDE 0.9 % IV BOLUS
300.0000 mL | Freq: Once | INTRAVENOUS | Status: AC
  Administered 2018-06-05: 300 mL via INTRAVENOUS

## 2018-06-05 MED ORDER — FLUCONAZOLE IN SODIUM CHLORIDE 400-0.9 MG/200ML-% IV SOLN
400.0000 mg | INTRAVENOUS | Status: DC
  Administered 2018-06-05: 400 mg via INTRAVENOUS
  Filled 2018-06-05: qty 200

## 2018-06-05 MED ORDER — SODIUM CHLORIDE 0.9% IV SOLUTION
Freq: Once | INTRAVENOUS | Status: AC
  Administered 2018-06-05: 21:00:00 via INTRAVENOUS

## 2018-06-05 MED ORDER — GABAPENTIN 300 MG PO CAPS
300.0000 mg | ORAL_CAPSULE | Freq: Two times a day (BID) | ORAL | Status: DC
  Administered 2018-06-05 – 2018-06-13 (×15): 300 mg via ORAL
  Filled 2018-06-05 (×16): qty 1

## 2018-06-05 MED ORDER — SODIUM CHLORIDE 0.9 % IV SOLN
2.0000 g | INTRAVENOUS | Status: DC
  Administered 2018-06-06: 2 g via INTRAVENOUS
  Filled 2018-06-05 (×2): qty 2

## 2018-06-05 MED ORDER — SODIUM CHLORIDE 0.9% FLUSH
10.0000 mL | INTRAVENOUS | Status: DC | PRN
  Administered 2018-06-10: 10 mL
  Administered 2018-06-13: 20 mL
  Administered 2018-06-15 – 2018-06-17 (×2): 10 mL
  Filled 2018-06-05 (×4): qty 40

## 2018-06-05 NOTE — Progress Notes (Signed)
Pharmacy Antibiotic Note  Alejandra [name] is a 77 y.o. female admitted on 06/03/2018 with recurrent THA infection.  Pharmacy has been consulted for Vancomcyin dosing.  Plan:  Decrease to Ceftazidime 2g IV q24h  Decrease to Vancomycin 750 mg IV q36h  Check vancomycin levels when at steady state.  Goal AUC 400-500.  Follow up renal fxn, culture results, and clinical course.   Height: 5\' 4"  (162.6 cm) Weight: 123 lb (55.8 kg) IBW/kg (Calculated) : 54.7  Temp (24hrs), Avg:98 F (36.7 C), Min:97.3 F (36.3 C), Max:98.4 F (36.9 C)  Recent Labs  Lab 05/31/18 1049 06/03/18 1304 06/04/18 0123 06/04/18 0602 06/04/18 1649 06/05/18 0558  WBC 23.1* 33.3* 58.8* 62.3*  --  39.2*  CREATININE  --  0.65  --  1.07*  --  1.53*  VANCORANDOM  --   --   --   --  8  --     Estimated Creatinine Clearance: 26.2 mL/min (A) (by C-G formula based on SCr of 1.53 mg/dL (H)).    No Known Allergies  Antimicrobials this admission: Outpatient Vanc and Ceftazidime 9/11 >> 10/18 Inpatient:  Ceftazidime 10/21 >>  Vancomycin 10/21 >>  Augmentin 10/25 >>   Drug level/ Dose adjustments this admission: 10/22 Vanc random level = 8 mcg/mL, obtained 24 hours after initial dose.  Continue prior dose of vancomycin 750mg  q24h 10/23 SCr increased, reduce frequency of vancomycin and ceftazidime.  Microbiology results: 10/22 MRSA PCR:  10/21 Tissue (right hip@ 1625):  gram stain no organisms; Cx few candida tropicalis  10/21 Tissue (right hip@ 1635):  gram stain GNR; Cx ngtd  Thank you for allowing pharmacy to be a part of this patient's care.  Gretta Arab PharmD, BCPS Pager [phone] 06/05/2018 11:01 AM

## 2018-06-05 NOTE — Progress Notes (Signed)
CRITICAL VALUE ALERT  Critical Value:  Hgb 6.9  Date & Time Notied:  06/05/18 1821  Provider Notified: Attending  Orders Received/Actions taken:

## 2018-06-05 NOTE — Evaluation (Signed)
Physical Therapy Evaluation Patient Details Name: Alejandra [name] MRN: [id number] DOB: Dec 22, 1940 Today's Date: 06/05/2018   History of Present Illness  Alejandra [name] 77 y.o. female with hx of Polycythemia vera, and previous right hip hemiarthroplasty in 2013 with multiple surgeries. Admitted 10/21 from SNF   for recurrent right THA infection. Radical irrigation and debridement of infected right total hip with removal of all hardware, placement of acetabular spacer and wound VAC. On  early AM 06/04/18 returned to surgery  emergently for evacuation of hematoma. H/O glaucoma  Clinical Impression  The patient requires total asisst of 2 for mobilizing to sitting at bed edge. Unable to mobilize to recliner. Demonstrates poor tolerance to sitting and right hip flexion. Pt admitted with above diagnosis. Pt currently with functional limitations due to the deficits listed below (see PT Problem List).  Pt will benefit from skilled PT to increase their independence and safety with mobility to allow discharge to the venue listed below.       Follow Up Recommendations SNF    Equipment Recommendations  None recommended by PT    Recommendations for Other Services       Precautions / Restrictions Precautions Precautions: Posterior Hip;Fall Precaution Comments: orders state" direct anterior hip- not precautions.  Previous admissions support posterior hip precautions.  Low vision Restrictions Weight Bearing Restrictions: Yes RLE Weight Bearing: Weight bearing as tolerated      Mobility  Bed Mobility Overal bed mobility: Needs Assistance Bed Mobility: Supine to Sit;Sit to Supine     Supine to sit: +2 for physical assistance;Total assist;+2 for safety/equipment Sit to supine: +2 for physical assistance;Total assist;+2 for safety/equipment   General bed mobility comments: patient unable to assist.  Transfers                 General transfer comment: unable  Ambulation/Gait                 Stairs            Wheelchair Mobility    Modified Rankin (Stroke Patients Only)       Balance Overall balance assessment: Needs assistance Sitting-balance support: Bilateral upper extremity supported;Feet supported Sitting balance-Leahy Scale: Zero Sitting balance - Comments: total assist for sitting. Postural control: Posterior lean;Left lateral lean                                   Pertinent Vitals/Pain Pain Assessment: Faces Faces Pain Scale: Hurts even more Pain Location: right hip when moving Pain Descriptors / Indicators: Discomfort;Grimacing;Guarding;Moaning Pain Intervention(s): Monitored during session;Repositioned    Home Living Family/patient expects to be discharged to:: Skilled nursing facility                      Prior Function Level of Independence: Needs assistance   Gait / Transfers Assistance Needed: non     Comments: brother reports theat patient was requiring 2 assist lift to [facility].     Hand Dominance        Extremity/Trunk Assessment   Upper Extremity Assessment Upper Extremity Assessment: Generalized weakness    Lower Extremity Assessment Lower Extremity Assessment: RLE deficits/detail RLE Deficits / Details: patient unable to move at the hip, did not tolerate hip flexion greater than 50 degrees sititng    Cervical / Trunk Assessment Cervical / Trunk Assessment: Kyphotic  Communication   Communication: HOH  Cognition Arousal/Alertness: Awake/alert  Behavior During Therapy: WFL for tasks assessed/performed Overall Cognitive Status: Difficult to assess                                 General Comments: oriented to [facility], states that she does not know what has been done. Did not know of surgery.      General Comments      Exercises     Assessment/Plan    PT Assessment Patient needs continued PT services  PT Problem List Decreased strength;Decreased range of  motion;Decreased activity tolerance;Decreased mobility;Decreased knowledge of precautions;Decreased balance;Pain;Cardiopulmonary status limiting activity;Decreased safety awareness;Decreased knowledge of use of DME       PT Treatment Interventions Functional mobility training;Therapeutic activities;Patient/family education    PT Goals (Current goals can be found in the Care Plan section)  Acute Rehab PT Goals Patient Stated Goal: agreed with sitting bed edge, mobility PT Goal Formulation: With patient/family Time For Goal Achievement: 06/19/18 Potential to Achieve Goals: Fair    Frequency Min 2X/week   Barriers to discharge        Co-evaluation               AM-PAC PT "6 Clicks" Daily Activity  Outcome Measure Difficulty turning over in bed (including adjusting bedclothes, sheets and blankets)?: Unable Difficulty moving from lying on back to sitting on the side of the bed? : Unable Difficulty sitting down on and standing up from a chair with arms (e.g., wheelchair, bedside commode, etc,.)?: Unable Help needed moving to and from a bed to chair (including a wheelchair)?: Total Help needed walking in hospital room?: Total Help needed climbing 3-5 steps with a railing? : Total 6 Click Score: 6    End of Session   Activity Tolerance: Patient limited by pain Patient left: in bed;with call bell/phone within reach;with bed alarm set;with family/visitor present;with nursing/sitter in room Nurse Communication: Mobility status;Need for lift equipment PT Visit Diagnosis: Unsteadiness on feet (R26.81);Pain Pain - Right/Left: Right Pain - part of body: Hip    Time: 4627-0350 PT Time Calculation (min) (ACUTE ONLY): 20 min   Charges:   PT Evaluation $PT Eval Moderate Complexity: [place] Pager [phone] Office [phone]   Claretha Cooper 06/05/2018, 4:43 PM

## 2018-06-05 NOTE — Progress Notes (Signed)
Patient had a burst of SVT @ 1510. The patient's SVT did not sustain. The patient's heart rate went quickly down to the low 100s. This has been her heart rate throughout the day. Patient was not symptomatic. She was talking with her brother who was in the room at the time of the SVT. Advanced Practice Provider made aware of the SVT. Will continue to monitor patient.

## 2018-06-05 NOTE — Progress Notes (Signed)
IP PROGRESS NOTE  Subjective:   Alejandra [name] is lethargic this morning.  The ICU RN reports she has been intermittently confused. Objective: Vital signs in last 24 hours: Blood pressure (!) 122/56, pulse 99, temperature 98.4 F (36.9 C), temperature source Oral, resp. rate 15, height 5\' 4"  (1.626 m), weight 123 lb (55.8 kg), SpO2 98 %.  Intake/Output from previous day: 10/22 0701 - 10/23 0700 In: 4673.3 [I.V.:1595; Blood:630; IV Piggyback:2423.3] Out: 545 [Urine:300; Drains:245]  Physical Exam:  HEENT: No thrush Lungs: Clear anteriorly Cardiac: Regular rate and rhythm Extremities: No leg edema Musculoskeletal: Right thigh gauze dressing is dry.  Old appearing blood in the drain tubes.     Lab Results: Recent Labs    06/04/18 0602  06/04/18 2225 06/05/18 0558  WBC 62.3*  --   --  39.2*  HGB 5.6*   < > 8.1* 8.0*  HCT 19.2*   < > 25.4* 24.9*  PLT 717*  --   --  579*   < > = values in this interval not displayed.    BMET Recent Labs    06/04/18 0602 06/05/18 0558  NA 141 140  K 3.5 3.9  CL 116* 112*  CO2 18* 19*  GLUCOSE 220* 123*  BUN 15 21  CREATININE 1.07* 1.53*  CALCIUM 7.2* 7.1*     Medications: I have reviewed the patient's current medications.  Assessment/Plan:  1. Polycythemia vera on hydroxyurea. Last phlebotomy 09/08/2015  Hydroxyurea dose increased to 1500 mg daily 06/26/2016  Hydroxyurea changed to 1500 mg daily on Monday through Friday, 1000 mg on Saturday and Sunday beginning 09/11/2016  Hydroxyurea changed to 1500 mg Monday Wednesday and Friday and 1000 mg other days beginning 10/10/2016  Hydroxyurea changed to 1000 mg daily beginning 10/31/2016  Hydroxyurea placed on hold beginning 11/27/2016-she misunderstood our instructions and continued 1000 mg daily.  Hydroxyurea dose decreased to 500 mg daily beginning 01/01/2017  Hydroxyurea dose increased 06/05/2017  Hydroxyurea dose increased to 1000 mg daily except for 500 mg on Monday,  Wednesday, and Friday, 11/01/2017  Hydroxyurea discontinued 04/25/2018  Anagrelide started 04/25/2018 2. Hypertension, followed by Dr. Montez Morita.  3. History of hyperpigmentation at the hands. Question related to hydroxyurea. 4. Hip fracture February 2013. She underwent a bipolar hip arthroplasty on 04/23/2012. 5. Thrombocytosis. Most likely secondary to polycythemia vera and iron deficiency.  Improved 6. Hospitalization with left shoulder cellulitis February 2016. 7. Pelvic fracture after a fall 05/31/2015 8. Weight loss-etiology unclear; weight stable 12/16/2015 and06/15/2017 9. Right anterior buccal ulcer 03/09/2016 10. Revision of right hip arthroplasty 3/78/5885, complicated by intraoperative and postoperative bleeding requiring multiple red cell transfusions 11. Swelling at the right hip surgical site week of 04/08/2018- 220 cc bloody fluid aspirated from the right hip surgical site 04/11/2018  Right thigh wound dehiscence prompting readmission 04/23/2018  CT 04/23/2018- gas/fluid collection at the lateral right hip with area of focal cortical destruction concerning for infection  Irrigation/debridement and replacement of right hip hardware 04/24/2018  Persistent sinus tract prompting removal of the right hip hardware and wound debridement on 06/03/2018  Postoperative bleeding prompting wound exploration, cauterization of "bleeders ", placement of Hemovacs, and a platelet transfusion in the early a.m. on 06/04/2018 12. Coagulopathy 13. Anemia secondary to surgical blood loss 14.Fever/cough, chest x-ray 05/01/2018 suggestive of pneumonia  [name] is recovering from the right thigh surgery.  The hemoglobin is stable this morning.  There is a small amount of blood in the Hemovacs.  There is no indication for a  platelet transfusion at present. Recommendations: 1.  Monitor hemoglobin and transfuse packed red blood cells for a significant drop 2.   DDAVP and platelet transfusion for  persistent bleeding 3.   Vitamin K 4.   Continue anagrelide 5.   Check PT/INR 06/06/2018 6.   Check bmp 06/06/2018  I will check on her in the a.m. 06/06/2018   LOS: 2 days   Betsy Coder, MD   06/05/2018, 6:58 AM

## 2018-06-05 NOTE — Progress Notes (Addendum)
[name]  MRN: [id number]  DOB/AGE:  [birth year] / 77 y.o.  1 Day Post-Op Procedure(s) (LRB): WOUND EXPLORATION evacuation hematoma (Right)    PROGRESS NOTE Subjective: Patient is alert, oriented, no Nausea, no Vomiting, yes passing gas, . Taking PO sips. Denies SOB, Chest or Calf Pain. Using Incentive Spirometer, PAS in place. Patient reports pain as  3/10  .    Objective: Vital signs in last 24 hours: Vitals:   06/05/18 0411 06/05/18 0500 06/05/18 0600 06/05/18 0700  BP:  (!) 120/55 (!) 122/56 130/64  Pulse:  98 99 95  Resp:  17 15 16   Temp: 98.4 F (36.9 C)     TempSrc: Oral     SpO2:  98% 98% 99%  Weight:      Height:          Intake/Output from previous day: I/O last 3 completed shifts: In: 7429.3 [P.O.:120; I.V.:3159.3; Blood:1491.7; Other:235; IV Piggyback:2423.3] Out: 1912 [Urine:367; Drains:1245; Blood:300]   Intake/Output this shift: No intake/output data recorded.   LABORATORY DATA: Recent Labs    06/03/18 1304  06/04/18 0123 06/04/18 0602  06/04/18 2225 06/05/18 0558  WBC 33.3*  --  58.8* 62.3*  --   --  39.2*  HGB 10.0*   < > 7.9* 5.6*   < > 8.1* 8.0*  HCT 35.2*   < > 26.1* 19.2*   < > 25.4* 24.9*  PLT 478*  --  667* 717*  --   --  579*  NA 144  --   --  141  --   --  140  K 3.4*  --   --  3.5  --   --  3.9  CL 111  --   --  116*  --   --  112*  CO2 25  --   --  18*  --   --  19*  BUN 10  --   --  15  --   --  21  CREATININE 0.65  --   --  1.07*  --   --  1.53*  GLUCOSE 80  --   --  220*  --   --  123*  INR 1.32  --  1.64  --   --   --   --   CALCIUM 8.7*  --   --  7.2*  --   --  7.1*   < > = values in this interval not displayed.    Examination: Neurologically intact Neurovascular intact Sensation intact distally Intact pulses distally Dorsiflexion/Plantar flexion intact Incision: moderate drainage and drains in place and it appears that yesterday she had 245 ml } XR AP&Lat of hip shows well placed\fixed THA  Assessment:    1 Day Post-Op Procedure(s) (LRB): WOUND EXPLORATION evacuation hematoma (Right) ADDITIONAL DIAGNOSIS:  Expected Acute Blood Loss Anemia, polyclonal polycythemia vera with platelet dysfunction and chronically elevated white cell counts.  Acute Renal Failure  Plan: PT/OT resection arthroplasty, ok to work on transfers  DVT Prophylaxis: SCDx72 hrs,    DISCHARGE PLAN: Skilled Nursing Facility/Rehab  DISCHARGE NEEDS: HHPT, Walker, 3-in-1 comode seat and IV Antibiotics

## 2018-06-05 NOTE — Progress Notes (Addendum)
Initial Nutrition Assessment  DOCUMENTATION CODES:   Not applicable  INTERVENTION:  - Diet advancement when medically feasible. - Continue Ensure Enlive TID, each supplement provides 350 kcal and 20 grams of protein. - Encourage PO intakes.    NUTRITION DIAGNOSIS:   Inadequate oral intake related to inability to eat as evidenced by NPO status.  GOAL:   Patient will meet greater than or equal to 90% of their needs  MONITOR:   Diet advancement, PO intake, Supplement acceptance, Weight trends, Labs, Skin  REASON FOR ASSESSMENT:   Other (Comment)(Pressure injury report)  ASSESSMENT:   77 year-old with PMH of arthritis, avascular necrosis and fracture of R femur head, cellulitis of shoulder, glaucoma, HTN, polycythemia vera, rhabdomyolysis, thrombocytosis. She presented on 10/21 with infected R total hip arthroplasty and on that date underwent revision, hardware removal, and vac placement. She returned to the OR on 10/22 for evacuation of hematoma at the site.   BMI indicates normal weight. Patient was on Regular diet yesterday with no documented intakes; changed to NPO at midnight. Patient sleeping on her side at the time of RD visit with no family/visitors present. Flow sheet indicates that patient is a/o to self only. She briefly opened eyes during NFPE but quickly fell back to sleep and was unable to answer any questions.   Per chart review, current weight is 123 lb and per weight hx chart, it appears that weight has been mainly stable since 03/2017.   Medications reviewed; 10 mg vitamin K BID, 500 mg ascorbic acid/day. Labs reviewed; Cl: 112 mmol/L, creatinine: 1.53 mg/dL, Ca: 7.1 mg/dL, GFR: 36 mL/min. IVF; D5-1/2 NS-20 mEq IV KCl @ 125 mL/hr (510 kcal).     NUTRITION - FOCUSED PHYSICAL EXAM:  Completed; no muscle or fat wasting noted, mild edema to BLE.   Diet Order:   Diet Order            Diet NPO time specified Except for: Sips with Meds  Diet effective midnight               EDUCATION NEEDS:   No education needs have been identified at this time  Skin:  Skin Assessment: Skin Integrity Issues: Skin Integrity Issues:: Incisions, Wound VAC, Stage II Stage II: buttocks Wound Vac: R hip Incisions: R hip (10/21)  Last BM:  10/20  Height:   Ht Readings from Last 1 Encounters:  06/03/18 5\' 4"  (1.626 m)    Weight:   Wt Readings from Last 1 Encounters:  06/03/18 55.8 kg    Ideal Body Weight:  54.54 kg  BMI:  Body mass index is 21.11 kg/m.  Estimated Nutritional Needs:   Kcal:  1675-1840 (30-33 kcal/kg)  Protein:  72-84 grams (1.3-1.5 grams/kg)  Fluid:  >/= 1.7 L/day     Jarome Matin, MS, RD, LDN, [facility] Inpatient Clinical Dietitian Pager # [phone] After hours/weekend pager # [phone]

## 2018-06-05 NOTE — Consult Note (Addendum)
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 Continue vanco-ceftaz for now 2. Await more info from GNR Cx.  3. Add fluconazole. ( I called lab) 4. Ask for sensitivities to be done on C tropicalis 5. 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 sodium chloride   Intravenous Once  . sodium chloride   Intravenous Once  . amLODipine  10 mg Oral Daily  . [START ON 06/07/2018] amoxicillin-clavulanate  1 tablet Oral BID  . anagrelide  1 mg Oral BID  . chlorhexidine  60 mL Topical Once  . docusate sodium  100 mg Oral BID  . feeding supplement (ENSURE ENLIVE)  237 mL Oral TID BM  . gabapentin  300 mg Oral BID  . latanoprost  1 drop Both Eyes QHS  . pantoprazole  40 mg Oral Daily  . phytonadione  10 mg Oral Q12H  . povidone-iodine  2 application Topical Once  . rosuvastatin  5 mg Oral QPM  . sodium chloride flush  10 mL Intravenous Q12H  . vitamin C  500 mg Oral Daily    HPI: Alejandra [name] 77 y.o. female with hx of P vera, and previous right hip hemiarthroplasty in 2013 and then loosening of the stem. She underwent revision right total hip arthroplasty on 02/04/2018. Gram stain showed no organisms and cultures were negative. She was not treated with any postoperative antibiotics. She developed oozing from her incision then swelling of her right hip.  She was seen by Dr. Hal Morales on8/29/2019 and had aspirate of 220 cc of bloody fluid. The white blood cell count was 13,000 with 90% segmented neutrophils. Gram stain showed no organisms, cultures were negative.  She fell out of bed and  landed on her right hip, admitted on 04/12/2017. There were no acute findings on hip x-ray. The hip was aspirated again and 50 cc of bloody fluid were obtained (no Cx). She was afebrile. Urinalysis showed pyuria and urine culture grew E. coli. She was treated with ceftriaxone and then transition to cephalexin. She was discharged on cephalexin on 9/4/2019to SNF (anbx stop date unclear).   She continued to have wound drainage and developed proximal wound dehiscence leading to readmission on 04/23/2018. CT scan revealed the following: Prior total hip arthroplasty revision with large rim enhancing gas and fluid collection along the lateral aspect of the right hip, measuring up to 11.4 cm in craniocaudal dimension. The collection extends to and overlies the lateral and posterior aspect of the right intertrochanteric femur, with an underlying area of focal cortical destruction concerning for osteomyelitis and underlying hardware infection.  She underwent incision and drainage with exchange of the femoral head, stem liner on 9-11. Gram stain showed no white blood cells and no organisms. Cx: normal skin flora.  She was discharged on IV vancomycin and ceftazidime.    She had ID f/u on 10-15 and was continued on her IV anbx.   She returns on 10-21 and underwent I & D of her  R hip with removal of all hardware, placement of anbx spacer and wound VAC. Her post op course was complicated by bleeding. Her wound VAC was d/c and she returned to the OR.  A large hematoma was drained and she received blood and plt transfusion.  One gram stain showed GNR. The second sample grew C tropicalis.  She has been continued on her vanco/ceftaz.  C/o hunger  Review of Systems: Review of Systems  Constitutional: Positive for chills. Negative for fever.  Gastrointestinal: Negative for constipation and diarrhea.  Genitourinary: Negative for dysuria.  Musculoskeletal: Positive for joint pain.  Please see HPI. All other  systems reviewed and negative.   Past Medical History:  Diagnosis Date  . Arthritis   . Avascular necrosis of femur head, right (HCC) 04/23/2012  . Cellulitis of shoulder 09/28/2014  . Fracture 10/05/11   "fell and broke right hip"  . Fracture of femoral neck, right (HCC) 10/12/2011  . Glaucoma   . Glaucoma    bilateral  . Hypertension   . Polycythemia vera(238.4)   . Postoperative wound dehiscence   . Rhabdomyolysis 09/28/2014  . Thrombocytosis (HCC) 04/23/2012    Social History   Tobacco Use  . Smoking status: Former Smoker    Packs/day: 0.12    Types: Cigarettes    Last attempt to quit: 08/15/1991    Years since quitting: 26.8  . Smokeless tobacco: Never Used  Substance Use Topics  . Alcohol use: No    Comment: 10/12/11 "did drink in my younger; don't drink at all now"  . Drug use: No    Family History  Problem Relation Age of Onset  . Cancer Mother   . Hypertension Father   . Asthma Sister      Medications:  Scheduled: . sodium chloride   Intravenous Once  . sodium chloride   Intravenous Once  . amLODipine  10 mg Oral Daily  . [START ON 06/07/2018] amoxicillin-clavulanate  1 tablet Oral BID  . anagrelide  1 mg Oral BID  . chlorhexidine  60 mL Topical Once  . docusate sodium  100 mg Oral BID  . feeding supplement (ENSURE ENLIVE)  237 mL Oral TID BM  . gabapentin  300 mg Oral BID  . latanoprost  1 drop Both Eyes QHS  . pantoprazole  40 mg Oral Daily  . povidone-iodine  2 application Topical Once  . rosuvastatin  5 mg Oral QPM  . sodium chloride flush  10 mL Intravenous Q12H  . vitamin C  500 mg Oral Daily    Abtx:  Anti-infectives (From admission, onward)   Start     Dose/Rate Route Frequency Ordered Stop   06/07/18 0800  amoxicillin-clavulanate (AUGMENTIN) 500-125 MG per tablet 500 mg     1 tablet Oral 2 times daily 06/03/18 2224     06/06/18 1000  vancomycin (VANCOCIN) IVPB 750 mg/150 ml premix     750 mg 150 mL/hr over 60 Minutes Intravenous Every 36  hours 06/05/18 1114     06/06/18 1000  cefTAZidime (FORTAZ) 2 g in sodium chloride 0.9 % 100 mL IVPB     2 g 200 mL/hr over 30 Minutes Intravenous Every 24 hours 06/05/18 1149     06/04/18 2000  vancomycin (VANCOCIN) IVPB 750 mg/150 ml premix  Status:  Discontinued     750 mg 150 mL/hr over 60 Minutes Intravenous Every 24 hours 06/04/18 1906 06/05/18 1114   06/04/18 0713  vancomycin variable dose per unstable renal function (pharmacist dosing)    Status:  Discontinued      Does not apply See admin instructions 06/04/18 0714 06/04/18 1916   06/03/18 2330  ceftAZIDime (FORTAZ) IVPB 2 g  Status:  Discontinued     2 g 100 mL/hr over 30 Minutes Intravenous 2 times daily 06/03/18 2224 06/03/18 2321   06/03/18 2330  cefTAZidime (FORTAZ) 2 g in sodium chloride 0.9 % 100 mL IVPB  Status:  Discontinued     2 g 200 mL/hr over 30 Minutes Intravenous Every 12 hours 06/03/18 2322 06/05/18 1149   06/03/18 2230  vancomycin IVPB  Status:  Discontinued     750 mg Intravenous Every 24 hours 06/03/18 2224 06/03/18 2323   06/03/18 1710  tobramycin (NEBCIN) powder  Status:  Discontinued       As needed 06/03/18 1812 06/03/18 1812   06/03/18 1710  vancomycin (VANCOCIN) powder  Status:  Discontinued       As needed 06/03/18 1812 06/03/18 1812   06/03/18 1530  cefTAZidime (FORTAZ) 2 g in sodium chloride 0.9 % 100 mL IVPB     2 g 200 mL/hr over 30 Minutes Intravenous  Once 06/03/18 1521 06/03/18 1629   06/03/18 1530  vancomycin (VANCOCIN) IVPB 750 mg/150 ml premix     750 mg 150 mL/hr over 60 Minutes Intravenous  Once 06/03/18 1521 06/03/18 1650        OBJECTIVE: Blood pressure (!) 99/56, pulse (!) 105, temperature 98.7 F (37.1 C), temperature source Oral, resp. rate 20, height 5' 4" (1.626 m), weight 55.8 kg, SpO2 96 %.  Physical Exam  Constitutional: She appears well-developed and well-nourished. No distress.  HENT:  Mouth/Throat: No oropharyngeal exudate.  Eyes: EOM are normal.  Neck: Normal range  of motion. Neck supple.  Cardiovascular: Normal rate and regular rhythm.  Murmur heard. Pulmonary/Chest: Effort normal and breath sounds normal.  Abdominal: Soft. Bowel sounds are normal. She exhibits no distension. There is no tenderness.  Musculoskeletal: She exhibits no edema.       Legs: Lymphadenopathy:    She has no cervical adenopathy.    Lab Results Results for orders placed or performed during the hospital encounter of 06/03/18 (from the past 48 hour(s))  Aerobic/Anaerobic Culture (surgical/deep wound)     Status: None (Preliminary result)   Collection Time: 06/03/18  4:25 PM  Result Value Ref Range   Specimen Description      WOUND RIGHT HIP Performed at [facility], [address] [place], [place] [address]    Special Requests      NONE Performed at [facility], [address] [place], [place] [address]    Gram Stain      RARE WBC PRESENT, PREDOMINANTLY PMN NO ORGANISMS SEEN Performed at [facility], [address] [place], [place] [address]    Culture      FEW CANDIDA TROPICALIS NO ANAEROBES ISOLATED; CULTURE IN PROGRESS FOR 5 DAYS    Report Status PENDING   Aerobic/Anaerobic Culture (surgical/deep wound)     Status: None (Preliminary result)   Collection Time: 06/03/18  4:35 PM  Result Value Ref Range   Specimen Description      TISSUE RIGHT HIP Performed at [facility], [address] [place], [place] [address]    Special Requests      NONE Performed at [facility], [address] [place],  [address]    Gram Stain      RARE WBC PRESENT, PREDOMINANTLY PMN RARE   GRAM NEGATIVE RODS    Culture      NO GROWTH 2 DAYS NO ANAEROBES ISOLATED; CULTURE IN PROGRESS FOR 5 DAYS Performed at [facility], [place] [address] [place], [place] [address]    Report Status PENDING   Hemoglobin and hematocrit, blood     Status: Abnormal   Collection Time: 06/03/18  7:28  PM  Result Value Ref Range   Hemoglobin 4.7 (LL) 12.0 - 15.0 g/dL    Comment: REPEATED TO VERIFY THIS RESULT HAS BEEN CALLED TO S STUART RN BY AMY NAVARRO ON 10 21 2019 AT 1950, AND HAS BEEN READ BACK.     HCT 16.3 (L) 36.0 - 46.0 %    Comment: Performed at [facility], [place] [address], [place], [place] [address]  Hemoglobin and hematocrit, blood     Status: Abnormal   Collection Time: 06/03/18  8:23 PM  Result Value Ref Range   Hemoglobin 7.6 (L) 12.0 - 15.0 g/dL    Comment: POST TRANSFUSION SPECIMEN   HCT 27.5 (L) 36.0 - 46.0 %    Comment: Performed at [facility], [place] [address] [place], [place] [address]  Prepare RBC     Status: None   Collection Time: 06/03/18  9:00 PM  Result Value Ref Range   Order Confirmation      ORDER PROCESSED BY BLOOD BANK Performed at [facility] [address] [place], [place] [address]   Prepare Pheresed Platelets     Status: None   Collection Time: 06/04/18  1:21 AM  Result Value Ref Range   Unit Number [id number]    Blood Component Type PLTPHER LR2    Unit division 00    Status of Unit ISSUED,FINAL    Transfusion Status      OK TO TRANSFUSE Performed at [facility] [address], [place], [place] [address]   CBC with Differential/Platelet     Status: Abnormal   Collection Time: 06/04/18  1:23 AM  Result Value Ref Range   WBC 58.8 (HH) 4.0 - 10.5 K/uL    Comment: WHITE COUNT CONFIRMED ON SMEAR THIS RESULT HAS BEEN CALLED TO HOPPER,MINDY RN BY KELLY TIBBITTS ON 10 22 2019 AT 0258, AND HAS BEEN READ BACK. RBV HOPPER,M RN AT [id number] BY TIBBITTS,K    RBC 3.13 (L) 3.87 - 5.11 MIL/uL   Hemoglobin 7.9 (L) 12.0 - 15.0 g/dL   HCT 26.1 (L) 36.0 - 46.0 %   MCV 83.4 80.0 - 100.0 fL   MCH 25.2 (L) 26.0 - 34.0 pg   MCHC 30.3 30.0 - 36.0 g/dL   RDW 24.1 (H) 11.5 - 15.5 %   Platelets 667 (H) 150 - 400 K/uL   nRBC 0.1 0.0 - 0.2 %   Neutrophils Relative % 84 %    Neutro Abs 49.8 (H) 1.7 - 7.7 K/uL   Lymphocytes Relative 3 %   Lymphs Abs 1.7 0.7 - 4.0 K/uL   Monocytes Relative 3 %   Monocytes Absolute 1.6 (H) 0.1 - 1.0 K/uL   Eosinophils Relative 1 %   Eosinophils Absolute 0.4 0.0 - 0.5 K/uL   Basophils Relative 1 %   Basophils Absolute 0.5 (H) 0.0 - 0.1 K/uL   WBC Morphology MILD LEFT SHIFT (1-5% METAS, OCC MYELO, OCC BANDS)    Immature Granulocytes 8 %   Abs Immature Granulocytes 4.75 (H) 0.00 - 0.07 K/uL   Polychromasia PRESENT     Comment: Performed  at [facility], [place] [address] [place], [place] [address]  Protime-INR     Status: Abnormal   Collection Time: 06/04/18  1:23 AM  Result Value Ref Range   Prothrombin Time 19.3 (H) 11.4 - 15.2 seconds   INR 1.64     Comment: Performed at [facility], [place] [address], [place], [place] [address]  APTT     Status: None   Collection Time: 06/04/18  1:23 AM  Result Value Ref Range   aPTT 31 24 - 36 seconds    Comment: Performed at [facility], [place] [address], [place], [place] [address]  CBC     Status: Abnormal   Collection Time: 06/04/18  6:02 AM  Result Value Ref Range   WBC 62.3 (HH) 4.0 - 10.5 K/uL    Comment: WHITE COUNT CONFIRMED ON SMEAR CRITICAL VALUE NOTED.  VALUE IS CONSISTENT WITH PREVIOUSLY REPORTED AND CALLED VALUE. THIS RESULT HAS BEEN CALLED TO JOHNSON,H BY POTEAT,SHANNON ON 10 22 2019 AT 0738, AND HAS BEEN READ BACK. CRITICAL RESULT VERIFIED    RBC 2.34 (L) 3.87 - 5.11 MIL/uL   Hemoglobin 5.6 (LL) 12.0 - 15.0 g/dL    Comment: REPEATED TO VERIFY THIS RESULT HAS BEEN CALLED TO JOHNSON,H BY POTEAT,SHANNON ON 10 22 2019 AT 0738, AND HAS BEEN READ BACK. CRITICAL RESULT VERIFIED    HCT 19.2 (L) 36.0 - 46.0 %   MCV 82.1 80.0 - 100.0 fL   MCH 23.9 (L) 26.0 - 34.0 pg   MCHC 29.2 (L) 30.0 - 36.0 g/dL   RDW 26.7 (H) 11.5 - 15.5 %   Platelets 717 (H) 150 - 400 K/uL   nRBC 0.1 0.0 - 0.2 %    Comment: Performed at [facility], [place] [address], [place], [place] [address]  Basic metabolic panel     Status: Abnormal   Collection Time: 06/04/18  6:02 AM  Result Value Ref Range   Sodium 141 135 - 145 mmol/L   Potassium 3.5 3.5 - 5.1 mmol/L   Chloride 116 (H) 98 - 111 mmol/L   CO2 18 (L) 22 - 32 mmol/L   Glucose, Bld 220 (H) 70 - 99 mg/dL   BUN 15 8 - 23 mg/dL   Creatinine, Ser 1.07 (H) 0.44 - 1.00 mg/dL   Calcium 7.2 (L) 8.9 - 10.3 mg/dL   GFR calc non Af Amer 48 (L) >60 mL/min   GFR calc Af Amer 56 (L) >60 mL/min    Comment: (NOTE) The eGFR has been calculated using the CKD EPI equation. This calculation has not been validated in all clinical situations. eGFR's persistently <60 mL/min signify possible Chronic Kidney Disease.    Anion gap 7 5 - 15    Comment: Performed at [facility], [place] [address], [place], [place] [address]  Prepare RBC     Status: None   Collection Time: 06/04/18  7:52 AM  Result Value Ref Range   Order Confirmation      ORDER PROCESSED BY BLOOD BANK Performed at [facility], [place] [address], [place], [place] [address]   Vancomycin, random     Status: None   Collection Time: 06/04/18  4:49 PM  Result Value Ref Range   Vancomycin Rm 8     Comment:        Random Vancomycin therapeutic range is dependent on dosage and time of specimen collection. A peak range is 20.0-40.0 ug/mL A trough  range is 5.0-15.0 ug/mL        Performed at [place] [address], [place], [place] [address]   Hemoglobin and hematocrit, blood     Status: Abnormal   Collection Time: 06/04/18  4:49 PM  Result Value Ref Range   Hemoglobin 9.3 (L) 12.0 - 15.0 g/dL    Comment: REPEATED TO VERIFY POST TRANSFUSION SPECIMEN DELTA CHECK NOTED.    HCT 29.2 (L) 36.0 - 46.0 %    Comment: Performed at [facility], [place] [address] [place], [place] [address]  Hemoglobin and hematocrit, blood     Status: Abnormal    Collection Time: 06/04/18 10:25 PM  Result Value Ref Range   Hemoglobin 8.1 (L) 12.0 - 15.0 g/dL   HCT 25.4 (L) 36.0 - 46.0 %    Comment: Performed at [facility], [place] [address] [place], [place] [address]  CBC     Status: Abnormal   Collection Time: 06/05/18  5:58 AM  Result Value Ref Range   WBC 39.2 (H) 4.0 - 10.5 K/uL    Comment: WHITE COUNT CONFIRMED ON SMEAR   RBC 3.09 (L) 3.87 - 5.11 MIL/uL   Hemoglobin 8.0 (L) 12.0 - 15.0 g/dL   HCT 24.9 (L) 36.0 - 46.0 %   MCV 80.6 80.0 - 100.0 fL   MCH 25.9 (L) 26.0 - 34.0 pg   MCHC 32.1 30.0 - 36.0 g/dL   RDW 21.1 (H) 11.5 - 15.5 %   Platelets 579 (H) 150 - 400 K/uL   nRBC 0.3 (H) 0.0 - 0.2 %    Comment: Performed at [facility], [place] [address], [place], [place] [address]  Basic metabolic panel     Status: Abnormal   Collection Time: 06/05/18  5:58 AM  Result Value Ref Range   Sodium 140 135 - 145 mmol/L   Potassium 3.9 3.5 - 5.1 mmol/L   Chloride 112 (H) 98 - 111 mmol/L   CO2 19 (L) 22 - 32 mmol/L   Glucose, Bld 123 (H) 70 - 99 mg/dL   BUN 21 8 - 23 mg/dL   Creatinine, Ser 1.53 (H) 0.44 - 1.00 mg/dL   Calcium 7.1 (L) 8.9 - 10.3 mg/dL   GFR calc non Af Amer 31 (L) >60 mL/min   GFR calc Af Amer 36 (L) >60 mL/min    Comment: (NOTE) The eGFR has been calculated using the CKD EPI equation. This calculation has not been validated in all clinical situations. eGFR's persistently <60 mL/min signify possible Chronic Kidney Disease.    Anion gap 9 5 - 15    Comment: Performed at [facility], [place] [address] [place], [place] [address]  MRSA PCR Screening     Status: None   Collection Time: 06/05/18 12:13 PM  Result Value Ref Range   MRSA by PCR NEGATIVE NEGATIVE    Comment:        The GeneXpert MRSA Assay (FDA approved for NASAL specimens only), is one component of a comprehensive MRSA colonization surveillance program. It is not intended to diagnose MRSA infection nor to  guide or monitor treatment for MRSA infections. Performed at [facility], [place] [address], [place], [place] [address]       Component Value Date/Time   SDES  06/03/2018 1635    TISSUE RIGHT HIP Performed at [facility], [place] [address], [place], [place] [address]    SPECREQUEST  06/03/2018 1635  NONE Performed at [facility], [place] [address], [place], [place] [address]    CULT  06/03/2018 1635    NO GROWTH 2 DAYS NO ANAEROBES ISOLATED; CULTURE IN PROGRESS FOR 5 DAYS Performed at [facility], [place] [address] [place], [place] [address]    REPTSTATUS PENDING 06/03/2018 1635   No results found. Recent Results (from the past 240 hour(s))  Aerobic/Anaerobic Culture (surgical/deep wound)     Status: None (Preliminary result)   Collection Time: 06/03/18  4:25 PM  Result Value Ref Range Status   Specimen Description   Final    WOUND RIGHT HIP Performed at [place] [address] [place], [place] [address]    Special Requests   Final    NONE Performed at [facility], [place] [address] [place], [place] [address]    Gram Stain   Final    RARE WBC PRESENT, PREDOMINANTLY PMN NO ORGANISMS SEEN Performed at [facility], [place] [address] [place], [place] [address]    Culture   Final    FEW CANDIDA TROPICALIS NO ANAEROBES ISOLATED; CULTURE IN PROGRESS FOR 5 DAYS    Report Status PENDING  Incomplete  Aerobic/Anaerobic Culture (surgical/deep wound)     Status: None (Preliminary result)   Collection Time: 06/03/18  4:35 PM  Result Value Ref Range Status   Specimen Description   Final    TISSUE RIGHT HIP Performed at [place] [address], [place], [place] [address]    Special Requests   Final    NONE Performed at [facility], [place] [address], [place], [place] [address]    Gram Stain   Final    RARE WBC PRESENT,  PREDOMINANTLY PMN RARE GRAM NEGATIVE RODS    Culture   Final    NO GROWTH 2 DAYS NO ANAEROBES ISOLATED; CULTURE IN PROGRESS FOR 5 DAYS Performed at [facility], [place] [address] [place], [place] [address]    Report Status PENDING  Incomplete  MRSA PCR Screening     Status: None   Collection Time: 06/05/18 12:13 PM  Result Value Ref Range Status   MRSA by PCR NEGATIVE NEGATIVE Final    Comment:        The GeneXpert MRSA Assay (FDA approved for NASAL specimens only), is one component of a comprehensive MRSA colonization surveillance program. It is not intended to diagnose MRSA infection nor to guide or monitor treatment for MRSA infections. Performed at [facility], [place] [address] [place], [place] [address]     Microbiology: Recent Results (from the past 240 hour(s))  Aerobic/Anaerobic Culture (surgical/deep wound)     Status: None (Preliminary result)   Collection Time: 06/03/18  4:25 PM  Result Value Ref Range Status   Specimen Description   Final    WOUND RIGHT HIP Performed at [facility], [place] [address] [place], [place] [address]    Special Requests   Final    NONE Performed at [facility], [place] [address] [place], [place] [address]    Gram Stain   Final    RARE WBC PRESENT, PREDOMINANTLY PMN NO ORGANISMS SEEN Performed at [facility], [place] [address], [place], [place] [address]    Culture   Final    FEW CANDIDA TROPICALIS NO ANAEROBES ISOLATED; CULTURE IN PROGRESS FOR 5 DAYS    Report Status PENDING  Incomplete  Aerobic/Anaerobic Culture (surgical/deep  wound)     Status: None (Preliminary result)   Collection Time: 06/03/18  4:35 PM  Result Value Ref Range Status   Specimen Description   Final    TISSUE RIGHT HIP Performed at [facility], [address] [place], [place] [address]    Special Requests   Final    NONE Performed at [facility],  [address] [place], [place] [address]    Gram Stain   Final    RARE WBC PRESENT, PREDOMINANTLY PMN RARE GRAM NEGATIVE RODS    Culture   Final    NO GROWTH 2 DAYS NO ANAEROBES ISOLATED; CULTURE IN PROGRESS FOR 5 DAYS Performed at [facility], [address] [place], [place] [address]    Report Status PENDING  Incomplete  MRSA PCR Screening     Status: None   Collection Time: 06/05/18 12:13 PM  Result Value Ref Range Status   MRSA by PCR NEGATIVE NEGATIVE Final    Comment:        The GeneXpert MRSA Assay (FDA approved for NASAL specimens only), is one component of a comprehensive MRSA colonization surveillance program. It is not intended to diagnose MRSA infection nor to guide or monitor treatment for MRSA infections. Performed at  [facility], [address] [place], [place] [address]     Radiographs and labs were personally reviewed by me.    , MD FACP [facility] [phone] 06/05/2018, 3:50 PM  

## 2018-06-06 DIAGNOSIS — T8451XD Infection and inflammatory reaction due to internal right hip prosthesis, subsequent encounter: Secondary | ICD-10-CM

## 2018-06-06 DIAGNOSIS — M00851 Arthritis due to other bacteria, right hip: Secondary | ICD-10-CM

## 2018-06-06 LAB — PROTIME-INR
INR: 1.32
Prothrombin Time: 16.2 seconds — ABNORMAL HIGH (ref 11.4–15.2)

## 2018-06-06 LAB — CBC
HCT: 34 % — ABNORMAL LOW (ref 36.0–46.0)
Hemoglobin: 10.6 g/dL — ABNORMAL LOW (ref 12.0–15.0)
MCH: 26.4 pg (ref 26.0–34.0)
MCHC: 31.2 g/dL (ref 30.0–36.0)
MCV: 84.8 fL (ref 80.0–100.0)
NRBC: 0.2 % (ref 0.0–0.2)
Platelets: 518 10*3/uL — ABNORMAL HIGH (ref 150–400)
RBC: 4.01 MIL/uL (ref 3.87–5.11)
RDW: 19.9 % — ABNORMAL HIGH (ref 11.5–15.5)
WBC: 33.8 10*3/uL — ABNORMAL HIGH (ref 4.0–10.5)

## 2018-06-06 LAB — BASIC METABOLIC PANEL
Anion gap: 5 (ref 5–15)
BUN: 18 mg/dL (ref 8–23)
CALCIUM: 7.5 mg/dL — AB (ref 8.9–10.3)
CO2: 20 mmol/L — AB (ref 22–32)
CREATININE: 1.27 mg/dL — AB (ref 0.44–1.00)
Chloride: 117 mmol/L — ABNORMAL HIGH (ref 98–111)
GFR calc Af Amer: 46 mL/min — ABNORMAL LOW (ref >60)
GFR calc non Af Amer: 39 mL/min — ABNORMAL LOW (ref >60)
GLUCOSE: 108 mg/dL — AB (ref 70–99)
Potassium: 4.2 mmol/L (ref 3.5–5.1)
Sodium: 142 mmol/L (ref 135–145)

## 2018-06-06 LAB — PREPARE RBC (CROSSMATCH)

## 2018-06-06 MED ORDER — FLUCONAZOLE IN SODIUM CHLORIDE 200-0.9 MG/100ML-% IV SOLN
200.0000 mg | INTRAVENOUS | Status: DC
  Administered 2018-06-06: 200 mg via INTRAVENOUS
  Filled 2018-06-06: qty 100

## 2018-06-06 MED ORDER — SODIUM CHLORIDE 0.9 % IV SOLN
INTRAVENOUS | Status: DC | PRN
  Administered 2018-06-06 – 2018-06-13 (×3): 250 mL via INTRAVENOUS
  Administered 2018-06-16: 1000 mL via INTRAVENOUS

## 2018-06-06 MED ORDER — ORAL CARE MOUTH RINSE
15.0000 mL | Freq: Two times a day (BID) | OROMUCOSAL | Status: DC
  Administered 2018-06-06 – 2018-06-19 (×18): 15 mL via OROMUCOSAL

## 2018-06-06 NOTE — Progress Notes (Signed)
[name]  MRN: [id number]  DOB/AGE:  78-23-1942 / 78 y.o.  2 Days Post-Op Procedure(s) (LRB): WOUND EXPLORATION evacuation hematoma (Right)    PROGRESS NOTE Subjective: Patient is alert, oriented, no Nausea, no Vomiting, yes passing gas, . Taking PO sips. Denies SOB, Chest or Calf Pain. Using Incentive Spirometer, PAS in place. Ambulate bed to chair Patient reports pain as  3/10  .    Objective: Vital signs in last 24 hours: Vitals:   06/06/18 0400 06/06/18 0500 06/06/18 0750 06/06/18 0800  BP: (Abnormal) 143/57 (Abnormal) 145/64  (Abnormal) 142/75  Pulse: 100 99  88  Resp: 17 15  17   Temp:   98.7 F (37.1 C)   TempSrc:   Oral   SpO2: 97% 94%  97%  Weight:      Height:          Intake/Output from previous day: I/O last 3 completed shifts: In: 3843.1 [P.O.:520; I.V.:438.5; Blood:358; IV Piggyback:2526.6] Out: 2630 [Urine:2425; Drains:205]   Intake/Output this shift: No intake/output data recorded.   LABORATORY DATA: Recent Labs    06/03/18 1304  06/04/18 0123  06/05/18 0558 06/05/18 1712 06/06/18 0529  WBC 33.3*  --  58.8*   < > 39.2*  --  33.8*  HGB 10.0*   < > 7.9*   < > 8.0* 6.9* 10.6*  HCT 35.2*   < > 26.1*   < > 24.9* 22.4* 34.0*  PLT 478*  --  667*   < > 579*  --  518*  NA 144  --   --    < > 140  --  142  K 3.4*  --   --    < > 3.9  --  4.2  CL 111  --   --    < > 112*  --  117*  CO2 25  --   --    < > 19*  --  20*  BUN 10  --   --    < > 21  --  18  CREATININE 0.65  --   --    < > 1.53*  --  1.27*  GLUCOSE 80  --   --    < > 123*  --  108*  INR 1.32  --  1.64  --   --   --   --   CALCIUM 8.7*  --   --    < > 7.1*  --  7.5*   < > = values in this interval not displayed.    Examination: Neurologically intact ABD soft Neurovascular intact Sensation intact distally Intact pulses distally Dorsiflexion/Plantar flexion intact Incision: moderate drainage No cellulitis present Compartment soft} Dressing change today.  Moderate drainage  and 4 x 4 sponges.  No active bleeding or drainage.  The 2 deep drains and the 2 superficial drains are intact.  Cultures from surgery a few days ago come back positive for gram-negative rods no sensitivities as yet.  Assessment:   2 Days Post-Op Procedure(s) (LRB): WOUND EXPLORATION evacuation hematoma (Right) ADDITIONAL DIAGNOSIS:  Expected Acute Blood Loss Anemia,   2.Acute renal failure secondary to volume changes during the immediate perioperative period.  This is resolving.  3. Gram-negative deep wound infection,After complex revision right total up arthroplasty with postoperative bleeding and hematoma secondary to polycythemia vera.  4. Polycythemia vera, involving all 3 blood lines, with severe platelet dysfunction despite platelet counts That are routinely over 500,000  Plan: PT- Patient essentially is  a Girdlestone arthroplasty on the right side.  Her goals to get her bed to chair with a walker.  She is partial weightbearing on the right side  DVT Prophylaxis:Because of the patient's extensive bleeding after her surgeries, no DVT chemoprophylaxis.  PAS are indicated. Hematology is following her, and will assist with management.  For her deep wound infection: all hardware is been removed and there are deep drains in place.  They will stay in place until her drainage drops down well below 50 cc.  She will continue IV antibiotics per infectious diseases and we await the results of her cultures.  Hopefully, as her condition stabilizes, we will be able to get her back up to the orthopedic floor in 3 W.  Regarding her acute renal failure, this is probably from volume shifts related to her surgery.  Uses C multiple boluses and 4 units of packed red blood cells in her urine output is now stabilized.  DISCHARGE PLAN: Skilled Nursing Facility/Rehab  DISCHARGE NEEDS: HHPT, Walker and 3-in-1 comode seat2

## 2018-06-06 NOTE — Progress Notes (Signed)
INFECTIOUS DISEASE PROGRESS NOTE  ID: Alejandra [name] is a 77 y.o. female with  Active Problems:   Infection of prosthetic total hip joint ([place])   Pressure injury of skin  Subjective: No complaints.   Abtx:  Anti-infectives (From admission, onward)   Start     Dose/Rate Route Frequency Ordered Stop   06/07/18 0800  amoxicillin-clavulanate (AUGMENTIN) 500-125 MG per tablet 500 mg  Status:  Discontinued     1 tablet Oral 2 times daily 06/03/18 2224 06/05/18 1620   06/06/18 1600  fluconazole (DIFLUCAN) IVPB 200 mg     200 mg 100 mL/hr over 60 Minutes Intravenous Every 24 hours 06/06/18 1000     06/06/18 1000  vancomycin (VANCOCIN) IVPB 750 mg/150 ml premix     750 mg 150 mL/hr over 60 Minutes Intravenous Every 36 hours 06/05/18 1114     06/06/18 1000  cefTAZidime (FORTAZ) 2 g in sodium chloride 0.9 % 100 mL IVPB     2 g 200 mL/hr over 30 Minutes Intravenous Every 24 hours 06/05/18 1149     06/05/18 1700  fluconazole (DIFLUCAN) IVPB 400 mg  Status:  Discontinued     400 mg 100 mL/hr over 120 Minutes Intravenous Every 24 hours 06/05/18 1620 06/06/18 1000   06/04/18 2000  vancomycin (VANCOCIN) IVPB 750 mg/150 ml premix  Status:  Discontinued     750 mg 150 mL/hr over 60 Minutes Intravenous Every 24 hours 06/04/18 1906 06/05/18 1114   06/04/18 0713  vancomycin variable dose per unstable renal function (pharmacist dosing)  Status:  Discontinued      Does not apply See admin instructions 06/04/18 0714 06/04/18 1916   06/03/18 2330  ceftAZIDime (FORTAZ) IVPB 2 g  Status:  Discontinued     2 g 100 mL/hr over 30 Minutes Intravenous 2 times daily 06/03/18 2224 06/03/18 2321   06/03/18 2330  cefTAZidime (FORTAZ) 2 g in sodium chloride 0.9 % 100 mL IVPB  Status:  Discontinued     2 g 200 mL/hr over 30 Minutes Intravenous Every 12 hours 06/03/18 2322 06/05/18 1149   06/03/18 2230  vancomycin IVPB  Status:  Discontinued     750 mg Intravenous Every 24 hours 06/03/18 2224 06/03/18 2323   06/03/18 1710  tobramycin (NEBCIN) powder  Status:  Discontinued       As needed 06/03/18 1812 06/03/18 1812   06/03/18 1710  vancomycin (VANCOCIN) powder  Status:  Discontinued       As needed 06/03/18 1812 06/03/18 1812   06/03/18 1530  cefTAZidime (FORTAZ) 2 g in sodium chloride 0.9 % 100 mL IVPB     2 g 200 mL/hr over 30 Minutes Intravenous  Once 06/03/18 1521 06/03/18 1629   06/03/18 1530  vancomycin (VANCOCIN) IVPB 750 mg/150 ml premix     750 mg 150 mL/hr over 60 Minutes Intravenous  Once 06/03/18 1521 06/03/18 1650      Medications:  Scheduled: . sodium chloride   Intravenous Once  . sodium chloride   Intravenous Once  . amLODipine  10 mg Oral Daily  . anagrelide  1 mg Oral BID  . chlorhexidine  60 mL Topical Once  . Chlorhexidine Gluconate Cloth  6 each Topical Daily  . docusate sodium  100 mg Oral BID  . feeding supplement (ENSURE ENLIVE)  237 mL Oral TID BM  . gabapentin  300 mg Oral BID  . latanoprost  1 drop Both Eyes QHS  . mouth rinse  15 mL Mouth  Rinse BID  . pantoprazole  40 mg Oral Daily  . povidone-iodine  2 application Topical Once  . rosuvastatin  5 mg Oral QPM  . sodium chloride flush  10 mL Intravenous Q12H  . sodium chloride flush  10-40 mL Intracatheter Q12H  . vitamin C  500 mg Oral Daily    Objective: Vital signs in last 24 hours: Temp:  [97.4 F (36.3 C)-99.5 F (37.5 C)] 98.7 F (37.1 C) (10/24 0750) Pulse Rate:  [88-117] 88 (10/24 0800) Resp:  [15-30] 17 (10/24 0800) BP: (93-152)/(53-83) 142/75 (10/24 0800) SpO2:  [93 %-98 %] 97 % (10/24 0800)   General appearance: alert, cooperative and no distress Resp: clear to auscultation bilaterally Cardio: regular rate and rhythm GI: normal findings: bowel sounds normal and soft, non-tender  R hip dressed. Wounds in place.   Lab Results Recent Labs    06/05/18 0558 06/05/18 1712 06/06/18 0529  WBC 39.2*  --  33.8*  HGB 8.0* 6.9* 10.6*  HCT 24.9* 22.4* 34.0*  NA 140  --  142  K 3.9  --   4.2  CL 112*  --  117*  CO2 19*  --  20*  BUN 21  --  18  CREATININE 1.53*  --  1.27*   Liver Panel No results for input(s): PROT, ALBUMIN, AST, ALT, ALKPHOS, BILITOT, BILIDIR, IBILI in the last 72 hours. Sedimentation Rate No results for input(s): ESRSEDRATE in the last 72 hours. C-Reactive Protein No results for input(s): CRP in the last 72 hours.  Microbiology: Recent Results (from the past 240 hour(s))  Aerobic/Anaerobic Culture (surgical/deep wound)     Status: None (Preliminary result)   Collection Time: 06/03/18  4:25 PM  Result Value Ref Range Status   Specimen Description   Final    WOUND RIGHT HIP Performed at [facility], [place] [address] [place] [address], [place], [place] [address]    Special Requests   Final    NONE Performed at [facility], [place] [address], [place], [place] [address]    Gram Stain   Final    RARE WBC PRESENT, PREDOMINANTLY PMN NO ORGANISMS SEEN Performed at [facility], [place] [address], [place] [address]    Culture   Final    FEW CANDIDA TROPICALIS NO ANAEROBES ISOLATED; CULTURE IN PROGRESS FOR 5 DAYS    Report Status PENDING  Incomplete  Aerobic/Anaerobic Culture (surgical/deep wound)     Status: None (Preliminary result)   Collection Time: 06/03/18  4:35 PM  Result Value Ref Range Status   Specimen Description   Final    TISSUE RIGHT HIP Performed at [place] [address] [place], [place] [address]    Special Requests   Final    NONE Performed at [facility], [place] [address] [place], [place] [address]    Gram Stain   Final    RARE WBC PRESENT, PREDOMINANTLY PMN RARE GRAM NEGATIVE RODS    Culture   Final    NO GROWTH 2 DAYS NO ANAEROBES ISOLATED; CULTURE IN PROGRESS FOR 5 DAYS Performed at [facility], [place] [address] [place], [place] [address]    Report Status PENDING  Incomplete  MRSA PCR Screening     Status: None   Collection Time:  06/05/18 12:13 PM  Result Value Ref Range Status   MRSA by PCR NEGATIVE NEGATIVE Final    Comment:        The GeneXpert MRSA Assay (  FDA approved for NASAL specimens only), is one component of a comprehensive MRSA colonization surveillance program. It is not intended to diagnose MRSA infection nor to guide or monitor treatment for MRSA infections. Performed at [facility], [place] [address], [place], [place] [address]     Studies/Results: No results found.   Assessment/Plan: Wound Infection- C tropicalis, GNR Prosthetic Joint Resection P vera  Total days of antibiotics: 1 flucon, 34 ceftaz-vanco       Await further info from her Cx (Candida sensi, GNR ID) H/H improved after transfusion Continue her current anbx She has PIC    Bobby Rumpf MD, FACP Infectious Diseases (pager) [phone] [email or web address] 06/06/2018, 10:23 AM  LOS: 3 days

## 2018-06-06 NOTE — Progress Notes (Signed)
Physical Therapy Treatment Patient Details Name: Alejandra [name] MRN: [id number] DOB: 12-20-1940 Today's Date: 06/06/2018    History of Present Illness Alejandra [name] 77 y.o. female with hx of Polycythemia vera, and previous right hip hemiarthroplasty in 2013 with multiple surgeries. Admitted 10/21 from SNF   for recurrent right THA infection. Radical irrigation and debridement of infected right total hip with removal of all hardware, placement of acetabular spacer and wound VAC. On  early AM 06/04/18 returned to surgery  emergently for evacuation of hematoma. H/O glaucoma    PT Comments    The patient reports pain increases with any mobility . Assisted  To bed edge sitting. Leans to the left off of right hip. Did sit more forward today.. Continue PT efforts .   Follow Up Recommendations  SNF     Equipment Recommendations  None recommended by PT    Recommendations for Other Services       Precautions / Restrictions Precautions Precautions: Posterior Hip;Fall Precaution Comments: per Dr. Mayer Camel note 10/24- essentially a girdlestone on right with PWB_no % noted, previous posterior hip precaautions, 2 hemavacs on right hip Restrictions Weight Bearing Restrictions: Yes RLE Weight Bearing: Partial weight bearing    Mobility  Bed Mobility Overal bed mobility: Needs Assistance Bed Mobility: Supine to Sit;Sit to Supine;Rolling Rolling: +2 for physical assistance;Total assist;+2 for safety/equipment   Supine to sit: +2 for physical assistance;Total assist;+2 for safety/equipment Sit to supine: +2 for physical assistance;Total assist;+2 for safety/equipment   General bed mobility comments: patient did assist with pelvis boost on the  left side, assisted to roll with pillow between knees. Patient is tearful with pain complaints. use of bed pad and pillows under right leg to move in bed  to sitting position. patient does not shift to the right hip, noted significant edema at the right thigh.    Transfers                    Ambulation/Gait                 Stairs             Wheelchair Mobility    Modified Rankin (Stroke Patients Only)       Balance                                            Cognition Arousal/Alertness: Awake/alert Behavior During Therapy: WFL for tasks assessed/performed                                   General Comments: oriented to [facility], states that she does not know what has been done and why      Exercises      General Comments        Pertinent Vitals/Pain Faces Pain Scale: Hurts worst Pain Location: right hip when moving Pain Descriptors / Indicators: Crushing;Crying;Grimacing;Guarding Pain Intervention(s): Monitored during session;Premedicated before session;Repositioned    Home Living                      Prior Function            PT Goals (current goals can now be found in the care plan section) Progress towards PT goals: Progressing toward goals    Frequency  Min 2X/week      PT Plan Current plan remains appropriate    Co-evaluation              AM-PAC PT "6 Clicks" Daily Activity  Outcome Measure  Difficulty turning over in bed (including adjusting bedclothes, sheets and blankets)?: Unable Difficulty moving from lying on back to sitting on the side of the bed? : Unable Difficulty sitting down on and standing up from a chair with arms (e.g., wheelchair, bedside commode, etc,.)?: Unable Help needed moving to and from a bed to chair (including a wheelchair)?: Total Help needed walking in hospital room?: Total Help needed climbing 3-5 steps with a railing? : Total 6 Click Score: 6    End of Session   Activity Tolerance: Patient limited by pain Patient left: in bed;with call bell/phone within reach;with bed alarm set Nurse Communication: Mobility status PT Visit Diagnosis: Unsteadiness on feet (R26.81) Pain - Right/Left: Right Pain -  part of body: Hip     Time: 4982-6415 PT Time Calculation (min) (ACUTE ONLY): 32 min  Charges:  $Therapeutic Activity: 23-37 mins                     [place] Pager [phone] Office [phone]    Claretha Cooper 06/06/2018, 3:08 PM

## 2018-06-06 NOTE — Progress Notes (Signed)
IP PROGRESS NOTE  Subjective:   Alejandra [name] is alert.  Dr. Mayer Camel is at the bedside. Objective: Vital signs in last 24 hours: Blood pressure (!) 142/75, pulse 88, temperature 98.7 F (37.1 C), temperature source Oral, resp. rate 17, height 5\' 4"  (1.626 m), weight 123 lb (55.8 kg), SpO2 97 %.  Intake/Output from previous day: 10/23 0701 - 10/24 0700 In: 1077.6 [P.O.:520; Blood:358; IV Piggyback:199.6] Out: 2270 [Urine:2150; Drains:120]  Physical Exam:  Musculoskeletal: Right thigh gauze dressing is dry.  Small amount of blood in the Hemovac drains.     Lab Results: Recent Labs    06/05/18 0558 06/05/18 1712 06/06/18 0529  WBC 39.2*  --  33.8*  HGB 8.0* 6.9* 10.6*  HCT 24.9* 22.4* 34.0*  PLT 579*  --  518*    BMET Recent Labs    06/05/18 0558 06/06/18 0529  NA 140 142  K 3.9 4.2  CL 112* 117*  CO2 19* 20*  GLUCOSE 123* 108*  BUN 21 18  CREATININE 1.53* 1.27*  CALCIUM 7.1* 7.5*     Medications: I have reviewed the patient's current medications.  Assessment/Plan:  1. Polycythemia vera on hydroxyurea. Last phlebotomy 09/08/2015  Hydroxyurea dose increased to 1500 mg daily 06/26/2016  Hydroxyurea changed to 1500 mg daily on Monday through Friday, 1000 mg on Saturday and Sunday beginning 09/11/2016  Hydroxyurea changed to 1500 mg Monday Wednesday and Friday and 1000 mg other days beginning 10/10/2016  Hydroxyurea changed to 1000 mg daily beginning 10/31/2016  Hydroxyurea placed on hold beginning 11/27/2016-she misunderstood our instructions and continued 1000 mg daily.  Hydroxyurea dose decreased to 500 mg daily beginning 01/01/2017  Hydroxyurea dose increased 06/05/2017  Hydroxyurea dose increased to 1000 mg daily except for 500 mg on Monday, Wednesday, and Friday, 11/01/2017  Hydroxyurea discontinued 04/25/2018  Anagrelide started 04/25/2018 2. Hypertension, followed by Dr. Montez Morita.  3. History of hyperpigmentation at the hands. Question related to  hydroxyurea. 4. Hip fracture February 2013. She underwent a bipolar hip arthroplasty on 04/23/2012. 5. Thrombocytosis. Most likely secondary to polycythemia vera and iron deficiency.  Improved 6. Hospitalization with left shoulder cellulitis February 2016. 7. Pelvic fracture after a fall 05/31/2015 8. Weight loss-etiology unclear; weight stable 12/16/2015 and06/15/2017 9. Right anterior buccal ulcer 03/09/2016 10. Revision of right hip arthroplasty 1/51/7616, complicated by intraoperative and postoperative bleeding requiring multiple red cell transfusions 11. Swelling at the right hip surgical site week of 04/08/2018- 220 cc bloody fluid aspirated from the right hip surgical site 04/11/2018  Right thigh wound dehiscence prompting readmission 04/23/2018  CT 04/23/2018- gas/fluid collection at the lateral right hip with area of focal cortical destruction concerning for infection  Irrigation/debridement and replacement of right hip hardware 04/24/2018  Persistent sinus tract prompting removal of the right hip hardware and wound debridement on 06/03/2018  Postoperative bleeding prompting wound exploration, cauterization of "bleeders ", placement of Hemovacs, and a platelet transfusion in the early a.m. on 06/04/2018 12. Coagulopathy 13. Anemia secondary to surgical blood loss 14.Fever/cough, chest x-ray 05/01/2018 suggestive of pneumonia  Alejandra [name] appears stable.  She was transfused with packed red blood cells yesterday.  She continues to have bleeding from the right thigh drains.  I discussed the case with Dr. Mayer Camel.  He will continue transfusion support with the plan to give another platelet transfusion if there is excessive bleeding.  He will contact hematology over the next few days as needed.  I will be out until 06/10/2018.  The PT been increased and she completed  a course of vitamin K.  The repeat PT/INR from last night is pending.  Recommendations: 1.  Monitor hemoglobin and  transfuse packed red blood cells for a significant drop 2.  Platelet transfusion and DDAVP can be given for persistent bleeding 3.  Follow-up PT/INR from 06/06/2018 4.   Continue anagrelide  I will be out until 06/10/2018.  Please call hematology as needed.   LOS: 3 days   Betsy Coder, MD   06/06/2018, 9:47 AM

## 2018-06-06 NOTE — Consult Note (Signed)
[facility] Nurse wound consult note Reason for Consult: Consult requested for sacrum.  Pt is very emaciated. Wound type: Stage 2 pressure injury was noted in the nursing flow sheet as present on admission. Pressure Injury POA: Yes Measurement: 1X.5X.1cm Wound bed: red and moist Drainage (amount, consistency, odor) small amt yellow drainage, no odor Periwound: intact skin surrounding Dressing procedure/placement/frequency: Foam dressing in place to protect and promote healing.  Pt has been on a low air loss bed to reduce pressure and is having nutritional supplements to increase protein intake. No family members present to discuss the plan of care.  Reviewed topical treatment with the patient and she verbalized understanding. Please re-consult if further assistance is needed.  Thank-you,  Julien Girt MSN, [place], [place], [place], [place]

## 2018-06-06 NOTE — Progress Notes (Signed)
PHARMACY NOTE:  ANTIMICROBIAL RENAL DOSAGE ADJUSTMENT  Current antimicrobial regimen includes a mismatch between antimicrobial dosage and estimated renal function.  As per policy approved by the Pharmacy & Therapeutics and Medical Executive Committees, the antimicrobial dosage will be adjusted accordingly.  Current antimicrobial dosage:  fluconazole  Indication: infected prosthetic hip  Renal Function:  Estimated Creatinine Clearance: 31.5 mL/min (A) (by C-G formula based on SCr of 1.27 mg/dL (H)). []      On intermittent HD, scheduled: []      On CRRT    Antimicrobial dosage has been changed to:  Fluconazole 200mg  IV daily   Additional comments:   Thank you for allowing pharmacy to be a part of this patient's care.  Doreene Eland, PharmD, BCPS.   Work Cell: [phone] 06/06/2018 10:01 AM

## 2018-06-07 DIAGNOSIS — B9689 Other specified bacterial agents as the cause of diseases classified elsewhere: Secondary | ICD-10-CM

## 2018-06-07 DIAGNOSIS — Z95828 Presence of other vascular implants and grafts: Secondary | ICD-10-CM

## 2018-06-07 DIAGNOSIS — Z9889 Other specified postprocedural states: Secondary | ICD-10-CM

## 2018-06-07 DIAGNOSIS — M0080 Arthritis due to other bacteria, unspecified joint: Secondary | ICD-10-CM

## 2018-06-07 DIAGNOSIS — T8450XD Infection and inflammatory reaction due to unspecified internal joint prosthesis, subsequent encounter: Secondary | ICD-10-CM

## 2018-06-07 LAB — BPAM RBC
BLOOD PRODUCT EXPIRATION DATE: 201911182359
BLOOD PRODUCT EXPIRATION DATE: 201911182359
BLOOD PRODUCT EXPIRATION DATE: 201911182359
BLOOD PRODUCT EXPIRATION DATE: 201911212359
BLOOD PRODUCT EXPIRATION DATE: 201911212359
Blood Product Expiration Date: 201911212359
Blood Product Expiration Date: 201911212359
ISSUE DATE / TIME: 201910212120
ISSUE DATE / TIME: 201910212348
ISSUE DATE / TIME: 201910220951
ISSUE DATE / TIME: 201910232046
ISSUE DATE / TIME: 201910221233
ISSUE DATE / TIME: 201910240029
UNIT TYPE AND RH: 6200
UNIT TYPE AND RH: 6200
Unit Type and Rh: 6200
Unit Type and Rh: 6200
Unit Type and Rh: 6200
Unit Type and Rh: 6200
Unit Type and Rh: 6200

## 2018-06-07 LAB — TYPE AND SCREEN
ABO/RH(D): A POS
Antibody Screen: POSITIVE
DAT, IGG: NEGATIVE
DONOR AG TYPE: NEGATIVE
Donor AG Type: NEGATIVE
Donor AG Type: NEGATIVE
Donor AG Type: NEGATIVE
Donor AG Type: NEGATIVE
Donor AG Type: NEGATIVE
Donor AG Type: NEGATIVE
UNIT DIVISION: 0
UNIT DIVISION: 0
UNIT DIVISION: 0
UNIT DIVISION: 0
UNIT DIVISION: 0
UNIT DIVISION: 0
Unit division: 0

## 2018-06-07 LAB — BASIC METABOLIC PANEL
Anion gap: 6 (ref 5–15)
BUN: 14 mg/dL (ref 8–23)
CALCIUM: 7.9 mg/dL — AB (ref 8.9–10.3)
CO2: 20 mmol/L — ABNORMAL LOW (ref 22–32)
Chloride: 114 mmol/L — ABNORMAL HIGH (ref 98–111)
Creatinine, Ser: 0.83 mg/dL (ref 0.44–1.00)
GFR calc Af Amer: >60 mL/min (ref >60)
GLUCOSE: 135 mg/dL — AB (ref 70–99)
Potassium: 4.3 mmol/L (ref 3.5–5.1)
SODIUM: 140 mmol/L (ref 135–145)

## 2018-06-07 LAB — CBC
HCT: 35.5 % — ABNORMAL LOW (ref 36.0–46.0)
HEMOGLOBIN: 11 g/dL — AB (ref 12.0–15.0)
MCH: 26.4 pg (ref 26.0–34.0)
MCHC: 31 g/dL (ref 30.0–36.0)
MCV: 85.3 fL (ref 80.0–100.0)
NRBC: 0.1 % (ref 0.0–0.2)
PLATELETS: 522 10*3/uL — AB (ref 150–400)
RBC: 4.16 MIL/uL (ref 3.87–5.11)
RDW: 20.7 % — AB (ref 11.5–15.5)
WBC: 38.6 10*3/uL — AB (ref 4.0–10.5)

## 2018-06-07 MED ORDER — SODIUM CHLORIDE 0.9 % IV SOLN
2.0000 g | Freq: Two times a day (BID) | INTRAVENOUS | Status: DC
  Administered 2018-06-07 – 2018-06-13 (×13): 2 g via INTRAVENOUS
  Filled 2018-06-07 (×13): qty 2

## 2018-06-07 MED ORDER — FLUCONAZOLE IN SODIUM CHLORIDE 400-0.9 MG/200ML-% IV SOLN
400.0000 mg | INTRAVENOUS | Status: DC
  Administered 2018-06-07 – 2018-06-12 (×6): 400 mg via INTRAVENOUS
  Filled 2018-06-07 (×7): qty 200

## 2018-06-07 MED ORDER — VANCOMYCIN HCL IN DEXTROSE 750-5 MG/150ML-% IV SOLN
750.0000 mg | INTRAVENOUS | Status: DC
  Administered 2018-06-07 – 2018-06-12 (×6): 750 mg via INTRAVENOUS
  Filled 2018-06-07 (×8): qty 150

## 2018-06-07 NOTE — Progress Notes (Signed)
Pharmacy Antibiotic Note  Alejandra [name] is a 77 y.o. female admitted on 06/03/2018 with recurrent THA infection.  Pharmacy has been consulted for Vancomcyin dosing.  Plan:  Increase Ceftazidime 2g IV q12h  Increase to Vancomycin 750 mg IV q24h  Increase to Fluconazole 400 mg IV q24  Check vancomycin levels when at steady state.  Goal AUC 400-500.  Follow up renal fxn, culture results, and clinical course.   Height: 5\' 4"  (162.6 cm) Weight: 123 lb (55.8 kg) IBW/kg (Calculated) : 54.7  Temp (24hrs), Avg:98.5 F (36.9 C), Min:98.2 F (36.8 C), Max:99 F (37.2 C)  Recent Labs  Lab 06/03/18 1304 06/04/18 0123 06/04/18 0602 06/04/18 1649 06/05/18 0558 06/06/18 0529 06/07/18 0527  WBC 33.3* 58.8* 62.3*  --  39.2* 33.8* 38.6*  CREATININE 0.65  --  1.07*  --  1.53* 1.27* 0.83  VANCORANDOM  --   --   --  8  --   --   --     Estimated Creatinine Clearance: 48.2 mL/min (by C-G formula based on SCr of 0.83 mg/dL).    No Known Allergies  Antimicrobials this admission: Outpatient Vanc and Ceftazidime 9/11 >> 10/18 Inpatient:  Ceftazidime 10/21 >>  Vancomycin 10/21 >>  Augmentin 10/25 >>   Drug level/ Dose adjustments this admission: 10/22 Vanc random level = 8 mcg/mL, obtained 24 hours after initial dose.  Continue prior dose of vancomycin 750mg  q24h 10/23 SCr increased, reduce frequency of vancomycin and ceftazidime. 10/24 fluconazole 400>200 mg IV q24 10/25 Incr vanc to 750 q24, Incr Ceftaz to 2 gm q12, Incr fluconazole to 400 q24  Microbiology results: 10/23 MRSA PCR: neg 10/21 Tissue (right hip@ 1625):  gram stain no organisms; Cx few candida tropicalis  10/21 Tissue (right hip@ 1635):  gram stain GNR; Cx ngtd  Thank you for allowing pharmacy to be a part of this patient's care.  Eudelia Bunch, Pharm.D [phone] 06/07/2018 10:55 AM

## 2018-06-07 NOTE — Progress Notes (Signed)
Physical Therapy Treatment Patient Details Name: Alejandra [name] MRN: [id number] DOB: 12-19-1940 Today's Date: 06/07/2018    History of Present Illness Kiyah M Jensen 77 y.o. female with hx of Polycythemia vera, and previous right hip hemiarthroplasty in 2013 with multiple surgeries. Admitted 10/21 from SNF   for recurrent right THA infection. Radical irrigation and debridement of infected right total hip with removal of all hardware, placement of acetabular spacer and wound VAC. On  early AM 06/04/18 returned to surgery  emergently for evacuation of hematoma. H/O glaucoma    PT Comments    Patient's MS appears improved. More cheerful and talkative. Assisted to reposition after dressing changed. Will assist with sitting next visit.   Follow Up Recommendations  SNF     Equipment Recommendations  None recommended by PT    Recommendations for Other Services       Precautions / Restrictions Precautions Precautions: Posterior Hip;Fall Precaution Comments: per Dr. Mayer Camel note 10/24- essentially a girdlestone on right with PWB_no % noted, previous posterior hip precaautions, 2 hemavacs on right hip Restrictions RLE Weight Bearing: Partial weight bearing    Mobility  Bed Mobility Overal bed mobility: Needs Assistance Bed Mobility: Rolling Rolling: Max assist;+2 for safety/equipment;+2 for physical assistance         General bed mobility comments: patient assisted self in reaching for rail to roll to left for dressing chasnge by RN and to reposition off of the back. Pillows placed between knees and to back. Patient had not been medicated so will arrange for mobility/sitting next visit after medication given.  Transfers                    Ambulation/Gait                 Stairs             Wheelchair Mobility    Modified Rankin (Stroke Patients Only)       Balance                                            Cognition  Arousal/Alertness: Awake/alert                               Safety/Judgement: Decreased awareness of safety;Decreased awareness of deficits     General Comments: patient appears more alert today, able to converse and did participate in bed mobility      Exercises      General Comments        Pertinent Vitals/Pain Faces Pain Scale: Hurts even more Pain Location: right hip when moving Pain Descriptors / Indicators: Discomfort;Grimacing Pain Intervention(s): Repositioned;Monitored during session    Home Living                      Prior Function            PT Goals (current goals can now be found in the care plan section) Progress towards PT goals: Progressing toward goals    Frequency    Min 2X/week      PT Plan Current plan remains appropriate    Co-evaluation              AM-PAC PT "6 Clicks" Daily Activity  Outcome Measure  Difficulty turning over in bed (including adjusting  bedclothes, sheets and blankets)?: Unable Difficulty moving from lying on back to sitting on the side of the bed? : Unable Difficulty sitting down on and standing up from a chair with arms (e.g., wheelchair, bedside commode, etc,.)?: Unable Help needed moving to and from a bed to chair (including a wheelchair)?: Total Help needed walking in hospital room?: Total Help needed climbing 3-5 steps with a railing? : Total 6 Click Score: 6    End of Session   Activity Tolerance: Patient tolerated treatment well Patient left: in bed;with nursing/sitter in room Nurse Communication: Mobility status;Need for lift equipment PT Visit Diagnosis: Unsteadiness on feet (R26.81) Pain - Right/Left: Right Pain - part of body: Hip     Time: 8676-7209 PT Time Calculation (min) (ACUTE ONLY): 9 min  Charges:  $Therapeutic Activity: 8-22 mins                     Tresa Endo PT Acute Rehabilitation Services Pager [phone] Office [phone]    Claretha Cooper 06/07/2018, 5:01 PM

## 2018-06-07 NOTE — Progress Notes (Addendum)
[name]  MRN: [id number]  DOB/AGE:  Jun 19, 1941 / 77 y.o.  3 Days Post-Op Procedure(s) (LRB): WOUND EXPLORATION evacuation hematoma (Right)    PROGRESS NOTE Subjective: Patient is alert, oriented, no Nausea, no Vomiting, yes passing gas, . Taking PO with small sips. Denies SOB, Chest or Calf Pain. Using Incentive Spirometer, PAS in place. Ambulate bed to chair with max assistance Patient reports pain as  moderate .    Objective: Vital signs in last 24 hours: Vitals:   06/07/18 0330 06/07/18 0400 06/07/18 0515 06/07/18 0600  BP: (!) 152/83  (!) 150/68 (!) 154/69  Pulse:      Resp: 13  13 12   Temp:  98.2 F (36.8 C)    TempSrc:  Axillary    SpO2:      Weight:      Height:          Intake/Output from previous day: I/O last 3 completed shifts: In: 2830 [P.O.:400; I.V.:1422.4; Blood:358; IV Piggyback:649.6] Out: 7425 [Urine:4000; Drains:430]   Intake/Output this shift: No intake/output data recorded.   LABORATORY DATA: Recent Labs    06/06/18 0529 06/06/18 1823 06/07/18 0527  WBC 33.8*  --  38.6*  HGB 10.6*  --  11.0*  HCT 34.0*  --  35.5*  PLT 518*  --  522*  NA 142  --  140  K 4.2  --  4.3  CL 117*  --  114*  CO2 20*  --  20*  BUN 18  --  14  CREATININE 1.27*  --  0.83  GLUCOSE 108*  --  135*  INR  --  1.32  --   CALCIUM 7.5*  --  7.9*    Examination: Neurologically intact Neurovascular intact Sensation intact distally Intact pulses distally Dorsiflexion/Plantar flexion intact Incision: dressing C/D/I and drains in place and put out around 300 ml} XR AP&Lat of hip shows well placed\fixed THA  Assessment:   3 Days Post-Op Procedure(s) (LRB): WOUND EXPLORATION evacuation hematoma (Right) ADDITIONAL DIAGNOSIS:  Expected Acute Blood Loss Anemia,   2.Acute renal failure secondary to volume changes during the immediate perioperative period.  levels now normalized.  3. Gram-negative deep wound infection,After complex revision right total up  arthroplasty with postoperative bleeding and hematoma secondary to polycythemia vera.  4. Polycythemia vera, involving all 3 blood lines, with severe platelet dysfunction despite platelet counts That are routinely over 500,000  5. Stage 2 pressure ulcer to sacrumj  Plan: PT- Patient essentially is a Girdlestone arthroplasty on the right side.  Her goals to get her bed to chair with a walker.  She is partial weightbearing on the right side  DVT Prophylaxis:Because of the patient's extensive bleeding after her surgeries, no DVT chemoprophylaxis.  PAS are indicated. Hematology is following her, and will assist with management.  For her deep wound infection: all hardware is been removed and there are deep drains in place.  They will stay in place until her drainage drops down well below 50 cc.  She will continue IV antibiotics per infectious diseases and we await the results of her cultures.  Hopefully, as her condition stabilizes, we will be able to get her back up to the orthopedic floor in 3 W.  Regarding her acute renal failure, this is probably from volume shifts related to her surgery.  Levels now stable  DISCHARGE PLAN: Skilled Nursing Facility/Rehab  DISCHARGE NEEDS: HHPT, Walker and 3-in-1 comode seat

## 2018-06-07 NOTE — Progress Notes (Signed)
[facility] Infusion Coordinator will follow pt with ID team to support Home Infusion Pharm[facility] services for home IV ABX as ordered at DC.  If patient discharges after hours, please call [phone].   Alejandra [name] 06/07/2018, 6:58 PM

## 2018-06-07 NOTE — Progress Notes (Signed)
INFECTIOUS DISEASE PROGRESS NOTE  ID: Alejandra [name] is a 77 y.o. female with  Active Problems:   Infection of prosthetic total hip joint ([place])   Pressure injury of skin  Subjective: Resting quietly.   Abtx:  Anti-infectives (From admission, onward)   Start     Dose/Rate Route Frequency Ordered Stop   06/07/18 0800  amoxicillin-clavulanate (AUGMENTIN) 500-125 MG per tablet 500 mg  Status:  Discontinued     1 tablet Oral 2 times daily 06/03/18 2224 06/05/18 1620   06/06/18 1600  fluconazole (DIFLUCAN) IVPB 200 mg     200 mg 100 mL/hr over 60 Minutes Intravenous Every 24 hours 06/06/18 1000     06/06/18 1000  vancomycin (VANCOCIN) IVPB 750 mg/150 ml premix     750 mg 150 mL/hr over 60 Minutes Intravenous Every 36 hours 06/05/18 1114     06/06/18 1000  cefTAZidime (FORTAZ) 2 g in sodium chloride 0.9 % 100 mL IVPB     2 g 200 mL/hr over 30 Minutes Intravenous Every 24 hours 06/05/18 1149     06/05/18 1700  fluconazole (DIFLUCAN) IVPB 400 mg  Status:  Discontinued     400 mg 100 mL/hr over 120 Minutes Intravenous Every 24 hours 06/05/18 1620 06/06/18 1000   06/04/18 2000  vancomycin (VANCOCIN) IVPB 750 mg/150 ml premix  Status:  Discontinued     750 mg 150 mL/hr over 60 Minutes Intravenous Every 24 hours 06/04/18 1906 06/05/18 1114   06/04/18 0713  vancomycin variable dose per unstable renal function (pharmacist dosing)  Status:  Discontinued      Does not apply See admin instructions 06/04/18 0714 06/04/18 1916   06/03/18 2330  ceftAZIDime (FORTAZ) IVPB 2 g  Status:  Discontinued     2 g 100 mL/hr over 30 Minutes Intravenous 2 times daily 06/03/18 2224 06/03/18 2321   06/03/18 2330  cefTAZidime (FORTAZ) 2 g in sodium chloride 0.9 % 100 mL IVPB  Status:  Discontinued     2 g 200 mL/hr over 30 Minutes Intravenous Every 12 hours 06/03/18 2322 06/05/18 1149   06/03/18 2230  vancomycin IVPB  Status:  Discontinued     750 mg Intravenous Every 24 hours 06/03/18 2224 06/03/18 2323   06/03/18 1710  tobramycin (NEBCIN) powder  Status:  Discontinued       As needed 06/03/18 1812 06/03/18 1812   06/03/18 1710  vancomycin (VANCOCIN) powder  Status:  Discontinued       As needed 06/03/18 1812 06/03/18 1812   06/03/18 1530  cefTAZidime (FORTAZ) 2 g in sodium chloride 0.9 % 100 mL IVPB     2 g 200 mL/hr over 30 Minutes Intravenous  Once 06/03/18 1521 06/03/18 1629   06/03/18 1530  vancomycin (VANCOCIN) IVPB 750 mg/150 ml premix     750 mg 150 mL/hr over 60 Minutes Intravenous  Once 06/03/18 1521 06/03/18 1650      Medications:  Scheduled: . amLODipine  10 mg Oral Daily  . anagrelide  1 mg Oral BID  . Chlorhexidine Gluconate Cloth  6 each Topical Daily  . docusate sodium  100 mg Oral BID  . feeding supplement (ENSURE ENLIVE)  237 mL Oral TID BM  . gabapentin  300 mg Oral BID  . latanoprost  1 drop Both Eyes QHS  . mouth rinse  15 mL Mouth Rinse BID  . pantoprazole  40 mg Oral Daily  . rosuvastatin  5 mg Oral QPM  . sodium chloride flush  10 mL Intravenous Q12H  . sodium chloride flush  10-40 mL Intracatheter Q12H  . vitamin C  500 mg Oral Daily    Objective: Vital signs in last 24 hours: Temp:  [98.2 F (36.8 C)-99 F (37.2 C)] 98.7 F (37.1 C) (10/25 0800) Pulse Rate:  [103-122] 104 (10/25 0200) Resp:  [12-27] 12 (10/25 0600) BP: (145-195)/(57-92) 154/69 (10/25 0600) SpO2:  [94 %-100 %] 95 % (10/25 0200)   General appearance: no distress Resp: clear to auscultation bilaterally Cardio: regular rate and rhythm GI: normal findings: bowel sounds normal and soft, non-tender  Lab Results Recent Labs    06/06/18 0529 06/07/18 0527  WBC 33.8* 38.6*  HGB 10.6* 11.0*  HCT 34.0* 35.5*  NA 142 140  K 4.2 4.3  CL 117* 114*  CO2 20* 20*  BUN 18 14  CREATININE 1.27* 0.83   Liver Panel No results for input(s): PROT, ALBUMIN, AST, ALT, ALKPHOS, BILITOT, BILIDIR, IBILI in the last 72 hours. Sedimentation Rate No results for input(s): ESRSEDRATE in the last  72 hours. C-Reactive Protein No results for input(s): CRP in the last 72 hours.  Microbiology: Recent Results (from the past 240 hour(s))  Aerobic/Anaerobic Culture (surgical/deep wound)     Status: None (Preliminary result)   Collection Time: 06/03/18  4:25 PM  Result Value Ref Range Status   Specimen Description   Final    WOUND RIGHT HIP Performed at [facility], [place] [address] [place], [place] [address]    Special Requests   Final    NONE Performed at [facility] [place], [place] [address] [place], [place] [address]    Gram Stain   Final    RARE WBC PRESENT, PREDOMINANTLY PMN NO ORGANISMS SEEN Performed at [facility], [place] [address], [place], [place] [address]    Culture   Final    FEW CANDIDA TROPICALIS Sent to [facility] for further susceptibility testing. NO ANAEROBES ISOLATED; CULTURE IN PROGRESS FOR 5 DAYS    Report Status PENDING  Incomplete  Aerobic/Anaerobic Culture (surgical/deep wound)     Status: None (Preliminary result)   Collection Time: 06/03/18  4:35 PM  Result Value Ref Range Status   Specimen Description   Final    TISSUE RIGHT HIP Performed at [facility] [address], [place], [place] [address]    Special Requests   Final    NONE Performed at [facility], [place] [address], [place], [place] [address]    Gram Stain   Final    RARE WBC PRESENT, PREDOMINANTLY PMN RARE GRAM NEGATIVE RODS    Culture   Final    NO GROWTH 3 DAYS NO ANAEROBES ISOLATED; CULTURE IN PROGRESS FOR 5 DAYS Performed at [facility], [place] [address], [place], [place] [address]    Report Status PENDING  Incomplete  MRSA PCR Screening     Status: None   Collection Time: 06/05/18 12:13 PM  Result Value Ref Range Status   MRSA by PCR NEGATIVE NEGATIVE Final    Comment:        The GeneXpert MRSA Assay (FDA approved for NASAL specimens only), is one component of a comprehensive MRSA  colonization surveillance program. It is not intended to diagnose MRSA infection nor to guide or monitor treatment for MRSA infections. Performed at [facility], [place] [address] [place], [place] [address]     Studies/Results: No results found.   Assessment/Plan: Wound Infection- C tropicalis,  GNR Prosthetic Joint Resection P vera  Total days of antibiotics: 2 flucon, 35 ceftaz-vanco                                                              Await further info from her Cx (Candida sensi will take 1-2 weeks, GNR ID) H/H stable after transfusion Continue her current anbx She has PIC         Bobby Rumpf MD, FACP Infectious Diseases (pager) [phone] [email or web address] 06/07/2018, 8:52 AM  LOS: 4 days

## 2018-06-08 LAB — AEROBIC/ANAEROBIC CULTURE W GRAM STAIN (SURGICAL/DEEP WOUND)

## 2018-06-08 LAB — AEROBIC/ANAEROBIC CULTURE (SURGICAL/DEEP WOUND): CULTURE: NO GROWTH

## 2018-06-08 NOTE — Plan of Care (Signed)

## 2018-06-08 NOTE — Progress Notes (Addendum)
[name]  MRN: [id number]  DOB/AGE:  11-21-1940 / 77 y.o.  4 Days Post-Op Procedure(s) (LRB): WOUND EXPLORATION evacuation hematoma (Right)    PROGRESS NOTE Subjective: Patient is alert, oriented, no Nausea, no Vomiting, yes passing gas, . Taking [name] well. Denies SOB, Chest or Calf Pain. Using Incentive Spirometer, PAS in place. Ambulate PWB Patient reports pain as  3/10.  Patient reports that her pain has diminished and her appetite is improved over the last 24 hours.  Objective: Vital signs in last 24 hours: Vitals:   06/07/18 0800 06/07/18 1345 06/07/18 2203 06/08/18 0513  BP: (Abnormal) 172/80 140/77 (Abnormal) 143/96 (Abnormal) 149/84  Pulse:  (Abnormal) 117 (Abnormal) 105 93  Resp: 14 16 17    Temp: 98.7 F (37.1 C) 99.4 F (37.4 C) 99.1 F (37.3 C) 98.7 F (37.1 C)  TempSrc: Oral Oral Oral Oral  SpO2:  100% 98% 98%  Weight:      Height:          Intake/Output from previous day: I/O last 3 completed shifts: In: 4859.2 [P.O.:240; I.V.:4269.2; IV Piggyback:350] Out: 4967 [Urine:4400; Drains:625]   Intake/Output this shift: No intake/output data recorded.   LABORATORY DATA: Recent Labs    06/06/18 0529 06/06/18 1823 06/07/18 0527  WBC 33.8*  --  38.6*  HGB 10.6*  --  11.0*  HCT 34.0*  --  35.5*  PLT 518*  --  522*  NA 142  --  140  K 4.2  --  4.3  CL 117*  --  114*  CO2 20*  --  20*  BUN 18  --  14  CREATININE 1.27*  --  0.83  GLUCOSE 108*  --  135*  INR  --  1.32  --   CALCIUM 7.5*  --  7.9*   1 culture did show gram-negative rods and fortunately it is not actually grown out anything yet.  She remains on broad-spectrum antibiotics.  Examination: Neurologically intact ABD soft Neurovascular intact Sensation intact distally Intact pulses distally Dorsiflexion/Plantar flexion intact Incision: dressing C/D/I No cellulitis present Compartment soft} Patient does have stage II sacral decubitus ulcer from pressure.  That was first noticed  on this admission.  Assessment:   4 Days Post-Op Procedure(s) (LRB): WOUND EXPLORATION evacuation hematoma (Right) ADDITIONAL DIAGNOSIS:  Expected Acute Blood Loss Anemia, tri-clonal polycythemia vera Stage II sacral decubitus ulcer first noted at the time of this admission  Plan: Patient has a Girdlestone arthroplasty will be partial weightbearing at best I would work on bed to chair at this time.  I will recheck her CBC tomorrow.  DVT Prophylaxis: None because of the patient's polycythemia vera which is resulted and significant bleeding after all of her surgeries.  DISCHARGE PLAN: Skilled Nursing Facility/Rehab, this will occur when the drainage from the wound drops below 50 cc/day in the last 24 hours it was 290 cc.  DISCHARGE NEEDS: HHPT, CPM, 3-in-1 comode seat and IV AntibioticsPatient ID: Alejandra [name], female   DOB: 12/18/1940, 77 y.o. y.o.   MRN: [id number]

## 2018-06-09 LAB — CBC WITH DIFFERENTIAL/PLATELET
Abs Immature Granulocytes: 0.97 10*3/uL — ABNORMAL HIGH (ref 0.00–0.07)
BASOS ABS: 0.2 10*3/uL — AB (ref 0.0–0.1)
Basophils Relative: 1 %
EOS PCT: 5 %
Eosinophils Absolute: 1.7 10*3/uL — ABNORMAL HIGH (ref 0.0–0.5)
HCT: 37.7 % (ref 36.0–46.0)
HEMOGLOBIN: 11.2 g/dL — AB (ref 12.0–15.0)
Immature Granulocytes: 3 %
Lymphocytes Relative: 2 %
Lymphs Abs: 0.7 10*3/uL (ref 0.7–4.0)
MCH: 25.8 pg — AB (ref 26.0–34.0)
MCHC: 29.7 g/dL — ABNORMAL LOW (ref 30.0–36.0)
MCV: 86.9 fL (ref 80.0–100.0)
Monocytes Absolute: 1.3 10*3/uL — ABNORMAL HIGH (ref 0.1–1.0)
Monocytes Relative: 3 %
NRBC: 0 % (ref 0.0–0.2)
Neutro Abs: 33.2 10*3/uL — ABNORMAL HIGH (ref 1.7–7.7)
Neutrophils Relative %: 86 %
PLATELETS: 429 10*3/uL — AB (ref 150–400)
RBC: 4.34 MIL/uL (ref 3.87–5.11)
RDW: 21.9 % — ABNORMAL HIGH (ref 11.5–15.5)
WBC: 38 10*3/uL — AB (ref 4.0–10.5)

## 2018-06-09 LAB — BASIC METABOLIC PANEL
Anion gap: 6 (ref 5–15)
BUN: 12 mg/dL (ref 8–23)
CHLORIDE: 112 mmol/L — AB (ref 98–111)
CO2: 20 mmol/L — ABNORMAL LOW (ref 22–32)
CREATININE: 0.8 mg/dL (ref 0.44–1.00)
Calcium: 7.9 mg/dL — ABNORMAL LOW (ref 8.9–10.3)
GFR calc Af Amer: >60 mL/min (ref >60)
GFR calc non Af Amer: >60 mL/min (ref >60)
GLUCOSE: 129 mg/dL — AB (ref 70–99)
Potassium: 4.7 mmol/L (ref 3.5–5.1)
SODIUM: 138 mmol/L (ref 135–145)

## 2018-06-09 NOTE — Progress Notes (Addendum)
NT Alejandra [name] informed me pt's left hand and arm appeared to be swollen. Confirmed this by observation. Noted she had small dsg to left forearm & records show she had iv site there, discontinued on 10/22. Hand & arm elevated on pillow. Davaughn Hillyard, [employer]

## 2018-06-09 NOTE — NC FL2 (Signed)
[place] LEVEL OF CARE SCREENING TOOL     IDENTIFICATION  Patient Name: Alejandra [name] Birthdate: [birth year] Sex: female Admission Date (Current Location): 06/03/2018  [facility] and [place] Number:  Herbalist and Address:  [facility],  [place], [place]      Provider Number: [id number]  Attending Physician Name and Address:  Frederik Pear, MD  Relative Name and Phone Number:  Rosa [place]: [phone]    Current Level of Care: Hospital Recommended Level of Care: [place] Prior Approval Number:    Date Approved/Denied:   PASRR Number: [id number]  Discharge Plan: SNF    Current Diagnoses: Patient Active Problem List   Diagnosis Date Noted  . Pressure injury of skin 06/05/2018  . Infection of prosthetic total hip joint ([place]) 05/28/2018  . Postoperative wound dehiscence 04/25/2018  . Malnutrition of moderate degree 04/24/2018  . History of total hip arthroplasty, right 04/23/2018  . Hematoma of right hip 04/15/2018  . Right hip pain 04/13/2018  . Fall at home, initial encounter 04/13/2018  . AKI (acute kidney injury) ([place]) 04/13/2018  . Elevated troponin 04/13/2018  . Possible Acute lower UTI 04/13/2018  . Sepsis ([place]) 04/12/2018  . UTI (urinary tract infection) 04/12/2018  . Primary osteoarthritis of right hip 02/04/2018  . Failed total hip arthroplasty ([place]) 02/01/2018  . Pre-operative cardiovascular examination 01/31/2018  . Abnormal EKG 01/31/2018  . Vertigo 09/28/2014  . Essential hypertension 09/28/2014  . Thrombocytosis ([place]) 04/23/2012  . Polycythemia vera ([place]) 12/22/2011    Orientation RESPIRATION BLADDER Height & Weight     Situation, Time, Self, Place  Normal External catheter Weight: 123 lb (55.8 kg) Height:  5\' 4"  (162.6 cm)  BEHAVIORAL SYMPTOMS/MOOD NEUROLOGICAL BOWEL NUTRITION STATUS      Continent Diet  AMBULATORY STATUS COMMUNICATION OF NEEDS Skin   Extensive Assist  Verbally PU Stage and Appropriate Care, Surgical wounds(PU: Buttocks, R hip surgical incision)                       Personal Care Assistance Level of Assistance  Bathing, Feeding, Dressing Bathing Assistance: Limited assistance Feeding assistance: Limited assistance Dressing Assistance: Limited assistance     Functional Limitations Info  Hearing, Sight, Speech Sight Info: Adequate Hearing Info: Adequate Speech Info: Adequate    SPECIAL CARE FACTORS FREQUENCY  PT (By licensed PT), OT (By licensed OT)     PT Frequency: 5x/week OT Frequency: 5x/week            Contractures Contractures Info: Not present    Additional Factors Info  Allergies, Code Status, Psychotropic Code Status Info: Full Allergies Info: NKA Psychotropic Info: PRN Xanax         Current Medications (06/09/2018):  This is the current hospital active medication list Current Facility-Administered Medications  Medication Dose Route Frequency Provider Last Rate Last Dose  . 0.9 %  sodium chloride infusion   Intravenous PRN Frederik Pear, MD   Stopped at 06/08/18 1226  . acetaminophen (TYLENOL) tablet 325-650 mg  325-650 mg Oral Q6H PRN Leighton Parody, PA-C   650 mg at 06/04/18 1013  . ALPRAZolam Duanne Moron) tablet 0.25 mg  0.25 mg Oral BID PRN Leighton Parody, PA-C   0.25 mg at 06/08/18 2043  . alum & mag hydroxide-simeth (MAALOX/MYLANTA) 200-200-20 MG/5ML suspension 30 mL  30 mL Oral Q4H PRN Joanell Rising K, PA-C      . amLODipine (NORVASC) tablet 10 mg  10 mg Oral Daily Leighton Parody, PA-C   10 mg at 06/09/18 2440  . anagrelide (AGRYLIN) capsule 1 mg  1 mg Oral BID Leighton Parody, PA-C   1 mg at 06/09/18 1027  . bisacodyl (DULCOLAX) EC tablet 5 mg  5 mg Oral Daily PRN Leighton Parody, PA-C   5 mg at 06/09/18 2536  . cefTAZidime (FORTAZ) 2 g in sodium chloride 0.9 % 100 mL IVPB  2 g Intravenous Q12H BellSharyn Lull T, RPH 200 mL/hr at 06/09/18 1000 2 g at 06/09/18 1000  . Chlorhexidine Gluconate  Cloth 2 % PADS 6 each  6 each Topical Daily Frederik Pear, MD   6 each at 06/08/18 1050  . dextrose 5 % and 0.45 % NaCl with KCl 20 mEq/L infusion   Intravenous Continuous Leighton Parody, PA-C 125 mL/hr at 06/09/18 0830    . diphenhydrAMINE (BENADRYL) 12.5 MG/5ML elixir 12.5-25 mg  12.5-25 mg Oral Q4H PRN Leighton Parody, PA-C      . docusate sodium (COLACE) capsule 100 mg  100 mg Oral BID Leighton Parody, PA-C   100 mg at 06/09/18 6440  . feeding supplement (ENSURE ENLIVE) (ENSURE ENLIVE) liquid 237 mL  237 mL Oral TID BM Joanell Rising K, PA-C   237 mL at 06/09/18 0951  . fluconazole (DIFLUCAN) IVPB 400 mg  400 mg Intravenous Q24H Leodis Sias T, RPH 100 mL/hr at 06/08/18 1430 400 mg at 06/08/18 1430  . gabapentin (NEURONTIN) capsule 300 mg  300 mg Oral BID Leighton Parody, PA-C   300 mg at 06/09/18 3474  . HYDROmorphone (DILAUDID) injection 0.5-1 mg  0.5-1 mg Intravenous Q4H PRN Leighton Parody, PA-C   0.5 mg at 06/04/18 2595  . latanoprost (XALATAN) 0.005 % ophthalmic solution 1 drop  1 drop Both Eyes QHS Leighton Parody, PA-C   1 drop at 06/08/18 2034  . loratadine (CLARITIN) tablet 10 mg  10 mg Oral Daily PRN Leighton Parody, PA-C      . meclizine (ANTIVERT) tablet 25 mg  25 mg Oral TID PRN Leighton Parody, PA-C      . MEDLINE mouth rinse  15 mL Mouth Rinse BID Frederik Pear, MD   15 mL at 06/08/18 2105  . menthol-cetylpyridinium (CEPACOL) lozenge 3 mg  1 lozenge Oral PRN Leighton Parody, PA-C       Or  . phenol (CHLORASEPTIC) mouth spray 1 spray  1 spray Mouth/Throat PRN Leighton Parody, PA-C      . methocarbamol (ROBAXIN) tablet 500 mg  500 mg Oral Q6H PRN Leighton Parody, PA-C   500 mg at 06/07/18 1628  . metoCLOPramide (REGLAN) tablet 5-10 mg  5-10 mg Oral Q8H PRN Leighton Parody, PA-C       Or  . metoCLOPramide (REGLAN) injection 5-10 mg  5-10 mg Intravenous Q8H PRN Leighton Parody, PA-C      . ondansetron [facility]) tablet 4 mg  4 mg Oral Q6H PRN Leighton Parody, PA-C        Or  . ondansetron [facility]) injection 4 mg  4 mg Intravenous Q6H PRN Leighton Parody, PA-C   4 mg at 06/04/18 0949  . oxyCODONE (Oxy IR/ROXICODONE) immediate release tablet 5-10 mg  5-10 mg Oral Q4H PRN Leighton Parody, PA-C   5 mg at 06/08/18 2043  . pantoprazole (PROTONIX) EC tablet 40 mg  40 mg Oral Daily Leighton Parody, PA-C   40 mg at  06/09/18 6546  . polyethylene glycol (MIRALAX / GLYCOLAX) packet 17 g  17 g Oral Daily PRN Leighton Parody, PA-C   17 g at 06/08/18 2043  . rosuvastatin (CRESTOR) tablet 5 mg  5 mg Oral QPM Joanell Rising K, PA-C   5 mg at 06/08/18 1819  . sodium chloride flush (NS) 0.9 % injection 10 mL  10 mL Intravenous Q12H Leighton Parody, PA-C   10 mL at 06/07/18 2210  . sodium chloride flush (NS) 0.9 % injection 10-40 mL  10-40 mL Intracatheter Q12H Frederik Pear, MD   10 mL at 06/07/18 2220  . sodium chloride flush (NS) 0.9 % injection 10-40 mL  10-40 mL Intracatheter PRN Frederik Pear, MD      . sodium phosphate (FLEET) 7-19 GM/118ML enema 1 enema  1 enema Rectal Once PRN Leighton Parody, PA-C      . vancomycin (VANCOCIN) IVPB 750 mg/150 ml premix  750 mg Intravenous Q24H Leodis Sias T, RPH 150 mL/hr at 06/09/18 1157 750 mg at 06/09/18 1157  . vitamin C (ASCORBIC ACID) tablet 500 mg  500 mg Oral Daily Leighton Parody, PA-C   500 mg at 06/09/18 5035     Discharge Medications: Please see discharge summary for a list of discharge medications.  Relevant Imaging Results:  Relevant Lab Results:   Additional Information SSN: [id number]  Pricilla Holm, [place]

## 2018-06-09 NOTE — Progress Notes (Addendum)
NT Martinique Cassell pointed out that pt has red areas and itching to chest and back. I personally confirmed this. Held CHG bath for today. Ellyce Lafevers, [employer]

## 2018-06-09 NOTE — Progress Notes (Signed)
[name]  MRN: [id number]  DOB/AGE:  [birth year] / 78 y.o.  5 Days Post-Op Procedure(s) (LRB): WOUND EXPLORATION evacuation hematoma (Right)    PROGRESS NOTE Subjective: Patient is alert, oriented, no Nausea, no Vomiting, yes passing gas, . Taking PO well. Denies SOB, Chest or Calf Pain. Using Incentive Spirometer, PAS in place. Ambulate bed to chair Patient reports pain as  3/10  .    Objective: Vital signs in last 24 hours: Vitals:   06/08/18 0513 06/08/18 1420 06/08/18 2228 06/09/18 0543  BP: (Abnormal) 149/84 133/77 (Abnormal) 142/87 (Abnormal) 141/83  Pulse: 93 (Abnormal) 116 100 (Abnormal) 108  Resp:  16 16 16   Temp: 98.7 F (37.1 C) 98.5 F (36.9 C) 98.5 F (36.9 C)   TempSrc: Oral Oral Oral   SpO2: 98% 99% 99% 96%  Weight:      Height:          Intake/Output from previous day: I/O last 3 completed shifts: In: 5288.4 [P.O.:360; I.V.:4317.9; IV Piggyback:610.5] Out: 2895 [Urine:2500; Drains:395]   Intake/Output this shift: No intake/output data recorded.   LABORATORY DATA: Recent Labs    06/06/18 1823  06/07/18 0527 06/09/18 0313  WBC  --   --  38.6* 38.0*  HGB  --   --  11.0* 11.2*  HCT  --   --  35.5* 37.7  PLT  --   --  522* 429*  NA  --   --  140 138  K  --   --  4.3 4.7  CL  --   --  114* 112*  CO2  --   --  20* 20*  BUN  --   --  14 12  CREATININE  --   --  0.83 0.80  GLUCOSE  --   --  135* 129*  INR 1.32  --   --   --   CALCIUM  --    < > 7.9* 7.9*   < > = values in this interval not displayed.    Examination: Neurologically intact ABD soft Neurovascular intact Sensation intact distally Intact pulses distally Dorsiflexion/Plantar flexion intact Incision: dressing C/D/I No cellulitis present Compartment soft} Drains are in good position and are working.  Assessment:   5 Days Post-Op Procedure(s) (LRB): WOUND EXPLORATION evacuation hematoma (Right) ADDITIONAL DIAGNOSIS:  Expected Acute Blood Loss Anemia, tri-clonal  polycythemia vera with platelet dysfunction, infected right hip wound, cultures showed gram-negative rods on the Gram stain but did not grow anything.  They also saw a few Candida albicans.  Patient still is active drainage of about 290 cc in 24 hours Plan: PT/OT WBAT, THA  We will monitor drains through tomorrow if still having significant active drainage may order CT scan to see if there are any fluid collections.  DISCHARGE PLAN: Skilled Nursing Facility/Rehab  DISCHARGE NEEDS: HHPT, Walker and 3-in-1 comode seat

## 2018-06-09 NOTE — Progress Notes (Deleted)
ID PROGRESS NOTE  Complex right hip wound s/p I x D, appears polymicrobial. Culture results showing c.tropicalis has also been isolated.  Will start on Fluconazole 400mg  po daily in addition to Iv treatment. Will likely need prolonged treatment course

## 2018-06-10 ENCOUNTER — Telehealth: Payer: Self-pay | Admitting: Oncology

## 2018-06-10 DIAGNOSIS — D45 Polycythemia vera: Secondary | ICD-10-CM

## 2018-06-10 DIAGNOSIS — L299 Pruritus, unspecified: Secondary | ICD-10-CM

## 2018-06-10 LAB — CREATININE, SERUM: Creatinine, Ser: 0.85 mg/dL (ref 0.44–1.00)

## 2018-06-10 LAB — VANCOMYCIN, PEAK: Vancomycin Pk: 29 ug/mL — ABNORMAL LOW (ref 30–40)

## 2018-06-10 NOTE — Care Management Important Message (Signed)
Important Message  Patient Details  Name: Alejandra [name] MRN: [id number] Date of Birth: [birth year]   Medicare Important Message Given:  Yes    Kerin Salen 06/10/2018, 11:58 AMImportant Message  Patient Details  Name: Alejandra [name] MRN: [id number] Date of Birth: June 15, 1941   Medicare Important Message Given:  Yes    Kerin Salen 06/10/2018, 11:58 AM

## 2018-06-10 NOTE — Progress Notes (Signed)
IP PROGRESS NOTE  Subjective:   Alejandra [name] reports pruritus.  This has been present for several days. Objective: Vital signs in last 24 hours: Blood pressure 118/75, pulse (!) 117, temperature 98.2 F (36.8 C), temperature source Oral, resp. rate 18, height 5\' 4"  (1.626 m), weight 123 lb (55.8 kg), SpO2 100 %.  Intake/Output from previous day: 10/27 0701 - 10/28 0700 In: 3051.2 [P.O.:340; I.V.:1947.4; IV Piggyback:763.8] Out: 1290 [Urine:1100; Drains:190]  Physical Exam:  Cardiac: Regular rate and rhythm Lungs: Clear anteriorly, no respiratory distress Musculoskeletal: The right thigh dressing is dry, small amount of blood in the drains and Hemovacs Skin: Erythematous rash over the trunk Vascular: Trace pitting edema at the lower leg bilaterally     Lab Results: Recent Labs    06/09/18 0313  WBC 38.0*  HGB 11.2*  HCT 37.7  PLT 429*    BMET Recent Labs    06/09/18 0313 06/10/18 0338  NA 138  --   K 4.7  --   CL 112*  --   CO2 20*  --   GLUCOSE 129*  --   BUN 12  --   CREATININE 0.80 0.85  CALCIUM 7.9*  --      Medications: I have reviewed the patient's current medications.  Assessment/Plan:  1. Polycythemia vera on hydroxyurea. Last phlebotomy 09/08/2015  Hydroxyurea dose increased to 1500 mg daily 06/26/2016  Hydroxyurea changed to 1500 mg daily on Monday through Friday, 1000 mg on Saturday and Sunday beginning 09/11/2016  Hydroxyurea changed to 1500 mg Monday Wednesday and Friday and 1000 mg other days beginning 10/10/2016  Hydroxyurea changed to 1000 mg daily beginning 10/31/2016  Hydroxyurea placed on hold beginning 11/27/2016-she misunderstood our instructions and continued 1000 mg daily.  Hydroxyurea dose decreased to 500 mg daily beginning 01/01/2017  Hydroxyurea dose increased 06/05/2017  Hydroxyurea dose increased to 1000 mg daily except for 500 mg on Monday, Wednesday, and Friday, 11/01/2017  Hydroxyurea discontinued  04/25/2018  Anagrelide started 04/25/2018 2. Hypertension, followed by Dr. Montez Morita.  3. History of hyperpigmentation at the hands. Question related to hydroxyurea. 4. Hip fracture February 2013. She underwent a bipolar hip arthroplasty on 04/23/2012. 5. Thrombocytosis. Most likely secondary to polycythemia vera and iron deficiency.  Improved 6. Hospitalization with left shoulder cellulitis February 2016. 7. Pelvic fracture after a fall 05/31/2015 8. Weight loss-etiology unclear; weight stable 12/16/2015 and06/15/2017 9. Right anterior buccal ulcer 03/09/2016 10. Revision of right hip arthroplasty 1/61/0960, complicated by intraoperative and postoperative bleeding requiring multiple red cell transfusions 11. Swelling at the right hip surgical site week of 04/08/2018- 220 cc bloody fluid aspirated from the right hip surgical site 04/11/2018  Right thigh wound dehiscence prompting readmission 04/23/2018  CT 04/23/2018- gas/fluid collection at the lateral right hip with area of focal cortical destruction concerning for infection  Irrigation/debridement and replacement of right hip hardware 04/24/2018  Persistent sinus tract prompting removal of the right hip hardware and wound debridement on 06/03/2018, cultures 06/03/2018+ for Candida tropicalis  Postoperative bleeding prompting wound exploration, cauterization of "bleeders ", placement of Hemovacs, and a platelet transfusion in the early a.m. on 06/04/2018 12. Coagulopathy 13. Anemia secondary to surgical blood loss 14.Fever/cough, chest x-ray 05/01/2018 suggestive of pneumonia  Alejandra [name] is stable from a hematologic standpoint after receiving multiple red cell transfusions last week.  The platelet count is adequately controlled with anagrelide.  She continues postoperative management per orthopedics and antibiotics as recommended by infectious disease.  She appears to have developed a drug rash.  This may be related to Augmentin she  received last week.  Recommendations: 1.  Continue anagrelide 2.  Antibiotics as recommended by infectious disease 3.  Outpatient follow-up will be scheduled at the Cancer center  Please call hematology as needed    LOS: 7 days   Betsy Coder, MD   06/10/2018, 7:23 AM

## 2018-06-10 NOTE — Progress Notes (Signed)
CSW following for SNF placement. Patient will new insurance authorization to continue to therapy and care at [facility]. Patient will need updated Physical Therapy notes.  CSW notified nursing staff.   Kathrin Greathouse, Marlinda Mike, MSW Clinical Social Worker  [phone] 06/10/2018  10:03 AM

## 2018-06-10 NOTE — Progress Notes (Addendum)
INFECTIOUS DISEASE PROGRESS NOTE  ID: Alejandra [name] is a 77 y.o. female with  Active Problems:   Infection of prosthetic total hip joint ([place])   Pressure injury of skin  Subjective: Resting quietly  Abtx:  Anti-infectives (From admission, onward)   Start     Dose/Rate Route Frequency Ordered Stop   06/07/18 1400  fluconazole (DIFLUCAN) IVPB 400 mg     400 mg 100 mL/hr over 120 Minutes Intravenous Every 24 hours 06/07/18 0856     06/07/18 1200  vancomycin (VANCOCIN) IVPB 750 mg/150 ml premix     750 mg 150 mL/hr over 60 Minutes Intravenous Every 24 hours 06/07/18 0856     06/07/18 1000  cefTAZidime (FORTAZ) 2 g in sodium chloride 0.9 % 100 mL IVPB     2 g 200 mL/hr over 30 Minutes Intravenous Every 12 hours 06/07/18 0856     06/07/18 0800  amoxicillin-clavulanate (AUGMENTIN) 500-125 MG per tablet 500 mg  Status:  Discontinued     1 tablet Oral 2 times daily 06/03/18 2224 06/05/18 1620   06/06/18 1600  fluconazole (DIFLUCAN) IVPB 200 mg  Status:  Discontinued     200 mg 100 mL/hr over 60 Minutes Intravenous Every 24 hours 06/06/18 1000 06/07/18 0856   06/06/18 1000  vancomycin (VANCOCIN) IVPB 750 mg/150 ml premix  Status:  Discontinued     750 mg 150 mL/hr over 60 Minutes Intravenous Every 36 hours 06/05/18 1114 06/07/18 0856   06/06/18 1000  cefTAZidime (FORTAZ) 2 g in sodium chloride 0.9 % 100 mL IVPB  Status:  Discontinued     2 g 200 mL/hr over 30 Minutes Intravenous Every 24 hours 06/05/18 1149 06/07/18 0856   06/05/18 1700  fluconazole (DIFLUCAN) IVPB 400 mg  Status:  Discontinued     400 mg 100 mL/hr over 120 Minutes Intravenous Every 24 hours 06/05/18 1620 06/06/18 1000   06/04/18 2000  vancomycin (VANCOCIN) IVPB 750 mg/150 ml premix  Status:  Discontinued     750 mg 150 mL/hr over 60 Minutes Intravenous Every 24 hours 06/04/18 1906 06/05/18 1114   06/04/18 0713  vancomycin variable dose per unstable renal function (pharmacist dosing)  Status:  Discontinued      Does  not apply See admin instructions 06/04/18 0714 06/04/18 1916   06/03/18 2330  ceftAZIDime (FORTAZ) IVPB 2 g  Status:  Discontinued     2 g 100 mL/hr over 30 Minutes Intravenous 2 times daily 06/03/18 2224 06/03/18 2321   06/03/18 2330  cefTAZidime (FORTAZ) 2 g in sodium chloride 0.9 % 100 mL IVPB  Status:  Discontinued     2 g 200 mL/hr over 30 Minutes Intravenous Every 12 hours 06/03/18 2322 06/05/18 1149   06/03/18 2230  vancomycin IVPB  Status:  Discontinued     750 mg Intravenous Every 24 hours 06/03/18 2224 06/03/18 2323   06/03/18 1710  tobramycin (NEBCIN) powder  Status:  Discontinued       As needed 06/03/18 1812 06/03/18 1812   06/03/18 1710  vancomycin (VANCOCIN) powder  Status:  Discontinued       As needed 06/03/18 1812 06/03/18 1812   06/03/18 1530  cefTAZidime (FORTAZ) 2 g in sodium chloride 0.9 % 100 mL IVPB     2 g 200 mL/hr over 30 Minutes Intravenous  Once 06/03/18 1521 06/03/18 1629   06/03/18 1530  vancomycin (VANCOCIN) IVPB 750 mg/150 ml premix     750 mg 150 mL/hr over 60 Minutes Intravenous  Once 06/03/18 1521 06/03/18 1650      Medications:  Scheduled: . amLODipine  10 mg Oral Daily  . anagrelide  1 mg Oral BID  . Chlorhexidine Gluconate Cloth  6 each Topical Daily  . docusate sodium  100 mg Oral BID  . feeding supplement (ENSURE ENLIVE)  237 mL Oral TID BM  . gabapentin  300 mg Oral BID  . latanoprost  1 drop Both Eyes QHS  . mouth rinse  15 mL Mouth Rinse BID  . pantoprazole  40 mg Oral Daily  . rosuvastatin  5 mg Oral QPM  . sodium chloride flush  10 mL Intravenous Q12H  . sodium chloride flush  10-40 mL Intracatheter Q12H  . vitamin C  500 mg Oral Daily    Objective: Vital signs in last 24 hours: Temp:  [98.2 F (36.8 C)-98.7 F (37.1 C)] 98.2 F (36.8 C) (10/27 2202) Pulse Rate:  [114-117] 117 (10/27 2202) Resp:  [18] 18 (10/27 2202) BP: (118-136)/(75-82) 118/75 (10/27 2202) SpO2:  [97 %-100 %] 100 % (10/27 2202)   General appearance: no  distress Resp: clear to auscultation bilaterally Cardio: regular rate and rhythm GI: normal findings: bowel sounds normal and soft, non-tender  Lab Results Recent Labs    06/09/18 0313 06/10/18 0338  WBC 38.0*  --   HGB 11.2*  --   HCT 37.7  --   NA 138  --   K 4.7  --   CL 112*  --   CO2 20*  --   BUN 12  --   CREATININE 0.80 0.85   Liver Panel No results for input(s): PROT, ALBUMIN, AST, ALT, ALKPHOS, BILITOT, BILIDIR, IBILI in the last 72 hours. Sedimentation Rate No results for input(s): ESRSEDRATE in the last 72 hours. C-Reactive Protein No results for input(s): CRP in the last 72 hours.  Microbiology: Recent Results (from the past 240 hour(s))  Aerobic/Anaerobic Culture (surgical/deep wound)     Status: None (Preliminary result)   Collection Time: 06/03/18  4:25 PM  Result Value Ref Range Status   Specimen Description   Final    WOUND RIGHT HIP Performed at [facility], [place] [address] [place], [place] [address]    Special Requests   Final    NONE Performed at [facility], [place] [address], [place], [place] [address]    Gram Stain   Final    RARE WBC PRESENT, PREDOMINANTLY PMN NO ORGANISMS SEEN    Culture   Final    FEW CANDIDA TROPICALIS Sent to [place] for further susceptibility testing. NO ANAEROBES ISOLATED Performed at [facility], [place] [address] [place], [place] [address]    Report Status PENDING  Incomplete  Aerobic/Anaerobic Culture (surgical/deep wound)     Status: None   Collection Time: 06/03/18  4:35 PM  Result Value Ref Range Status   Specimen Description   Final    TISSUE RIGHT HIP Performed at [place] [address], [place],  [address]    Special Requests   Final    NONE Performed at [facility], [place] [address], [place], [place] [address]    Gram Stain   Final    RARE WBC PRESENT, PREDOMINANTLY PMN RARE GRAM NEGATIVE RODS    Culture    Final    No growth aerobically or anaerobically. Performed at [facility], [place] [address] [place],  [address]    Report Status 06/08/2018 FINAL  Final  MRSA PCR Screening     Status: None   Collection Time: 06/05/18 12:13 PM  Result Value Ref Range Status   MRSA by PCR NEGATIVE NEGATIVE Final    Comment:        The GeneXpert MRSA Assay (FDA approved for NASAL specimens only), is one component of a comprehensive MRSA colonization surveillance program. It is not intended to diagnose MRSA infection nor to guide or monitor treatment for MRSA infections. Performed at [facility], [place] [address], [place], [place] [address]     Studies/Results: No results found.   Assessment/Plan: Wound Infection- C tropicalis, GNR Prosthetic Joint Resection P vera  Total days of antibiotics:5 flucon, 38 ceftaz-vanco  Would continue her current anbx She can f/u in ID clinic in 2-3 weeks after d/c for planning around duration of her anbx as well as her C tropicalis sensitivity testing for fluconazole.  Rash-pruritis- P vera vs augmentin vs vanco?          Bobby Rumpf MD, FACP Infectious Diseases (pager) [phone] [email or web address] 06/10/2018, 8:45 AM  LOS: 7 days

## 2018-06-10 NOTE — Telephone Encounter (Signed)
R/s appt per 10/28 sch message - unable to reach patient or leave message - sent reminder letter in the mail

## 2018-06-10 NOTE — Progress Notes (Signed)
Physical Therapy Treatment Patient Details Name: Alejandra [name] MRN: [id number] DOB: 12-17-1940 Today's Date: 06/10/2018    History of Present Illness Alejandra [name] 77 y.o. female with hx of Polycythemia vera, and previous right hip hemiarthroplasty in 2013 with multiple surgeries. Admitted 10/21 from SNF   for recurrent right THA infection. Radical irrigation and debridement of infected right total hip with removal of all hardware, placement of acetabular spacer and wound VAC. On  early AM 06/04/18 returned to surgery  emergently for evacuation of hematoma. H/O glaucoma    PT Comments    The  Patient was not as animated today, Flat affect and minimally conversive. Patient requires 2 total assist to sit up and remain sitting. Leans significantly to the left. The right thigh has significant edema, as well as trunk i . Continue PT efforts.  Due to decreased sitting balance and leaning, mechanical lift is recommended for OOB  If patient can tolerate as the lift pad will place a significant amount of pressure on the right hip and tend to abduct the leg.   Follow Up Recommendations  SNF     Equipment Recommendations  None recommended by PT    Recommendations for Other Services       Precautions / Restrictions Precautions Precautions: Posterior Hip;Fall Precaution Comments: per Dr. Mayer Camel note 10/24- essentially a girdlestone on right with PWB_no % noted, previous posterior hip precaautions, 2 hemavacs on right hip Restrictions RLE Weight Bearing: Partial weight bearing    Mobility  Bed Mobility Overal bed mobility: Needs Assistance   Rolling: +2 for safety/equipment Sidelying to sit: Total assist Supine to sit: Total assist Sit to supine: Total assist   General bed mobility comments: patient required increased assistance for moving to sitting. Listing heavily to the left. Noted significant edema of the left thigh which may contribute to the lean. Patient pushing backwards also. Never  able to achieve near midline today.  Transfers                    Ambulation/Gait                 Stairs             Wheelchair Mobility    Modified Rankin (Stroke Patients Only)       Balance   Sitting-balance support: Feet supported;Bilateral upper extremity supported Sitting balance-Leahy Scale: Zero Sitting balance - Comments: total assist for sitting.                                    Cognition Arousal/Alertness: Awake/alert Behavior During Therapy: Flat affect Overall Cognitive Status: Difficult to assess Area of Impairment: Problem solving;Safety/judgement                     Memory: Decreased recall of precautions;Decreased short-term memory                Exercises      General Comments        Pertinent Vitals/Pain Faces Pain Scale: Hurts whole lot Pain Location: right hip when moving Pain Descriptors / Indicators: Discomfort;Grimacing Pain Intervention(s): Monitored during session;Premedicated before session;Repositioned    Home Living                      Prior Function            PT Goals (current goals  can now be found in the care plan section) Progress towards PT goals: Progressing toward goals    Frequency    Min 2X/week      PT Plan Current plan remains appropriate    Co-evaluation              AM-PAC PT "6 Clicks" Daily Activity  Outcome Measure  Difficulty turning over in bed (including adjusting bedclothes, sheets and blankets)?: Unable Difficulty moving from lying on back to sitting on the side of the bed? : Unable Difficulty sitting down on and standing up from a chair with arms (e.g., wheelchair, bedside commode, etc,.)?: Unable Help needed moving to and from a bed to chair (including a wheelchair)?: Total Help needed walking in hospital room?: Total Help needed climbing 3-5 steps with a railing? : Total 6 Click Score: 6    End of Session   Activity  Tolerance: Patient limited by fatigue Patient left: in bed Nurse Communication: Mobility status;Need for lift equipment PT Visit Diagnosis: Unsteadiness on feet (R26.81) Pain - Right/Left: Right Pain - part of body: Hip     Time: 7544-9201 PT Time Calculation (min) (ACUTE ONLY): 17 min  Charges:  $Gait Training: 8-22 mins                     [place] Pager [phone] Office [phone]    Claretha Cooper 06/10/2018, 3:24 PM

## 2018-06-11 DIAGNOSIS — R21 Rash and other nonspecific skin eruption: Secondary | ICD-10-CM

## 2018-06-11 DIAGNOSIS — Z978 Presence of other specified devices: Secondary | ICD-10-CM

## 2018-06-11 LAB — CREATININE, SERUM
Creatinine, Ser: 1 mg/dL (ref 0.44–1.00)
GFR calc Af Amer: >60 mL/min (ref >60)
GFR calc non Af Amer: 53 mL/min — ABNORMAL LOW (ref >60)

## 2018-06-11 LAB — VANCOMYCIN, TROUGH: VANCOMYCIN TR: 16 ug/mL (ref 15–20)

## 2018-06-11 NOTE — Progress Notes (Signed)
Nutrition Follow-up  INTERVENTION:   -Continue Ensure Enlive po BID, each supplement provides 350 kcal and 20 grams of protein -Recommend new weight measurement (last recorded 10/21).  NUTRITION DIAGNOSIS:   Inadequate oral intake related to inability to eat as evidenced by NPO status.  Now on soft diet but still with inadequate oral intake.  GOAL:   Patient will meet greater than or equal to 90% of their needs  Not meeting.  MONITOR:   Diet advancement, PO intake, Supplement acceptance, Weight trends, Labs, Skin  ASSESSMENT:   77 year-old with PMH of arthritis, avascular necrosis and fracture of R femur head, cellulitis of shoulder, glaucoma, HTN, polycythemia vera, rhabdomyolysis, thrombocytosis. She presented on 10/21 with infected R total hip arthroplasty and on that date underwent revision, hardware removal, and vac placement. She returned to the OR on 10/22 for evacuation of hematoma at the site.   Patient now on soft diet, not consuming much of her meals. Ate a little breakfast this morning but refused meals yesterday. Pt is drinking Ensures supplements, at least 2/day.   No new weights measured, last recorded 10/21.   Labs reviewed. Medications: Vitamin C tablet daily  Diet Order:   Diet Order            DIET SOFT Room service appropriate? Yes; Fluid consistency: Thin  Diet effective now              EDUCATION NEEDS:   No education needs have been identified at this time  Skin:  Skin Assessment: Skin Integrity Issues: Skin Integrity Issues:: Incisions, Wound VAC, Stage II Stage II: buttocks Wound Vac: R hip Incisions: R hip (10/21)  Last BM:  10/25  Height:   Ht Readings from Last 1 Encounters:  06/03/18 5\' 4"  (1.626 m)    Weight:   Wt Readings from Last 1 Encounters:  06/03/18 55.8 kg    Ideal Body Weight:  54.54 kg  BMI:  Body mass index is 21.11 kg/m.  Estimated Nutritional Needs:   Kcal:  1675-1840 (30-33 kcal/kg)  Protein:   72-84 grams (1.3-1.5 grams/kg)  Fluid:  >/= 1.7 L/day  Clayton Bibles, MS, RD, LDN [place] Dietitian Pager: [phone] After Hours Pager: [phone]

## 2018-06-11 NOTE — Progress Notes (Signed)
Physical Therapy Treatment Patient Details Name: Alejandra [name] MRN: [id number] DOB: [birth year] Today's Date: 06/11/2018    History of Present Illness Alejandra [name] 77 y.o. female with hx of Polycythemia vera, and previous right hip hemiarthroplasty in 2013 with multiple surgeries. Admitted 10/21 from SNF   for recurrent right THA infection. Radical irrigation and debridement of infected right total hip with removal of all hardware, placement of acetabular spacer and wound VAC. On  early AM 06/04/18 returned to surgery  emergently for evacuation of hematoma. H/O glaucoma    PT Comments    Assisted with side to side rolling to place MAXI MOVE Pad under pt with care and cation to R hip.  Positioned to comfort multiple pillows.     Follow Up Recommendations  SNF     Equipment Recommendations  None recommended by PT    Recommendations for Other Services       Precautions / Restrictions Precautions Precautions: Posterior Hip;Fall Precaution Comments: per Dr. Mayer Camel note 10/24- essentially a girdlestone on right with PWB_no % noted, previous posterior hip precaautions, 2 hemavacs on right hip Restrictions Weight Bearing Restrictions: Yes RLE Weight Bearing: Partial weight bearing    Mobility  Bed Mobility Overal bed mobility: Needs Assistance Bed Mobility: Rolling Rolling: +2 for safety/equipment(pt 5% )         General bed mobility comments: performed side to side rolling to place Maxi Move under pt.  Required increased time to position to comfort once in recliner.    Transfers Overall transfer level: Needs assistance               General transfer comment: + 2 assist using MAXI MOVE with increased time/caution R hip  Ambulation/Gait                 Stairs             Wheelchair Mobility    Modified Rankin (Stroke Patients Only)       Balance                                            Cognition                                               Exercises      General Comments        Pertinent Vitals/Pain      Home Living                      Prior Function            PT Goals (current goals can now be found in the care plan section) Progress towards PT goals: Progressing toward goals    Frequency    Min 2X/week      PT Plan Current plan remains appropriate    Co-evaluation              AM-PAC PT "6 Clicks" Daily Activity  Outcome Measure  Difficulty turning over in bed (including adjusting bedclothes, sheets and blankets)?: Unable Difficulty moving from lying on back to sitting on the side of the bed? : Unable Difficulty sitting down on and standing up from a chair with arms (e.g., wheelchair, bedside  commode, etc,.)?: Unable Help needed moving to and from a bed to chair (including a wheelchair)?: Total Help needed walking in hospital room?: Total Help needed climbing 3-5 steps with a railing? : Total 6 Click Score: 6    End of Session Equipment Utilized During Treatment: Gait belt Activity Tolerance: Patient tolerated treatment well Patient left: in chair;with family/visitor present;with call bell/phone within reach Nurse Communication: Mobility status;Need for lift equipment PT Visit Diagnosis: Unsteadiness on feet (R26.81) Pain - Right/Left: Right Pain - part of body: Hip     Time: 8472-0721 PT Time Calculation (min) (ACUTE ONLY): 26 min  Charges:  $Therapeutic Activity: 23-37 mins                     Rica Koyanagi  PTA Acute  Rehabilitation Services Pager      [phone] Office      [phone]

## 2018-06-11 NOTE — Consult Note (Signed)
   [facility] CM Inpatient Consult   06/11/2018  MACKINZIE VUNCANNON 08/11/1941 [id number]     Patient screened for potential [facility] Care Management services due to unplanned readmission risk score of 27% (high) and multiple hospital readmissions.  Chart reviewed. Noted Mrs. Keel is from SNF and likely to return there. No identifiable [facility] Care Management needs noted at this current time.   Marthenia Rolling, MSN-Ed, RN,BSN [facility] Liaison [phone]

## 2018-06-11 NOTE — Progress Notes (Signed)
INFECTIOUS DISEASE PROGRESS NOTE  ID: Alejandra [name] is a 77 y.o. female with  Active Problems:   Infection of prosthetic total hip joint ([place])   Pressure injury of skin  Subjective: Without complaints Has had itching, on her back today  Abtx:  Anti-infectives (From admission, onward)   Start     Dose/Rate Route Frequency Ordered Stop   06/07/18 1400  fluconazole (DIFLUCAN) IVPB 400 mg     400 mg 100 mL/hr over 120 Minutes Intravenous Every 24 hours 06/07/18 0856     06/07/18 1200  vancomycin (VANCOCIN) IVPB 750 mg/150 ml premix     750 mg 150 mL/hr over 60 Minutes Intravenous Every 24 hours 06/07/18 0856     06/07/18 1000  cefTAZidime (FORTAZ) 2 g in sodium chloride 0.9 % 100 mL IVPB     2 g 200 mL/hr over 30 Minutes Intravenous Every 12 hours 06/07/18 0856     06/07/18 0800  amoxicillin-clavulanate (AUGMENTIN) 500-125 MG per tablet 500 mg  Status:  Discontinued     1 tablet Oral 2 times daily 06/03/18 2224 06/05/18 1620   06/06/18 1600  fluconazole (DIFLUCAN) IVPB 200 mg  Status:  Discontinued     200 mg 100 mL/hr over 60 Minutes Intravenous Every 24 hours 06/06/18 1000 06/07/18 0856   06/06/18 1000  vancomycin (VANCOCIN) IVPB 750 mg/150 ml premix  Status:  Discontinued     750 mg 150 mL/hr over 60 Minutes Intravenous Every 36 hours 06/05/18 1114 06/07/18 0856   06/06/18 1000  cefTAZidime (FORTAZ) 2 g in sodium chloride 0.9 % 100 mL IVPB  Status:  Discontinued     2 g 200 mL/hr over 30 Minutes Intravenous Every 24 hours 06/05/18 1149 06/07/18 0856   06/05/18 1700  fluconazole (DIFLUCAN) IVPB 400 mg  Status:  Discontinued     400 mg 100 mL/hr over 120 Minutes Intravenous Every 24 hours 06/05/18 1620 06/06/18 1000   06/04/18 2000  vancomycin (VANCOCIN) IVPB 750 mg/150 ml premix  Status:  Discontinued     750 mg 150 mL/hr over 60 Minutes Intravenous Every 24 hours 06/04/18 1906 06/05/18 1114   06/04/18 0713  vancomycin variable dose per unstable renal function (pharmacist  dosing)  Status:  Discontinued      Does not apply See admin instructions 06/04/18 0714 06/04/18 1916   06/03/18 2330  ceftAZIDime (FORTAZ) IVPB 2 g  Status:  Discontinued     2 g 100 mL/hr over 30 Minutes Intravenous 2 times daily 06/03/18 2224 06/03/18 2321   06/03/18 2330  cefTAZidime (FORTAZ) 2 g in sodium chloride 0.9 % 100 mL IVPB  Status:  Discontinued     2 g 200 mL/hr over 30 Minutes Intravenous Every 12 hours 06/03/18 2322 06/05/18 1149   06/03/18 2230  vancomycin IVPB  Status:  Discontinued     750 mg Intravenous Every 24 hours 06/03/18 2224 06/03/18 2323   06/03/18 1710  tobramycin (NEBCIN) powder  Status:  Discontinued       As needed 06/03/18 1812 06/03/18 1812   06/03/18 1710  vancomycin (VANCOCIN) powder  Status:  Discontinued       As needed 06/03/18 1812 06/03/18 1812   06/03/18 1530  cefTAZidime (FORTAZ) 2 g in sodium chloride 0.9 % 100 mL IVPB     2 g 200 mL/hr over 30 Minutes Intravenous  Once 06/03/18 1521 06/03/18 1629   06/03/18 1530  vancomycin (VANCOCIN) IVPB 750 mg/150 ml premix     750 mg  150 mL/hr over 60 Minutes Intravenous  Once 06/03/18 1521 06/03/18 1650      Medications:  Scheduled: . amLODipine  10 mg Oral Daily  . anagrelide  1 mg Oral BID  . Chlorhexidine Gluconate Cloth  6 each Topical Daily  . docusate sodium  100 mg Oral BID  . feeding supplement (ENSURE ENLIVE)  237 mL Oral TID BM  . gabapentin  300 mg Oral BID  . latanoprost  1 drop Both Eyes QHS  . mouth rinse  15 mL Mouth Rinse BID  . pantoprazole  40 mg Oral Daily  . rosuvastatin  5 mg Oral QPM  . sodium chloride flush  10 mL Intravenous Q12H  . sodium chloride flush  10-40 mL Intracatheter Q12H  . vitamin C  500 mg Oral Daily    Objective: Vital signs in last 24 hours: Temp:  [98.4 F (36.9 C)-99 F (37.2 C)] 98.9 F (37.2 C) (10/29 0606) Pulse Rate:  [61-115] 61 (10/29 0606) Resp:  [14] 14 (10/28 1349) BP: (119-129)/(66-74) 119/66 (10/29 0606) SpO2:  [78 %-98 %] 78 %  (10/29 0606)   General appearance: alert, cooperative and no distress Resp: clear to auscultation bilaterally Cardio: regular rate and rhythm GI: normal findings: bowel sounds normal and soft, non-tender Extremities: drains in place. RUE PIC new dressing clean.  Skin: erythema on anterior, upper chest.   Lab Results Recent Labs    06/09/18 0313 06/10/18 0338 06/11/18 0518  WBC 38.0*  --   --   HGB 11.2*  --   --   HCT 37.7  --   --   NA 138  --   --   K 4.7  --   --   CL 112*  --   --   CO2 20*  --   --   BUN 12  --   --   CREATININE 0.80 0.85 1.00   Liver Panel No results for input(s): PROT, ALBUMIN, AST, ALT, ALKPHOS, BILITOT, BILIDIR, IBILI in the last 72 hours. Sedimentation Rate No results for input(s): ESRSEDRATE in the last 72 hours. C-Reactive Protein No results for input(s): CRP in the last 72 hours.  Microbiology: Recent Results (from the past 240 hour(s))  Aerobic/Anaerobic Culture (surgical/deep wound)     Status: None (Preliminary result)   Collection Time: 06/03/18  4:25 PM  Result Value Ref Range Status   Specimen Description   Final    WOUND RIGHT HIP Performed at [facility], [place] [address] [place], [place] [address]    Special Requests   Final    NONE Performed at [facility], [place] [address], [place], [place] [address]    Gram Stain   Final    RARE WBC PRESENT, PREDOMINANTLY PMN NO ORGANISMS SEEN    Culture   Final    FEW CANDIDA TROPICALIS Sent to [place] for further susceptibility testing. NO ANAEROBES ISOLATED Performed at [facility], [place] [address], [place], [place] [address]    Report Status PENDING  Incomplete  Aerobic/Anaerobic Culture (surgical/deep wound)     Status: None   Collection Time: 06/03/18  4:35 PM  Result Value Ref Range Status   Specimen Description   Final    TISSUE RIGHT HIP Performed at [place] [address] [place], [place] [address]     Special Requests   Final    NONE Performed at [facility], [place] [address] [place], [place] [address]  Gram Stain   Final    RARE WBC PRESENT, PREDOMINANTLY PMN RARE GRAM NEGATIVE RODS    Culture   Final    No growth aerobically or anaerobically. Performed at [facility], [place] [address], [place], [place] [address]    Report Status 06/08/2018 FINAL  Final  MRSA PCR Screening     Status: None   Collection Time: 06/05/18 12:13 PM  Result Value Ref Range Status   MRSA by PCR NEGATIVE NEGATIVE Final    Comment:        The GeneXpert MRSA Assay (FDA approved for NASAL specimens only), is one component of a comprehensive MRSA colonization surveillance program. It is not intended to diagnose MRSA infection nor to guide or monitor treatment for MRSA infections. Performed at [facility], [place] [address] [place], [place] [address]     Studies/Results: No results found.   Assessment/Plan: Wound Infection- C tropicalis, GNR Prosthetic Joint Resection P vera Rash  Total days of antibiotics:71flucon, 39ceftaz-vanco  Will watch her rash- augmentin is possible etiology.  ceftaz seems less likely given how long she has been on it.  flucon is possible as well.          Bobby Rumpf MD, FACP Infectious Diseases (pager) [phone] [email or web address] 06/11/2018, 10:39 AM  LOS: 8 days

## 2018-06-11 NOTE — Progress Notes (Signed)
Pharmacy Antibiotic Note  Alejandra [name] is a 77 y.o. female admitted on 06/03/2018 with recurrent THA infection.  Pharmacy has been consulted for Vancomcyin dosing.  D6 Fluconazole, D39 Vanc/Ceftazidime  Vancomycin AUC = 526 which is near goal of 400-500.   Vancomycin trough of 16 was drawn ~30 minutes early, actual trough calculates to 15.4  SCr 0.8 > 0.85 > 1.0  Plan:  Continue Vancomycin 750 mg IV q24h  Continue Ceftazidime 2g IV q12h  Continue Fluconazole 400 mg IV q24  Recheck vancomycin levels weekly, sooner if renal function changes  See pended OPAT orders   Height: 5\' 4"  (162.6 cm) Weight: 123 lb (55.8 kg) IBW/kg (Calculated) : 54.7  Temp (24hrs), Avg:98.8 F (37.1 C), Min:98.4 F (36.9 C), Max:99 F (37.2 C)  Recent Labs  Lab 06/04/18 1649  06/05/18 0558 06/06/18 0529 06/07/18 0527 06/09/18 0313 06/10/18 0338 06/10/18 1436 06/11/18 0518 06/11/18 1105  WBC  --   --  39.2* 33.8* 38.6* 38.0*  --   --   --   --   CREATININE  --    < > 1.53* 1.27* 0.83 0.80 0.85  --  1.00  --   VANCOTROUGH  --   --   --   --   --   --   --   --   --  16  VANCOPEAK  --   --   --   --   --   --   --  29*  --   --   VANCORANDOM 8  --   --   --   --   --   --   --   --   --    < > = values in this interval not displayed.    Estimated Creatinine Clearance: 40 mL/min (by C-G formula based on SCr of 1 mg/dL).    No Known Allergies  Antimicrobials this admission: Outpatient Vanc and Ceftazidime 9/11 >> 10/18  Inpatient:  10/21 >> Ceftazidime >> 10/21 >> Vancomycin >> 10/23 >> Fluconazole >>   Drug level/ Dose adjustments this admission: 10/22 Vanc random level = 8 mcg/mL, obtained 24 hours after initial dose.  Continue prior dose of vancomycin 750mg  q24h 10/23 SCr increased, reduce frequency of vancomycin and ceftazidime. 10/24 fluconazole 400>200 mg IV q24 10/25 Incr vanc to 750 q24, Incr Ceftaz to 2 gm q12, Incr fluconazole to 400 q24 10/28 Vanc Peak @ 14:36 = 29  (dose at 12:24), 10/29 VT @ 11:05 = 16 , AUC = 526  Microbiology results: 10/23 MRSA PCR: neg 10/21 Tissue (right hip@ 1625):  gram stain no organisms; Cx few candida tropicalis  10/21 Tissue (right hip@ 1635):  gram stain GNR; Cx NGF  Thank you for allowing pharmacy to be a part of this patient's care.  Peggyann Juba, PharmD, BCPS Pager: [phone] 06/11/2018 12:29 PM

## 2018-06-11 NOTE — Progress Notes (Signed)
[name]  MRN: [id number]  DOB/AGE:  Nov 10, 1940 / 77 y.o.  7 Days Post-Op Procedure(s) (LRB): WOUND EXPLORATION evacuation hematoma (Right)    PROGRESS NOTE Subjective: Patient is alert, oriented, no Nausea, no Vomiting, yes passing gas, . Taking PO well. Denies SOB, Chest or Calf Pain. Using Incentive Spirometer, PAS in place. Ambulate bed to chair Patient reports [id number] as  5/10  .    Objective: Vital signs in last 24 hours: Vitals:   06/09/18 2202 06/10/18 1349 06/10/18 2119 06/11/18 0606  BP: 118/75 120/72 129/74 119/66  Pulse: (Abnormal) 117 (Abnormal) 115 (Abnormal) 109 61  Resp: 18 14    Temp: 98.2 F (36.8 C) 99 F (37.2 C) 98.4 F (36.9 C) 98.9 F (37.2 C)  TempSrc: Oral  Oral Oral  SpO2: 100% 97% 98% (Abnormal) 78%  Weight:      Height:          Intake/Output from previous day: I/O last 3 completed shifts: In: 2363.3 [P.O.:340; I.V.:1594.9; IV Piggyback:428.4] Out: 1330 [Urine:1250; Drains:80]   Intake/Output this shift: No intake/output data recorded.   LABORATORY DATA: Recent Labs    06/09/18 0313 06/10/18 0338 06/11/18 0518  WBC 38.0*  --   --   HGB 11.2*  --   --   HCT 37.7  --   --   PLT 429*  --   --   NA 138  --   --   K 4.7  --   --   CL 112*  --   --   CO2 20*  --   --   BUN 12  --   --   CREATININE 0.80 0.85 1.00  GLUCOSE 129*  --   --   CALCIUM 7.9*  --   --     Examination: Neurologically intact ABD soft Neurovascular intact Sensation intact distally Intact pulses distally Dorsiflexion/Plantar flexion intact Incision: scant drainage No cellulitis present Compartment soft} Dressing was taken down minimal dried drainage.  The 2 superficial drains were removed and the deep drains left in place.  Total drainage recorded over the last 24 hours was 40 cc yesterday it was 240 cc.  Assessment:   7 Days Post-Op Procedure(s) (LRB): WOUND EXPLORATION evacuation hematoma (Right) ADDITIONAL DIAGNOSIS:  Expected Acute Blood  Loss Anemia, tri-clonal polycythemia vera.  Deep wound infection cultures only growing some Candida albicans and one gram-negative was noted on Gram stain but no growth.  Is presently on high-dose IV antibiotics.  Plan: PT/OT WBAT, THA  DVT Prophylaxis: None secondary to bleeding from polycythemia vera  Will monitor drainage the next 24 hours and possibly pull the deep drains at that time.  We will also monitor the wound and the dressing.   DISCHARGE PLAN: Skilled Nursing Facility/Rehab, patient will permanently have very limited ambulatory capacity and will be primarily bed to chair.  DISCHARGE NEEDS: HHPT, [place] and 3-in-1 comode seatPatient ID: Alejandra [name], female   DOB: June 06, 1941, 77 y.o.   MRN: [id number]

## 2018-06-12 LAB — MISC LABCORP TEST (SEND OUT): Labcorp test code: 182220

## 2018-06-12 LAB — CREATININE, SERUM
CREATININE: 1.07 mg/dL — AB (ref 0.44–1.00)
GFR calc Af Amer: 56 mL/min — ABNORMAL LOW (ref >60)
GFR calc non Af Amer: 48 mL/min — ABNORMAL LOW (ref >60)

## 2018-06-12 MED ORDER — BRIMONIDINE TARTRATE 0.2 % OP SOLN
1.0000 [drp] | Freq: Two times a day (BID) | OPHTHALMIC | Status: DC
  Administered 2018-06-12 – 2018-06-18 (×11): 1 [drp] via OPHTHALMIC
  Filled 2018-06-12 (×2): qty 5

## 2018-06-12 MED ORDER — TIMOLOL MALEATE 0.5 % OP SOLN
1.0000 [drp] | Freq: Two times a day (BID) | OPHTHALMIC | Status: DC
  Administered 2018-06-12 – 2018-06-18 (×11): 1 [drp] via OPHTHALMIC
  Filled 2018-06-12: qty 5

## 2018-06-12 MED ORDER — BRINZOLAMIDE 1 % OP SUSP
1.0000 [drp] | Freq: Two times a day (BID) | OPHTHALMIC | Status: DC
  Administered 2018-06-12 – 2018-06-18 (×11): 1 [drp] via OPHTHALMIC
  Filled 2018-06-12 (×2): qty 10

## 2018-06-12 MED ORDER — SODIUM CHLORIDE 0.9 % IV SOLN: 100.0000 mg | INTRAVENOUS | Status: DC

## 2018-06-12 MED ORDER — SODIUM CHLORIDE 0.9 % IV SOLN
100.0000 mg | INTRAVENOUS | Status: DC
  Administered 2018-06-14 – 2018-06-19 (×6): 100 mg via INTRAVENOUS
  Filled 2018-06-12 (×6): qty 100

## 2018-06-12 MED ORDER — TRAMADOL HCL 50 MG PO TABS: 50.0000 mg | ORAL_TABLET | Freq: Four times a day (QID) | ORAL | Status: DC | PRN

## 2018-06-12 MED ORDER — LATANOPROSTENE BUNOD 0.024 % OP SOLN
1.0000 [drp] | Freq: Every day | OPHTHALMIC | Status: DC
  Administered 2018-06-12: 1 [drp] via OPHTHALMIC
  Filled 2018-06-12: qty 1

## 2018-06-12 MED ORDER — SODIUM CHLORIDE 0.9 % IV SOLN
200.0000 mg | Freq: Once | INTRAVENOUS | Status: DC
  Filled 2018-06-12: qty 200

## 2018-06-12 MED ORDER — SODIUM CHLORIDE 0.9 % IV SOLN
200.0000 mg | Freq: Once | INTRAVENOUS | Status: AC
  Administered 2018-06-13: 200 mg via INTRAVENOUS
  Filled 2018-06-12: qty 200

## 2018-06-12 NOTE — Progress Notes (Signed)
Patient has been a little more confused and has been hallucinating and paranoid. Patient has refused to eat and take some medications. It seemed like it got worse after I gave her pain medication as she was stating that she was hurting.  Pa was made aware and said he would D/C Iv meds.

## 2018-06-12 NOTE — Progress Notes (Addendum)
INFECTIOUS DISEASE PROGRESS NOTE  ID: Alejandra [name] is a 77 y.o. female with  Active Problems:   Infection of prosthetic total hip joint ([place])   Pressure injury of skin  Subjective: Received dilaudid an hour ago, unclear speech.   Abtx:  Anti-infectives (From admission, onward)   Start     Dose/Rate Route Frequency Ordered Stop   06/07/18 1400  fluconazole (DIFLUCAN) IVPB 400 mg     400 mg 100 mL/hr over 120 Minutes Intravenous Every 24 hours 06/07/18 0856     06/07/18 1200  vancomycin (VANCOCIN) IVPB 750 mg/150 ml premix     750 mg 150 mL/hr over 60 Minutes Intravenous Every 24 hours 06/07/18 0856     06/07/18 1000  cefTAZidime (FORTAZ) 2 g in sodium chloride 0.9 % 100 mL IVPB     2 g 200 mL/hr over 30 Minutes Intravenous Every 12 hours 06/07/18 0856     06/07/18 0800  amoxicillin-clavulanate (AUGMENTIN) 500-125 MG per tablet 500 mg  Status:  Discontinued     1 tablet Oral 2 times daily 06/03/18 2224 06/05/18 1620   06/06/18 1600  fluconazole (DIFLUCAN) IVPB 200 mg  Status:  Discontinued     200 mg 100 mL/hr over 60 Minutes Intravenous Every 24 hours 06/06/18 1000 06/07/18 0856   06/06/18 1000  vancomycin (VANCOCIN) IVPB 750 mg/150 ml premix  Status:  Discontinued     750 mg 150 mL/hr over 60 Minutes Intravenous Every 36 hours 06/05/18 1114 06/07/18 0856   06/06/18 1000  cefTAZidime (FORTAZ) 2 g in sodium chloride 0.9 % 100 mL IVPB  Status:  Discontinued     2 g 200 mL/hr over 30 Minutes Intravenous Every 24 hours 06/05/18 1149 06/07/18 0856   06/05/18 1700  fluconazole (DIFLUCAN) IVPB 400 mg  Status:  Discontinued     400 mg 100 mL/hr over 120 Minutes Intravenous Every 24 hours 06/05/18 1620 06/06/18 1000   06/04/18 2000  vancomycin (VANCOCIN) IVPB 750 mg/150 ml premix  Status:  Discontinued     750 mg 150 mL/hr over 60 Minutes Intravenous Every 24 hours 06/04/18 1906 06/05/18 1114   06/04/18 0713  vancomycin variable dose per unstable renal function (pharmacist dosing)   Status:  Discontinued      Does not apply See admin instructions 06/04/18 0714 06/04/18 1916   06/03/18 2330  ceftAZIDime (FORTAZ) IVPB 2 g  Status:  Discontinued     2 g 100 mL/hr over 30 Minutes Intravenous 2 times daily 06/03/18 2224 06/03/18 2321   06/03/18 2330  cefTAZidime (FORTAZ) 2 g in sodium chloride 0.9 % 100 mL IVPB  Status:  Discontinued     2 g 200 mL/hr over 30 Minutes Intravenous Every 12 hours 06/03/18 2322 06/05/18 1149   06/03/18 2230  vancomycin IVPB  Status:  Discontinued     750 mg Intravenous Every 24 hours 06/03/18 2224 06/03/18 2323   06/03/18 1710  tobramycin (NEBCIN) powder  Status:  Discontinued       As needed 06/03/18 1812 06/03/18 1812   06/03/18 1710  vancomycin (VANCOCIN) powder  Status:  Discontinued       As needed 06/03/18 1812 06/03/18 1812   06/03/18 1530  cefTAZidime (FORTAZ) 2 g in sodium chloride 0.9 % 100 mL IVPB     2 g 200 mL/hr over 30 Minutes Intravenous  Once 06/03/18 1521 06/03/18 1629   06/03/18 1530  vancomycin (VANCOCIN) IVPB 750 mg/150 ml premix     750 mg 150  mL/hr over 60 Minutes Intravenous  Once 06/03/18 1521 06/03/18 1650      Medications:  Scheduled: . amLODipine  10 mg Oral Daily  . anagrelide  1 mg Oral BID  . brinzolamide  1 drop Both Eyes BID   And  . brimonidine  1 drop Both Eyes BID  . Chlorhexidine Gluconate Cloth  6 each Topical Daily  . docusate sodium  100 mg Oral BID  . feeding supplement (ENSURE ENLIVE)  237 mL Oral TID BM  . gabapentin  300 mg Oral BID  . Latanoprostene Bunod  1 drop Ophthalmic QHS  . mouth rinse  15 mL Mouth Rinse BID  . pantoprazole  40 mg Oral Daily  . rosuvastatin  5 mg Oral QPM  . sodium chloride flush  10 mL Intravenous Q12H  . sodium chloride flush  10-40 mL Intracatheter Q12H  . timolol  1 drop Both Eyes BID  . vitamin C  500 mg Oral Daily    Objective: Vital signs in last 24 hours: Temp:  [98.1 F (36.7 C)-99.2 F (37.3 C)] 98.1 F (36.7 C) (10/30 1445) Pulse Rate:   [109-125] 125 (10/30 1445) Resp:  [16] 16 (10/30 1445) BP: (107-116)/(64-75) 116/70 (10/30 1445) SpO2:  [99 %] 99 % (10/30 1445) Weight:  [71.1 kg] 71.1 kg (10/30 0500)   General appearance: no distress Resp: clear to auscultation bilaterally Chest wall: erythema on anterior, upper chest more dense.  Cardio: regular rate and rhythm GI: normal findings: bowel sounds normal and soft, non-tender  Lab Results Recent Labs    06/11/18 0518 06/12/18 0316  CREATININE 1.00 1.07*   Liver Panel No results for input(s): PROT, ALBUMIN, AST, ALT, ALKPHOS, BILITOT, BILIDIR, IBILI in the last 72 hours. Sedimentation Rate No results for input(s): ESRSEDRATE in the last 72 hours. C-Reactive Protein No results for input(s): CRP in the last 72 hours.  Microbiology: Recent Results (from the past 240 hour(s))  Aerobic/Anaerobic Culture (surgical/deep wound)     Status: None (Preliminary result)   Collection Time: 06/03/18  4:25 PM  Result Value Ref Range Status   Specimen Description   Final    WOUND RIGHT HIP Performed at [facility], [place] [address] [place], [place] [address]    Special Requests   Final    NONE Performed at [facility], [place] [address], [place], [place] [address]    Gram Stain   Final    RARE WBC PRESENT, PREDOMINANTLY PMN NO ORGANISMS SEEN    Culture   Final    FEW CANDIDA TROPICALIS Sent to [facility] for further susceptibility testing. NO ANAEROBES ISOLATED Performed at [facility], [place] [address] [place], [place] [address]    Report Status PENDING  Incomplete  Aerobic/Anaerobic Culture (surgical/deep wound)     Status: None   Collection Time: 06/03/18  4:35 PM  Result Value Ref Range Status   Specimen Description   Final    TISSUE RIGHT HIP Performed at [place] [address] [place], [place] [address]    Special Requests   Final    NONE Performed at [facility] [place] [address], [place], [place] [address]    Gram Stain   Final    RARE WBC PRESENT, PREDOMINANTLY PMN RARE GRAM NEGATIVE RODS    Culture   Final    No growth aerobically or anaerobically. Performed at [facility], [place] [address] [place],  [address]  Report Status 06/08/2018 FINAL  Final  MRSA PCR Screening     Status: None   Collection Time: 06/05/18 12:13 PM  Result Value Ref Range Status   MRSA by PCR NEGATIVE NEGATIVE Final    Comment:        The GeneXpert MRSA Assay (FDA approved for NASAL specimens only), is one component of a comprehensive MRSA colonization surveillance program. It is not intended to diagnose MRSA infection nor to guide or monitor treatment for MRSA infections. Performed at [facility], [place] [address] [place], [place] [address]     Studies/Results: No results found.   Assessment/Plan: Wound Infection- C tropicalis, GNR Prosthetic Joint Resection P vera Rash  Total days of antibiotics:29flucon, 40ceftaz-vanco      Will change her flucon to eraxis to try to ameliorate her rash.  Would make the most sense chronologically Discussed with pharm, she did not receive augmentin in hospital.  Will continue to watch.      Bobby Rumpf MD, FACP Infectious Diseases (pager) [phone] [email or web address] 06/12/2018, 4:02 PM  LOS: 9 days

## 2018-06-12 NOTE — Plan of Care (Signed)
  Problem: Clinical Measurements: Goal: Ability to maintain clinical measurements within normal limits will improve Outcome: Not Progressing   Problem: Clinical Measurements: Goal: Will remain free from infection Outcome: Not Progressing   Problem: Clinical Measurements: Goal: Diagnostic test results will improve Outcome: Not Progressing   Problem: Nutrition: Goal: Adequate nutrition will be maintained Outcome: Not Progressing   Problem: Coping: Goal: Level of anxiety will decrease Outcome: Not Progressing

## 2018-06-12 NOTE — Progress Notes (Signed)
[name]  MRN: [id number]  DOB/AGE:  78-01-1941 / 77 y.o.  8 Days Post-Op Procedure(s) (LRB): WOUND EXPLORATION evacuation hematoma (Right)    PROGRESS NOTE Subjective: Patient is alert, oriented, no Nausea, no Vomiting, yes passing gas, . Taking PO well. Denies SOB, Chest or Calf Pain. Using Incentive Spirometer, PAS in place. Ambulate bed to chair. Patient reports pain as  4/10.Superficial drain was removed yesterday and night nurse reports that she had to change the drain at 7:30 PM yesterday and again at 5 this morning for serous drainage out of the old drain port as well as proximally on the wound.  The drainage had no odor to it was relatively clear, but did soak through a couple of 4 x 4's.   Objective: Vital signs in last 24 hours: Vitals:   06/11/18 1335 06/11/18 2230 06/12/18 0500 06/12/18 0638  BP: 123/76 109/64  115/66  Pulse: (Abnormal) 120 (Abnormal) 115  (Abnormal) 109  Resp:  16  16  Temp: 97.7 F (36.5 C) 99.2 F (37.3 C)    TempSrc: Oral Oral    SpO2: 93%     Weight:   71.1 kg   Height:          Intake/Output from previous day: I/O last 3 completed shifts: In: 1855 [P.O.:180; I.V.:360; Other:10; IV Piggyback:500] Out: 765 [Urine:750; Drains:15]   Intake/Output this shift: No intake/output data recorded.   LABORATORY DATA: Recent Labs    06/11/18 0518 06/12/18 0316  CREATININE 1.00 1.07*    Examination: I took down the superior aspect of the dressing.  The drain holes were the superficial drains were still had a little bit of serous drainage coming out and the 2 hour interval since last change.  The area superiorly on the wound did not have any drainage from 5 AM until when examined the patient.  Assessment:   8 Days Post-Op Procedure(s) (LRB): WOUND EXPLORATION evacuation hematoma (Right) ADDITIONAL DIAGNOSIS:  Try clonal polycythemia vera that has made Postoperative wound management quite difficult.  During the brief time she had a Vac  she had significant bleeding.  She is done much better with 2 deep drains and 2 superficial drains.  The superficial drains were pulled yesterday and she is had some serous drainage. Plan:  Goal is to get patient back to a skilled nursing facility.  After pulling the superficial drains.  Yesterday, she has had some serous drainage.  I am hesitant to pull the deep drains at this time and we will probably watch the wound 1 more day with the deep drains in place. We will check CBC and be met tomorrow morning.  Hopefully be able to pull the deep drains tomorrow.

## 2018-06-12 NOTE — Discharge Planning (Signed)
Updated clinicals submitted to [facility] for the insurance approval process.   Kathrin Greathouse, Marlinda Mike, MSW Clinical Social Worker  [phone] 06/12/2018  10:13 AM

## 2018-06-13 ENCOUNTER — Inpatient Hospital Stay (HOSPITAL_COMMUNITY): Payer: Medicare HMO

## 2018-06-13 ENCOUNTER — Inpatient Hospital Stay: Payer: Medicare HMO

## 2018-06-13 ENCOUNTER — Inpatient Hospital Stay: Payer: Medicare HMO | Admitting: Oncology

## 2018-06-13 DIAGNOSIS — I10 Essential (primary) hypertension: Secondary | ICD-10-CM

## 2018-06-13 DIAGNOSIS — R410 Disorientation, unspecified: Secondary | ICD-10-CM

## 2018-06-13 DIAGNOSIS — R0989 Other specified symptoms and signs involving the circulatory and respiratory systems: Secondary | ICD-10-CM

## 2018-06-13 LAB — BASIC METABOLIC PANEL
ANION GAP: 6 (ref 5–15)
ANION GAP: 6 (ref 5–15)
BUN: 24 mg/dL — ABNORMAL HIGH (ref 8–23)
BUN: 27 mg/dL — ABNORMAL HIGH (ref 8–23)
CALCIUM: 7.7 mg/dL — AB (ref 8.9–10.3)
CHLORIDE: 111 mmol/L (ref 98–111)
CO2: 17 mmol/L — AB (ref 22–32)
CO2: 17 mmol/L — ABNORMAL LOW (ref 22–32)
CREATININE: 1.9 mg/dL — AB (ref 0.44–1.00)
Calcium: 7.8 mg/dL — ABNORMAL LOW (ref 8.9–10.3)
Chloride: 112 mmol/L — ABNORMAL HIGH (ref 98–111)
Creatinine, Ser: 1.63 mg/dL — ABNORMAL HIGH (ref 0.44–1.00)
GFR calc non Af Amer: 29 mL/min — ABNORMAL LOW (ref >60)
GFR calc non Af Amer: 24 mL/min — ABNORMAL LOW (ref >60)
GFR, EST AFRICAN AMERICAN: 34 mL/min — AB (ref >60)
GFR, EST AFRICAN AMERICAN: 28 mL/min — AB (ref >60)
Glucose, Bld: 97 mg/dL (ref 70–99)
Glucose, Bld: 107 mg/dL — ABNORMAL HIGH (ref 70–99)
Potassium: 5.2 mmol/L — ABNORMAL HIGH (ref 3.5–5.1)
Potassium: 5.2 mmol/L — ABNORMAL HIGH (ref 3.5–5.1)
SODIUM: 135 mmol/L (ref 135–145)
Sodium: 134 mmol/L — ABNORMAL LOW (ref 135–145)

## 2018-06-13 LAB — CBC WITH DIFFERENTIAL/PLATELET
ABS IMMATURE GRANULOCYTES: 1.02 10*3/uL — AB (ref 0.00–0.07)
Basophils Absolute: 0.2 10*3/uL — ABNORMAL HIGH (ref 0.0–0.1)
Basophils Relative: 1 %
Eosinophils Absolute: 0.8 10*3/uL — ABNORMAL HIGH (ref 0.0–0.5)
Eosinophils Relative: 2 %
HEMATOCRIT: 37.2 % (ref 36.0–46.0)
HEMOGLOBIN: 11.4 g/dL — AB (ref 12.0–15.0)
Immature Granulocytes: 2 %
LYMPHS PCT: 2 %
Lymphs Abs: 0.7 10*3/uL (ref 0.7–4.0)
MCH: 26 pg (ref 26.0–34.0)
MCHC: 30.6 g/dL (ref 30.0–36.0)
MCV: 84.7 fL (ref 80.0–100.0)
MONO ABS: 1.6 10*3/uL — AB (ref 0.1–1.0)
MONOS PCT: 3 %
NEUTROS ABS: 42.6 10*3/uL — AB (ref 1.7–7.7)
Neutrophils Relative %: 90 %
Platelets: 302 10*3/uL (ref 150–400)
RBC: 4.39 MIL/uL (ref 3.87–5.11)
RDW: 21.7 % — ABNORMAL HIGH (ref 11.5–15.5)
WBC: 46.9 10*3/uL — ABNORMAL HIGH (ref 4.0–10.5)
nRBC: 0 % (ref 0.0–0.2)

## 2018-06-13 LAB — VANCOMYCIN, RANDOM: Vancomycin Rm: 23

## 2018-06-13 MED ORDER — SODIUM CHLORIDE 0.9 % IV SOLN
2.0000 g | INTRAVENOUS | Status: DC
  Administered 2018-06-14 – 2018-06-17 (×4): 2 g via INTRAVENOUS
  Filled 2018-06-13 (×4): qty 2

## 2018-06-13 MED ORDER — VANCOMYCIN VARIABLE DOSE PER UNSTABLE RENAL FUNCTION (PHARMACIST DOSING): Status: DC

## 2018-06-13 MED ORDER — FUROSEMIDE 10 MG/ML IJ SOLN
40.0000 mg | Freq: Once | INTRAMUSCULAR | Status: AC
  Administered 2018-06-13: 40 mg via INTRAVENOUS
  Filled 2018-06-13: qty 4

## 2018-06-13 MED ORDER — GABAPENTIN 300 MG PO CAPS
300.0000 mg | ORAL_CAPSULE | Freq: Every day | ORAL | Status: DC
  Administered 2018-06-14 – 2018-06-18 (×5): 300 mg via ORAL
  Filled 2018-06-13 (×5): qty 1

## 2018-06-13 NOTE — Progress Notes (Signed)
[name]  MRN: [id number]  DOB/AGE:  10-21-1940 / 77 y.o.  9 Days Post-Op Procedure(s) (LRB): WOUND EXPLORATION evacuation hematoma (Right)    PROGRESS NOTE Subjective: Patient is alert, oriented, no Nausea, no Vomiting, yes passing gas, . Taking PO poor apetite. Denies SOB, Chest or Calf Pain. Using Incentive Spirometer, PAS in place. Ambulate bed to chair Patient reports pain as  2/10  .    Objective: Vital signs in last 24 hours: Vitals:   06/13/18 0114 06/13/18 0116 06/13/18 0130 06/13/18 0501  BP: (Abnormal) 75/58 (Abnormal) 113/57 (Abnormal) 113/52 117/75  Pulse: (Abnormal) 117 (Abnormal) 115 (Abnormal) 112 (Abnormal) 120  Resp: 16   14  Temp: 98 F (36.7 C)   98.2 F (36.8 C)  TempSrc: Oral   Oral  SpO2: 100%   93%  Weight:      Height:          Intake/Output from previous day: I/O last 3 completed shifts: In: 1958 [P.O.:120; I.V.:1478; Other:10; IV Piggyback:350] Out: 905 [Urine:900; Drains:5]   Intake/Output this shift: No intake/output data recorded.   LABORATORY DATA: Recent Labs    06/12/18 0316 06/13/18 0432  WBC  --  46.9*  HGB  --  11.4*  HCT  --  37.2  PLT  --  302  NA  --  134*  K  --  5.2*  CL  --  111  CO2  --  17*  BUN  --  24*  CREATININE 1.07* 1.63*  GLUCOSE  --  97  CALCIUM  --  7.8*    Examination: Neurologically intact ABD soft Neurovascular intact Sensation intact distally Intact pulses distally Dorsiflexion/Plantar flexion intact Incision: scant drainage No cellulitis present Compartment soft} Drainage yesterday and today was a total of 5 cc.  The deep drains were pulled.  I did do a dressing change there is minimal serous drainage from the posterior aspect of the wound on the dressing.  Patient still has discomfort when you move the resected hip joint.  Assessment:   9 Days Post-Op Procedure(s) (LRB): WOUND EXPLORATION evacuation hematoma (Right) ADDITIONAL DIAGNOSIS:  Expected Acute Blood Loss Anemia,  tri-clonal polycythemia vera with bleeding disorder, stage II sacral decubitus ulcer, acute renal failure.  Plan: Drainage output yesterday was 0 cc today 5 cc of the deep drains were pulled.  Will contact hospitalist service for treatment of acute renal failure her urine output yesterday was 800 cc that she is on multiple antibiotics.  DVT Prophylaxis: None because of bleeding disorder DISCHARGE PLAN: Skilled Nursing Facility/Rehab  DISCHARGE NEEDS: HHPT, Walker and 3-in-1 comode seat, IV antibiotics   [name], female   DOB: [birth year], 77 y.o.   MRN: [id number]

## 2018-06-13 NOTE — Consult Note (Signed)
Medical Consultation   Alejandra [name]  WUX:[id number]  DOB: 1940/12/18  DOA: 06/03/2018  PCP: Vincente [place], MD  Requesting physician: Frederik Pear, MD  Reason for consultation: Renal failure  History of Present Illness: Alejandra [name] is an 77 y.o. female with a history of avascular necrosis s/p total hip arthroplasty, failed hip arthroplasty, hypertension, polycythemia vera. Patient presented secondary to infected hip joint. She has underwent I&D with removal of hip hardware. Her course has been complicated by acute blood loss anemia requiring 5 units of PRBC and s/p 1 unit of platelets, hip infection currently on antibiotics/antifungal treatment, and now acute kidney injury. Today, on BMP, patient found to have an elevated creatinine of 1.63. Baseline is <1. Previously had an AKI with a peak creatinine of 1.53 on 10/23 which resolved with IV fluids and was likely secondary to hypovolemia from blood loss. Currently, patient has poor oral intake with decreased urine output. She was given IV fluids this morning for hypotension. She is not on any ACEi/ARB for hypertension.      Review of Systems:  Review of Systems  Constitutional: Negative for chills and fever.  Respiratory: Negative for cough, sputum production, shortness of breath and wheezing.   Cardiovascular: Negative for chest pain and palpitations.  Gastrointestinal: Negative for abdominal pain, nausea and vomiting.  Musculoskeletal: Positive for joint pain (Right hip).  All other systems reviewed and are negative.    Past Medical History: Past Medical History:  Diagnosis Date  . Arthritis   . Avascular necrosis of femur head, right ([place]) 04/23/2012  . Cellulitis of shoulder 09/28/2014  . Fracture 10/05/11   "fell and broke right hip"  . Fracture of femoral neck, right ([place]) 10/12/2011  . Glaucoma   . Glaucoma    bilateral  . Hypertension   . Polycythemia vera(238.4)   . Postoperative wound  dehiscence   . Rhabdomyolysis 09/28/2014  . Thrombocytosis ([place]) 04/23/2012    Past Surgical History: Past Surgical History:  Procedure Laterality Date  . APPLICATION OF WOUND VAC Right 06/03/2018   Procedure: APPLICATION OF WOUND VAC;  Surgeon: Frederik Pear, MD;  Location: [facility];  Service: Orthopedics;  Laterality: Right;  . COLONOSCOPY    . HARDWARE REMOVAL  04/23/2012   Procedure: HARDWARE REMOVAL;  Surgeon: Johnny Bridge, MD;  Location: [facility];  Service: Orthopedics;  Laterality: Right;  . HARDWARE REMOVAL Right 06/03/2018   Procedure: RIGHT HIP HARDWARE REMOVAL;  Surgeon: Frederik Pear, MD;  Location: [facility];  Service: Orthopedics;  Laterality: Right;  . HIP ARTHROPLASTY  04/23/2012   Procedure: ARTHROPLASTY BIPOLAR HIP;  Surgeon: Johnny Bridge, MD;  Location: [facility];  Service: Orthopedics;  Laterality: Right;  . HIP PINNING,CANNULATED  10/12/2011   Procedure: CANNULATED HIP PINNING;  Surgeon: Johnny Bridge, MD;  Location: [facility];  Service: Orthopedics;  Laterality: Right;  . I&D EXTREMITY Right 04/24/2018   Procedure: IRRIGATION AND DEBRIDEMENT EXTREMITY RIGHT HIP WITH POLY EXCHANGE POSTERIOR LATERAL POSITION,stem and head exchange;  Surgeon: Frederik Pear, MD;  Location: [facility];  Service: Orthopedics;  Laterality: Right;  . INCISION AND DRAINAGE HIP Right 06/03/2018   Procedure: IRRIGATION AND DEBRIDEMENT OF RIGHT HIP;  Surgeon: Frederik Pear, MD;  Location: [facility];  Service: Orthopedics;  Laterality: Right;  . TOTAL HIP ARTHROPLASTY WITH HARDWARE REMOVAL Right 06/03/2018   Procedure: RIGHT HIP RESECTION AND IMPLANTATION OF CEMENT SPACER;  Surgeon: Frederik Pear, MD;  Location: [facility];  Service: Orthopedics;  Laterality: Right;  . TOTAL HIP REVISION Right 02/04/2018   Procedure: RIGHT TOTAL HIP REVISION: REVISION OF RIGHT MONOPOLAR/CONVERSION TO TOTAL HIP;  Surgeon: Frederik Pear, MD;  Location: [facility];  Service: Orthopedics;  Laterality: Right;  Requesting 2 hours  . WOUND EXPLORATION  Right 06/04/2018   Procedure: WOUND EXPLORATION evacuation hematoma;  Surgeon: Frederik Pear, MD;  Location: [facility];  Service: Orthopedics;  Laterality: Right;     Allergies:  No Known Allergies   Social History:  reports that she quit smoking about 26 years ago. Her smoking use included cigarettes. She smoked 0.12 packs per day. She has never used smokeless tobacco. She reports that she does not drink alcohol or use drugs.   Family History: Family History  Problem Relation Age of Onset  . Cancer Mother   . Hypertension Father   . Asthma Sister     Physical Exam: Vitals:   06/13/18 0114 06/13/18 0116 06/13/18 0130 06/13/18 0501  BP: (!) 75/58 (!) 113/57 (!) 113/52 117/75  Pulse: (!) 117 (!) 115 (!) 112 (!) 120  Resp: 16   14  Temp: 98 F (36.7 C)   98.2 F (36.8 C)  TempSrc: Oral   Oral  SpO2: 100%   93%  Weight:      Height:        Physical Exam  Constitutional: She appears well-developed and well-nourished. No distress.  HENT:  Mouth/Throat: Oropharynx is clear and moist.  Eyes: Pupils are equal, round, and reactive to light. Conjunctivae and EOM are normal.  Neck: Normal range of motion.  Cardiovascular: Regular rhythm, normal heart sounds and normal pulses. Tachycardia present.  No murmur heard. Pulmonary/Chest: Effort normal. No respiratory distress. She has no decreased breath sounds. She has no wheezes. She has rales in the left lower field.  Abdominal: Soft. Bowel sounds are normal. She exhibits no distension. There is no tenderness. There is no rebound and no guarding.  Musculoskeletal: Normal range of motion. She exhibits no tenderness.       Right lower leg: She exhibits edema (2+).       Left lower leg: She exhibits edema (2+).  Lymphadenopathy:    She has no cervical adenopathy.  Neurological: She is alert. She is disoriented (oriented to person and place).  Skin: Skin is warm and dry. She is not diaphoretic.  Psychiatric: She has a normal mood and  affect. Thought content normal. Her speech is delayed. She is slowed.     Data reviewed:  I have personally reviewed following labs and imaging studies Labs:  CBC: Recent Labs  Lab 06/07/18 0527 06/09/18 0313 06/13/18 0432  WBC 38.6* 38.0* 46.9*  NEUTROABS  --  33.2* 42.6*  HGB 11.0* 11.2* 11.4*  HCT 35.5* 37.7 37.2  MCV 85.3 86.9 84.7  PLT 522* 429* 272    Basic Metabolic Panel: Recent Labs  Lab 06/07/18 0527 06/09/18 0313 06/10/18 0338 06/11/18 0518 06/12/18 0316 06/13/18 0432  NA 140 138  --   --   --  134*  K 4.3 4.7  --   --   --  5.2*  CL 114* 112*  --   --   --  111  CO2 20* 20*  --   --   --  17*  GLUCOSE 135* 129*  --   --   --  97  BUN 14 12  --   --   --  24*  CREATININE 0.83 0.80  0.85 1.00 1.07* 1.63*  CALCIUM 7.9* 7.9*  --   --   --  7.8*   GFR Estimated Creatinine Clearance: 27.5 mL/min (A) (by C-G formula based on SCr of 1.63 mg/dL (H)). Liver Function Tests: No results for input(s): AST, ALT, ALKPHOS, BILITOT, PROT, ALBUMIN in the last 168 hours. No results for input(s): LIPASE, AMYLASE in the last 168 hours. No results for input(s): AMMONIA in the last 168 hours. Coagulation profile Recent Labs  Lab 06/06/18 1823  INR 1.32    Cardiac Enzymes: No results for input(s): CKTOTAL, CKMB, CKMBINDEX, TROPONINI in the last 168 hours. BNP: Invalid input(s): POCBNP CBG: No results for input(s): GLUCAP in the last 168 hours. D-Dimer No results for input(s): DDIMER in the last 72 hours. Hgb A1c No results for input(s): HGBA1C in the last 72 hours. Lipid Profile No results for input(s): CHOL, HDL, LDLCALC, TRIG, CHOLHDL, LDLDIRECT in the last 72 hours. Thyroid function studies No results for input(s): TSH, T4TOTAL, T3FREE, THYROIDAB in the last 72 hours.  Invalid input(s): FREET3 Anemia work up No results for input(s): VITAMINB12, FOLATE, FERRITIN, TIBC, IRON, RETICCTPCT in the last 72 hours. Urinalysis    Component Value Date/Time    COLORURINE YELLOW 06/03/2018 [place] 06/03/2018 1315   LABSPEC 1.015 06/03/2018 1315   LABSPEC 1.020 08/29/2013 0928   PHURINE 6.0 06/03/2018 1315   GLUCOSEU NEGATIVE 06/03/2018 1315   GLUCOSEU Negative 08/29/2013 0928   HGBUR NEGATIVE 06/03/2018 1315   BILIRUBINUR NEGATIVE 06/03/2018 1315   BILIRUBINUR Negative 08/29/2013 0928   KETONESUR NEGATIVE 06/03/2018 1315   PROTEINUR 30 (A) 06/03/2018 1315   UROBILINOGEN 1.0 09/27/2014 2259   UROBILINOGEN 0.2 08/29/2013 0928   NITRITE NEGATIVE 06/03/2018 1315   LEUKOCYTESUR NEGATIVE 06/03/2018 1315   LEUKOCYTESUR Negative 08/29/2013 0928     Microbiology Recent Results (from the past 240 hour(s))  Aerobic/Anaerobic Culture (surgical/deep wound)     Status: None (Preliminary result)   Collection Time: 06/03/18  4:25 PM  Result Value Ref Range Status   Specimen Description   Final    WOUND RIGHT HIP Performed at [facility], [place] [address] [place], [place] [address]    Special Requests   Final    NONE Performed at [facility], [place] [address], [place], [place] [address]    Gram Stain   Final    RARE WBC PRESENT, PREDOMINANTLY PMN NO ORGANISMS SEEN    Culture   Final    FEW CANDIDA TROPICALIS Sent to [place] for further susceptibility testing. NO ANAEROBES ISOLATED Performed at [facility], [place] [address], [place], [place] [address]    Report Status PENDING  Incomplete  Aerobic/Anaerobic Culture (surgical/deep wound)     Status: None   Collection Time: 06/03/18  4:35 PM  Result Value Ref Range Status   Specimen Description   Final    TISSUE RIGHT HIP Performed at [place] [address] [place], [place] [address]    Special Requests   Final    NONE Performed at [facility], [place] [address] [place], [place] [address]    Gram Stain   Final    RARE WBC PRESENT, PREDOMINANTLY PMN RARE GRAM NEGATIVE RODS    Culture    Final    No growth aerobically or anaerobically. Performed at [facility], [place] [address] [place],  [address]    Report Status 06/08/2018 FINAL  Final  MRSA PCR Screening  Status: None   Collection Time: 06/05/18 12:13 PM  Result Value Ref Range Status   MRSA by PCR NEGATIVE NEGATIVE Final    Comment:        The GeneXpert MRSA Assay (FDA approved for NASAL specimens only), is one component of a comprehensive MRSA colonization surveillance program. It is not intended to diagnose MRSA infection nor to guide or monitor treatment for MRSA infections. Performed at [facility], [place] [address], [place], [place] [address]        Inpatient Medications:   Scheduled Meds: . amLODipine  10 mg Oral Daily  . anagrelide  1 mg Oral BID  . brinzolamide  1 drop Both Eyes BID   And  . brimonidine  1 drop Both Eyes BID  . Chlorhexidine Gluconate Cloth  6 each Topical Daily  . docusate sodium  100 mg Oral BID  . feeding supplement (ENSURE ENLIVE)  237 mL Oral TID BM  . gabapentin  300 mg Oral BID  . Latanoprostene Bunod  1 drop Ophthalmic QHS  . mouth rinse  15 mL Mouth Rinse BID  . pantoprazole  40 mg Oral Daily  . rosuvastatin  5 mg Oral QPM  . sodium chloride flush  10 mL Intravenous Q12H  . sodium chloride flush  10-40 mL Intracatheter Q12H  . timolol  1 drop Both Eyes BID  . vitamin C  500 mg Oral Daily   Continuous Infusions: . sodium chloride Stopped (06/08/18 1226)  . anidulafungin     Followed by  . [START ON 06/14/2018] anidulafungin    . cefTAZidime (FORTAZ)  IV 2 g (06/12/18 2225)  . dextrose 5 % and 0.45 % NaCl with KCl 20 mEq/L 30 mL/hr at 06/10/18 1540  . vancomycin 750 mg (06/12/18 1405)     Radiological Exams on Admission: No results found.  Impression/Recommendations Active Problems:   Infection of prosthetic total hip joint (HCC)   Pressure injury of skin  Acute kidney injury Patient with an episode of hypotension  overnight which likely precipitated injury. Patient, however, has significant LE edema and rales on exam. Albumin from 1 month ago is 3. IV fluids initially at 13ml/hr which was reduced to about 30 ml/hr a few days ago. Started at 126ml/hr this morning secondary to hypotension. UOP of 900 mL in the last 24 hours. She is positive 11.6 liters (few episodes of unmeasured occurrences). Somewhat difficult to assess overall fluid status. Vancomycin trough within normal range. -Chest x-ray -Hold amlodipine -Will give a trial of Lasix and recheck BMP this afternoon  Hyperkalemia In setting of potassium containing IV fluids and acute kidney injury. -Treat above problem and stop IV fluids -Recheck in AM  Tachycardia Patient has been consistently tachycardic during hospitalization. Likely in part secondary to hypovolemia from significant blood loss, chronic infection, etc. No EKG available, but sounds regular on exam. 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 Infectious disease on board. -ID recommendations: Currently on Eraxis and ceftaz/vancomycin  Polycythemia vera Follows with Dr. Benay Spice as an outpatient. Leukocytosis. Platelets improved with Agrylin. -Oncology management  Essential hypertension -hold amlodipine  Acute blood loss anemia In setting of recent surgery. S/p 5 units of PRBC. Currently stable.  Pain On oxycodone and gabapentin -Decrease to gabapentin 300 mg daily secondary  to worsening renal function   Thank you for this consultation.  Our [facility] hospitalist team will follow the patient with you.    Cordelia Poche M.D. [facility] 06/13/2018, 9:01 AM

## 2018-06-13 NOTE — Progress Notes (Addendum)
Pharmacy Antibiotic Note  Alejandra [name] is a 77 y.o. female admitted on 06/03/2018 with recurrent THA infection.  Pharmacy has been consulted for Vancomcyin dosing.  D8 total antifungal and now D0 Eraxis, D41 Vanc/Ceftazidime  SCr 0.8 > 0.85 > 1.0 > 1.07 > 1.63  WBC 46.9 rising  Afebrile  Vanc random level 23 at 1100  Plan:  Random vanc level 23 at 1100 with last dose of vanc 750mg  given 10/30 at 1400. Level supratherapeutic (goal trough 15-20) and with SCr rising, will continue to hold vanc for now and recheck vanc level and SCr in AM  See pended OPAT orders but will likely need to change those orders once new vanc dosing known  Change ceftazidime from 2g IV q12 to 2g IV q24 for Crcl < 30 ml/min   Height: 5\' 4"  (162.6 cm) Weight: 156 lb 12 oz (71.1 kg) IBW/kg (Calculated) : 54.7  Temp (24hrs), Avg:98.3 F (36.8 C), Min:98 F (36.7 C), Max:98.9 F (37.2 C)  Recent Labs  Lab 06/07/18 0527 06/09/18 0313 06/10/18 0338 06/10/18 1436 06/11/18 0518 06/11/18 1105 06/12/18 0316 06/13/18 0432 06/13/18 1122  WBC 38.6* 38.0*  --   --   --   --   --  46.9*  --   CREATININE 0.83 0.80 0.85  --  1.00  --  1.07* 1.63*  --   VANCOTROUGH  --   --   --   --   --  16  --   --   --   VANCOPEAK  --   --   --  29*  --   --   --   --   --   VANCORANDOM  --   --   --   --   --   --   --   --  23    Estimated Creatinine Clearance: 27.5 mL/min (A) (by C-G formula based on SCr of 1.63 mg/dL (H)).    No Known Allergies  Antimicrobials this admission: Outpatient Vanc and Ceftazidime 9/11 >> 10/18  Inpatient:  10/21 >> Ceftazidime >> 10/21 >> Vancomycin >> 10/23 >> Fluconazole >> 10/31 10/31 Eraxis >>   Drug level/ Dose adjustments this admission: 10/22 Vanc random level = 8 mcg/mL, obtained 24 hours after initial dose.  Continue prior dose of vancomycin 750mg  q24h 10/23 SCr increased, reduce frequency of vancomycin and ceftazidime. 10/24 fluconazole 400>200 mg IV q24 10/25 Incr  vanc to 750 q24, Incr Ceftaz to 2 gm q12, Incr fluconazole to 400 q24 10/28 Vanc Peak @ 14:36 = 29 (dose at 12:24), 10/29 VT @ 11:05 = 16 , AUC = 526 10/31 Vanc Random 23 - continue to hold vanc  Microbiology results: 10/23 MRSA PCR: neg 10/21 Tissue (right hip@ 1625):  gram stain no organisms; Cx few candida tropicalis  10/21 Tissue (right hip@ 1635):  gram stain GNR; Cx NGF  Thank you for allowing pharmacy to be a part of this patient's care.  Adrian Saran, PharmD, BCPS Pager [phone] 06/13/2018 12:24 PM

## 2018-06-13 NOTE — Care Management Important Message (Signed)
Important Message  Patient Details  Name: Alejandra [name] MRN: [id number] Date of Birth: 09-02-1940   Medicare Important Message Given:  Yes    Kerin Salen 06/13/2018, 2:00 [place] Message  Patient Details  Name: Alejandra [name] MRN: [id number] Date of Birth: Dec 19, 1940   Medicare Important Message Given:  Yes    Kerin Salen 06/13/2018, 2:00 PM

## 2018-06-13 NOTE — Progress Notes (Signed)
Physical Therapy Treatment Patient Details Name: Alejandra [name] MRN: [id number] DOB: 27-Apr-1941 Today's Date: 06/13/2018    History of Present Illness Alejandra [name] 77 y.o. female with hx of Polycythemia vera, and previous right hip hemiarthroplasty in 2013 with multiple surgeries. Admitted 10/21 from SNF   for recurrent right THA infection. Radical irrigation and debridement of infected right total hip with removal of all hardware, placement of acetabular spacer and wound VAC. On  early AM 06/04/18 returned to surgery  emergently for evacuation of hematoma. H/O glaucoma    PT Comments    RN reports pt sleeping a lot and not eating much.  Pt responded to name briefly.  Pt present with poor tracking and brief moments of awake mode but very groggy/delayed response.  Attempted to increase alertness with stimulation/repositioning however remained mostly asleep. Pt unable to hold a cup of water to drink.  Pt also appears more edematous throughout. Noted watery/slight bloody drainage on R hip dressing(reported to RN).  Too weak to attempt sitting to EOB used MAXI MOVE LIFT to to recliner and positioned as upright as possible.     Follow Up Recommendations  SNF     Equipment Recommendations  None recommended by PT    Recommendations for Other Services       Precautions / Restrictions Precautions Precautions: Posterior Hip;Fall Precaution Comments: per Dr. [place] Camel note 10/24- essentially a girdlestone on right with PWB_no % noted, previous posterior hip precaautions, 2 hemavacs on right hip Restrictions Weight Bearing Restrictions: Yes RLE Weight Bearing: Partial weight bearing    Mobility  Bed Mobility Overal bed mobility: Needs Assistance Bed Mobility: Rolling Rolling: +2 for safety/equipment;Max assist;Total assist(pt 0%)         General bed mobility comments: performed side to side rolling to place Maxi Move under pt.  Required increased time to position to comfort once in recliner.   Pt 0%  Transfers Overall transfer level: Needs assistance Equipment used: Rolling walker (2 wheeled)             General transfer comment: + 2 assist using MAXI MOVE with increased time/caution R hip  Ambulation/Gait                 Stairs             Wheelchair Mobility    Modified Rankin (Stroke Patients Only)       Balance                                            Cognition Arousal/Alertness: Lethargic Behavior During Therapy: Flat affect                                   General Comments: patient appears more sleepy/lathergic and present with poor tracking.  spoke only a few words.  repeated      Exercises      General Comments        Pertinent Vitals/Pain Pain Assessment: Faces Faces Pain Scale: Hurts little more Pain Location: right hip when moving Pain Descriptors / Indicators: Grimacing Pain Intervention(s): Monitored during session;Repositioned    Home Living                      Prior Function  PT Goals (current goals can now be found in the care plan section) Progress towards PT goals: Progressing toward goals    Frequency    Min 2X/week      PT Plan Current plan remains appropriate    Co-evaluation              AM-PAC PT "6 Clicks" Daily Activity  Outcome Measure  Difficulty turning over in bed (including adjusting bedclothes, sheets and blankets)?: Unable Difficulty moving from lying on back to sitting on the side of the bed? : Unable Difficulty sitting down on and standing up from a chair with arms (e.g., wheelchair, bedside commode, etc,.)?: Unable Help needed moving to and from a bed to chair (including a wheelchair)?: Total Help needed walking in hospital room?: Total Help needed climbing 3-5 steps with a railing? : Total 6 Click Score: 6    End of Session     Patient left: in chair Nurse Communication: Need for lift equipment PT Visit  Diagnosis: Unsteadiness on feet (R26.81) Pain - Right/Left: Right Pain - part of body: Hip     Time: 8676-1950 PT Time Calculation (min) (ACUTE ONLY): 25 min  Charges:  $Therapeutic Activity: 23-37 mins                     Rica Koyanagi  PTA Acute  Rehabilitation Services Pager      [phone] Office      [phone]

## 2018-06-13 NOTE — Progress Notes (Signed)
Discussed with Dr. Lonny Prude pt's poor oral intake, refusing to eat most of the time, and refusing PO meds last night. Reported that pt just wants to sleep. Urine concentrated and pt tachycardic with rales in lungs. Chest xray ordered. Will encourage PO intake and continue to monitor.

## 2018-06-13 NOTE — Progress Notes (Signed)
[name], female   DOB: 10/23/1940, 77 y.o.   MRN: [id number]         [facility] Infectious Disease  Date of Admission:  06/03/2018           Day 49 vancomycin        Day 49 ceftazidime        Day 9 antifungal therapy ASSESSMENT: She has a complicated right prosthetic hip infection.  Operative cultures grew "skin flora" in September.  She had persistent problems despite a long course of IV vancomycin and ceftazidime and was readmitted.  Hardware was removed and a spacer was placed.  Operative Gram stain showed gram-negative rods cultures have grown Candida tropicalis so far.  She developed a rash while on fluconazole and was changed to anidulafungin yesterday.  PLAN: 1. Continue current antibiotics but I will consider stopping IV vancomycin soon  Active Problems:   Infection of prosthetic total hip joint (HCC)   Pressure injury of skin   Scheduled Meds: . anagrelide  1 mg Oral BID  . brinzolamide  1 drop Both Eyes BID   And  . brimonidine  1 drop Both Eyes BID  . Chlorhexidine Gluconate Cloth  6 each Topical Daily  . docusate sodium  100 mg Oral BID  . feeding supplement (ENSURE ENLIVE)  237 mL Oral TID BM  . [START ON 06/14/2018] gabapentin  300 mg Oral Daily  . Latanoprostene Bunod  1 drop Ophthalmic QHS  . mouth rinse  15 mL Mouth Rinse BID  . pantoprazole  40 mg Oral Daily  . rosuvastatin  5 mg Oral QPM  . sodium chloride flush  10 mL Intravenous Q12H  . sodium chloride flush  10-40 mL Intracatheter Q12H  . timolol  1 drop Both Eyes BID  . vancomycin variable dose per unstable renal function (pharmacist dosing)   Does not apply See admin instructions  . vitamin C  500 mg Oral Daily   Continuous Infusions: . sodium chloride 250 mL (06/13/18 0946)  . [START ON 06/14/2018] anidulafungin    . [START ON 06/14/2018] cefTAZidime (FORTAZ)  IV     PRN Meds:.sodium chloride, acetaminophen, ALPRAZolam, alum & mag hydroxide-simeth, bisacodyl,  diphenhydrAMINE, loratadine, meclizine, menthol-cetylpyridinium **OR** phenol, methocarbamol **OR** [DISCONTINUED] methocarbamol (ROBAXIN) IV, metoCLOPramide **OR** metoCLOPramide (REGLAN) injection, ondansetron **OR** ondansetron (ZOFRAN) IV, oxyCODONE, polyethylene glycol, sodium chloride flush, sodium phosphate, traMADol  Review of Systems: Review of Systems  Unable to perform ROS: Mental acuity    No Known Allergies  OBJECTIVE: Vitals:   06/13/18 0116 06/13/18 0130 06/13/18 0501 06/13/18 1557  BP: (!) 113/57 (!) 113/52 117/75 112/65  Pulse: (!) 115 (!) 112 (!) 120 (!) 122  Resp:   14   Temp:   98.2 F (36.8 C) 98.1 F (36.7 C)  TempSrc:   Oral Oral  SpO2:   93% 99%  Weight:      Height:       Body mass index is 26.91 kg/m.  Physical Exam  Constitutional:  She is sitting up in a chair trying to feed herself dinner.  She appears somewhat confused.  Musculoskeletal:  5 cc of right hip drainage recorded yesterday.  Neurological: She is alert.  Skin:  She still has some erythema and early desquamation on her anterior chest.    Lab Results Lab Results  Component Value Date   WBC 46.9 (H) 06/13/2018   HGB 11.4 (L) 06/13/2018   HCT 37.2 06/13/2018   MCV 84.7 06/13/2018  PLT 302 06/13/2018    Lab Results  Component Value Date   CREATININE 1.63 (H) 06/13/2018   BUN 24 (H) 06/13/2018   NA 134 (L) 06/13/2018   K 5.2 (H) 06/13/2018   CL 111 06/13/2018   CO2 17 (L) 06/13/2018    Lab Results  Component Value Date   ALT 22 04/23/2018   AST 25 04/23/2018   ALKPHOS 128 (H) 04/23/2018   BILITOT 0.8 04/23/2018     Microbiology: Recent Results (from the past 240 hour(s))  MRSA PCR Screening     Status: None   Collection Time: 06/05/18 12:13 PM  Result Value Ref Range Status   MRSA by PCR NEGATIVE NEGATIVE Final    Comment:        The GeneXpert MRSA Assay (FDA approved for NASAL specimens only), is one component of a comprehensive MRSA colonization surveillance  program. It is not intended to diagnose MRSA infection nor to guide or monitor treatment for MRSA infections. Performed at [facility], [place] [address], [place], [place] [address]     Michel Bickers, [place] [facility] [phone] pager   [phone] cell 06/13/2018, 5:37 PM

## 2018-06-14 DIAGNOSIS — N179 Acute kidney failure, unspecified: Secondary | ICD-10-CM

## 2018-06-14 DIAGNOSIS — T8459XA Infection and inflammatory reaction due to other internal joint prosthesis, initial encounter: Secondary | ICD-10-CM

## 2018-06-14 DIAGNOSIS — Z96649 Presence of unspecified artificial hip joint: Secondary | ICD-10-CM

## 2018-06-14 LAB — CREATININE, SERUM
Creatinine, Ser: 1.89 mg/dL — ABNORMAL HIGH (ref 0.44–1.00)
GFR calc non Af Amer: 24 mL/min — ABNORMAL LOW (ref >60)
GFR, EST AFRICAN AMERICAN: 28 mL/min — AB (ref >60)

## 2018-06-14 LAB — BASIC METABOLIC PANEL
ANION GAP: 5 (ref 5–15)
BUN: 29 mg/dL — ABNORMAL HIGH (ref 8–23)
CO2: 18 mmol/L — ABNORMAL LOW (ref 22–32)
Calcium: 7.6 mg/dL — ABNORMAL LOW (ref 8.9–10.3)
Chloride: 111 mmol/L (ref 98–111)
Creatinine, Ser: 1.87 mg/dL — ABNORMAL HIGH (ref 0.44–1.00)
GFR calc Af Amer: 29 mL/min — ABNORMAL LOW (ref >60)
GFR, EST NON AFRICAN AMERICAN: 25 mL/min — AB (ref >60)
Glucose, Bld: 100 mg/dL — ABNORMAL HIGH (ref 70–99)
POTASSIUM: 4.9 mmol/L (ref 3.5–5.1)
SODIUM: 134 mmol/L — AB (ref 135–145)

## 2018-06-14 LAB — VANCOMYCIN, RANDOM: VANCOMYCIN RM: 21

## 2018-06-14 MED ORDER — LIP MEDEX EX OINT
TOPICAL_OINTMENT | CUTANEOUS | Status: AC
  Administered 2018-06-14: 12:00:00
  Filled 2018-06-14: qty 7

## 2018-06-14 NOTE — Progress Notes (Signed)
CSW received notice from [facility] representative that patient will need infectious disease to document how long a course of antibiotics the patient will receive. This is required for [facility] authorization.  Stephanie Acre, [place] Social Worker [phone]

## 2018-06-14 NOTE — Progress Notes (Signed)
[name]  MRN: [id number]  DOB/AGE:  Feb 03, 1941 / 77 y.o.  10 Days Post-Op Procedure(s) (LRB): WOUND EXPLORATION evacuation hematoma (Right)    PROGRESS NOTE Subjective: Patient is alert, oriented, no Nausea, no Vomiting, yes passing gas. Poor appetite and patient reports pain is minimal, if she does not move the lower extremity.  Night nurse reports that there was serous drainage that one through the dressing overnight.  This is the first night, with all of the drains out. Objective: Vital signs in last 24 hours: Vitals:   06/13/18 0501 06/13/18 1557 06/13/18 2209 06/14/18 0549  BP: 117/75 112/65 104/63 114/62  Pulse: (Abnormal) 120 (Abnormal) 122 (Abnormal) 108 (Abnormal) 109  Resp: 14  15 16   Temp: 98.2 F (36.8 C) 98.1 F (36.7 C) 98.3 F (36.8 C) (Abnormal) 97.4 F (36.3 C)  TempSrc: Oral Oral Oral Oral  SpO2: 93% 99% 100% 99%  Weight:      Height:          Intake/Output from previous day: I/O last 3 completed shifts: In: 120 [P.O.:120] Out: 850 [Urine:850]   Intake/Output this shift: No intake/output data recorded.   LABORATORY DATA: Recent Labs    06/13/18 0432 06/13/18 2005 06/14/18 0529  WBC 46.9*  --   --   HGB 11.4*  --   --   HCT 37.2  --   --   PLT 302  --   --   NA 134* 135  --   K 5.2* 5.2*  --   CL 111 112*  --   CO2 17* 17*  --   BUN 24* 27*  --   CREATININE 1.63* 1.90* 1.89*  GLUCOSE 97 107*  --   CALCIUM 7.8* 7.7*  --     Examination: Neurologically intact ABD soft Neurovascular intact Sensation intact distally Intact pulses distally Dorsiflexion/Plantar flexion intact Incision: moderate drainage No cellulitis present Compartment soft} XR AP&Lat of hip shows well placed\fixed THA  Assessment:   10 Days Post-Op Procedure(s) (LRB): WOUND EXPLORATION evacuation hematoma (Right) ADDITIONAL DIAGNOSIS:  Expected Acute Blood Loss Anemia, Polycythemia vera, stage II sacral decubitus ulcer  Plan: If drainage persists,   consider MRI scan of the right hip, however, I am not sure she would tolerate another large operation for irrigation and debridement.Marland Kitchen

## 2018-06-14 NOTE — Progress Notes (Signed)
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 Infectious disease on board. -ID recommendations: Currently on Eraxis and ceftaz/vancomycin   Myeloproliferative disorder with polycythemia vera and thrombocytosis plt stable on anagrelide   Encephalopathy? She is very drowsy, unclear baseline  Acute blood loss anemia/post op  In setting of recent surgery. S/p 5 units of PRBC. Currently stable.  FTT: 5th hospitalization since  02/04/2018  Code Status: full  Family Communication: patient   Disposition Plan: not ready to discharge   Consultants:  Orthopedics is primary  Infectious disease as Optometrist  hospitalist as consultant  Procedures:  As above  Antibiotics:  As above   Objective: BP 97/64   Pulse 96   Temp (!) 97.4 F (36.3 C)   Resp 16   Ht 5\' 4"  (1.626 m)   Wt 71.1 kg   SpO2 96%   BMI 26.91 kg/m   Intake/Output Summary (Last 24 hours) at 06/14/2018 1902 Last data filed at 06/14/2018 1855 Gross per 24 hour  Intake 421.47 ml  Output 1100 ml  Net -678.53 ml   Filed Weights   06/03/18 1254 06/12/18 0500  Weight: 55.8 kg 71.1 kg    Exam: Patient is examined daily including today on  06/14/2018, exams remain the same as of yesterday except that has changed    General:  sleepy  Cardiovascular: RRR  Respiratory: CTABL  Abdomen: Soft/ND/NT, positive BS  Musculoskeletal: left arm Edema, right hip post op changes  Neuro: sleepy  Data Reviewed: Basic Metabolic Panel: Recent Labs  Lab 06/09/18 0313  06/12/18 0316 06/13/18 0432 06/13/18 2005 06/14/18 0529 06/14/18 0832  NA 138  --   --  134* 135  --  134*  K 4.7  --   --  5.2* 5.2*  --  4.9  CL 112*  --   --  111 112*  --  111  CO2 20*  --   --  17* 17*  --  18*  GLUCOSE 129*  --   --  97 107*  --  100*  BUN 12  --   --  24* 27*  --  29*  CREATININE 0.80   < > 1.07* 1.63* 1.90* 1.89* 1.87*  CALCIUM 7.9*  --   --  7.8* 7.7*  --  7.6*   < > = values in this interval not displayed.   Liver Function Tests: No results for input(s): AST, ALT, ALKPHOS, BILITOT, PROT, ALBUMIN in the last 168 hours. No results for input(s): LIPASE, AMYLASE in the last 168 hours. No results for input(s): AMMONIA in the last 168 hours. CBC: Recent Labs  Lab 06/09/18 0313 06/13/18 0432  WBC 38.0* 46.9*  NEUTROABS 33.2* 42.6*  HGB 11.2* 11.4*  HCT 37.7 37.2  MCV 86.9  84.7  PLT 429* 302   Cardiac Enzymes:   No results for input(s): CKTOTAL, CKMB, CKMBINDEX, TROPONINI in the last 168 hours. BNP (last 3 results) No results for input(s): BNP in the last 8760 hours.  ProBNP (last 3 results) No results for input(s): PROBNP in the last 8760 hours.  CBG: No results for input(s): GLUCAP in the last 168 hours.  Recent Results (from the past 240 hour(s))  MRSA PCR Screening     Status: None   Collection Time: 06/05/18 12:13 PM  Result Value Ref Range Status   MRSA by PCR NEGATIVE NEGATIVE Final    Comment:        The GeneXpert MRSA Assay (FDA approved for NASAL specimens only), is one component of a comprehensive MRSA colonization surveillance program. It is not intended to diagnose MRSA infection nor to guide or monitor  treatment for MRSA infections. Performed at [facility], [place] [address] [place], [place] [address]      Studies: No results found.  Scheduled Meds: . anagrelide  1 mg Oral BID  . brinzolamide  1 drop Both Eyes BID   And  . brimonidine  1 drop Both Eyes BID  . Chlorhexidine Gluconate Cloth  6 each Topical Daily  . docusate sodium  100 mg Oral BID  . feeding supplement (ENSURE ENLIVE)  237 mL Oral TID BM  . gabapentin  300 mg Oral Daily  . Latanoprostene Bunod  1 drop Ophthalmic QHS  . mouth rinse  15 mL Mouth Rinse BID  . pantoprazole  40 mg Oral Daily  . rosuvastatin  5 mg Oral QPM  . sodium chloride flush  10 mL Intravenous Q12H  . sodium chloride flush  10-40 mL Intracatheter Q12H  . timolol  1 drop Both Eyes BID  . vitamin C  500 mg Oral Daily    Continuous Infusions: . sodium chloride Stopped (06/13/18 1406)  . anidulafungin Stopped (06/14/18 1254)  . cefTAZidime (FORTAZ)  IV Stopped (06/14/18 1031)     Time spent: 16mins I have personally reviewed and interpreted on  06/14/2018 daily labs, imagings as discussed above under date review session and assessment and plans.  I reviewed all nursing notes, pharmacy notes, consultant notes,  vitals, pertinent old records  I have discussed plan of care as described above with RN , patient  on 06/14/2018   Florencia Reasons MD, PhD  [facility] Pager [phone]. If 7PM-7AM, please contact night-coverage at [email or web address], password [facility] 06/14/2018, 7:02 PM  LOS: 11 days

## 2018-06-14 NOTE — Progress Notes (Signed)
[name], female   DOB: March 18, 1941, 77 y.o.   MRN: [id number]         [facility] for Infectious Disease  Date of Admission:  06/03/2018           Day 50 vancomycin        Day 50 ceftazidime        Day 10 antifungal therapy ASSESSMENT: She has a complicated right prosthetic hip infection.  Operative cultures grew "skin flora" in September.  She had persistent problems despite a long course of IV vancomycin and ceftazidime and was readmitted.  Hardware was removed and a spacer was placed.  Operative Gram stain showed gram-negative rods cultures have grown Candida tropicalis so far.  We will stop vancomycin now and continue ceftazidime and anidulafungin.  PLAN: 1. Continue ceftazidime and anidulafungin 2. Discontinue vancomycin 3. Please call me for any infectious disease questions this weekend  Active Problems:   Infection of prosthetic total hip joint ([place])   Pressure injury of skin   Scheduled Meds: . anagrelide  1 mg Oral BID  . brinzolamide  1 drop Both Eyes BID   And  . brimonidine  1 drop Both Eyes BID  . Chlorhexidine Gluconate Cloth  6 each Topical Daily  . docusate sodium  100 mg Oral BID  . feeding supplement (ENSURE ENLIVE)  237 mL Oral TID BM  . gabapentin  300 mg Oral Daily  . Latanoprostene Bunod  1 drop Ophthalmic QHS  . mouth rinse  15 mL Mouth Rinse BID  . pantoprazole  40 mg Oral Daily  . rosuvastatin  5 mg Oral QPM  . sodium chloride flush  10 mL Intravenous Q12H  . sodium chloride flush  10-40 mL Intracatheter Q12H  . timolol  1 drop Both Eyes BID  . vancomycin variable dose per unstable renal function (pharmacist dosing)   Does not apply See admin instructions  . vitamin C  500 mg Oral Daily   Continuous Infusions: . sodium chloride 250 mL (06/13/18 0946)  . anidulafungin    . cefTAZidime (FORTAZ)  IV     PRN Meds:.sodium chloride, acetaminophen, ALPRAZolam, alum & mag hydroxide-simeth, bisacodyl, diphenhydrAMINE, loratadine,  meclizine, menthol-cetylpyridinium **OR** phenol, methocarbamol **OR** [DISCONTINUED] methocarbamol (ROBAXIN) IV, metoCLOPramide **OR** metoCLOPramide (REGLAN) injection, ondansetron **OR** ondansetron (ZOFRAN) IV, oxyCODONE, polyethylene glycol, sodium chloride flush, sodium phosphate, traMADol  Review of Systems: Review of Systems  Unable to perform ROS: Mental acuity    No Known Allergies  OBJECTIVE: Vitals:   06/13/18 0501 06/13/18 1557 06/13/18 2209 06/14/18 0549  BP: 117/75 112/65 104/63 114/62  Pulse: (!) 120 (!) 122 (!) 108 (!) 109  Resp: 14  15 16   Temp: 98.2 F (36.8 C) 98.1 F (36.7 C) 98.3 F (36.8 C) (!) 97.4 F (36.3 C)  TempSrc: Oral Oral Oral Oral  SpO2: 93% 99% 100% 99%  Weight:      Height:       Body mass index is 26.91 kg/m.  Physical Exam  Constitutional:  She is sleeping soundly and does not respond to questions.  Musculoskeletal:  Her nurse reports that she had a large amount of thin, serous drainage from her right hip overnight that soaked her dressing.  Skin:  The erythema on her anterior chest seems to be fading.    Lab Results Lab Results  Component Value Date   WBC 46.9 (H) 06/13/2018   HGB 11.4 (L) 06/13/2018   HCT 37.2 06/13/2018   MCV 84.7 06/13/2018  PLT 302 06/13/2018    Lab Results  Component Value Date   CREATININE 1.87 (H) 06/14/2018   BUN 29 (H) 06/14/2018   NA 134 (L) 06/14/2018   K 4.9 06/14/2018   CL 111 06/14/2018   CO2 18 (L) 06/14/2018    Lab Results  Component Value Date   ALT 22 04/23/2018   AST 25 04/23/2018   ALKPHOS 128 (H) 04/23/2018   BILITOT 0.8 04/23/2018     Microbiology: Recent Results (from the past 240 hour(s))  MRSA PCR Screening     Status: None   Collection Time: 06/05/18 12:13 PM  Result Value Ref Range Status   MRSA by PCR NEGATIVE NEGATIVE Final    Comment:        The GeneXpert MRSA Assay (FDA approved for NASAL specimens only), is one component of a comprehensive MRSA  colonization surveillance program. It is not intended to diagnose MRSA infection nor to guide or monitor treatment for MRSA infections. Performed at [facility], [place] [address], [place], [place] [address]     Michel Bickers, [place] for [facility] [phone] pager   [phone] cell 06/14/2018, 9:18 AM

## 2018-06-15 ENCOUNTER — Inpatient Hospital Stay (HOSPITAL_COMMUNITY): Payer: Medicare HMO

## 2018-06-15 DIAGNOSIS — R609 Edema, unspecified: Secondary | ICD-10-CM

## 2018-06-15 LAB — CBC WITH DIFFERENTIAL/PLATELET
Abs Immature Granulocytes: 0.81 10*3/uL — ABNORMAL HIGH (ref 0.00–0.07)
BASOS PCT: 1 %
Basophils Absolute: 0.3 10*3/uL — ABNORMAL HIGH (ref 0.0–0.1)
Eosinophils Absolute: 0.1 10*3/uL (ref 0.0–0.5)
Eosinophils Relative: 0 %
HEMATOCRIT: 36.7 % (ref 36.0–46.0)
Hemoglobin: 11.1 g/dL — ABNORMAL LOW (ref 12.0–15.0)
IMMATURE GRANULOCYTES: 2 %
Lymphocytes Relative: 1 %
Lymphs Abs: 0.7 10*3/uL (ref 0.7–4.0)
MCH: 25.8 pg — ABNORMAL LOW (ref 26.0–34.0)
MCHC: 30.2 g/dL (ref 30.0–36.0)
MCV: 85.2 fL (ref 80.0–100.0)
Monocytes Absolute: 1.6 10*3/uL — ABNORMAL HIGH (ref 0.1–1.0)
Monocytes Relative: 3 %
NEUTROS PCT: 93 %
Neutro Abs: 48.8 10*3/uL — ABNORMAL HIGH (ref 1.7–7.7)
Platelets: 290 10*3/uL (ref 150–400)
RBC: 4.31 MIL/uL (ref 3.87–5.11)
RDW: 22.4 % — AB (ref 11.5–15.5)
WBC: 52.3 10*3/uL — AB (ref 4.0–10.5)
nRBC: 0 % (ref 0.0–0.2)

## 2018-06-15 LAB — URINALYSIS, ROUTINE W REFLEX MICROSCOPIC
Bilirubin Urine: NEGATIVE
Glucose, UA: NEGATIVE mg/dL
Ketones, ur: 5 mg/dL — AB
NITRITE: NEGATIVE
Protein, ur: 30 mg/dL — AB
SPECIFIC GRAVITY, URINE: 1.013 (ref 1.005–1.030)
WBC, UA: >50 WBC/hpf — ABNORMAL HIGH (ref 0–5)
pH: 5 (ref 5.0–8.0)

## 2018-06-15 LAB — BASIC METABOLIC PANEL
Anion gap: 6 (ref 5–15)
BUN: 33 mg/dL — AB (ref 8–23)
CALCIUM: 7.9 mg/dL — AB (ref 8.9–10.3)
CO2: 18 mmol/L — AB (ref 22–32)
CREATININE: 1.8 mg/dL — AB (ref 0.44–1.00)
Chloride: 112 mmol/L — ABNORMAL HIGH (ref 98–111)
GFR calc non Af Amer: 26 mL/min — ABNORMAL LOW (ref >60)
GFR, EST AFRICAN AMERICAN: 30 mL/min — AB (ref >60)
Glucose, Bld: 131 mg/dL — ABNORMAL HIGH (ref 70–99)
Potassium: 4.8 mmol/L (ref 3.5–5.1)
SODIUM: 136 mmol/L (ref 135–145)

## 2018-06-15 NOTE — Progress Notes (Signed)
CRITICAL VALUE STICKER  CRITICAL VALUE: WBC 52.3  MD NOTIFIED:  Yes, Dr. Erlinda Hong  TIME OF NOTIFICATION: 0745, 06/15/18  RESPONSE: awaiting response

## 2018-06-15 NOTE — Progress Notes (Signed)
Left lower extremity venous duplex has been completed. Negative for obvious evidence of DVT.  06/15/18 10:02 AM Alejandra [name] RVT

## 2018-06-15 NOTE — Progress Notes (Signed)
Consultation PROGRESS NOTE  Alejandra [name] DGL:[id number] DOB: 10/20/1940 DOA: 06/03/2018 PCP: Vincente Liberty, MD  HPI/Recap of past 24 hours:  She is alert , awake and interactive, though still very weak, she denies pain, she stays awake during the conversation today She is edematous diffusely   Assessment/Plan: Active Problems:   Infection of prosthetic total hip joint ([place])   Pressure injury of skin  Acute kidney injury Likely multifactorial , she has poor oral intake, has hypotension, recent treatement with vancomyin last dose of therapy was on 10/24, then she received vanc since 10/25 to 10/31 She is started on ivf, ua + for bacteria, urine culture pending collection,  renal US no obstruction  Repeat lab in am Renal dosing meds  Hyperkalemia: resolved  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 Infectious disease on board. -ID recommendations: vanc discontinued. She is Currently on Eraxis and ceftaz.    Myeloproliferative disorder with polycythemia vera and thrombocytosis plt stable on anagrelide  Per chart review, patient's hydrea was stopped in 04/2018, it was changed to anagrelide due to concerning for skin pigmentation She presented to the hospital with leukocytosis, wbc in the 60's, it decreased to 30's, now start to increase, case discussed with hematology/oncology on call Dr Audelia Hives who states will discuss case with Dr Benay Spice on monday  Encephalopathy? She is very drowsy, unclear baseline She is interactive today,  Continue to monitor  Acute blood loss anemia/post op  In setting of recent surgery. S/p 5 units of PRBC. Currently stable.  Decondition/diffuse anasarca: Generalized edema  FTT: 5th hospitalization since  02/04/2018  poor  prognosis  Code Status: full  Family Communication: patient   Disposition Plan: not ready to discharge   Consultants:  Orthopedics is primary  Infectious disease as Optometrist  hospitalist as consultant  Hematology oncology Dr Audelia Hives over the phone on 11/2  Procedures:  As above  Antibiotics:  As above   Objective: BP 115/64   Pulse (!) 101   Temp 97.8 F (36.6 C) (Oral)   Resp 16   Ht 5\' 4"  (1.626 m)   Wt 71.1 kg   SpO2 97%   BMI 26.91 kg/m   Intake/Output Summary (Last 24 hours) at 06/15/2018 1450 Last data filed at 06/15/2018 1300 Gross per 24 hour  Intake 487 ml  Output 550 ml  Net -63 ml   Filed Weights   06/03/18 1254 06/12/18 0500  Weight: 55.8 kg 71.1 kg    Exam: Patient is examined daily including today on 06/15/2018, exams remain the same as of yesterday except that has changed    General:  Very frail, but oriented x3  Cardiovascular: RRR  Respiratory: diminished at basis  Abdomen: Soft/ND/NT, positive BS  Musculoskeletal: generalized edema, left arm Edema more prominent with a few open blister, right hip post op changes  Neuro: aaox3  Data Reviewed: Basic Metabolic Panel: Recent Labs  Lab 06/09/18 0313  06/13/18 0432 06/13/18 2005 06/14/18 0529 06/14/18 0832 06/15/18 0622  NA 138  --  134* 135  --  134* 136  K 4.7  --  5.2* 5.2*  --  4.9 4.8  CL 112*  --  111 112*  --  111 112*  CO2 20*  --  17* 17*  --  18* 18*  GLUCOSE  129*  --  97 107*  --  100* 131*  BUN 12  --  24* 27*  --  29* 33*  CREATININE 0.80   < > 1.63* 1.90* 1.89* 1.87* 1.80*  CALCIUM 7.9*  --  7.8* 7.7*  --  7.6* 7.9*   < > = values in this interval not displayed.   Liver Function Tests: No results for input(s): AST, ALT, ALKPHOS, BILITOT, PROT, ALBUMIN in the last 168 hours. No results for input(s): LIPASE, AMYLASE in the last 168 hours. No results for input(s): AMMONIA in the last 168 hours. CBC: Recent Labs  Lab 06/09/18 0313 06/13/18 0432  06/15/18 0622  WBC 38.0* 46.9* 52.3*  NEUTROABS 33.2* 42.6* 48.8*  HGB 11.2* 11.4* 11.1*  HCT 37.7 37.2 36.7  MCV 86.9 84.7 85.2  PLT 429* 302 290   Cardiac Enzymes:   No results for input(s): CKTOTAL, CKMB, CKMBINDEX, TROPONINI in the last 168 hours. BNP (last 3 results) No results for input(s): BNP in the last 8760 hours.  ProBNP (last 3 results) No results for input(s): PROBNP in the last 8760 hours.  CBG: No results for input(s): GLUCAP in the last 168 hours.  No results found for this or any previous visit (from the past 240 hour(s)).   Studies: US Renal  Result Date: 06/15/2018 CLINICAL DATA:  Creatinine elevation. EXAM: RENAL / URINARY TRACT ULTRASOUND COMPLETE COMPARISON:  None. FINDINGS: Right Kidney: Renal measurements: 10.3 x 3.2 x 4.5 cm = volume: 79 mL . Echogenicity within normal limits. No mass or hydronephrosis visualized. Left Kidney: Renal measurements: 8.9 x 4.4 x 4.0 cm = volume: 83.8 mL. Echogenicity within normal limits. No mass or hydronephrosis visualized. Bladder: Focal soft tissue area is present along the base of the bladder measuring 2.9 x 1.2 x 2.7 cm. Appears to be increased through transmission with this area. Bilateral pleural effusions are noted. IMPRESSION: 1. Normal sonographic appearance of the kidneys bilaterally. 2. Soft tissue region at the base of the urinary bladder. This could represent hemorrhage. Soft tissue mass is considered less likely. Area was not apparent on recent CT scan. Given the artifact from adjacent hardware, MRI of pelvis would be the next best option for further evaluation. Cystoscopy could also be considered. 3. Bilateral pleural effusions are again noted. Electronically Signed   By: San Morelle M.D.   On: 06/15/2018 11:26    Scheduled Meds: . anagrelide  1 mg Oral BID  . brinzolamide  1 drop Both Eyes BID   And  . brimonidine  1 drop Both Eyes BID  . Chlorhexidine Gluconate Cloth  6 each Topical Daily  . docusate  sodium  100 mg Oral BID  . feeding supplement (ENSURE ENLIVE)  237 mL Oral TID BM  . gabapentin  300 mg Oral Daily  . Latanoprostene Bunod  1 drop Ophthalmic QHS  . mouth rinse  15 mL Mouth Rinse BID  . pantoprazole  40 mg Oral Daily  . rosuvastatin  5 mg Oral QPM  . sodium chloride flush  10 mL Intravenous Q12H  . sodium chloride flush  10-40 mL Intracatheter Q12H  . timolol  1 drop Both Eyes BID  . vitamin C  500 mg Oral Daily    Continuous Infusions: . sodium chloride Stopped (06/13/18 1406)  . anidulafungin 100 mg (06/15/18 1031)  . cefTAZidime (FORTAZ)  IV 2 g (06/15/18 7846)     Time spent: 69mins I have personally reviewed and interpreted on  06/15/2018 daily labs, imagings  as discussed above under date review session and assessment and plans.  I reviewed all nursing notes, pharmacy notes, consultant notes,  vitals, pertinent old records  I have discussed plan of care as described above with RN , patient  on 06/15/2018   Florencia Reasons MD, PhD  [facility] Pager [phone]. If 7PM-7AM, please contact night-coverage at [email or web address], password [facility] 06/15/2018, 2:50 PM  LOS: 12 days

## 2018-06-15 NOTE — Progress Notes (Signed)
   [name]   11 Days Post-Op Procedure(s) (LRB): WOUND EXPLORATION evacuation hematoma (Right)  Subjective: Reports min discomfort in right hip. Continued drainage right hip dressing with changes by nursing. Hypotensive and WBC 52 this am. Hospitalist notified.   Objective:  Vitals:   06/15/18 0504 06/15/18 0551  BP: (!) 86/73 109/69  Pulse: (!) 50 (!) 106  Resp: 16   Temp: (!) 97.4 F (36.3 C) 98.2 F (36.8 C)  SpO2: (!) 56% 98%     Neurologically intact ABD soft Neurovascular intact Sensation intact distally Intact pulses distally Dorsiflexion/Plantar flexion intact Incision: no drainage could be expressed, dressing with serous drainage No cellulitis present Compartment soft XR AP&Lat of hip shows well placed\fixed THA  Labs:  Recent Labs    06/13/18 0432 06/15/18 0622  HGB 11.4* 11.1*   Recent Labs    06/13/18 0432 06/15/18 0622  WBC 46.9* 52.3*  RBC 4.39 4.31  HCT 37.2 36.7  PLT 302 290   Recent Labs    06/14/18 0832 06/15/18 0622  NA 134* 136  K 4.9 4.8  CL 111 112*  CO2 18* 18*  BUN 29* 33*  CREATININE 1.87* 1.80*  GLUCOSE 100* 131*  CALCIUM 7.6* 7.9*    Assessment and Plan: 11 Days Post-Op Procedure(s) (LRB): WOUND EXPLORATION evacuation hematoma (Right) ADDITIONAL DIAGNOSIS:  Expected Acute Blood Loss Anemia, Polycythemia vera, stage II sacral decubitus ulcer Dressing mildly saturated 4 hrs after dressing change, will continue to monitor, no active drainage today  Plan: If drainage persists,  consider MRI scan of the right hip, however, I am not sure she would tolerate another large operation for irrigation and debridement.

## 2018-06-15 NOTE — Plan of Care (Signed)
Pt alert at times, very drowsy.  LUE swelling now with serous blisters. Md aware.  UA and urine culture sent. RN will monitor.

## 2018-06-16 ENCOUNTER — Inpatient Hospital Stay (HOSPITAL_COMMUNITY): Payer: Medicare HMO

## 2018-06-16 DIAGNOSIS — M7989 Other specified soft tissue disorders: Secondary | ICD-10-CM

## 2018-06-16 LAB — CBC WITH DIFFERENTIAL/PLATELET
ABS IMMATURE GRANULOCYTES: 0.86 10*3/uL — AB (ref 0.00–0.07)
BASOS PCT: 1 %
Basophils Absolute: 0.3 10*3/uL — ABNORMAL HIGH (ref 0.0–0.1)
EOS PCT: 0 %
Eosinophils Absolute: 0.1 10*3/uL (ref 0.0–0.5)
HCT: 36.2 % (ref 36.0–46.0)
Hemoglobin: 11 g/dL — ABNORMAL LOW (ref 12.0–15.0)
Immature Granulocytes: 2 %
Lymphocytes Relative: 2 %
Lymphs Abs: 0.7 10*3/uL (ref 0.7–4.0)
MCH: 25.9 pg — AB (ref 26.0–34.0)
MCHC: 30.4 g/dL (ref 30.0–36.0)
MCV: 85.4 fL (ref 80.0–100.0)
Monocytes Absolute: 1.5 10*3/uL — ABNORMAL HIGH (ref 0.1–1.0)
Monocytes Relative: 3 %
NEUTROS ABS: 42.1 10*3/uL — AB (ref 1.7–7.7)
Neutrophils Relative %: 92 %
Platelets: 254 10*3/uL (ref 150–400)
RBC: 4.24 MIL/uL (ref 3.87–5.11)
RDW: 22.5 % — AB (ref 11.5–15.5)
WBC: 47.3 10*3/uL — AB (ref 4.0–10.5)
nRBC: 0 % (ref 0.0–0.2)

## 2018-06-16 LAB — COMPREHENSIVE METABOLIC PANEL
ALK PHOS: 114 U/L (ref 38–126)
ALT: 6 U/L (ref 0–44)
AST: 16 U/L (ref 15–41)
Albumin: 1.2 g/dL — ABNORMAL LOW (ref 3.5–5.0)
Anion gap: 9 (ref 5–15)
BILIRUBIN TOTAL: 0.6 mg/dL (ref 0.3–1.2)
BUN: 33 mg/dL — AB (ref 8–23)
CALCIUM: 8 mg/dL — AB (ref 8.9–10.3)
CO2: 18 mmol/L — ABNORMAL LOW (ref 22–32)
CREATININE: 1.86 mg/dL — AB (ref 0.44–1.00)
Chloride: 112 mmol/L — ABNORMAL HIGH (ref 98–111)
GFR calc Af Amer: 29 mL/min — ABNORMAL LOW (ref >60)
GFR, EST NON AFRICAN AMERICAN: 25 mL/min — AB (ref >60)
Glucose, Bld: 64 mg/dL — ABNORMAL LOW (ref 70–99)
POTASSIUM: 4.4 mmol/L (ref 3.5–5.1)
Sodium: 139 mmol/L (ref 135–145)
TOTAL PROTEIN: 3.8 g/dL — AB (ref 6.5–8.1)

## 2018-06-16 LAB — BLOOD GAS, ARTERIAL
Acid-base deficit: 5 mmol/L — ABNORMAL HIGH (ref 0.0–2.0)
Bicarbonate: 18 mmol/L — ABNORMAL LOW (ref 20.0–28.0)
Drawn by: 308601
FIO2: 21
O2 Saturation: 93 %
Patient temperature: 37
pCO2 arterial: 28.3 mmHg — ABNORMAL LOW (ref 32.0–48.0)
pH, Arterial: 7.419 (ref 7.350–7.450)
pO2, Arterial: 67 mmHg — ABNORMAL LOW (ref 83.0–108.0)

## 2018-06-16 LAB — URINE CULTURE

## 2018-06-16 MED ORDER — SODIUM BICARBONATE 650 MG PO TABS
650.0000 mg | ORAL_TABLET | Freq: Two times a day (BID) | ORAL | Status: DC
  Administered 2018-06-16 – 2018-06-18 (×3): 650 mg via ORAL
  Filled 2018-06-16 (×5): qty 1

## 2018-06-16 MED ORDER — FUROSEMIDE 10 MG/ML IJ SOLN
40.0000 mg | Freq: Two times a day (BID) | INTRAMUSCULAR | Status: AC
  Administered 2018-06-16 – 2018-06-17 (×2): 40 mg via INTRAVENOUS
  Filled 2018-06-16 (×2): qty 4

## 2018-06-16 MED ORDER — ALBUMIN HUMAN 25 % IV SOLN
12.5000 g | Freq: Two times a day (BID) | INTRAVENOUS | Status: AC
  Administered 2018-06-16 – 2018-06-17 (×2): 12.5 g via INTRAVENOUS
  Filled 2018-06-16 (×2): qty 50

## 2018-06-16 NOTE — Plan of Care (Signed)
Pt more alert this morning, some confusion. LUE still with serous blisters.  Dressing changes per order. RN will monitor.

## 2018-06-16 NOTE — Progress Notes (Addendum)
Paged Ortho. While doing a dressing change due to incontinence, it was noted that the incision was open approx 1 in. Moderate drainage noted.  Will CTM and await new orders  Spoke with Andee Poles, [place]. She stated to do dressing changes as needed and she will inform Dr. Mayer Camel. 06/16/18 @ 2120 Julio Alm RN

## 2018-06-16 NOTE — Progress Notes (Signed)
Upon assessment, pt was difficult to rouse, and very drowsy upon waking. Pt continued to fall asleep during conversations. VSS. Provider made aware via Amion.  Julio Alm, RN

## 2018-06-16 NOTE — Progress Notes (Signed)
   [name]   12 Days Post-Op Procedure(s) (LRB): WOUND EXPLORATION evacuation hematoma (Right)  Subjective: Sleeping, when awake reports no pain right hip.   Objective:  Vitals:   06/15/18 2211 06/16/18 0640  BP: (!) 108/53 (!) 114/94  Pulse: 100 (!) 110  Resp: 16 16  Temp: 97.8 F (36.6 C) (!) 97.3 F (36.3 C)  SpO2: 100% 100%     ABD soft Neurovascular intact Sensation intact distally Intact pulses distally Dorsiflexion/Plantar flexion intact Incision:no drainage, clean  And healing No cellulitis present Compartment soft XR AP&Lat of hip shows well placed\fixed THA  Labs:  Recent Labs    06/15/18 0622 06/16/18 0414  HGB 11.1* 11.0*   Recent Labs    06/15/18 0622 06/16/18 0414  WBC 52.3* 47.3*  RBC 4.31 4.24  HCT 36.7 36.2  PLT 290 254   Recent Labs    06/15/18 0622 06/16/18 0414  NA 136 139  K 4.8 4.4  CL 112* 112*  CO2 18* 18*  BUN 33* 33*  CREATININE 1.80* 1.86*  GLUCOSE 131* 64*  CALCIUM 7.9* 8.0*    Assessment and Plan: 12 Days Post-OpProcedure(s) (LRB): WOUND EXPLORATION evacuation hematoma (Right) ADDITIONAL DIAGNOSIS: Expected Acute Blood Loss Anemia, Polycythemia vera, stage II sacral decubitus ulcer Dressing clean today 4 hrs after dressing change, will continue to monitor, no active drainage today  Appreciate hospitalist input and help with this patient

## 2018-06-16 NOTE — Progress Notes (Addendum)
Consultation PROGRESS NOTE  Alejandra [name] NTZ:[id number] DOB: [birth year] DOA: 06/03/2018 PCP: Vincente Liberty, MD  HPI/Recap of past 24 hours:  She is alert , awake and interactive, though still very weak,  she denies pain, she stays awake during the conversation today She is smiling today  With her sister and brother at bedside She is edematous diffusely   Assessment/Plan: Active Problems:   Infection of prosthetic total hip joint ([place])   Pressure injury of skin  Acute kidney injury Likely multifactorial , she has poor oral intake, has hypotension, recent treatement with vancomyin last dose of therapy was on 10/24, then she received vanc since 10/25 to 10/31 ua + for bacteria, urine culture insignificant growth,  renal US no obstruction  Repeat lab in am Renal dosing meds  Hyperkalemia: resolved  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 Infectious disease on board. -ID recommendations: vanc discontinued. She is Currently on Eraxis and ceftaz.  -weight bearing 50% on the right per ortho  Acute blood loss anemia/post op  In setting of recent surgery. S/p 5 units of PRBC. Currently stable.  Myeloproliferative disorder with polycythemia vera and thrombocytosis plt stable on anagrelide  Per chart review, patient's hydrea was stopped in 04/2018, it was changed to anagrelide due to concerning for skin pigmentation She presented to the hospital with leukocytosis, wbc in the 60's, it decreased to 30's, now start to increase, case discussed with hematology/oncology on call Dr Audelia Hives who states will discuss case with Dr Benay Spice on monday  Encephalopathy? She has intermittent drowsiness,  She is interactive and alert today,  Continue to  monitor   Decondition/diffuse anasarca: Generalized edema, no DVT, no fluids collection in extremities  by [place], does has bilateral pleural effusions  over all 11liter +, low albumin level, will try iv lasix and albumin  Nutrition consult  FTT: 5th hospitalization since  02/04/2018  poor prognosis  Code Status: DNR, confirmed with patient and sister  Basilia Jumbo who is the HPOA/brother at bedside   Family Communication: patient   Disposition Plan: not ready to discharge   Consultants:  Orthopedics is primary  Infectious disease as Optometrist  hospitalist as consultant  Hematology oncology Dr Audelia Hives over the phone on 11/2  Procedures:  As above  Antibiotics:  As above   Objective: BP 116/69   Pulse (!) 111   Temp 98.3 F (36.8 C) (Oral)   Resp 16   Ht 5\' 4"  (1.626 m)   Wt 71.1 kg   SpO2 100%   BMI 26.91 kg/m   Intake/Output Summary (Last 24 hours) at 06/16/2018 1550 Last data filed at 06/16/2018 0900 Gross per 24 hour  Intake 273.99 ml  Output 700 ml  Net -426.01 ml   Filed Weights   06/03/18 1254 06/12/18 0500  Weight: 55.8 kg 71.1 kg    Exam: Patient is examined daily including today on 06/16/2018, exams remain the same as of yesterday except that has changed    General:  Very frail, but oriented x3  Cardiovascular: RRR  Respiratory: diminished at basis  Abdomen: Soft/ND/NT, positive BS  Musculoskeletal: generalized edema, left arm Edema more prominent with a few open blister, right hip post op changes  Neuro: aaox3  Data Reviewed: Basic Metabolic Panel: Recent Labs  Lab 06/13/18 0432 06/13/18 2005  06/14/18 0529 06/14/18 0832 06/15/18 0622 06/16/18 0414  NA 134* 135  --  134* 136 139  K 5.2* 5.2*  --  4.9 4.8 4.4  CL 111 112*  --  111 112* 112*  CO2 17* 17*  --  18* 18* 18*  GLUCOSE 97 107*  --  100* 131* 64*  BUN 24* 27*  --  29* 33* 33*  CREATININE 1.63* 1.90* 1.89* 1.87* 1.80* 1.86*  CALCIUM 7.8* 7.7*  --  7.6* 7.9* 8.0*   Liver  Function Tests: Recent Labs  Lab 06/16/18 0414  AST 16  ALT 6  ALKPHOS 114  BILITOT 0.6  PROT 3.8*  ALBUMIN 1.2*   No results for input(s): LIPASE, AMYLASE in the last 168 hours. No results for input(s): AMMONIA in the last 168 hours. CBC: Recent Labs  Lab 06/13/18 0432 06/15/18 0622 06/16/18 0414  WBC 46.9* 52.3* 47.3*  NEUTROABS 42.6* 48.8* 42.1*  HGB 11.4* 11.1* 11.0*  HCT 37.2 36.7 36.2  MCV 84.7 85.2 85.4  PLT 302 290 254   Cardiac Enzymes:   No results for input(s): CKTOTAL, CKMB, CKMBINDEX, TROPONINI in the last 168 hours. BNP (last 3 results) No results for input(s): BNP in the last 8760 hours.  ProBNP (last 3 results) No results for input(s): PROBNP in the last 8760 hours.  CBG: No results for input(s): GLUCAP in the last 168 hours.  Recent Results (from the past 240 hour(s))  Culture, Urine     Status: Abnormal   Collection Time: 06/15/18  1:50 PM  Result Value Ref Range Status   Specimen Description   Final    URINE, CLEAN CATCH Performed at [facility], [place] [address] [place], [place] [address]    Special Requests   Final    NONE Performed at [facility], [place] [address] [place], [place] [address]    Culture (A)  Final    <10,000 COLONIES/mL INSIGNIFICANT GROWTH Performed at [place] [address] [place], [place] [address]    Report Status 06/16/2018 FINAL  Final     Studies: No results found.  Scheduled Meds: . anagrelide  1 mg Oral BID  . brinzolamide  1 drop Both Eyes BID   And  . brimonidine  1 drop Both Eyes BID  . Chlorhexidine Gluconate Cloth  6 each Topical Daily  . docusate sodium  100 mg Oral BID  . feeding supplement (ENSURE ENLIVE)  237 mL Oral TID BM  . gabapentin  300 mg Oral Daily  . Latanoprostene Bunod  1 drop Ophthalmic QHS  . mouth rinse  15 mL Mouth Rinse BID  . pantoprazole  40 mg Oral Daily  . rosuvastatin  5 mg Oral QPM  . sodium chloride flush  10 mL  Intravenous Q12H  . sodium chloride flush  10-40 mL Intracatheter Q12H  . timolol  1 drop Both Eyes BID  . vitamin C  500 mg Oral Daily    Continuous Infusions: . sodium chloride 10 mL/hr at 06/16/18 0600  . anidulafungin 100 mg (06/16/18 1101)  . cefTAZidime (FORTAZ)  IV 2 g (06/16/18 1009)     Time spent: 31mins, case discussed with family, and ortho PA Ms Ruthe Mannan I have personally reviewed and interpreted on  06/16/2018 daily labs, imagings as discussed above under date review session and assessment and plans.  I reviewed all nursing notes, pharmacy notes, consultant notes,  vitals, pertinent old records  I have discussed plan of  care as described above with RN , patient  And family on 06/16/2018   Florencia Reasons MD, PhD  [facility] Pager [phone]. If 7PM-7AM, please contact night-coverage at [email or web address], password [facility] 06/16/2018, 3:50 PM  LOS: 13 days

## 2018-06-16 NOTE — Progress Notes (Signed)
Left upper extremity venous duplex has been completed. Negative for DVT.  06/16/18 4:05 PM Alejandra [name] RVT

## 2018-06-17 DIAGNOSIS — T8459XD Infection and inflammatory reaction due to other internal joint prosthesis, subsequent encounter: Secondary | ICD-10-CM

## 2018-06-17 DIAGNOSIS — T50905D Adverse effect of unspecified drugs, medicaments and biological substances, subsequent encounter: Secondary | ICD-10-CM

## 2018-06-17 DIAGNOSIS — R627 Adult failure to thrive: Secondary | ICD-10-CM

## 2018-06-17 DIAGNOSIS — T50905A Adverse effect of unspecified drugs, medicaments and biological substances, initial encounter: Secondary | ICD-10-CM

## 2018-06-17 DIAGNOSIS — R234 Changes in skin texture: Secondary | ICD-10-CM

## 2018-06-17 LAB — CBC WITH DIFFERENTIAL/PLATELET
ABS IMMATURE GRANULOCYTES: 1.21 10*3/uL — AB (ref 0.00–0.07)
Basophils Absolute: 0.4 10*3/uL — ABNORMAL HIGH (ref 0.0–0.1)
Basophils Relative: 1 %
EOS ABS: 0.1 10*3/uL (ref 0.0–0.5)
Eosinophils Relative: 0 %
HCT: 35.6 % — ABNORMAL LOW (ref 36.0–46.0)
HEMOGLOBIN: 10.9 g/dL — AB (ref 12.0–15.0)
IMMATURE GRANULOCYTES: 2 %
LYMPHS ABS: 0.9 10*3/uL (ref 0.7–4.0)
Lymphocytes Relative: 2 %
MCH: 26 pg (ref 26.0–34.0)
MCHC: 30.6 g/dL (ref 30.0–36.0)
MCV: 85 fL (ref 80.0–100.0)
MONOS PCT: 3 %
Monocytes Absolute: 1.7 10*3/uL — ABNORMAL HIGH (ref 0.1–1.0)
NEUTROS ABS: 46.3 10*3/uL — AB (ref 1.7–7.7)
NEUTROS PCT: 92 %
PLATELETS: 238 10*3/uL (ref 150–400)
RBC: 4.19 MIL/uL (ref 3.87–5.11)
RDW: 22.5 % — ABNORMAL HIGH (ref 11.5–15.5)
WBC: 50.6 10*3/uL (ref 4.0–10.5)
nRBC: 0 % (ref 0.0–0.2)

## 2018-06-17 LAB — BASIC METABOLIC PANEL
ANION GAP: 8 (ref 5–15)
BUN: 33 mg/dL — ABNORMAL HIGH (ref 8–23)
CHLORIDE: 114 mmol/L — AB (ref 98–111)
CO2: 19 mmol/L — ABNORMAL LOW (ref 22–32)
Calcium: 8.1 mg/dL — ABNORMAL LOW (ref 8.9–10.3)
Creatinine, Ser: 1.89 mg/dL — ABNORMAL HIGH (ref 0.44–1.00)
GFR calc Af Amer: 28 mL/min — ABNORMAL LOW (ref >60)
GFR, EST NON AFRICAN AMERICAN: 24 mL/min — AB (ref >60)
GLUCOSE: 97 mg/dL (ref 70–99)
Potassium: 4.2 mmol/L (ref 3.5–5.1)
Sodium: 141 mmol/L (ref 135–145)

## 2018-06-17 MED ORDER — METOPROLOL TARTRATE 5 MG/5ML IV SOLN
5.0000 mg | Freq: Once | INTRAVENOUS | Status: AC
  Administered 2018-06-17: 5 mg via INTRAVENOUS
  Filled 2018-06-17: qty 5

## 2018-06-17 MED ORDER — ADULT MULTIVITAMIN W/MINERALS CH
1.0000 | ORAL_TABLET | Freq: Every day | ORAL | Status: DC
  Administered 2018-06-17: 1 via ORAL
  Filled 2018-06-17 (×2): qty 1

## 2018-06-17 NOTE — Progress Notes (Signed)
Pt's MEWS score is currently yellow. No acute change. Pt is now a DNR with a palliative consult ordered.

## 2018-06-17 NOTE — Progress Notes (Signed)
[name]  MRN: [id number]  DOB/AGE:  [birth year] / 77 y.o.  13 Days Post-Op Procedure(s) (LRB): WOUND EXPLORATION evacuation hematoma (Right)    PROGRESS NOTE Subjective: Nurse reports that the wound opened up again last night with significant serosanguineous drainage.  The patient remains relatively somnolent and has developed some blisters on her arms over the weekend. .    Objective: Vital signs in last 24 hours: Vitals:   06/16/18 2021 06/16/18 2226 06/16/18 2228 06/17/18 0626  BP: 133/70  119/78 126/67  Pulse: (Abnormal) 109 (Abnormal) 102 (Abnormal) 104 (Abnormal) 101  Resp: 16  12 12   Temp: 98.1 F (36.7 C)  97.8 F (36.6 C) 98.2 F (36.8 C)  TempSrc: Oral  Oral Oral  SpO2: 100% 100% 94% 100%  Weight:      Height:          Intake/Output from previous day: I/O last 3 completed shifts: In: 705.2 [P.O.:80; I.V.:115.1; IV Piggyback:510.1] Out: 1400 [Urine:1400]   Intake/Output this shift: No intake/output data recorded.   LABORATORY DATA: Recent Labs    06/16/18 0414 06/17/18 0514  WBC 47.3* 50.6*  HGB 11.0* 10.9*  HCT 36.2 35.6*  PLT 254 238  NA 139 141  K 4.4 4.2  CL 112* 114*  CO2 18* 19*  BUN 33* 33*  CREATININE 1.86* 1.89*  GLUCOSE 64* 97  CALCIUM 8.0* 8.1*    Examination: Neurologically intact ABD soft Neurovascular intact Sensation intact distally Intact pulses distally Dorsiflexion/Plantar flexion intact Incision: moderate drainage No cellulitis present Compartment soft} XR AP&Lat of hip shows well placed\fixed THA  Assessment:   13 Days Post-Op Procedure(s) (LRB): WOUND EXPLORATION evacuation hematoma (Right)   Active wound infection despite removing all hardware with radical irrigation debridement and placement of large bore drains.  Patient's tri-clonal polycythemia vera prevents her from being able to fight off any infection.  Every time we operate there is at least a 5 to 6 unit bleed.  Patient elected to become DNR  last night.  ADDITIONAL DIAGNOSIS:  Expected Acute Blood Loss Anemia, tri-clonal polycythemia vera.  Plan:  Had discussion at bedside with the patient, regarding hospice.  Any further surgery would carry a significant risk of mortality and would be unlikely to solve the issues with bleeding and the recalcitrant infection.  We will try to contact her sister this morning.  Will obtain hospice consult.

## 2018-06-17 NOTE — Progress Notes (Addendum)
INFECTIOUS DISEASE PROGRESS NOTE  ID: Alejandra [name] is a 77 y.o. female with  Active Problems:   Infection of prosthetic total hip joint ([place])   Pressure injury of skin  Subjective: No complaints.   Abtx:  Anti-infectives (From admission, onward)   Start     Dose/Rate Route Frequency Ordered Stop   06/14/18 1000  anidulafungin (ERAXIS) 100 mg in sodium chloride 0.9 % 100 mL IVPB     100 mg 78 mL/hr over 100 Minutes Intravenous Every 24 hours 06/12/18 1647     06/14/18 1000  cefTAZidime (FORTAZ) 2 g in sodium chloride 0.9 % 100 mL IVPB     2 g 200 mL/hr over 30 Minutes Intravenous Every 24 hours 06/13/18 1226     06/13/18 1800  anidulafungin (ERAXIS) 100 mg in sodium chloride 0.9 % 100 mL IVPB  Status:  Discontinued     100 mg 78 mL/hr over 100 Minutes Intravenous Every 24 hours 06/12/18 1606 06/12/18 1647   06/13/18 1020  vancomycin variable dose per unstable renal function (pharmacist dosing)  Status:  Discontinued      Does not apply See admin instructions 06/13/18 1020 06/14/18 0922   06/13/18 1000  anidulafungin (ERAXIS) 200 mg in sodium chloride 0.9 % 200 mL IVPB     200 mg 78 mL/hr over 200 Minutes Intravenous  Once 06/12/18 1647 06/13/18 1259   06/12/18 1700  anidulafungin (ERAXIS) 200 mg in sodium chloride 0.9 % 200 mL IVPB  Status:  Discontinued     200 mg 78 mL/hr over 200 Minutes Intravenous  Once 06/12/18 1606 06/12/18 1647   06/07/18 1400  fluconazole (DIFLUCAN) IVPB 400 mg  Status:  Discontinued     400 mg 100 mL/hr over 120 Minutes Intravenous Every 24 hours 06/07/18 0856 06/12/18 1606   06/07/18 1200  vancomycin (VANCOCIN) IVPB 750 mg/150 ml premix  Status:  Discontinued     750 mg 150 mL/hr over 60 Minutes Intravenous Every 24 hours 06/07/18 0856 06/13/18 1020   06/07/18 1000  cefTAZidime (FORTAZ) 2 g in sodium chloride 0.9 % 100 mL IVPB  Status:  Discontinued     2 g 200 mL/hr over 30 Minutes Intravenous Every 12 hours 06/07/18 0856 06/13/18 1226   06/07/18 0800  amoxicillin-clavulanate (AUGMENTIN) 500-125 MG per tablet 500 mg  Status:  Discontinued     1 tablet Oral 2 times daily 06/03/18 2224 06/05/18 1620   06/06/18 1600  fluconazole (DIFLUCAN) IVPB 200 mg  Status:  Discontinued     200 mg 100 mL/hr over 60 Minutes Intravenous Every 24 hours 06/06/18 1000 06/07/18 0856   06/06/18 1000  vancomycin (VANCOCIN) IVPB 750 mg/150 ml premix  Status:  Discontinued     750 mg 150 mL/hr over 60 Minutes Intravenous Every 36 hours 06/05/18 1114 06/07/18 0856   06/06/18 1000  cefTAZidime (FORTAZ) 2 g in sodium chloride 0.9 % 100 mL IVPB  Status:  Discontinued     2 g 200 mL/hr over 30 Minutes Intravenous Every 24 hours 06/05/18 1149 06/07/18 0856   06/05/18 1700  fluconazole (DIFLUCAN) IVPB 400 mg  Status:  Discontinued     400 mg 100 mL/hr over 120 Minutes Intravenous Every 24 hours 06/05/18 1620 06/06/18 1000   06/04/18 2000  vancomycin (VANCOCIN) IVPB 750 mg/150 ml premix  Status:  Discontinued     750 mg 150 mL/hr over 60 Minutes Intravenous Every 24 hours 06/04/18 1906 06/05/18 1114   06/04/18 0713  vancomycin variable dose  per unstable renal function (pharmacist dosing)  Status:  Discontinued      Does not apply See admin instructions 06/04/18 0714 06/04/18 1916   06/03/18 2330  ceftAZIDime (FORTAZ) IVPB 2 g  Status:  Discontinued     2 g 100 mL/hr over 30 Minutes Intravenous 2 times daily 06/03/18 2224 06/03/18 2321   06/03/18 2330  cefTAZidime (FORTAZ) 2 g in sodium chloride 0.9 % 100 mL IVPB  Status:  Discontinued     2 g 200 mL/hr over 30 Minutes Intravenous Every 12 hours 06/03/18 2322 06/05/18 1149   06/03/18 2230  vancomycin IVPB  Status:  Discontinued     750 mg Intravenous Every 24 hours 06/03/18 2224 06/03/18 2323   06/03/18 1710  tobramycin (NEBCIN) powder  Status:  Discontinued       As needed 06/03/18 1812 06/03/18 1812   06/03/18 1710  vancomycin (VANCOCIN) powder  Status:  Discontinued       As needed 06/03/18 1812  06/03/18 1812   06/03/18 1530  cefTAZidime (FORTAZ) 2 g in sodium chloride 0.9 % 100 mL IVPB     2 g 200 mL/hr over 30 Minutes Intravenous  Once 06/03/18 1521 06/03/18 1629   06/03/18 1530  vancomycin (VANCOCIN) IVPB 750 mg/150 ml premix     750 mg 150 mL/hr over 60 Minutes Intravenous  Once 06/03/18 1521 06/03/18 1650      Medications:  Scheduled: . brinzolamide  1 drop Both Eyes BID   And  . brimonidine  1 drop Both Eyes BID  . Chlorhexidine Gluconate Cloth  6 each Topical Daily  . docusate sodium  100 mg Oral BID  . feeding supplement (ENSURE ENLIVE)  237 mL Oral TID BM  . gabapentin  300 mg Oral Daily  . Latanoprostene Bunod  1 drop Ophthalmic QHS  . mouth rinse  15 mL Mouth Rinse BID  . multivitamin with minerals  1 tablet Oral Daily  . pantoprazole  40 mg Oral Daily  . rosuvastatin  5 mg Oral QPM  . sodium bicarbonate  650 mg Oral BID  . sodium chloride flush  10 mL Intravenous Q12H  . sodium chloride flush  10-40 mL Intracatheter Q12H  . timolol  1 drop Both Eyes BID  . vitamin C  500 mg Oral Daily    Objective: Vital signs in last 24 hours: Temp:  [97.5 F (36.4 C)-98.2 F (36.8 C)] 97.9 F (36.6 C) (11/04 1813) Pulse Rate:  [101-109] 108 (11/04 1813) Resp:  [12-16] 16 (11/04 1813) BP: (101-133)/(67-85) 117/82 (11/04 1813) SpO2:  [94 %-100 %] 100 % (11/04 1813)   General appearance: alert, cooperative and no distress Throat: no oral lesions Resp: clear to auscultation bilaterally Cardio: regular rate and rhythm GI: normal findings: bowel sounds normal and soft, non-tender Extremities: edema anasarca and RUE PIC is clean at os. she has desquamation on her face and neck, mild. her erythema is better.  R hip dressed.   Lab Results Recent Labs    06/16/18 0414 06/17/18 0514  WBC 47.3* 50.6*  HGB 11.0* 10.9*  HCT 36.2 35.6*  NA 139 141  K 4.4 4.2  CL 112* 114*  CO2 18* 19*  BUN 33* 33*  CREATININE 1.86* 1.89*   Liver Panel Recent Labs     06/16/18 0414  PROT 3.8*  ALBUMIN 1.2*  AST 16  ALT 6  ALKPHOS 114  BILITOT 0.6   Sedimentation Rate No results for input(s): ESRSEDRATE in the last 72 hours.  C-Reactive Protein No results for input(s): CRP in the last 72 hours.  Microbiology: Recent Results (from the past 240 hour(s))  Culture, Urine     Status: Abnormal   Collection Time: 06/15/18  1:50 PM  Result Value Ref Range Status   Specimen Description   Final    URINE, CLEAN CATCH Performed at [facility], [place] [address], [place], [place] [address]    Special Requests   Final    NONE Performed at [facility], [place] [address], [place], [place] [address]    Culture (A)  Final    <10,000 COLONIES/mL INSIGNIFICANT GROWTH Performed at [place] [address] [place], [place] [address]    Report Status 06/16/2018 FINAL  Final    Studies/Results: No results found.   Assessment/Plan: Wound Infection- C tropicalis, GNR Prosthetic Joint Resection P vera Rash  Total days of antibiotics:76flucon --> 3 eraxis, 53ceftaz- (50 vanco stopped 11-1)  Will stop ceftaz Palliative care to eval for her failure to progress, failure to thrive.  WBC stable from adm Appreciate heme f/u, hospitalist input.          Bobby Rumpf MD, FACP Infectious Diseases (pager) [phone] [email or web address] 06/17/2018, 6:18 PM  LOS: 14 days

## 2018-06-17 NOTE — Progress Notes (Signed)
IP PROGRESS NOTE  Subjective:   Alejandra [name] appears weak.  No pruritus. Objective: Vital signs in last 24 hours: Blood pressure 101/85, pulse (!) 101, temperature (!) 97.5 F (36.4 C), temperature source Oral, resp. rate 14, height 5\' 4"  (1.626 m), weight 156 lb 12 oz (71.1 kg), SpO2 100 %.  Intake/Output from previous day: 11/03 0701 - 11/04 0700 In: 431.2 [P.O.:80; I.V.:71.1; IV Piggyback:280.1] Out: 1000 [Urine:1000]  Physical Exam:  Cardiac: Regular rate and rhythm Lungs: Clear anteriorly, no respiratory distress Musculoskeletal: The right thigh dressing is dry Skin: Erythematous maculopapular rash over the trunk and arms, several blister lesions over each arm Vascular: Trace pitting edema at the lower leg bilaterally     Lab Results: Recent Labs    06/16/18 0414 06/17/18 0514  WBC 47.3* 50.6*  HGB 11.0* 10.9*  HCT 36.2 35.6*  PLT 254 238    BMET Recent Labs    06/16/18 0414 06/17/18 0514  NA 139 141  K 4.4 4.2  CL 112* 114*  CO2 18* 19*  GLUCOSE 64* 97  BUN 33* 33*  CREATININE 1.86* 1.89*  CALCIUM 8.0* 8.1*     Medications: I have reviewed the patient's current medications.  Assessment/Plan:  1. Polycythemia vera on hydroxyurea. Last phlebotomy 09/08/2015  Hydroxyurea dose increased to 1500 mg daily 06/26/2016  Hydroxyurea changed to 1500 mg daily on Monday through Friday, 1000 mg on Saturday and Sunday beginning 09/11/2016  Hydroxyurea changed to 1500 mg Monday Wednesday and Friday and 1000 mg other days beginning 10/10/2016  Hydroxyurea changed to 1000 mg daily beginning 10/31/2016  Hydroxyurea placed on hold beginning 11/27/2016-she misunderstood our instructions and continued 1000 mg daily.  Hydroxyurea dose decreased to 500 mg daily beginning 01/01/2017  Hydroxyurea dose increased 06/05/2017  Hydroxyurea dose increased to 1000 mg daily except for 500 mg on Monday, Wednesday, and Friday, 11/01/2017  Hydroxyurea discontinued  04/25/2018  Anagrelide started 04/25/2018 2. Hypertension, followed by Dr. Montez Morita.  3. History of hyperpigmentation at the hands. Question related to hydroxyurea. 4. Hip fracture February 2013. She underwent a bipolar hip arthroplasty on 04/23/2012. 5. Thrombocytosis. Most likely secondary to polycythemia vera and iron deficiency.  Improved 6. Hospitalization with left shoulder cellulitis February 2016. 7. Pelvic fracture after a fall 05/31/2015 8. Weight loss-etiology unclear; weight stable 12/16/2015 and06/15/2017 9. Right anterior buccal ulcer 03/09/2016 10. Revision of right hip arthroplasty 0/34/7425, complicated by intraoperative and postoperative bleeding requiring multiple red cell transfusions 11. Swelling at the right hip surgical site week of 04/08/2018- 220 cc bloody fluid aspirated from the right hip surgical site 04/11/2018  Right thigh wound dehiscence prompting readmission 04/23/2018  CT 04/23/2018- gas/fluid collection at the lateral right hip with area of focal cortical destruction concerning for infection  Irrigation/debridement and replacement of right hip hardware 04/24/2018  Persistent sinus tract prompting removal of the right hip hardware and wound debridement on 06/03/2018, cultures 06/03/2018+ for Candida tropicalis  Postoperative bleeding prompting wound exploration, cauterization of "bleeders ", placement of Hemovacs, and a platelet transfusion in the early a.m. on 06/04/2018 12. Coagulopathy 13. Anemia secondary to surgical blood loss 14.Fever/cough, chest x-ray 05/01/2018 suggestive of pneumonia 15.  Rash- likely medication related 16.  Acute renal failure  Alejandra [name] is stable from a hematologic standpoint.  The neutrophilia is chronic and likely related to the proximal anemia vera.  The platelet count is controlled with anagrelide.  She has a diffuse rash, potentially related to antibiotics.  I doubt this is related to anagrelide, but will  hold for  now.  She appears to have failure to thrive following the recent surgery.  The wound continues to drain.  Dr. Mayer Camel does not recommend further surgery.  He plans for a hospice referral.  I am available to discuss the situation with her family and help with hospice care as needed.   Recommendations: 1.  Hold anagrelide 2.  Consider discontinuing ceftazidime 3.  Consider discontinuing nonessential medications    LOS: 14 days   Betsy Coder, MD   06/17/2018, 4:43 PM

## 2018-06-17 NOTE — Progress Notes (Signed)
PT Cancellation Note  Patient Details Name: Alejandra [name] MRN: [id number] DOB: 07/19/1941   Cancelled Treatment:     Panic High WBC 50.4   Will need to hold off Physical Therapy for today.     Rica Koyanagi  PTA Acute  Rehabilitation Services Pager      [phone] Office      [phone]

## 2018-06-17 NOTE — Care Management Important Message (Signed)
Important Message  Patient Details  Name: Alejandra [name] MRN: [id number] Date of Birth: Dec 27, 1940   Medicare Important Message Given:  Yes    Kerin Salen 06/17/2018, 1:19 PMImportant Message  Patient Details  Name: Alejandra [name] MRN: [id number] Date of Birth: Jun 14, 1941   Medicare Important Message Given:  Yes    Kerin Salen 06/17/2018, 1:19 PM

## 2018-06-17 NOTE — Progress Notes (Signed)
Paged on call provider via Amion Re: critical lab - WBC 50.2. Will CTM and await new orders. Julio Alm RN 06/17/18 @ [phone]

## 2018-06-17 NOTE — Progress Notes (Signed)
Nutrition Follow-up  INTERVENTION:   -Continue Ensure Enlive po BID, each supplement provides 350 kcal and 20 grams of protein -Provide Multivitamin with minerals daily  NUTRITION DIAGNOSIS:   Inadequate oral intake related to inability to eat as evidenced by NPO status.  Now on soft diet.  GOAL:   Patient will meet greater than or equal to 90% of their needs  Progresssing.  MONITOR:    PO intake, Supplement acceptance, Weight trends, Labs, Skin  REASON FOR ASSESSMENT:   Consult Assessment of nutrition requirement/status  ASSESSMENT:   77 year-old with PMH of arthritis, avascular necrosis and fracture of R femur head, cellulitis of shoulder, glaucoma, HTN, polycythemia vera, rhabdomyolysis, thrombocytosis. She presented on 10/21 with infected R total hip arthroplasty and on that date underwent revision, hardware removal, and vac placement. She returned to the OR on 10/22 for evacuation of hematoma at the site.   Patient continues to eat poorly. Ate 50% of lunch yesterday. Accepting of 2 Ensures daily. Palliative care has been consulted since pt is no longer a surgical candidate. Will await GOC decisions.  Patient was weighed 10/30, 156 lb. Currently +33 lb since admission. Pt with moderate edema present.  Medications: Vitamin C tablet daily  Labs reviewed: GFR: 28  Diet Order:   Diet Order            DIET SOFT Room service appropriate? Yes; Fluid consistency: Thin  Diet effective now              EDUCATION NEEDS:   No education needs have been identified at this time  Skin:  Skin Assessment: Skin Integrity Issues: Skin Integrity Issues:: Incisions, Wound VAC, Stage II Stage II: buttocks Wound Vac: R hip Incisions: R hip (10/21)  Last BM:  11/3  Height:   Ht Readings from Last 1 Encounters:  06/03/18 5\' 4"  (1.626 m)    Weight:   Wt Readings from Last 1 Encounters:  06/12/18 71.1 kg    Ideal Body Weight:  54.54 kg  BMI:  Body mass index is  26.91 kg/m.  Estimated Nutritional Needs:   Kcal:  1675-1840 (30-33 kcal/kg)  Protein:  72-84 grams (1.3-1.5 grams/kg)  Fluid:  >/= 1.7 L/day  Clayton Bibles, MS, RD, LDN [place] [facility] Pager: [phone] After Hours Pager: [phone]

## 2018-06-17 NOTE — Progress Notes (Signed)
Consultation PROGRESS NOTE  LAKEITA PANTHER HUD:[id number] DOB: 05-Apr-1941 DOA: 06/03/2018 PCP: Vincente Liberty, MD  HPI/Recap of past 24 hours:  She is alert , awake and interactive, though still very weak, not oriented to time she denies pain, she stays awake during the conversation today  She is edematous diffusely   Assessment/Plan: Active Problems:   Infection of prosthetic total hip joint (Protection)   Pressure injury of skin   Drug reaction  Acute kidney injury Likely multifactorial , she has poor oral intake, has hypotension, recent treatement with vancomyin last dose of therapy was on 10/24, then she received vanc since 10/25 to 10/31 ua + for bacteria, urine culture insignificant growth,  renal US no obstruction  Repeat lab in am Renal dosing meds  Hyperkalemia: resolved  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 Infectious disease on board. -ID recommendations: vanc discontinued. She is Currently on Eraxis and ceftaz.  -weight bearing 50% on the right per ortho  Acute blood loss anemia/post op  In setting of recent surgery. S/p 5 units of PRBC. Currently stable.  Myeloproliferative disorder with polycythemia vera and thrombocytosis plt stable on anagrelide  Per chart review, patient's hydrea was stopped in 04/2018, it was changed to anagrelide due to concerning for skin pigmentation She presented to the hospital with leukocytosis, wbc in the 60's, it decreased to 30's, now start to increase, oncology Dr Benay Spice following, input appreciated  Acute Metabolic Encephalopathy,  She has intermittent drowsiness,  She is interactive and alert today,  Continue to monitor   Decondition/diffuse anasarca: Generalized edema, no DVT, no fluids  collection in extremities  by [place], does has bilateral pleural effusions  over all 11liter +, low albumin level, will try iv lasix and albumin  Nutrition consult  FTT: 5th hospitalization since  02/04/2018  poor prognosis, primary team plan to discuss with family regarding hospice  Code Status: DNR, confirmed with patient and sister  Basilia Jumbo who is the HPOA/brother at bedside   Family Communication: patient and sister at bedside on 1/3  Disposition Plan: not ready to discharge   Consultants:  Orthopedics is primary  Infectious disease as Optometrist  hospitalist as consultant  Hematology oncology Dr Audelia Hives over the phone on 11/2  Procedures:  As above  Antibiotics:  As above   Objective: BP 117/82   Pulse (!) 108   Temp 97.9 F (36.6 C) (Oral)   Resp 16   Ht 5\' 4"  (1.626 m)   Wt 71.1 kg   SpO2 100%   BMI 26.91 kg/m   Intake/Output Summary (Last 24 hours) at 06/17/2018 1846 Last data filed at 06/17/2018 1800 Gross per 24 hour  Intake 1050.36 ml  Output 1250 ml  Net -199.64 ml   Filed Weights   06/03/18 1254 06/12/18 0500  Weight: 55.8 kg 71.1 kg    Exam: Patient is examined daily including today on 06/17/2018, exams remain the same as of yesterday except that has changed    General:  Very frail, but alert, interactive  Cardiovascular: RRR  Respiratory: diminished at basis  Abdomen: Soft/ND/NT, positive BS  Musculoskeletal: generalized edema, left arm Edema more prominent with a few open blister, right hip post op changes  Neuro: she is alert, interactive, she is not oriented to time  Data Reviewed: Basic  Metabolic Panel: Recent Labs  Lab 06/13/18 2005 06/14/18 0529 06/14/18 0832 06/15/18 0622 06/16/18 0414 06/17/18 0514  NA 135  --  134* 136 139 141  K 5.2*  --  4.9 4.8 4.4 4.2  CL 112*  --  111 112* 112* 114*  CO2 17*  --  18* 18* 18* 19*  GLUCOSE 107*  --  100* 131* 64* 97  BUN 27*  --  29* 33* 33* 33*  CREATININE 1.90* 1.89* 1.87* 1.80*  1.86* 1.89*  CALCIUM 7.7*  --  7.6* 7.9* 8.0* 8.1*   Liver Function Tests: Recent Labs  Lab 06/16/18 0414  AST 16  ALT 6  ALKPHOS 114  BILITOT 0.6  PROT 3.8*  ALBUMIN 1.2*   No results for input(s): LIPASE, AMYLASE in the last 168 hours. No results for input(s): AMMONIA in the last 168 hours. CBC: Recent Labs  Lab 06/13/18 0432 06/15/18 0622 06/16/18 0414 06/17/18 0514  WBC 46.9* 52.3* 47.3* 50.6*  NEUTROABS 42.6* 48.8* 42.1* 46.3*  HGB 11.4* 11.1* 11.0* 10.9*  HCT 37.2 36.7 36.2 35.6*  MCV 84.7 85.2 85.4 85.0  PLT 302 290 254 238   Cardiac Enzymes:   No results for input(s): CKTOTAL, CKMB, CKMBINDEX, TROPONINI in the last 168 hours. BNP (last 3 results) No results for input(s): BNP in the last 8760 hours.  ProBNP (last 3 results) No results for input(s): PROBNP in the last 8760 hours.  CBG: No results for input(s): GLUCAP in the last 168 hours.  Recent Results (from the past 240 hour(s))  Culture, Urine     Status: Abnormal   Collection Time: 06/15/18  1:50 PM  Result Value Ref Range Status   Specimen Description   Final    URINE, CLEAN CATCH Performed at [facility], [place] [address] [place], [place] [address]    Special Requests   Final    NONE Performed at [facility], [place] [address], [place], [place] [address]    Culture (A)  Final    <10,000 COLONIES/mL INSIGNIFICANT GROWTH Performed at [place] [address] [place], [place] [address]    Report Status 06/16/2018 FINAL  Final     Studies: No results found.  Scheduled Meds: . brinzolamide  1 drop Both Eyes BID   And  . brimonidine  1 drop Both Eyes BID  . Chlorhexidine Gluconate Cloth  6 each Topical Daily  . docusate sodium  100 mg Oral BID  . feeding supplement (ENSURE ENLIVE)  237 mL Oral TID BM  . gabapentin  300 mg Oral Daily  . Latanoprostene Bunod  1 drop Ophthalmic QHS  . mouth rinse  15 mL Mouth Rinse BID  . multivitamin with  minerals  1 tablet Oral Daily  . pantoprazole  40 mg Oral Daily  . rosuvastatin  5 mg Oral QPM  . sodium bicarbonate  650 mg Oral BID  . sodium chloride flush  10 mL Intravenous Q12H  . sodium chloride flush  10-40 mL Intracatheter Q12H  . timolol  1 drop Both Eyes BID  . vitamin C  500 mg Oral Daily    Continuous Infusions: . sodium chloride Stopped (06/17/18 1014)  . anidulafungin 100 mg (06/17/18 1237)     Time spent: 74mins, case discussed with infectious disease I have personally reviewed and interpreted on  06/17/2018 daily labs, imagings as discussed above under date review session and assessment and plans.  I reviewed all nursing notes, pharmacy notes,  consultant notes,  vitals, pertinent old records  I have discussed plan of care as described above with RN , patient  on 06/17/2018   Florencia Reasons MD, PhD  [facility] Pager [phone]. If 7PM-7AM, please contact night-coverage at [email or web address], password [facility] 06/17/2018, 6:46 PM  LOS: 14 days

## 2018-06-17 NOTE — Progress Notes (Signed)
Provider made aware of pt LOC and VS. New orders. Will CTM Marylu Lund 06/17/18 @ 2127

## 2018-06-18 DIAGNOSIS — Z515 Encounter for palliative care: Secondary | ICD-10-CM

## 2018-06-18 DIAGNOSIS — Z7189 Other specified counseling: Secondary | ICD-10-CM

## 2018-06-18 LAB — AEROBIC/ANAEROBIC CULTURE W GRAM STAIN (SURGICAL/DEEP WOUND)

## 2018-06-18 LAB — CBC WITH DIFFERENTIAL/PLATELET
Abs Immature Granulocytes: 1.13 10*3/uL — ABNORMAL HIGH (ref 0.00–0.07)
BASOS ABS: 0.3 10*3/uL — AB (ref 0.0–0.1)
BASOS PCT: 1 %
EOS ABS: 5.8 10*3/uL — AB (ref 0.0–0.5)
Eosinophils Relative: 12 %
HCT: 36.2 % (ref 36.0–46.0)
Hemoglobin: 11 g/dL — ABNORMAL LOW (ref 12.0–15.0)
Immature Granulocytes: 2 %
LYMPHS PCT: 2 %
Lymphs Abs: 0.8 10*3/uL (ref 0.7–4.0)
MCH: 25.6 pg — AB (ref 26.0–34.0)
MCHC: 30.4 g/dL (ref 30.0–36.0)
MCV: 84.4 fL (ref 80.0–100.0)
MONOS PCT: 4 %
Monocytes Absolute: 1.9 10*3/uL — ABNORMAL HIGH (ref 0.1–1.0)
Neutro Abs: 37.9 10*3/uL — ABNORMAL HIGH (ref 1.7–7.7)
Neutrophils Relative %: 79 %
Platelets: 227 10*3/uL (ref 150–400)
RBC: 4.29 MIL/uL (ref 3.87–5.11)
RDW: 22.4 % — ABNORMAL HIGH (ref 11.5–15.5)
WBC: 47.8 10*3/uL — AB (ref 4.0–10.5)
nRBC: 0 % (ref 0.0–0.2)

## 2018-06-18 LAB — BASIC METABOLIC PANEL
Anion gap: 13 (ref 5–15)
BUN: 36 mg/dL — AB (ref 8–23)
CO2: 19 mmol/L — ABNORMAL LOW (ref 22–32)
CREATININE: 1.99 mg/dL — AB (ref 0.44–1.00)
Calcium: 8.4 mg/dL — ABNORMAL LOW (ref 8.9–10.3)
Chloride: 110 mmol/L (ref 98–111)
GFR calc Af Amer: 27 mL/min — ABNORMAL LOW (ref >60)
GFR, EST NON AFRICAN AMERICAN: 23 mL/min — AB (ref >60)
GLUCOSE: 95 mg/dL (ref 70–99)
POTASSIUM: 3.8 mmol/L (ref 3.5–5.1)
Sodium: 142 mmol/L (ref 135–145)

## 2018-06-18 LAB — AEROBIC/ANAEROBIC CULTURE (SURGICAL/DEEP WOUND)

## 2018-06-18 MED ORDER — HALOPERIDOL LACTATE 5 MG/ML IJ SOLN: 0.5000 mg | INTRAMUSCULAR | Status: DC | PRN

## 2018-06-18 MED ORDER — LORAZEPAM 2 MG/ML IJ SOLN: 1.0000 mg | INTRAMUSCULAR | Status: DC | PRN

## 2018-06-18 MED ORDER — HALOPERIDOL 0.5 MG PO TABS
0.5000 mg | ORAL_TABLET | ORAL | Status: DC | PRN
  Filled 2018-06-18: qty 1

## 2018-06-18 MED ORDER — HYDROMORPHONE HCL 1 MG/ML IJ SOLN
0.5000 mg | INTRAMUSCULAR | Status: DC | PRN
  Administered 2018-06-18: 0.5 mg via INTRAVENOUS
  Filled 2018-06-18: qty 0.5

## 2018-06-18 MED ORDER — LORAZEPAM 1 MG PO TABS: 1.0000 mg | ORAL_TABLET | ORAL | Status: DC | PRN

## 2018-06-18 MED ORDER — HALOPERIDOL LACTATE 2 MG/ML PO CONC
0.5000 mg | ORAL | Status: DC | PRN
  Filled 2018-06-18: qty 0.3

## 2018-06-18 MED ORDER — GLYCOPYRROLATE 1 MG PO TABS
1.0000 mg | ORAL_TABLET | ORAL | Status: DC | PRN
  Filled 2018-06-18: qty 1

## 2018-06-18 MED ORDER — LORAZEPAM 2 MG/ML PO CONC: 1.0000 mg | ORAL | Status: DC | PRN

## 2018-06-18 MED ORDER — HYDROMORPHONE HCL 1 MG/ML IJ SOLN
0.5000 mg | INTRAMUSCULAR | Status: DC | PRN
  Administered 2018-06-19 (×3): 0.5 mg via INTRAVENOUS
  Filled 2018-06-18 (×3): qty 0.5

## 2018-06-18 MED ORDER — GLYCOPYRROLATE 0.2 MG/ML IJ SOLN
0.2000 mg | INTRAMUSCULAR | Status: DC | PRN
  Filled 2018-06-18: qty 1

## 2018-06-18 NOTE — Consult Note (Signed)
Consultation Note Date: 06/18/2018   Patient Name: Alejandra [name]  DOB: April 20, 1941  MRN: [id number]  Age / Sex: 77 y.o., female  PCP: Vincente Liberty, MD Referring Physician: Frederik Pear, MD  Reason for Consultation: Establishing goals of care, Hospice Evaluation, Pain control and Withdrawal of life-sustaining treatment  HPI/Patient Profile: 77 y.o. female  with past medical history of polycythemia vera admitted on 06/03/2018 with infected hip s/p removal of hardware and spacer placement.  Her hospital course has been complicated by her myeloproliferative disorder and she has continued clinical decline.  Palliative consulted for [place].  Clinical Assessment and Goals of Care: I met today with patients sister (HCPOA) and brother.   I introduced palliative care as specialized medical care for people living with serious illness. It focuses on providing relief from the symptoms and stress of a serious illness. The goal is to improve quality of life for both the patient and the family.  They report that the most important things to the patient are her family (she has been widowed for 20+ years) and her faith.  We discussed clinical course as well as wishes moving forward in regard to advanced directives.  Concepts specific to code status and care plan this hospitalization discussed.  We discussed difference between a aggressive medical intervention path and a palliative, comfort focused care path.  Values and goals of care important to patient and family were attempted to be elicited.  Concept of Hospice and Palliative Care were discussed  Questions and concerns addressed.   PMT will continue to support holistically.  SUMMARY OF RECOMMENDATIONS   - Family is in agreement with plan for full comfort care moving forward.  They would like to stop oral medications and eye drops as it is distressful for her to try to  take these. - Orders updated using comfort care order set. - Family is hopeful for transition to [facility] for EOL care.  Consult placed to social work to help facilitate.    Code Status/Advance Care Planning:  DNR  Palliative Prophylaxis:   Frequent Pain Assessment  Additional Recommendations (Limitations, Scope, Preferences):  Full Comfort Care  Psycho-social/Spiritual:   Desire for further Chaplaincy support:no  Additional Recommendations: Education on Hospice  Prognosis:   < 2 weeks- Ms. Knipp has multiple complications related to her polycythemia vera including worsening renal function.  She has been transitioned to full comfort including discontinuation of abx for hip infection s/p hardware removal.  She is likely to develop sepsis and now has also developed a desquamating skin condition (nursing report tape now pulling skin completely off- ? if appears to be SJS type syndrome).  I am very concerned her care needs and discomfort will significantly escalate (particularly related to skin sloughing). She would certainly be best served by residential hospice if it can be arranged.  Discharge Planning: Hospice facility      Primary Diagnoses: Present on Admission: **None**   I have reviewed the medical record, interviewed the patient and family, and examined the patient. The following  aspects are pertinent.  Past Medical History:  Diagnosis Date  . Arthritis   . Avascular necrosis of femur head, right ([place]) 04/23/2012  . Cellulitis of shoulder 09/28/2014  . Fracture 10/05/11   "fell and broke right hip"  . Fracture of femoral neck, right ([place]) 10/12/2011  . Glaucoma   . Glaucoma    bilateral  . Hypertension   . Polycythemia vera(238.4)   . Postoperative wound dehiscence   . Rhabdomyolysis 09/28/2014  . Thrombocytosis ([place]) 04/23/2012   Social History   Socioeconomic History  . Marital status: Widowed    Spouse name: Not on file  . Number of children: Not on  file  . Years of education: Not on file  . Highest education level: Not on file  Occupational History  . Not on file  Social Needs  . Financial resource strain: Not on file  . Food insecurity:    Worry: Not on file    Inability: Not on file  . Transportation needs:    Medical: Not on file    Non-medical: Not on file  Tobacco Use  . Smoking status: Former Smoker    Packs/day: 0.12    Types: Cigarettes    Last attempt to quit: 08/15/1991    Years since quitting: 26.8  . Smokeless tobacco: Never Used  Substance and Sexual Activity  . Alcohol use: No    Comment: 10/12/11 "did drink in my younger; don't drink at all now"  . Drug use: No  . Sexual activity: Never  Lifestyle  . Physical activity:    Days per week: Not on file    Minutes per session: Not on file  . Stress: Not on file  Relationships  . Social connections:    Talks on phone: Not on file    Gets together: Not on file    Attends religious service: Not on file    Active member of club or organization: Not on file    Attends meetings of clubs or organizations: Not on file    Relationship status: Not on file  Other Topics Concern  . Not on file  Social History Narrative  . Not on file   Family History  Problem Relation Age of Onset  . Cancer Mother   . Hypertension Father   . Asthma Sister    Scheduled Meds: . Chlorhexidine Gluconate Cloth  6 each Topical Daily  . mouth rinse  15 mL Mouth Rinse BID   Continuous Infusions: . sodium chloride Stopped (06/17/18 1014)  . anidulafungin 100 mg (06/18/18 1044)   PRN Meds:.sodium chloride, alum & mag hydroxide-simeth, diphenhydrAMINE, glycopyrrolate **OR** glycopyrrolate **OR** glycopyrrolate, haloperidol **OR** haloperidol **OR** haloperidol lactate, HYDROmorphone (DILAUDID) injection, LORazepam **OR** LORazepam **OR** LORazepam, menthol-cetylpyridinium **OR** phenol, metoCLOPramide **OR** metoCLOPramide (REGLAN) injection, ondansetron **OR** ondansetron (ZOFRAN)  IV Medications Prior to Admission:  Prior to Admission medications   Medication Sig Start Date End Date Taking? Authorizing Provider  ALPRAZolam (XANAX) 0.25 MG tablet Take 1 tablet (0.25 mg total) by mouth 2 (two) times daily as needed for anxiety. 04/17/18  Yes Mikhail, Maryann, DO  amLODipine (NORVASC) 10 MG tablet Take 10 mg by mouth daily.   Yes [provider]  amoxicillin-clavulanate (AUGMENTIN) 500-125 MG tablet Take 1 tablet (500 mg total) by mouth 2 (two) times daily. 06/07/18  Yes Michel Bickers, MD  anagrelide (AGRYLIN) 1 MG capsule Take 1 capsule (1 mg total) by mouth 2 (two) times daily. 05/06/18  Yes Joanell Rising K, PA-C  benazepril (LOTENSIN) 40  MG tablet Take 40 mg by mouth daily.   Yes [provider]  Brinzolamide-Brimonidine [facility] OP) Apply 1 drop to eye 2 (two) times daily.   Yes [provider]  cefTAZidime (FORTAZ) IVPB Inject 2 g into the vein every 12 (twelve) hours. Indication:  prosthetic hip infection Last Day of Therapy:  06/06/18 Labs - Biweekly: BMP with GFR Labs - Weekly: CBC/D, CRP, ESR 05/28/18 2018-08-02 Yes Michel Bickers, MD  ENSURE (ENSURE) Take 237 mLs by mouth 3 (three) times daily between meals.   Yes [provider]  hydroxyurea (HYDREA) 500 MG capsule Take 1 tablet ('500mg'$ ) every MWF. Take 2 tablets (1,'000mg'$ ) all other days Patient taking differently: Take 500 mg by mouth See admin instructions. Take 1 capsule ('500mg'$ ) every MWF. 11/01/17  Yes Sherrill, Izola Price, MD  Latanoprostene Bunod (VYZULTA) 0.024 % SOLN Place 1 drop into both eyes every evening.   Yes [provider]  ondansetron (ZOFRAN) 4 MG tablet Take 4 mg by mouth every 8 (eight) hours as needed for nausea or vomiting.   Yes [provider]  oxyCODONE-acetaminophen (PERCOCET/ROXICET) 5-325 MG tablet Take 1 tablet by mouth every 4 (four) hours as needed for severe pain. 04/24/18  Yes Joanell Rising K, PA-C  rosuvastatin (CRESTOR) 5 MG tablet  Take 5 mg by mouth every evening.   Yes [provider]  sodium chloride flush 0.9 % SOLN injection Inject 10 mLs into the vein every 12 (twelve) hours.   Yes [provider]  timolol (BETIMOL) 0.5 % ophthalmic solution Place 1 drop into both eyes 2 (two) times daily.   Yes [provider]  TRAVATAN Z 0.004 % SOLN ophthalmic solution Place 1 drop into both eyes at bedtime. 05/26/15  Yes [provider]  vitamin C (ASCORBIC ACID) 500 MG tablet Take 500 mg by mouth daily.   Yes [provider]  loratadine (CLARITIN) 10 MG tablet Take 10 mg by mouth daily as needed for allergies.    [provider]  meclizine (ANTIVERT) 25 MG tablet Take 25 mg by mouth 3 (three) times daily as needed for dizziness.  03/25/14   [provider]  tiZANidine (ZANAFLEX) 2 MG tablet Take 1 tablet (2 mg total) by mouth every 6 (six) hours as needed. 04/24/18   Leighton Parody, PA-C   No Known Allergies Review of Systems Unable to obtain  Physical Exam  General: Lying in bed.  Eyes closed.  Will intermittently answer some questions but no insight into her condition Heart: Tachycardic. No murmur appreciated. Lungs: Diminished air movement, scattered rhonchi.  Upper airway sounds concerning for gross aspiration Abdomen: Soft, nontender, nondistended, positive bowel sounds.  Ext: + edema (UE L>R) Skin: Warm, desquamating rash on chest, face, and neck.  Bullae on arms bilaterally  Vital Signs: BP 121/68 (BP Location: Left Leg)   Pulse (!) 102   Temp 97.7 F (36.5 C)   Resp 14   Ht '5\' 4"'$  (1.626 m)   Wt 71.1 kg   SpO2 93%   BMI 26.91 kg/m  Pain Scale: 0-10 POSS *See Group Information*: S-Acceptable,Sleep, easy to arouse Pain Score: Asleep   SpO2: SpO2: 93 % O2 Device:SpO2: 93 % O2 Flow Rate: .O2 Flow Rate (L/min): 2 L/min  IO: Intake/output summary:   Intake/Output Summary (Last 24 hours) at 06/18/2018 2134 Last data filed at 06/18/2018  1710 Gross per 24 hour  Intake 365 ml  Output 0 ml  Net 365 ml    LBM: Last  BM Date: 06/18/18 Baseline Weight: Weight: 55.8 kg Most recent weight: Weight: 71.1 kg     Palliative Assessment/Data:     Time In: 1700 Time Out: 1820 Time Total: 80 Greater than 50%  of this time was spent counseling and coordinating care related to the above assessment and plan.  Signed by: Micheline Rough, MD   Please contact Palliative Medicine Team phone at [phone] for questions and concerns.  For individual provider: See Shea Evans

## 2018-06-18 NOTE — Progress Notes (Signed)
Patient ID: MARIENA MEARES, female   DOB: [birth year], 77 y.o.   MRN: [id number]  Subjective: Spoke with patient at bedside this morning she is comfortable and reports no pain unless she is moved.  Night nurse reports that when they did move the patient last evening large amount of drainage serosanguineous came out of the opening in the right hip wound.  Patient is presently on palliative care for comfort, antibiotics were discontinued yesterday.Palliative care consult is pending.  Objective: Ms. Jeannetta Ellis is sleepy but is arousable, she answers about 25% of questions. The right hip dressing this morning is clean and dry.  If you attempt to move the right lower extremity does cause him some discomfort her toes are pink and well perfused.    Assessment: Status post right hip resection arthroplasty for removal of infected hardware.  Condition is complicated by polycythemia vera with chronically high white counts of 30-50,000 cells, 1.5 million platelets the do not work, and inability to heal her surgical wound 3.  Plan: Situation has been discussed with the patient and her family they would like to proceed with hospice and palliative care.  We have discontinued antibiotics.  Awaiting input from the palliative care team.  We will continued dressing changes.  Frederik Pear [phone]

## 2018-06-18 NOTE — Progress Notes (Signed)
Patient now transition to palliative care, hospitalist will sign off. Thank you for allowing [place] to participate in this patient's care.

## 2018-06-18 NOTE — Progress Notes (Signed)
Palliative Medicine Team consult was received. Sister to return to the hospital this evening between 4:30 and 5:30 for family meeting.  If there are urgent needs or questions please call [phone]. Thank you for consulting out team to assist with this patients care.  Micheline Rough, MD [place] Team [phone]

## 2018-06-18 NOTE — Progress Notes (Addendum)
MEWS score 3- This is No acute change in patient's status. She is a DNR and is awaiting Palliative Care consult- order placed.

## 2018-06-19 MED ORDER — HEPARIN SOD (PORK) LOCK FLUSH 100 UNIT/ML IV SOLN
250.0000 [IU] | INTRAVENOUS | Status: AC | PRN
  Administered 2018-06-19: 250 [IU]

## 2018-06-19 NOTE — Progress Notes (Addendum)
D.C summary sent to [phone]. [facility] arranged for transport.  Nurse given the  number to call report.   CSW updated patient sister-Ms. Jordan/ Inform her the patient left the building.   Kathrin Greathouse, Marlinda Mike, MSW Clinical Social Worker  [phone] 06/19/2018  1:23 PM

## 2018-06-19 NOTE — Plan of Care (Signed)
  Problem: Health Behavior/Discharge Planning: Goal: Ability to manage health-related needs will improve Outcome: Progressing   Problem: Clinical Measurements: Goal: Ability to maintain clinical measurements within normal limits will improve Outcome: Progressing Goal: Will remain free from infection Outcome: Progressing Goal: Diagnostic test results will improve Outcome: Progressing   Problem: Activity: Goal: Risk for activity intolerance will decrease Outcome: Progressing   Problem: Coping: Goal: Level of anxiety will decrease Outcome: Progressing   Problem: Elimination: Goal: Will not experience complications related to bowel motility Outcome: Progressing Goal: Will not experience complications related to urinary retention Outcome: Progressing   Problem: Pain Managment: Goal: General experience of comfort will improve Outcome: Progressing   Problem: Safety: Goal: Ability to remain free from injury will improve Outcome: Progressing   Problem: Skin Integrity: Goal: Risk for impaired skin integrity will decrease Outcome: Progressing   

## 2018-06-19 NOTE — Progress Notes (Signed)
[name]  MRN: [id number]  DOB/AGE:  11/24/1940 / 77 y.o.  15 Days Post-Op Procedure(s) (LRB): WOUND EXPLORATION evacuation hematoma (Right)    PROGRESS NOTE Subjective: Patient is alert, oriented, no Nausea, no Vomiting, . Taking PO with sips. Denies SOB, Chest or Calf Pain. Using Incentive Spirometer, PAS in place. Patient reports pain as  minimal .    Objective: Vital signs in last 24 hours: Vitals:   06/17/18 2205 06/18/18 0645 06/18/18 1325 06/18/18 2159  BP:  (!) 144/73 121/68 125/68  Pulse: (!) 112 (!) 125 (!) 102 99  Resp:   14 13  Temp:    98.4 F (36.9 C)  TempSrc:    Oral  SpO2: 99%  93% 100%  Weight:      Height:          Intake/Output from previous day: I/O last 3 completed shifts: In: 485 [P.O.:85; I.V.:300; IV Piggyback:100] Out: 150 [Urine:150]   Intake/Output this shift: Total I/O In: 60 [P.O.:60] Out: -    LABORATORY DATA: Recent Labs    06/17/18 0514 06/18/18 0400  WBC 50.6* 47.8*  HGB 10.9* 11.0*  HCT 35.6* 36.2  PLT 238 227  NA 141 142  K 4.2 3.8  CL 114* 110  CO2 19* 19*  BUN 33* 36*  CREATININE 1.89* 1.99*  GLUCOSE 97 95  CALCIUM 8.1* 8.4*    Examination: Neurologically intact Neurovascular intact Sensation intact distally Intact pulses distally Dorsiflexion/Plantar flexion intact Incision: dressing C/D/I} XR AP&Lat of hip shows well placed\fixed THA  Assessment:   15 Days Post-Op Procedure(s) (LRB): WOUND EXPLORATION evacuation hematoma (Right) ADDITIONAL DIAGNOSIS:  Expected Acute Blood Loss Anemia, tri-clonal polycythemia vera.  Plan: PT/OT WBAT, THA  DVT Prophylaxis: SCDx72 hrs, ASA 81 mg BID x 2 weeks  DISCHARGE PLAN: hospice  We appreciate the help of the palliative care team.

## 2018-06-19 NOTE — Discharge Summary (Signed)
Alejandra [name] ID: Alejandra [name] MRN: [id number] DOB/AGE: [birth year] 78 y.o.  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 Arthritis   . Avascular necrosis of femur head, right ([place]) 04/23/2012  . Cellulitis of shoulder 09/28/2014  . Fracture 10/05/11   "fell and broke right hip"  . Fracture of femoral neck, right ([place]) 10/12/2011  . Glaucoma   . Glaucoma    bilateral  . Hypertension   . Polycythemia vera(238.4)   . Postoperative wound dehiscence   . Rhabdomyolysis 09/28/2014  . Thrombocytosis ([place]) 04/23/2012    Surgeries: Procedure(s): WOUND EXPLORATION evacuation hematoma on 06/04/2018   Consultants: Treatment Team:  Ladell Pier, MD  Discharged Condition: Improved  Hospital Course: [name] is an 77 y.o. female who was admitted 06/03/2018 for operative treatment of<principal problem not specified>. Alejandra [name] has severe unremitting pain that affects sleep, daily activities, and work/hobbies. After pre-op clearance the Alejandra [name] was taken to the operating room on 06/04/2018 and underwent  Procedure(s): WOUND EXPLORATION evacuation hematoma.    Alejandra [name] was given perioperative antibiotics:  Anti-infectives (From admission, onward)   Start     Dose/Rate Route Frequency Ordered Stop   06/14/18 1000  anidulafungin (ERAXIS) 100 mg in sodium chloride 0.9 % 100 mL IVPB     100 mg 78 mL/hr over 100 Minutes Intravenous Every 24 hours 06/12/18 1647     06/14/18 1000  cefTAZidime (FORTAZ) 2 g in sodium chloride 0.9 % 100 mL IVPB  Status:  Discontinued     2 g 200 mL/hr over 30 Minutes Intravenous Every 24 hours 06/13/18 1226 06/17/18 1823   06/13/18 1800  anidulafungin (ERAXIS) 100 mg in sodium chloride 0.9 % 100 mL IVPB  Status:  Discontinued     100 mg 78 mL/hr over 100  Minutes Intravenous Every 24 hours 06/12/18 1606 06/12/18 1647   06/13/18 1020  vancomycin variable dose per unstable renal function (pharmacist dosing)  Status:  Discontinued      Does not apply See admin instructions 06/13/18 1020 06/14/18 0922   06/13/18 1000  anidulafungin (ERAXIS) 200 mg in sodium chloride 0.9 % 200 mL IVPB     200 mg 78 mL/hr over 200 Minutes Intravenous  Once 06/12/18 1647 06/13/18 1259   06/12/18 1700  anidulafungin (ERAXIS) 200 mg in sodium chloride 0.9 % 200 mL IVPB  Status:  Discontinued     200 mg 78 mL/hr over 200 Minutes Intravenous  Once 06/12/18 1606 06/12/18 1647   06/07/18 1400  fluconazole (DIFLUCAN) IVPB 400 mg  Status:  Discontinued     400 mg 100 mL/hr over 120 Minutes Intravenous Every 24 hours 06/07/18 0856 06/12/18 1606   06/07/18 1200  vancomycin (VANCOCIN) IVPB 750 mg/150 ml premix  Status:  Discontinued     750 mg 150 mL/hr over 60 Minutes Intravenous Every 24 hours 06/07/18 0856 06/13/18 1020   06/07/18 1000  cefTAZidime (FORTAZ) 2 g in sodium chloride 0.9 % 100 mL IVPB  Status:  Discontinued     2 g 200 mL/hr over 30 Minutes Intravenous Every 12 hours 06/07/18 0856 06/13/18 1226   06/07/18 0800  amoxicillin-clavulanate (AUGMENTIN) 500-125 MG per tablet 500 mg  Status:  Discontinued     1 tablet Oral 2 times daily 06/03/18 2224 06/05/18 1620  06/06/18 1600  fluconazole (DIFLUCAN) IVPB 200 mg  Status:  Discontinued     200 mg 100 mL/hr over 60 Minutes Intravenous Every 24 hours 06/06/18 1000 06/07/18 0856   06/06/18 1000  vancomycin (VANCOCIN) IVPB 750 mg/150 ml premix  Status:  Discontinued     750 mg 150 mL/hr over 60 Minutes Intravenous Every 36 hours 06/05/18 1114 06/07/18 0856   06/06/18 1000  cefTAZidime (FORTAZ) 2 g in sodium chloride 0.9 % 100 mL IVPB  Status:  Discontinued     2 g 200 mL/hr over 30 Minutes Intravenous Every 24 hours 06/05/18 1149 06/07/18 0856   06/05/18 1700  fluconazole (DIFLUCAN) IVPB 400 mg  Status:  Discontinued      400 mg 100 mL/hr over 120 Minutes Intravenous Every 24 hours 06/05/18 1620 06/06/18 1000   06/04/18 2000  vancomycin (VANCOCIN) IVPB 750 mg/150 ml premix  Status:  Discontinued     750 mg 150 mL/hr over 60 Minutes Intravenous Every 24 hours 06/04/18 1906 06/05/18 1114   06/04/18 0713  vancomycin variable dose per unstable renal function (pharmacist dosing)  Status:  Discontinued      Does not apply See admin instructions 06/04/18 0714 06/04/18 1916   06/03/18 2330  ceftAZIDime (FORTAZ) IVPB 2 g  Status:  Discontinued     2 g 100 mL/hr over 30 Minutes Intravenous 2 times daily 06/03/18 2224 06/03/18 2321   06/03/18 2330  cefTAZidime (FORTAZ) 2 g in sodium chloride 0.9 % 100 mL IVPB  Status:  Discontinued     2 g 200 mL/hr over 30 Minutes Intravenous Every 12 hours 06/03/18 2322 06/05/18 1149   06/03/18 2230  vancomycin IVPB  Status:  Discontinued     750 mg Intravenous Every 24 hours 06/03/18 2224 06/03/18 2323   06/03/18 1710  tobramycin (NEBCIN) powder  Status:  Discontinued       As needed 06/03/18 1812 06/03/18 1812   06/03/18 1710  vancomycin (VANCOCIN) powder  Status:  Discontinued       As needed 06/03/18 1812 06/03/18 1812   06/03/18 1530  cefTAZidime (FORTAZ) 2 g in sodium chloride 0.9 % 100 mL IVPB     2 g 200 mL/hr over 30 Minutes Intravenous  Once 06/03/18 1521 06/03/18 1629   06/03/18 1530  vancomycin (VANCOCIN) IVPB 750 mg/150 ml premix     750 mg 150 mL/hr over 60 Minutes Intravenous  Once 06/03/18 1521 06/03/18 1650       Alejandra [name] was given sequential compression devices, early ambulation, and chemoprophylaxis to prevent DVT.  Alejandra [name] benefited maximally from hospital stay and there were no complications.    Recent vital signs:  Alejandra [name] Vitals for the past 24 hrs:  BP Temp Temp src Pulse Resp SpO2  06/18/18 2159 125/68 98.4 F (36.9 C) Oral 99 13 100 %  06/18/18 1325 121/68 - - (!) 102 14 93 %     Recent laboratory studies:  Recent Labs    06/17/18 0514  06/18/18 0400  WBC 50.6* 47.8*  HGB 10.9* 11.0*  HCT 35.6* 36.2  PLT 238 227  NA 141 142  K 4.2 3.8  CL 114* 110  CO2 19* 19*  BUN 33* 36*  CREATININE 1.89* 1.99*  GLUCOSE 97 95  CALCIUM 8.1* 8.4*     Discharge Medications:   Allergies as of 06/19/2018   No Known Allergies     Medication List    STOP taking these medications   amLODipine 10 MG tablet Commonly known  as:  NORVASC   amoxicillin-clavulanate 500-125 MG tablet Commonly known as:  AUGMENTIN   anagrelide 1 MG capsule Commonly known as:  AGRYLIN   benazepril 40 MG tablet Commonly known as:  LOTENSIN   cefTAZidime  IVPB Commonly known as:  FORTAZ   ENSURE   hydroxyurea 500 MG capsule Commonly known as:  HYDREA   loratadine 10 MG tablet Commonly known as:  CLARITIN   meclizine 25 MG tablet Commonly known as:  ANTIVERT   ondansetron 4 MG tablet Commonly known as:  ZOFRAN   oxyCODONE-acetaminophen 5-325 MG tablet Commonly known as:  PERCOCET/ROXICET   rosuvastatin 5 MG tablet Commonly known as:  CRESTOR   SIMBRINZA OP   sodium chloride flush 0.9 % Soln injection   timolol 0.5 % ophthalmic solution Commonly known as:  BETIMOL   tiZANidine 2 MG tablet Commonly known as:  ZANAFLEX   TRAVATAN Z 0.004 % Soln ophthalmic solution Generic drug:  Travoprost (BAK Free)   vancomycin  IVPB   vitamin C 500 MG tablet Commonly known as:  ASCORBIC ACID   VYZULTA 0.024 % Soln Generic drug:  Latanoprostene Bunod     TAKE these medications   ALPRAZolam 0.25 MG tablet Commonly known as:  XANAX Take 1 tablet (0.25 mg total) by mouth 2 (two) times daily as needed for anxiety.       Diagnostic Studies: US Renal  Result Date: 06/15/2018 CLINICAL DATA:  Creatinine elevation. EXAM: RENAL / URINARY TRACT ULTRASOUND COMPLETE COMPARISON:  None. FINDINGS: Right Kidney: Renal measurements: 10.3 x 3.2 x 4.5 cm = volume: 79 mL . Echogenicity within normal limits. No mass or hydronephrosis visualized. Left  Kidney: Renal measurements: 8.9 x 4.4 x 4.0 cm = volume: 83.8 mL. Echogenicity within normal limits. No mass or hydronephrosis visualized. Bladder: Focal soft tissue area is present along the base of the bladder measuring 2.9 x 1.2 x 2.7 cm. Appears to be increased through transmission with this area. Bilateral pleural effusions are noted. IMPRESSION: 1. Normal sonographic appearance of the kidneys bilaterally. 2. Soft tissue region at the base of the urinary bladder. This could represent hemorrhage. Soft tissue mass is considered less likely. Area was not apparent on recent CT scan. Given the artifact from adjacent hardware, MRI of pelvis would be the next best option for further evaluation. Cystoscopy could also be considered. 3. Bilateral pleural effusions are again noted. Electronically Signed   By: San Morelle M.D.   On: 06/15/2018 11:26   Dg Chest Port 1 View  Result Date: 06/13/2018 CLINICAL DATA:  Rales, weakness EXAM: PORTABLE CHEST 1 VIEW COMPARISON:  Portable chest x-ray of 05/01/2017 FINDINGS: The lungs are not well aerated and there is haziness at both lung bases most consistent with atelectasis and effusions rolling posteriorly. Cardiomegaly is stable. There are significant degenerative changes throughout the thoracic spine and in both shoulders. IMPRESSION: 1. Haziness at both lung bases most likely reflects bilateral pleural effusions rolling posteriorly. Some volume loss is present at the lung bases. 2. Stable cardiomegaly. Electronically Signed   By: Ivar Drape M.D.   On: 06/13/2018 09:49   [place] Lt Upper Extrem Ltd Soft Tissue Non Vascular  Result Date: 06/15/2018 CLINICAL DATA:  Left arm edema EXAM: ULTRASOUND LEFT UPPER EXTREMITY LIMITED TECHNIQUE: Ultrasound examination of the upper extremity soft tissues was performed in the area of clinical concern. COMPARISON:  None FINDINGS: Severe generalized soft tissue edema in the left upper arm. No focal fluid collection. No hematoma.  No architectural distortion.  No soft tissue mass. IMPRESSION: Nonspecific edema of the left upper arm. Electronically Signed   By: Kathreen Devoid   On: 06/15/2018 15:14   Vas [place] Lower Extremity Venous (dvt)  Result Date: 06/16/2018  Lower Venous Study Indications: Edema.  Limitations: Poor ultrasound/tissue interface and Alejandra [name] cooperation, Alejandra [name] immobility. Performing Technologist: Oliver Hum RVT  Examination Guidelines: A complete evaluation includes B-mode imaging, spectral Doppler, color Doppler, and power Doppler as needed of all accessible portions of each vessel. Bilateral testing is considered an integral part of a complete examination. Limited examinations for reoccurring indications may be performed as noted.  Right Venous Findings: +---+---------------+---------+-----------+----------+-------+    CompressibilityPhasicitySpontaneityPropertiesSummary +---+---------------+---------+-----------+----------+-------+ CFVFull           Yes      Yes                          +---+---------------+---------+-----------+----------+-------+  Left Venous Findings: +---------+---------------+---------+-----------+----------+-------+          CompressibilityPhasicitySpontaneityPropertiesSummary +---------+---------------+---------+-----------+----------+-------+ CFV      Full           Yes      Yes                          +---------+---------------+---------+-----------+----------+-------+ SFJ      Full                                                 +---------+---------------+---------+-----------+----------+-------+ FV Prox  Full                                                 +---------+---------------+---------+-----------+----------+-------+ FV Mid   Full                                                 +---------+---------------+---------+-----------+----------+-------+ FV DistalFull                                                  +---------+---------------+---------+-----------+----------+-------+ PFV      Full                                                 +---------+---------------+---------+-----------+----------+-------+ POP      Full           Yes      Yes                          +---------+---------------+---------+-----------+----------+-------+ PTV      Full                                                 +---------+---------------+---------+-----------+----------+-------+  PERO     Full                                                 +---------+---------------+---------+-----------+----------+-------+    Summary: Right: No evidence of common femoral vein obstruction. Left: There is no evidence of deep vein thrombosis in the lower extremity. No cystic structure found in the popliteal fossa.  *See table(s) above for measurements and observations. Electronically signed by Curt Jews MD on 06/16/2018 at 8:49:45 AM.    Final    Vas [place] Upper Extremity Venous Duplex  Result Date: 06/17/2018 UPPER VENOUS STUDY  Indications: Swelling Limitations: Alejandra [name] immobility. Performing Technologist: Oliver Hum RVT  Examination Guidelines: A complete evaluation includes B-mode imaging, spectral Doppler, color Doppler, and power Doppler as needed of all accessible portions of each vessel. Bilateral testing is considered an integral part of a complete examination. Limited examinations for reoccurring indications may be performed as noted.  Right Findings: +----------+------------+----------+---------+-----------+-------+ RIGHT     CompressiblePropertiesPhasicitySpontaneousSummary +----------+------------+----------+---------+-----------+-------+ Subclavian    Full                 Yes       Yes            +----------+------------+----------+---------+-----------+-------+  Left Findings: +----------+------------+----------+---------+-----------+-------+ LEFT       CompressiblePropertiesPhasicitySpontaneousSummary +----------+------------+----------+---------+-----------+-------+ IJV           Full                 Yes       Yes            +----------+------------+----------+---------+-----------+-------+ Subclavian    Full                 Yes       Yes            +----------+------------+----------+---------+-----------+-------+ Axillary      Full                 Yes       Yes            +----------+------------+----------+---------+-----------+-------+ Brachial      Full                 Yes       Yes            +----------+------------+----------+---------+-----------+-------+ Radial        Full                                          +----------+------------+----------+---------+-----------+-------+ Ulnar         Full                                          +----------+------------+----------+---------+-----------+-------+ Cephalic      Full                                          +----------+------------+----------+---------+-----------+-------+ Basilic       Full                                          +----------+------------+----------+---------+-----------+-------+  Summary:  Right: No evidence of thrombosis in the subclavian.  Left: No evidence of deep vein thrombosis in the upper extremity. No evidence of superficial vein thrombosis in the upper extremity.  *See table(s) above for measurements and observations.  Diagnosing physician: Ruta Hinds MD Electronically signed by Ruta Hinds MD on 06/17/2018 at 6:56:32 PM.    Final     Disposition: Discharge disposition: [place] [facility] [place] Not Defined          Contact information for follow-up providers    Frederik Pear, MD In 2 weeks.   Specialty:  Orthopedic Surgery Contact information: [place] [address] [phone]            Contact information for after-discharge care    Destination     [facility] .   Service:  Skilled Nursing Contact information: [place] [phone]                   Signed: Joanell Rising 06/19/2018, 12:58 PM

## 2018-06-19 NOTE — Plan of Care (Signed)
  Problem: Health Behavior/Discharge Planning: Goal: Ability to manage health-related needs will improve 06/19/2018 0102 by Blase Mess, RN Outcome: Progressing 06/19/2018 0059 by Blase Mess, RN Outcome: Progressing   Problem: Clinical Measurements: Goal: Ability to maintain clinical measurements within normal limits will improve 06/19/2018 0102 by Blase Mess, RN Outcome: Progressing 06/19/2018 0059 by Blase Mess, RN Outcome: Progressing Goal: Will remain free from infection 06/19/2018 0102 by Blase Mess, RN Outcome: Progressing 06/19/2018 0059 by Blase Mess, RN Outcome: Progressing Goal: Diagnostic test results will improve 06/19/2018 0102 by Blase Mess, RN Outcome: Progressing 06/19/2018 0059 by Blase Mess, RN Outcome: Progressing   Problem: Activity: Goal: Risk for activity intolerance will decrease 06/19/2018 0102 by Blase Mess, RN Outcome: Progressing 06/19/2018 0059 by Blase Mess, RN Outcome: Progressing   Problem: Coping: Goal: Level of anxiety will decrease 06/19/2018 0102 by Blase Mess, RN Outcome: Progressing 06/19/2018 0059 by Blase Mess, RN Outcome: Progressing   Problem: Elimination: Goal: Will not experience complications related to bowel motility 06/19/2018 0102 by Blase Mess, RN Outcome: Progressing 06/19/2018 0059 by Blase Mess, RN Outcome: Progressing Goal: Will not experience complications related to urinary retention 06/19/2018 0102 by Blase Mess, RN Outcome: Progressing 06/19/2018 0059 by Blase Mess, RN Outcome: Progressing   Problem: Pain Managment: Goal: General experience of comfort will improve 06/19/2018 0102 by Blase Mess, RN Outcome: Progressing 06/19/2018 0059 by Blase Mess, RN Outcome: Progressing   Problem: Safety: Goal: Ability to remain free from injury will improve 06/19/2018 0102 by  Blase Mess, RN Outcome: Progressing 06/19/2018 0059 by Blase Mess, RN Outcome: Progressing   Problem: Skin Integrity: Goal: Risk for impaired skin integrity will decrease 06/19/2018 0102 by Blase Mess, RN Outcome: Progressing 06/19/2018 0059 by Blase Mess, RN Outcome: Progressing

## 2018-06-19 NOTE — Progress Notes (Signed)
Daily Progress Note   Patient Name: Alejandra [name]       Date: 06/19/2018 DOB: 09/21/1940  Age: 77 y.o. MRN#: [id number] Attending Physician: Frederik Pear, MD Primary Care Physician: Vincente Liberty, MD Admit Date: 06/03/2018  Reason for Consultation/Follow-up: Establishing goals of care, Non pain symptom management and Pain control  Subjective: Alejandra [name] is more awake this AM.  She is confused (cannot answer any questions appropriately) and states repeatedly "dont leave me alone.  I wont be here when you get back."  Length of Stay: 16  Current Medications: Scheduled Meds:  . Chlorhexidine Gluconate Cloth  6 each Topical Daily  . mouth rinse  15 mL Mouth Rinse BID    Continuous Infusions: . sodium chloride Stopped (06/17/18 1014)  . anidulafungin 100 mg (06/18/18 1044)    PRN Meds: sodium chloride, alum & mag hydroxide-simeth, diphenhydrAMINE, glycopyrrolate **OR** glycopyrrolate **OR** glycopyrrolate, haloperidol **OR** haloperidol **OR** haloperidol lactate, HYDROmorphone (DILAUDID) injection, LORazepam **OR** LORazepam **OR** LORazepam, menthol-cetylpyridinium **OR** phenol, metoCLOPramide **OR** metoCLOPramide (REGLAN) injection, ondansetron **OR** ondansetron (ZOFRAN) IV  Physical Exam  General: Lying in bed.  More awake but does not answer questions or have insight into her condition Heart: Tachycardic. No murmur appreciated. Lungs: Diminished air movement, scattered rhonchi.  Upper airway sounds concerning for gross aspiration Abdomen: Soft, nontender, nondistended, positive bowel sounds.  Ext: + edema (UE L>R) Skin: Warm, desquamating rash on chest, face, and neck.  Bullae on arms bilaterally. Desquamation worse today on face.  Multiple skin tears on arms noted.            Vital Signs: BP 125/68 (BP Location: Left Leg)   Pulse 99   Temp 98.4 F (36.9 C) (Oral)   Resp 13   Ht 5\' 4"  (1.626 m)   Wt 71.1 kg   SpO2 100%   BMI 26.91 kg/m  SpO2: SpO2: 100 % O2 Device: O2 Device: Room Air O2 Flow Rate: O2 Flow Rate (L/min): 2 L/min  Intake/output summary:   Intake/Output Summary (Last 24 hours) at 06/19/2018 1010 Last data filed at 06/19/2018 0200 Gross per 24 hour  Intake 340 ml  Output 150 ml  Net 190 ml   LBM: Last BM Date: 06/18/18 Baseline Weight: Weight: 55.8 kg Most recent weight: Weight: 71.1 kg       Palliative Assessment/Data:  Patient Active Problem List   Diagnosis Date Noted  . Drug reaction   . Pressure injury of skin 06/05/2018  . Infection of prosthetic total hip joint ([place]) 05/28/2018  . Postoperative wound dehiscence 04/25/2018  . Malnutrition of moderate degree 04/24/2018  . History of total hip arthroplasty, right 04/23/2018  . Hematoma of right hip 04/15/2018  . Right hip pain 04/13/2018  . Fall at home, initial encounter 04/13/2018  . AKI (acute kidney injury) ([place]) 04/13/2018  . Elevated troponin 04/13/2018  . Possible Acute lower UTI 04/13/2018  . Sepsis ([place]) 04/12/2018  . UTI (urinary tract infection) 04/12/2018  . Primary osteoarthritis of right hip 02/04/2018  . Failed total hip arthroplasty ([place]) 02/01/2018  . Pre-operative cardiovascular examination 01/31/2018  . Abnormal EKG 01/31/2018  . Vertigo 09/28/2014  . Essential hypertension 09/28/2014  . Thrombocytosis ([place]) 04/23/2012  . Polycythemia vera ([place]) 12/22/2011    Palliative Care Assessment & Plan   Patient Profile: 77 y.o. female  with past medical history of polycythemia vera admitted on 06/03/2018 with infected hip s/p removal of hardware and spacer placement.  Her hospital course has been complicated by her myeloproliferative disorder and she has continued clinical decline.  Palliative consulted for [facility].  Recommendations/Plan:  FULL  COMFORT CARE  Plan for transition to [facility] if bed is available and she is accepted.  Goals of Care and Additional Recommendations:  Limitations on Scope of Treatment: Full Comfort Care  Code Status:    Code Status Orders  (From admission, onward)         Start     Ordered   06/18/18 1759  Do not attempt resuscitation (DNR)  Continuous    Question Answer Comment  In the event of cardiac or respiratory ARREST Do not call a "code blue"   In the event of cardiac or respiratory ARREST Do not perform Intubation, CPR, defibrillation or ACLS   In the event of cardiac or respiratory ARREST Use medication by any route, position, wound care, and other measures to relive pain and suffering. May use oxygen, suction and manual treatment of airway obstruction as needed for comfort.      06/18/18 1803        Code Status History    Date Active Date Inactive Code Status Order ID Comments User Context   06/16/2018 1524 06/18/2018 1803 DNR [id number]  Florencia Reasons, MD Inpatient   06/03/2018 2224 06/16/2018 1524 Full Code [id number]  Leighton Parody, PA-C Inpatient   04/23/2018 1227 04/24/2018 1813 Full Code [id number]  Leighton Parody, PA-C Inpatient   04/12/2018 1432 04/17/2018 1941 Full Code [id number]  Mendel Corning, MD Inpatient   02/04/2018 1824 02/12/2018 1820 Full Code [id number]  Leighton Parody, PA-C Inpatient   09/28/2014 0136 09/29/2014 1602 Partial Code [id number]  Ivor Costa, MD Inpatient   10/12/2011 1510 10/13/2011 0054 Full Code [id number]  Johnny Bridge, MD Inpatient       Prognosis:   < 2 weeks- Alejandra [name] has multiple complications related to her polycythemia vera including worsening renal function.  She has been transitioned to full comfort including discontinuation of abx for hip infection s/p hardware removal.  She is likely to develop sepsis and now has also developed a desquamating skin condition (nursing report tape now pulling skin completely off- ? if appears to be SJS type  syndrome).  I am very concerned her care needs and discomfort will significantly escalate (particularly related to skin sloughing). She would certainly be best  served by residential hospice if it can be arranged.  Discharge Planning:  Hospice facility  Care plan was discussed with RN  Thank you for allowing the Palliative Medicine Team to assist in the care of this patient.   Total Time 30 Prolonged Time Billed No      Greater than 50%  of this time was spent counseling and coordinating care related to the above assessment and plan.  Micheline Rough, MD  Please contact Palliative Medicine Team phone at [phone] for questions and concerns.

## 2018-06-19 NOTE — Clinical Social Work Note (Addendum)
Clinical Social Work Assessment  Patient Details  Name: Alejandra [name] MRN: [id number] Date of Birth: [birth year]  Date of referral:  06/19/18               Reason for consult:  Discharge Planning                Permission sought to share information with:    Permission granted to share information::  Yes, Verbal Permission Granted  Name::        Agency::  SNF  Relationship::  Sister(HCPOA)  Contact Information:     Housing/Transportation Living arrangements for the past 2 months:  [place] of Information:  Siblings Patient Interpreter Needed:  None Criminal Activity/Legal Involvement Pertinent to Current Situation/Hospitalization:  No - Comment as needed Significant Relationships:  Siblings Lives with:  Facility Resident Do you feel safe going back to the place where you live?  No Need for family participation in patient care:  Yes (Comment)  Care giving concerns:   Residential Hospice/[facility]  Social Worker assessment / plan: CSW discussed Hospice placement with the patient sister. Patient sister still agreeable to BP. CSW made referral to the  liaison Harmon Pier. Patient clinical information currently under review.  Patient will transport by [facility].   Employment status:  Retired Forensic scientist:  Managed Care PT Recommendations:  Not assessed at this time Information / Referral to community resources:  [place]  Patient/Family's Response to care:  Agreeable to transition to comfort care.   Patient/Family's Understanding of and Emotional Response to Diagnosis, Current Treatment, and Prognosis:  Patient sister met with palliative physician yesterday to discuss and learn about the patient prognosis.   Emotional Assessment Appearance:  Appears stated age Attitude/Demeanor/Rapport:    Affect (typically observed):  Calm Orientation:    Alcohol / Substance use:  Not Applicable Psych involvement (Current and /or  in the community):  No (Comment)  Discharge Needs  Concerns to be addressed:    Readmission within the last 30 days:  No Current discharge risk:  Terminally ill Barriers to Discharge:      Lia Hopping, LCSW 06/19/2018, 9:20 AM

## 2018-06-19 NOTE — Progress Notes (Addendum)
[facility]  Received request from [place] this morning for patient/family interest in [facility] after discharge. Chart reviewed and eligibility confirmed by [facility] physician. Met with patient's sister Basilia Jumbo and brother Percell Miller to explain services, confirm interest, complete paper work and offer support. Dr. Orpah Melter to assume care at [facility]. Appreciate report from Dr. Domingo Cocking. CSW Elmyra Ricks aware paper work has been completed with plan for transfer today.   Spoke with RN to request that PICC line be left in for possible use at [facility].   Please send discharge summary to [phone].  RN please call report to [phone].  Thank you,  Erling Conte, [facility] Team listed on [place] for daily coverage.

## 2018-06-19 NOTE — Discharge Summary (Deleted)
[name] MRN: [id number] DOB/AGE: 1941/01/02 78 y.o.  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 Arthritis   . Avascular necrosis of femur head, right ([place]) 04/23/2012  . Cellulitis of shoulder 09/28/2014  . Fracture 10/05/11   "fell and broke right hip"  . Fracture of femoral neck, right ([place]) 10/12/2011  . Glaucoma   . Glaucoma    bilateral  . Hypertension   . Polycythemia vera(238.4)   . Postoperative wound dehiscence   . Rhabdomyolysis 09/28/2014  . Thrombocytosis ([place]) 04/23/2012    Surgeries: Procedure(s): WOUND EXPLORATION evacuation hematoma on 06/04/2018   Consultants: Treatment Team:  Ladell Pier, MD  Discharged Condition: Improved  Hospital Course: Alejandra [name] is an 77 y.o. female who was admitted 06/03/2018 for operative treatment of<principal problem not specified>. Patient has severe unremitting pain that affects sleep, daily activities, and work/hobbies. After pre-op clearance the patient was taken to the operating room on 06/04/2018 and underwent  Procedure(s): WOUND EXPLORATION evacuation hematoma.    Patient was given perioperative antibiotics:  Anti-infectives (From admission, onward)   Start     Dose/Rate Route Frequency Ordered Stop   06/14/18 1000  anidulafungin (ERAXIS) 100 mg in sodium chloride 0.9 % 100 mL IVPB     100 mg 78 mL/hr over 100 Minutes Intravenous Every 24 hours 06/12/18 1647     06/14/18 1000  cefTAZidime (FORTAZ) 2 g in sodium chloride 0.9 % 100 mL IVPB  Status:  Discontinued     2 g 200 mL/hr over 30 Minutes Intravenous Every 24 hours 06/13/18 1226 06/17/18 1823   06/13/18 1800  anidulafungin (ERAXIS) 100 mg in sodium chloride 0.9 % 100 mL IVPB  Status:  Discontinued     100 mg 78 mL/hr over 100  Minutes Intravenous Every 24 hours 06/12/18 1606 06/12/18 1647   06/13/18 1020  vancomycin variable dose per unstable renal function (pharmacist dosing)  Status:  Discontinued      Does not apply See admin instructions 06/13/18 1020 06/14/18 0922   06/13/18 1000  anidulafungin (ERAXIS) 200 mg in sodium chloride 0.9 % 200 mL IVPB     200 mg 78 mL/hr over 200 Minutes Intravenous  Once 06/12/18 1647 06/13/18 1259   06/12/18 1700  anidulafungin (ERAXIS) 200 mg in sodium chloride 0.9 % 200 mL IVPB  Status:  Discontinued     200 mg 78 mL/hr over 200 Minutes Intravenous  Once 06/12/18 1606 06/12/18 1647   06/07/18 1400  fluconazole (DIFLUCAN) IVPB 400 mg  Status:  Discontinued     400 mg 100 mL/hr over 120 Minutes Intravenous Every 24 hours 06/07/18 0856 06/12/18 1606   06/07/18 1200  vancomycin (VANCOCIN) IVPB 750 mg/150 ml premix  Status:  Discontinued     750 mg 150 mL/hr over 60 Minutes Intravenous Every 24 hours 06/07/18 0856 06/13/18 1020   06/07/18 1000  cefTAZidime (FORTAZ) 2 g in sodium chloride 0.9 % 100 mL IVPB  Status:  Discontinued     2 g 200 mL/hr over 30 Minutes Intravenous Every 12 hours 06/07/18 0856 06/13/18 1226   06/07/18 0800  amoxicillin-clavulanate (AUGMENTIN) 500-125 MG per tablet 500 mg  Status:  Discontinued     1 tablet Oral 2 times daily 06/03/18 2224 06/05/18 1620  06/06/18 1600  fluconazole (DIFLUCAN) IVPB 200 mg  Status:  Discontinued     200 mg 100 mL/hr over 60 Minutes Intravenous Every 24 hours 06/06/18 1000 06/07/18 0856   06/06/18 1000  vancomycin (VANCOCIN) IVPB 750 mg/150 ml premix  Status:  Discontinued     750 mg 150 mL/hr over 60 Minutes Intravenous Every 36 hours 06/05/18 1114 06/07/18 0856   06/06/18 1000  cefTAZidime (FORTAZ) 2 g in sodium chloride 0.9 % 100 mL IVPB  Status:  Discontinued     2 g 200 mL/hr over 30 Minutes Intravenous Every 24 hours 06/05/18 1149 06/07/18 0856   06/05/18 1700  fluconazole (DIFLUCAN) IVPB 400 mg  Status:  Discontinued      400 mg 100 mL/hr over 120 Minutes Intravenous Every 24 hours 06/05/18 1620 06/06/18 1000   06/04/18 2000  vancomycin (VANCOCIN) IVPB 750 mg/150 ml premix  Status:  Discontinued     750 mg 150 mL/hr over 60 Minutes Intravenous Every 24 hours 06/04/18 1906 06/05/18 1114   06/04/18 0713  vancomycin variable dose per unstable renal function (pharmacist dosing)  Status:  Discontinued      Does not apply See admin instructions 06/04/18 0714 06/04/18 1916   06/03/18 2330  ceftAZIDime (FORTAZ) IVPB 2 g  Status:  Discontinued     2 g 100 mL/hr over 30 Minutes Intravenous 2 times daily 06/03/18 2224 06/03/18 2321   06/03/18 2330  cefTAZidime (FORTAZ) 2 g in sodium chloride 0.9 % 100 mL IVPB  Status:  Discontinued     2 g 200 mL/hr over 30 Minutes Intravenous Every 12 hours 06/03/18 2322 06/05/18 1149   06/03/18 2230  vancomycin IVPB  Status:  Discontinued     750 mg Intravenous Every 24 hours 06/03/18 2224 06/03/18 2323   06/03/18 1710  tobramycin (NEBCIN) powder  Status:  Discontinued       As needed 06/03/18 1812 06/03/18 1812   06/03/18 1710  vancomycin (VANCOCIN) powder  Status:  Discontinued       As needed 06/03/18 1812 06/03/18 1812   06/03/18 1530  cefTAZidime (FORTAZ) 2 g in sodium chloride 0.9 % 100 mL IVPB     2 g 200 mL/hr over 30 Minutes Intravenous  Once 06/03/18 1521 06/03/18 1629   06/03/18 1530  vancomycin (VANCOCIN) IVPB 750 mg/150 ml premix     750 mg 150 mL/hr over 60 Minutes Intravenous  Once 06/03/18 1521 06/03/18 1650       Patient was given sequential compression devices, early ambulation, and chemoprophylaxis to prevent DVT.  Patient benefited maximally from hospital stay and there were no complications.    Recent vital signs:  Patient Vitals for the past 24 hrs:  BP Temp Temp src Pulse Resp SpO2  06/18/18 2159 125/68 98.4 F (36.9 C) Oral 99 13 100 %  06/18/18 1325 121/68 - - (!) 102 14 93 %     Recent laboratory studies:  Recent Labs    06/17/18 0514  06/18/18 0400  WBC 50.6* 47.8*  HGB 10.9* 11.0*  HCT 35.6* 36.2  PLT 238 227  NA 141 142  K 4.2 3.8  CL 114* 110  CO2 19* 19*  BUN 33* 36*  CREATININE 1.89* 1.99*  GLUCOSE 97 95  CALCIUM 8.1* 8.4*     Discharge Medications:   Allergies as of 06/19/2018   No Known Allergies     Medication List    STOP taking these medications   amoxicillin-clavulanate 500-125 MG tablet Commonly known  as:  AUGMENTIN   cefTAZidime  IVPB Commonly known as:  FORTAZ   vancomycin  IVPB     TAKE these medications   ALPRAZolam 0.25 MG tablet Commonly known as:  XANAX Take 1 tablet (0.25 mg total) by mouth 2 (two) times daily as needed for anxiety.   amLODipine 10 MG tablet Commonly known as:  NORVASC Take 10 mg by mouth daily.   anagrelide 1 MG capsule Commonly known as:  AGRYLIN Take 1 capsule (1 mg total) by mouth 2 (two) times daily.   benazepril 40 MG tablet Commonly known as:  LOTENSIN Take 40 mg by mouth daily.   ENSURE Take 237 mLs by mouth 3 (three) times daily between meals.   hydroxyurea 500 MG capsule Commonly known as:  HYDREA Take 1 tablet (500mg ) every MWF. Take 2 tablets (1,000mg ) all other days What changed:    how much to take  how to take this  when to take this  additional instructions   loratadine 10 MG tablet Commonly known as:  CLARITIN Take 10 mg by mouth daily as needed for allergies.   meclizine 25 MG tablet Commonly known as:  ANTIVERT Take 25 mg by mouth 3 (three) times daily as needed for dizziness.   ondansetron 4 MG tablet Commonly known as:  ZOFRAN Take 4 mg by mouth every 8 (eight) hours as needed for nausea or vomiting.   oxyCODONE-acetaminophen 5-325 MG tablet Commonly known as:  PERCOCET/ROXICET Take 1 tablet by mouth every 4 (four) hours as needed for severe pain.   rosuvastatin 5 MG tablet Commonly known as:  CRESTOR Take 5 mg by mouth every evening.   SIMBRINZA OP Apply 1 drop to eye 2 (two) times daily.   sodium  chloride flush 0.9 % Soln injection Inject 10 mLs into the vein every 12 (twelve) hours.   timolol 0.5 % ophthalmic solution Commonly known as:  BETIMOL Place 1 drop into both eyes 2 (two) times daily.   tiZANidine 2 MG tablet Commonly known as:  ZANAFLEX Take 1 tablet (2 mg total) by mouth every 6 (six) hours as needed.   TRAVATAN Z 0.004 % Soln ophthalmic solution Generic drug:  Travoprost (BAK Free) Place 1 drop into both eyes at bedtime.   vitamin C 500 MG tablet Commonly known as:  ASCORBIC ACID Take 500 mg by mouth daily.   VYZULTA 0.024 % Soln Generic drug:  Latanoprostene Bunod Place 1 drop into both eyes every evening.       Diagnostic Studies: US Renal  Result Date: 06/15/2018 CLINICAL DATA:  Creatinine elevation. EXAM: RENAL / URINARY TRACT ULTRASOUND COMPLETE COMPARISON:  None. FINDINGS: Right Kidney: Renal measurements: 10.3 x 3.2 x 4.5 cm = volume: 79 mL . Echogenicity within normal limits. No mass or hydronephrosis visualized. Left Kidney: Renal measurements: 8.9 x 4.4 x 4.0 cm = volume: 83.8 mL. Echogenicity within normal limits. No mass or hydronephrosis visualized. Bladder: Focal soft tissue area is present along the base of the bladder measuring 2.9 x 1.2 x 2.7 cm. Appears to be increased through transmission with this area. Bilateral pleural effusions are noted. IMPRESSION: 1. Normal sonographic appearance of the kidneys bilaterally. 2. Soft tissue region at the base of the urinary bladder. This could represent hemorrhage. Soft tissue mass is considered less likely. Area was not apparent on recent CT scan. Given the artifact from adjacent hardware, MRI of pelvis would be the next best option for further evaluation. Cystoscopy could also be considered. 3. Bilateral pleural  effusions are again noted. Electronically Signed   By: San Morelle M.D.   On: 06/15/2018 11:26   Dg Chest Port 1 View  Result Date: 06/13/2018 CLINICAL DATA:  Rales, weakness EXAM:  PORTABLE CHEST 1 VIEW COMPARISON:  Portable chest x-ray of 05/01/2017 FINDINGS: The lungs are not well aerated and there is haziness at both lung bases most consistent with atelectasis and effusions rolling posteriorly. Cardiomegaly is stable. There are significant degenerative changes throughout the thoracic spine and in both shoulders. IMPRESSION: 1. Haziness at both lung bases most likely reflects bilateral pleural effusions rolling posteriorly. Some volume loss is present at the lung bases. 2. Stable cardiomegaly. Electronically Signed   By: Ivar Drape M.D.   On: 06/13/2018 09:49   [place] Lt Upper Extrem Ltd Soft Tissue Non Vascular  Result Date: 06/15/2018 CLINICAL DATA:  Left arm edema EXAM: ULTRASOUND LEFT UPPER EXTREMITY LIMITED TECHNIQUE: Ultrasound examination of the upper extremity soft tissues was performed in the area of clinical concern. COMPARISON:  None FINDINGS: Severe generalized soft tissue edema in the left upper arm. No focal fluid collection. No hematoma. No architectural distortion. No soft tissue mass. IMPRESSION: Nonspecific edema of the left upper arm. Electronically Signed   By: Kathreen Devoid   On: 06/15/2018 15:14   Vas [place] Lower Extremity Venous (dvt)  Result Date: 06/16/2018  Lower Venous Study Indications: Edema.  Limitations: Poor ultrasound/tissue interface and patient cooperation, patient immobility. Performing Technologist: Oliver Hum RVT  Examination Guidelines: A complete evaluation includes B-mode imaging, spectral Doppler, color Doppler, and power Doppler as needed of all accessible portions of each vessel. Bilateral testing is considered an integral part of a complete examination. Limited examinations for reoccurring indications may be performed as noted.  Right Venous Findings: +---+---------------+---------+-----------+----------+-------+    CompressibilityPhasicitySpontaneityPropertiesSummary +---+---------------+---------+-----------+----------+-------+  CFVFull           Yes      Yes                          +---+---------------+---------+-----------+----------+-------+  Left Venous Findings: +---------+---------------+---------+-----------+----------+-------+          CompressibilityPhasicitySpontaneityPropertiesSummary +---------+---------------+---------+-----------+----------+-------+ CFV      Full           Yes      Yes                          +---------+---------------+---------+-----------+----------+-------+ SFJ      Full                                                 +---------+---------------+---------+-----------+----------+-------+ FV Prox  Full                                                 +---------+---------------+---------+-----------+----------+-------+ FV Mid   Full                                                 +---------+---------------+---------+-----------+----------+-------+ FV DistalFull                                                 +---------+---------------+---------+-----------+----------+-------+  PFV      Full                                                 +---------+---------------+---------+-----------+----------+-------+ POP      Full           Yes      Yes                          +---------+---------------+---------+-----------+----------+-------+ PTV      Full                                                 +---------+---------------+---------+-----------+----------+-------+ PERO     Full                                                 +---------+---------------+---------+-----------+----------+-------+    Summary: Right: No evidence of common femoral vein obstruction. Left: There is no evidence of deep vein thrombosis in the lower extremity. No cystic structure found in the popliteal fossa.  *See table(s) above for measurements and observations. Electronically signed by Curt Jews MD on 06/16/2018 at 8:49:45 AM.    Final    Vas [place] Upper Extremity  Venous Duplex  Result Date: 06/17/2018 UPPER VENOUS STUDY  Indications: Swelling Limitations: Patient immobility. Performing Technologist: Oliver Hum RVT  Examination Guidelines: A complete evaluation includes B-mode imaging, spectral Doppler, color Doppler, and power Doppler as needed of all accessible portions of each vessel. Bilateral testing is considered an integral part of a complete examination. Limited examinations for reoccurring indications may be performed as noted.  Right Findings: +----------+------------+----------+---------+-----------+-------+ RIGHT     CompressiblePropertiesPhasicitySpontaneousSummary +----------+------------+----------+---------+-----------+-------+ Subclavian    Full                 Yes       Yes            +----------+------------+----------+---------+-----------+-------+  Left Findings: +----------+------------+----------+---------+-----------+-------+ LEFT      CompressiblePropertiesPhasicitySpontaneousSummary +----------+------------+----------+---------+-----------+-------+ IJV           Full                 Yes       Yes            +----------+------------+----------+---------+-----------+-------+ Subclavian    Full                 Yes       Yes            +----------+------------+----------+---------+-----------+-------+ Axillary      Full                 Yes       Yes            +----------+------------+----------+---------+-----------+-------+ Brachial      Full                 Yes       Yes            +----------+------------+----------+---------+-----------+-------+ Radial        Full                                          +----------+------------+----------+---------+-----------+-------+  Ulnar         Full                                          +----------+------------+----------+---------+-----------+-------+ Cephalic      Full                                           +----------+------------+----------+---------+-----------+-------+ Basilic       Full                                          +----------+------------+----------+---------+-----------+-------+  Summary:  Right: No evidence of thrombosis in the subclavian.  Left: No evidence of deep vein thrombosis in the upper extremity. No evidence of superficial vein thrombosis in the upper extremity.  *See table(s) above for measurements and observations.  Diagnosing physician: Ruta Hinds MD Electronically signed by Ruta Hinds MD on 06/17/2018 at 6:56:32 PM.    Final     Disposition: Discharge disposition: [place] Not Defined       Discharge Instructions    Call MD / Call 911   Complete by:  As directed    If you experience chest pain or shortness of breath, CALL 911 and be transported to the hospital emergency room.  If you develope a fever above 101 F, pus (white drainage) or increased drainage or redness at the wound, or calf pain, call your surgeon's office.   Constipation Prevention   Complete by:  As directed    Drink plenty of fluids.  Prune juice may be helpful.  You may use a stool softener, such as Colace (over the counter) 100 mg twice a day.  Use MiraLax (over the counter) for constipation as needed.   Diet - low sodium heart healthy   Complete by:  As directed    Driving restrictions   Complete by:  As directed    No driving for 2 weeks   Patient may shower   Complete by:  As directed    You may shower without a dressing once there is no drainage.  Do not wash over the wound.  If drainage remains, cover wound with plastic wrap and then shower.       Contact information for follow-up providers    Frederik Pear, MD In 2 weeks.   Specialty:  Orthopedic Surgery Contact information: [place] [address] [phone]            Contact information for after-discharge care    Destination    [facility] .    Service:  Skilled Nursing Contact information: [place] [phone]                   Signed: Joanell Rising 06/19/2018, 12:49 PM

## 2018-06-19 NOTE — Progress Notes (Signed)
Verbal Report was given to Ms Arbie Cookey RN at [facility]. Family is aware of the transfer.

## 2018-06-19 NOTE — Progress Notes (Signed)
Patient was transferred to [facility] for continuation of palliative care.

## 2018-07-04 ENCOUNTER — Inpatient Hospital Stay: Payer: Medicare HMO

## 2018-07-04 ENCOUNTER — Inpatient Hospital Stay: Payer: Medicare HMO | Admitting: Oncology

## 2018-07-10 ENCOUNTER — Ambulatory Visit: Payer: Medicaid Other | Admitting: Internal Medicine

## 2018-07-14 DEATH — deceased

## 2019-05-21 IMAGING — DX DG CHEST 1V PORT
1 series · 1 of 1 positions shown · non-contrast
Comparison: 04/12/2018

CLINICAL DATA: Persistent cough

EXAM:
PORTABLE CHEST 1 VIEW

[chest ap]
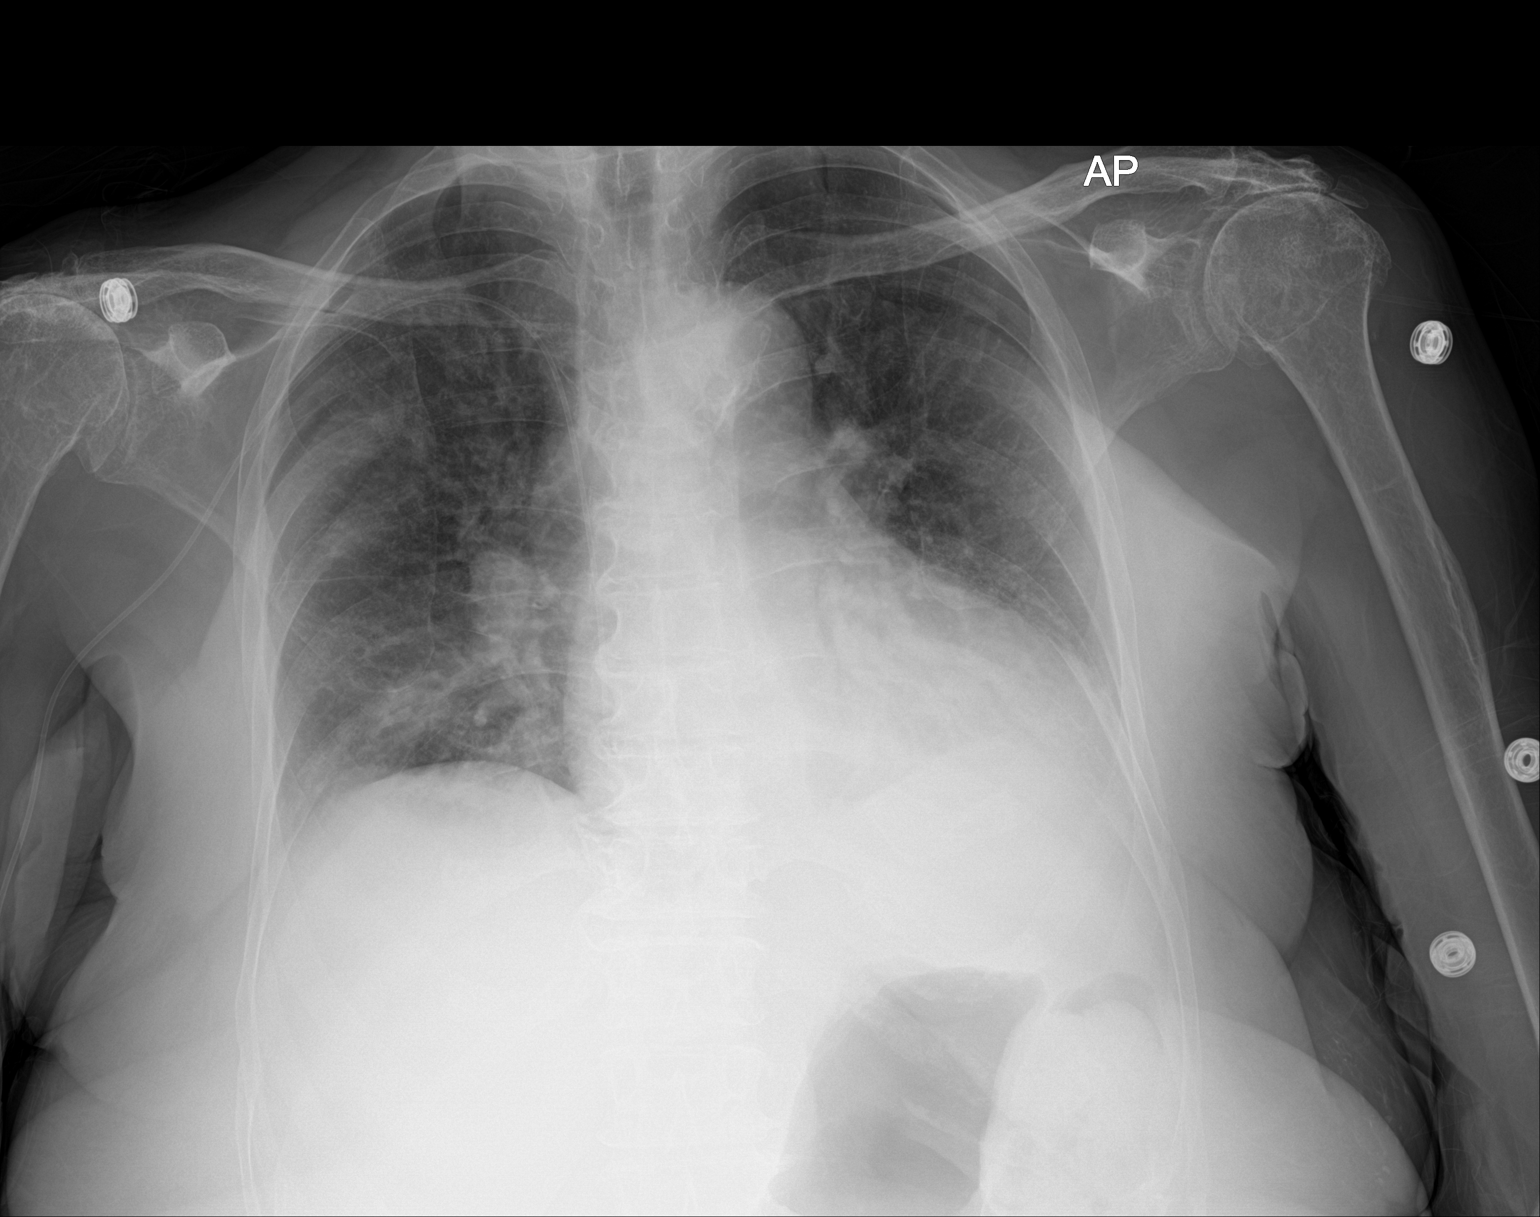

[1 of 1 positions shown; findings below may reference images not displayed]

FINDINGS: Cardiac shadow is enlarged but stable. Right-sided PICC line is
noted in the right atrium. Some patchy infiltrative changes are
throughout both lungs but particularly bases bilaterally. No acute
bony abnormality is seen.
IMPRESSION: Patchy infiltrates particularly in the bases bilaterally.

## 2019-07-03 IMAGING — DX DG CHEST 1V PORT
1 series · 1 of 1 positions shown · non-contrast
Comparison: Portable chest x-ray of 05/01/2017

CLINICAL DATA: Rales, weakness

EXAM:
PORTABLE CHEST 1 VIEW

[chest ap]
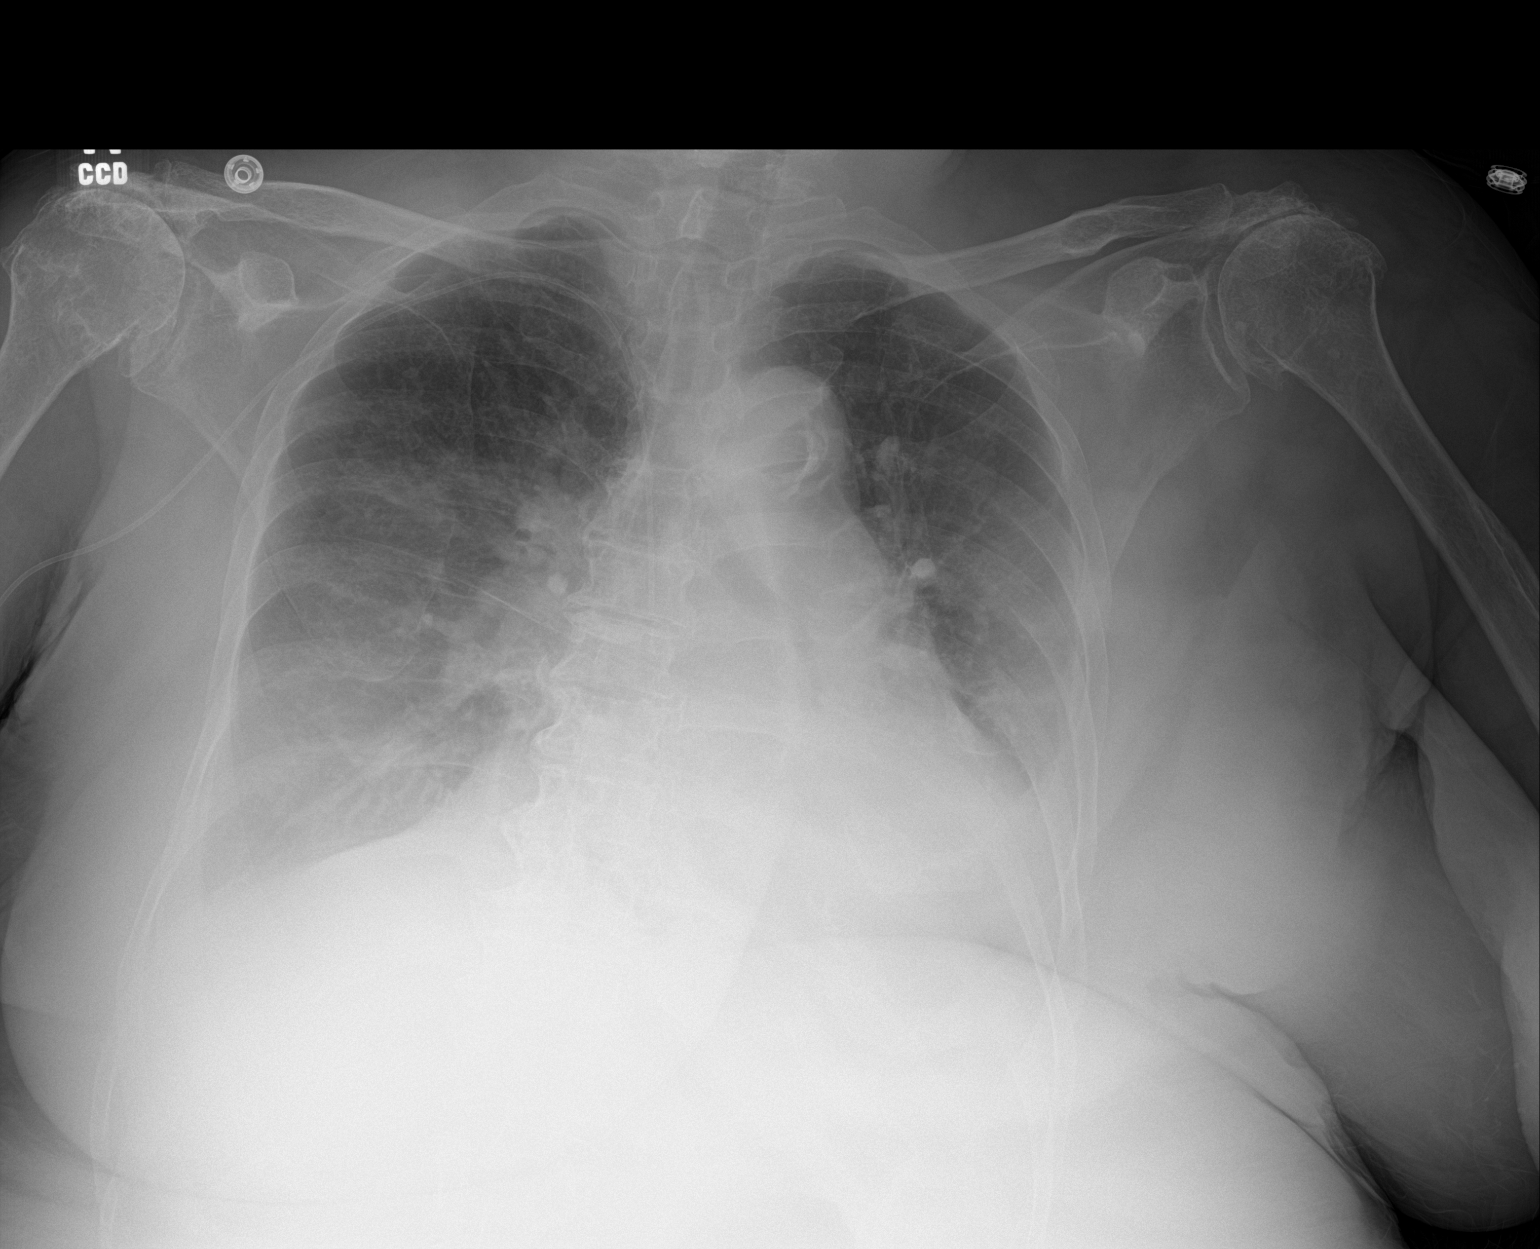

[1 of 1 positions shown; findings below may reference images not displayed]

FINDINGS: The lungs are not well aerated and there is haziness at both lung
bases most consistent with atelectasis and effusions rolling
posteriorly. Cardiomegaly is stable. There are significant
degenerative changes throughout the thoracic spine and in both
shoulders.
IMPRESSION: 1. Haziness at both lung bases most likely reflects bilateral
pleural effusions rolling posteriorly. Some volume loss is present
at the lung bases.
2. Stable cardiomegaly.

## 2019-07-05 IMAGING — US US EXTREM UP*L* LTD
1 series · 14 of 25 positions shown · non-contrast
Comparison: None

CLINICAL DATA: Left arm edema

EXAM:
ULTRASOUND LEFT UPPER EXTREMITY LIMITED
TECHNIQUE: Ultrasound examination of the upper extremity soft tissues was
performed in the area of clinical concern.

[Series 1: us extrem up*left* ltd · 14 of 48 slices shown]
[im 1/48]
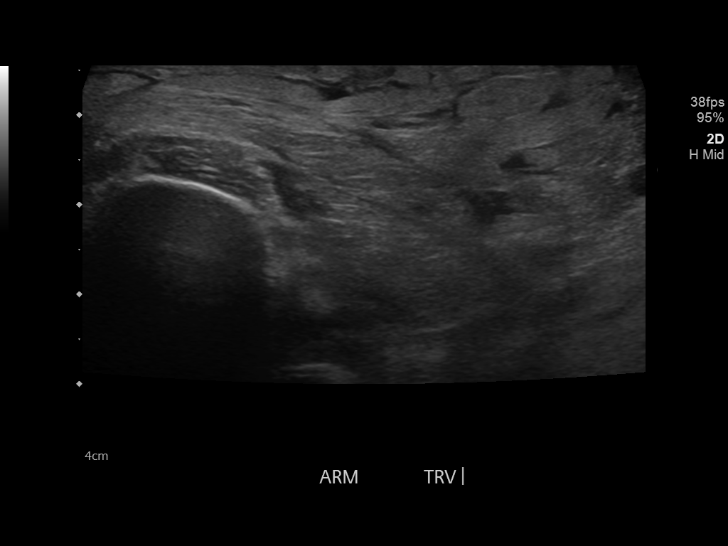
[im 4/48]
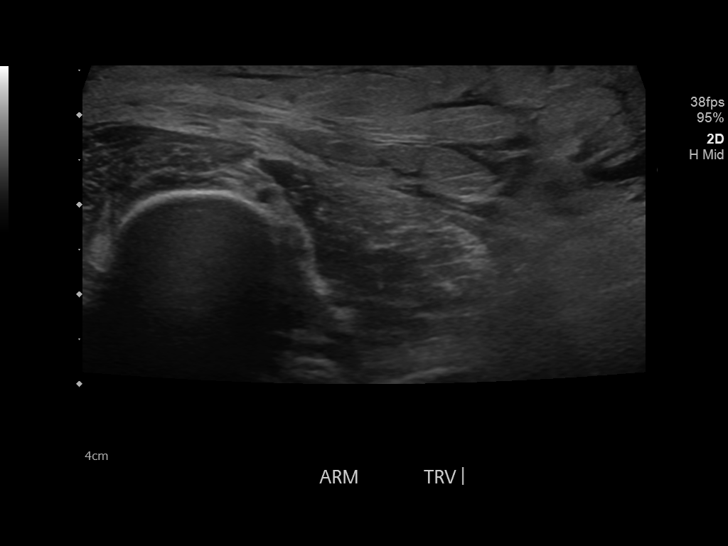
[im 8/48]
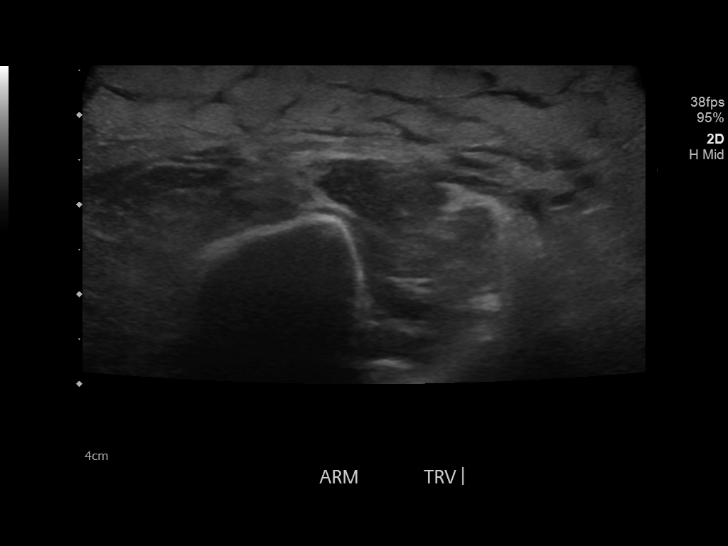
[im 12/48]
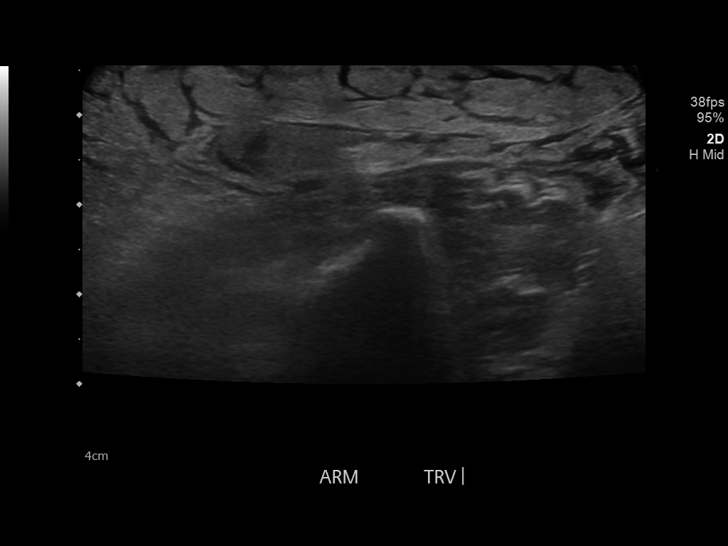
[im 16/48]
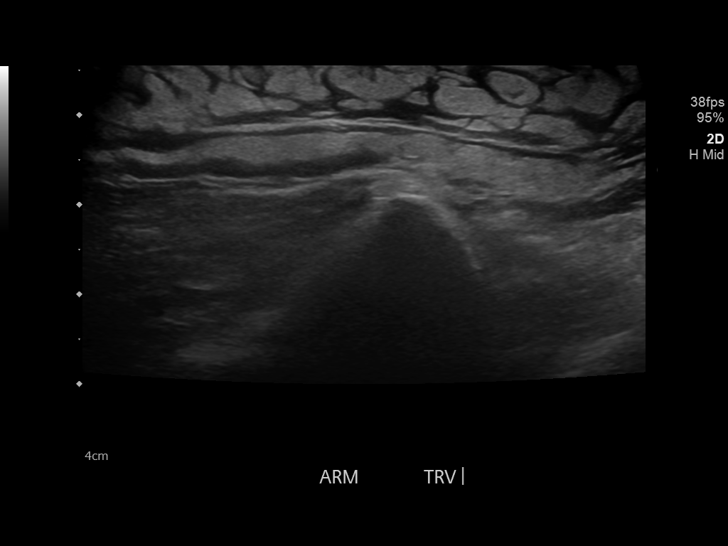
[im 18/48]
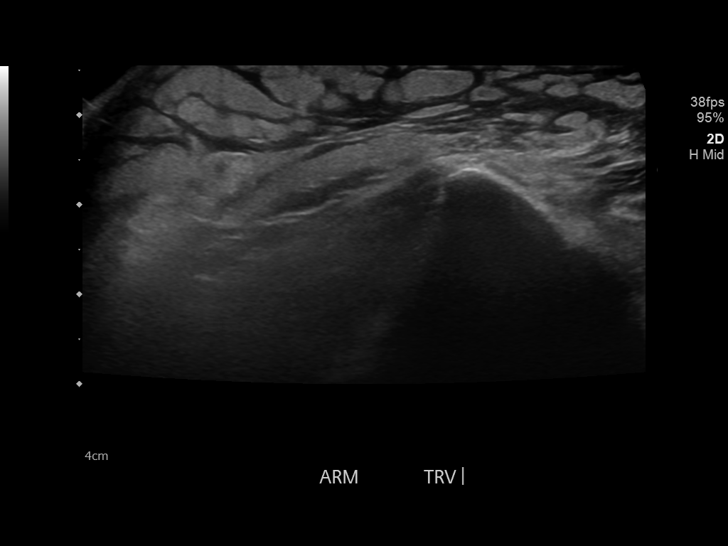
[im 22/48]
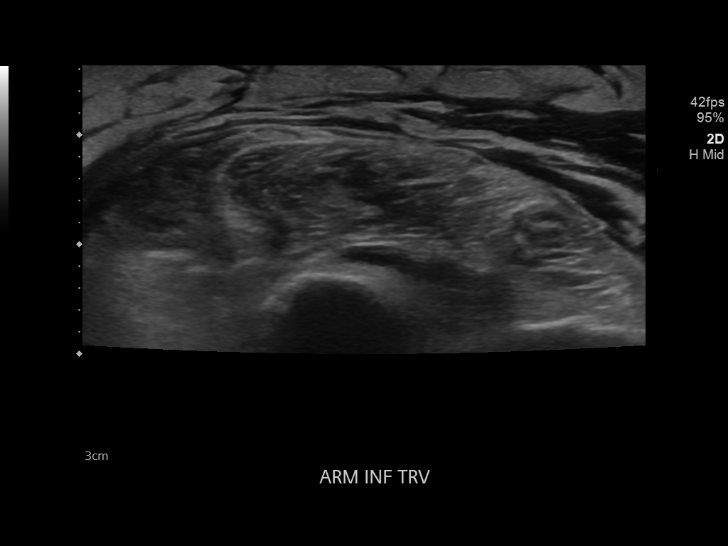
[im 26/48]
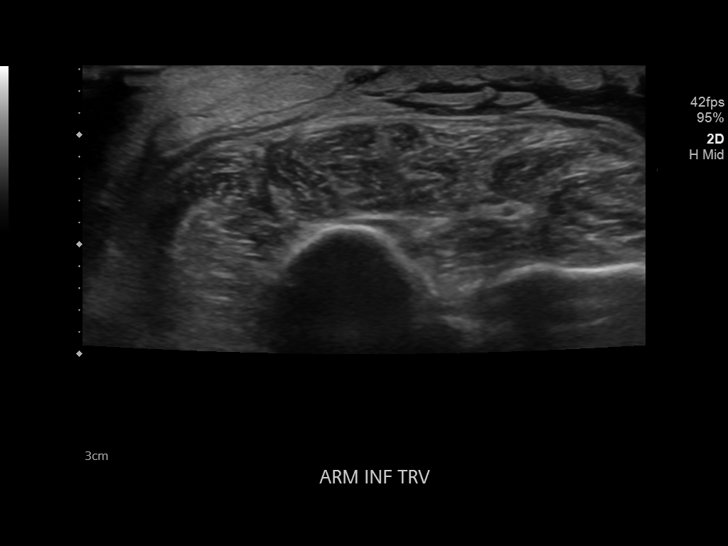
[im 30/48]
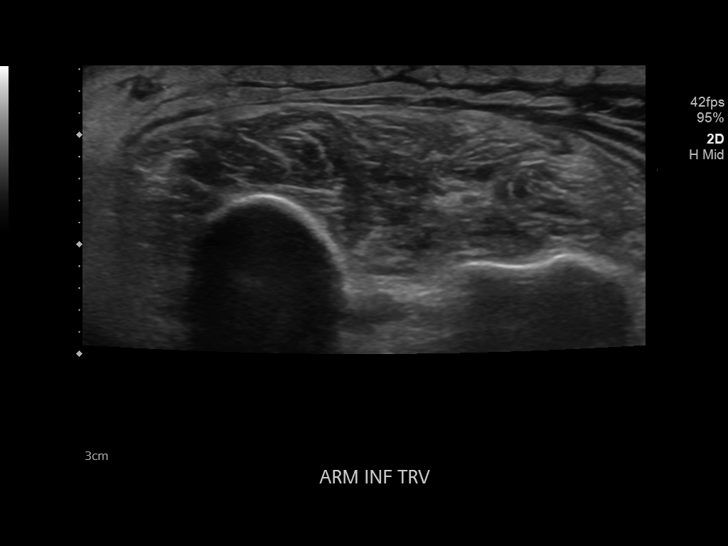
[im 32/48]
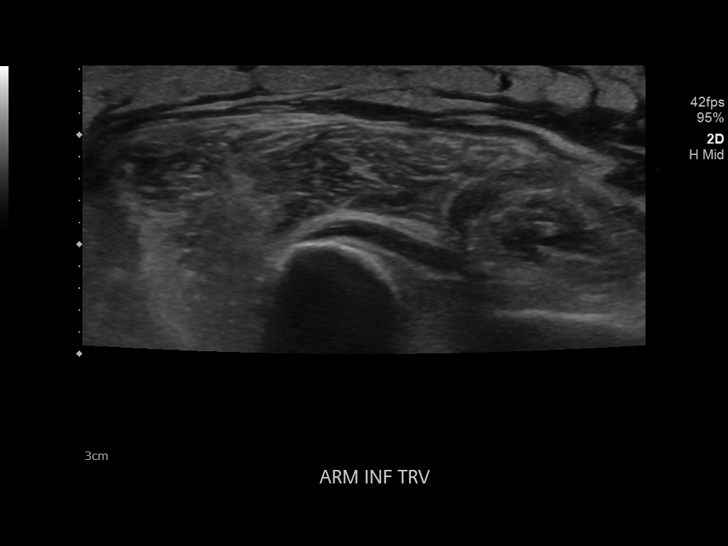
[im 36/48]
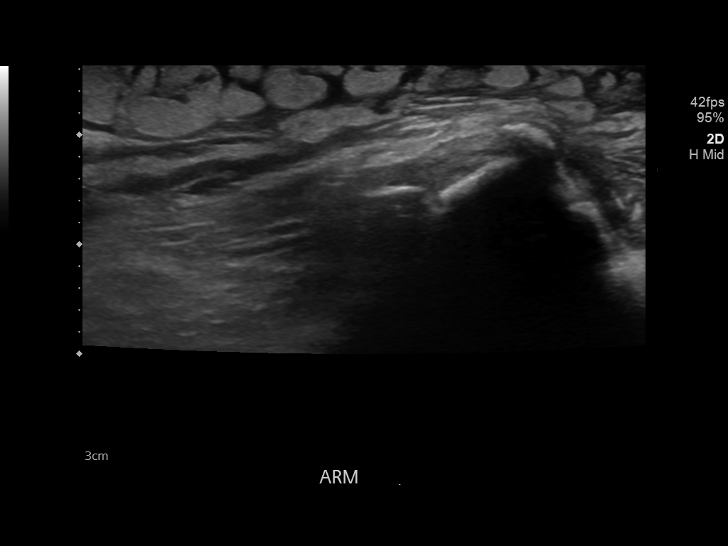
[im 40/48]
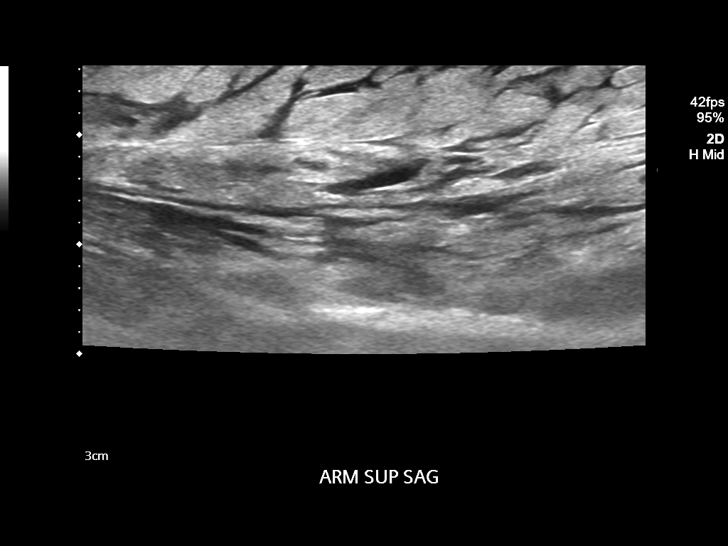
[im 44/48]
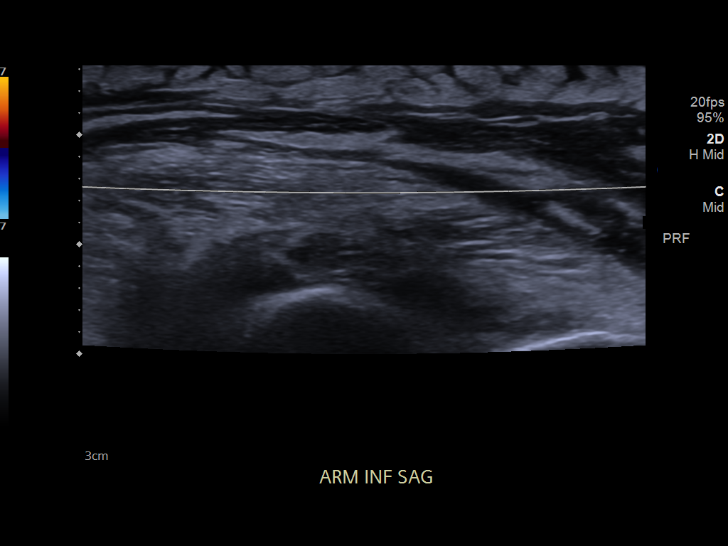
[im 48/48]
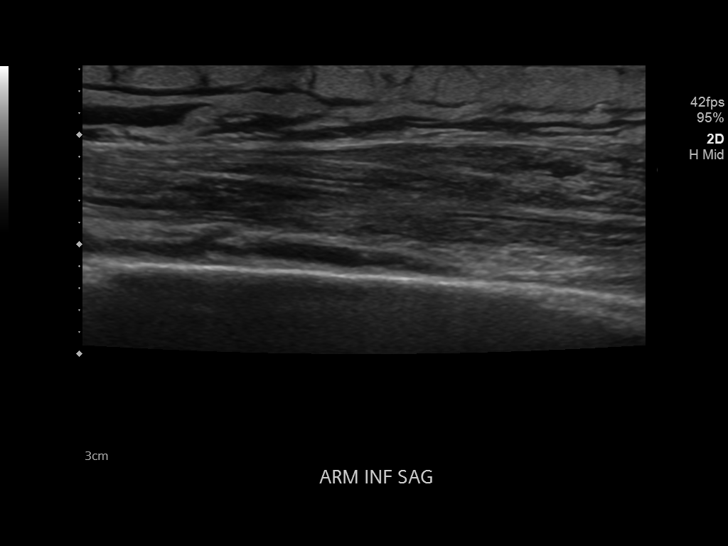

[14 of 25 positions shown; findings below may reference images not displayed]

FINDINGS: Severe generalized soft tissue edema in the left upper arm. No focal
fluid collection. No hematoma. No architectural distortion. No soft
tissue mass.
IMPRESSION: Nonspecific edema of the left upper arm.

## 2019-07-05 IMAGING — US US RENAL
1 series · 14 of 25 positions shown · non-contrast
Comparison: None.

CLINICAL DATA: Creatinine elevation.

EXAM:
RENAL / URINARY TRACT ULTRASOUND COMPLETE

[Series 1: us renal · 14 of 44 slices shown]
[im 1/44]
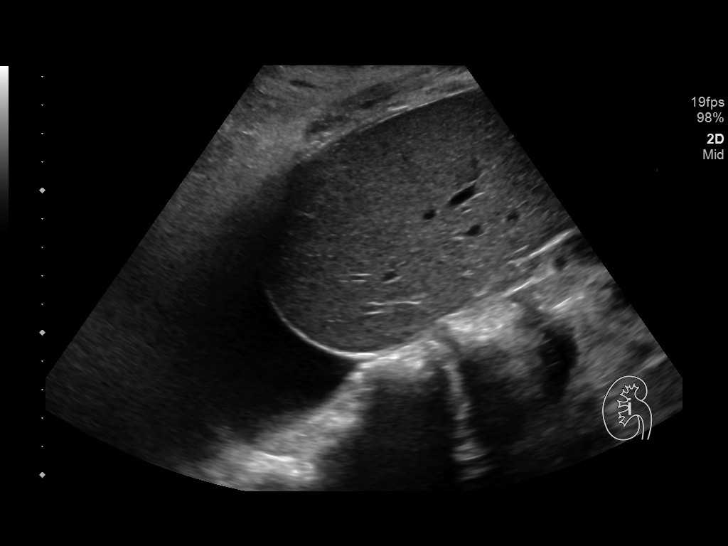
[im 4/44]
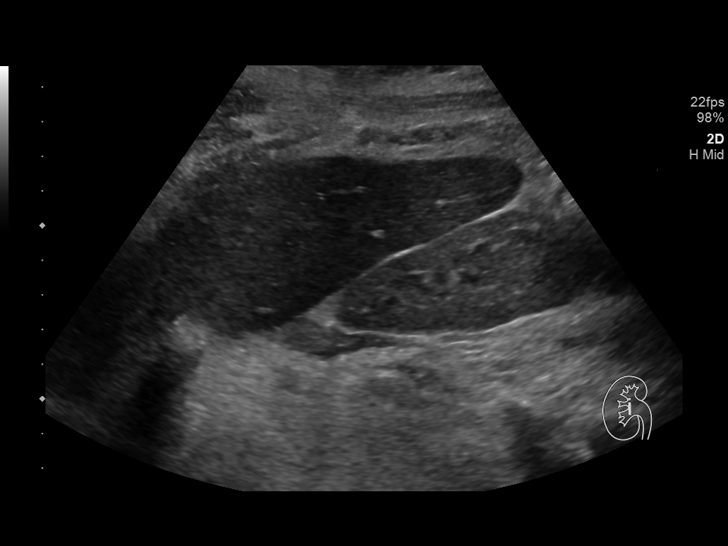
[im 8/44]
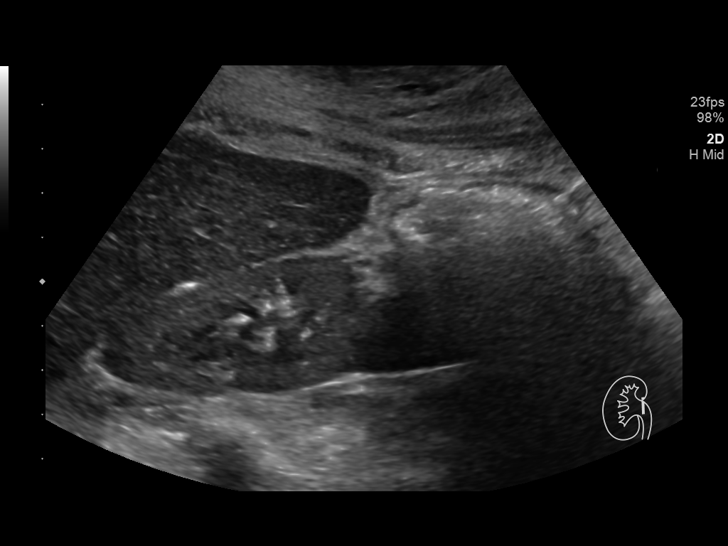
[im 11/44]
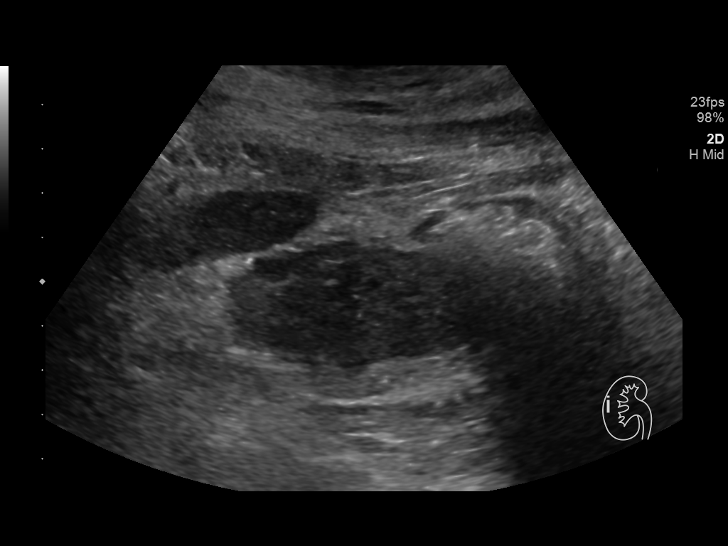
[im 15/44]
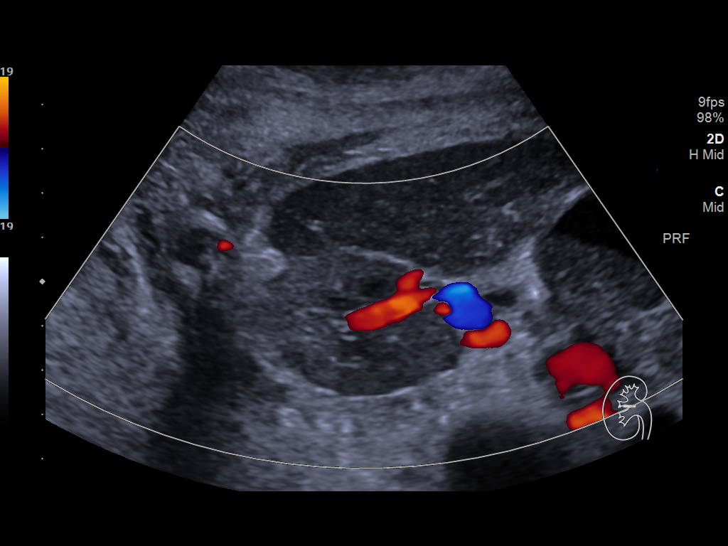
[im 17/44]
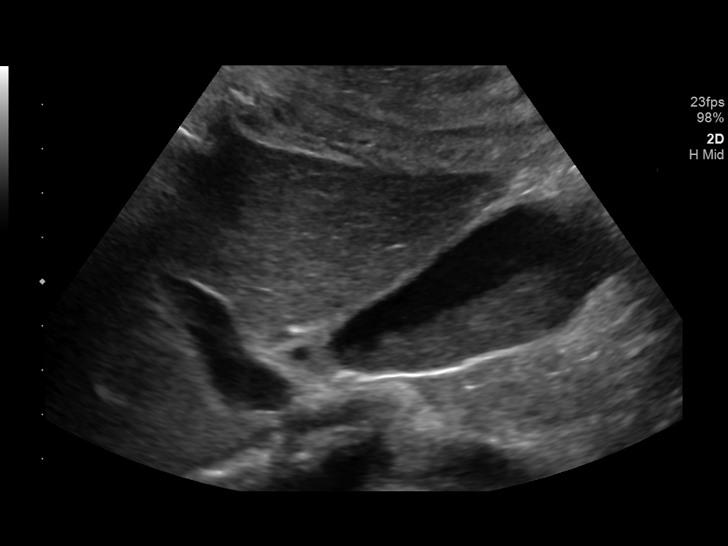
[im 20/44]
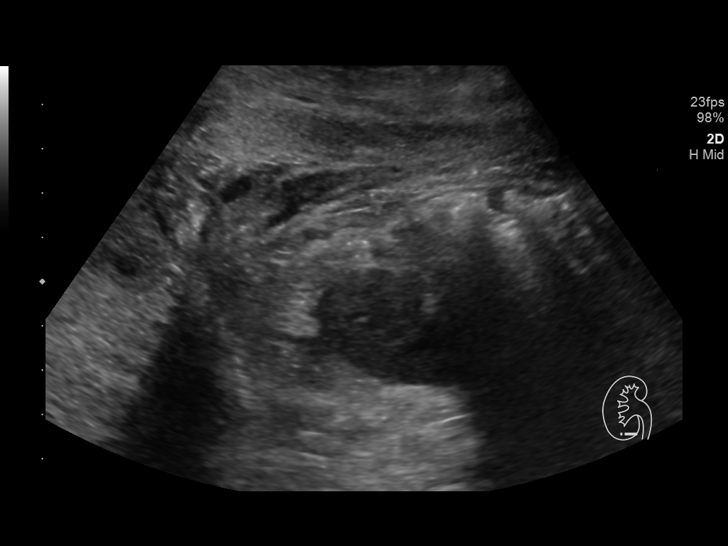
[im 24/44]
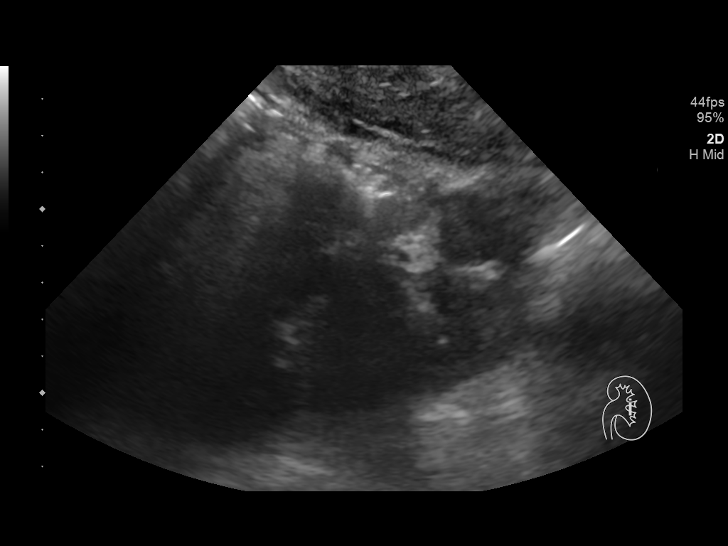
[im 27/44]
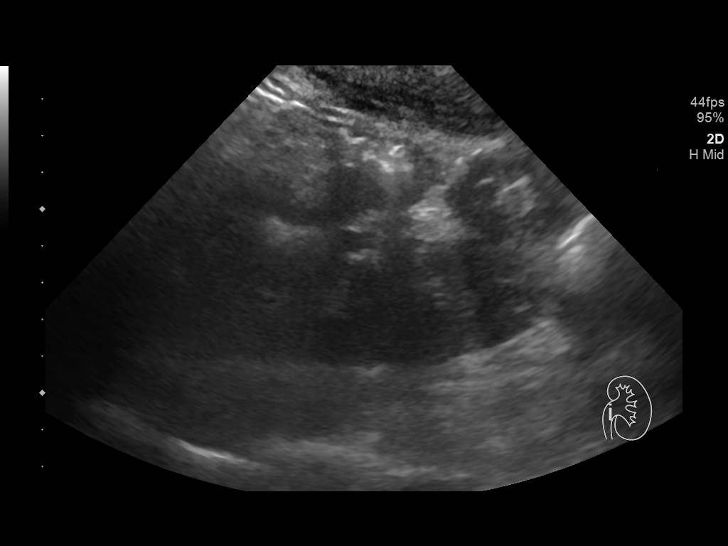
[im 29/44]
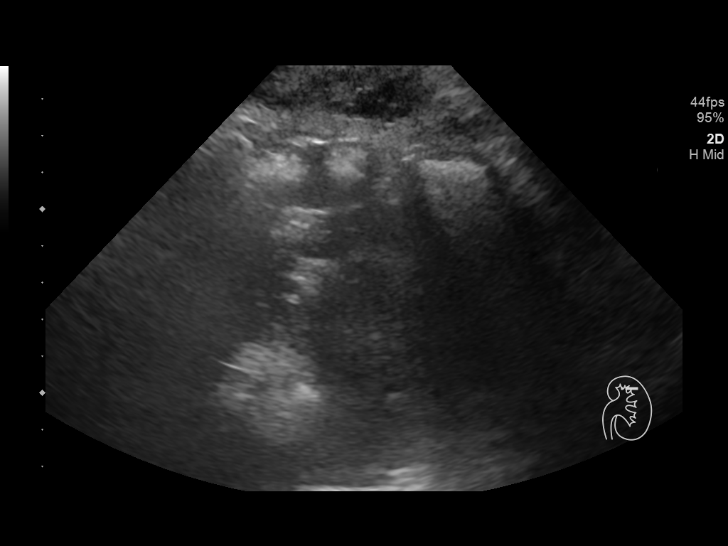
[im 33/44]
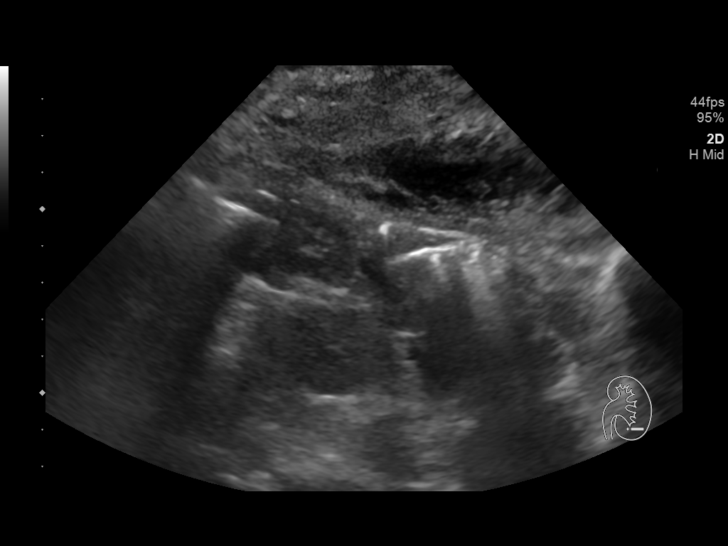
[im 36/44]
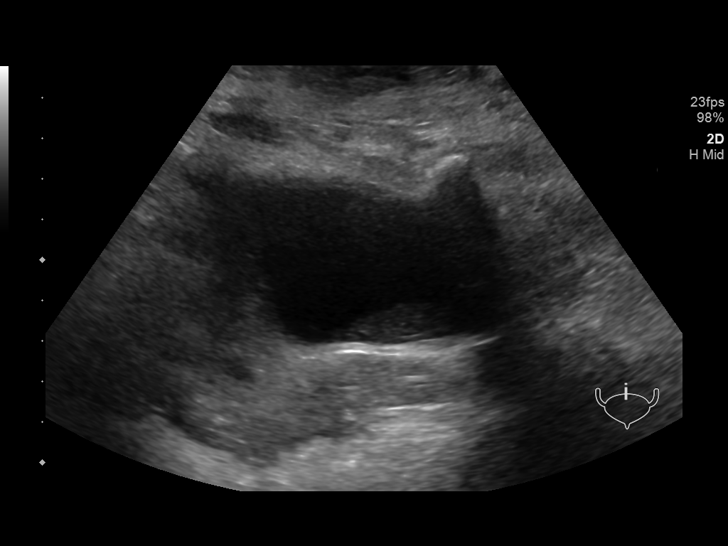
[im 40/44]
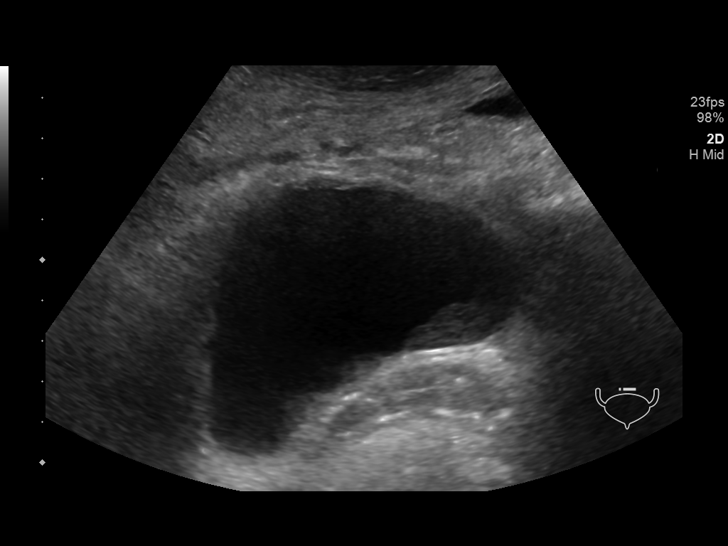
[im 44/44]
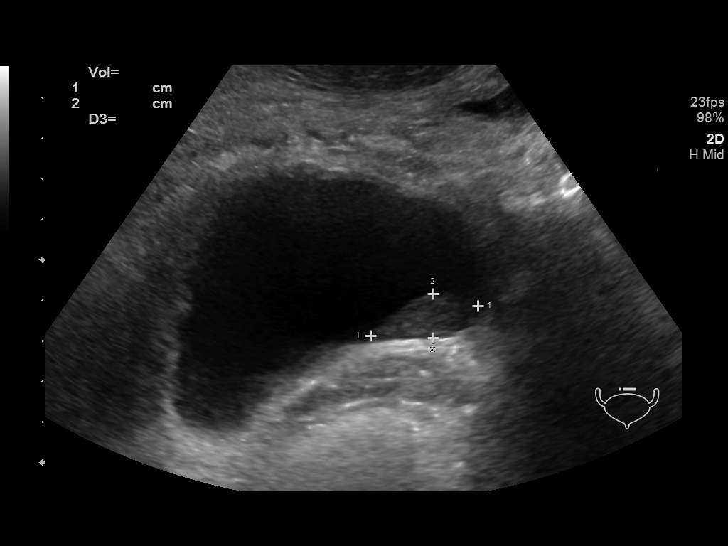

[14 of 25 positions shown; findings below may reference images not displayed]

FINDINGS: Right Kidney:

Renal measurements: 10.3 x 3.2 x 4.5 cm = volume: 79 mL .
Echogenicity within normal limits. No mass or hydronephrosis
visualized.

Left Kidney:

Renal measurements: 8.9 x 4.4 x 4.0 cm = volume: 83.8 mL.
Echogenicity within normal limits. No mass or hydronephrosis
visualized.

Bladder:

Focal soft tissue area is present along the base of the bladder
measuring 2.9 x 1.2 x 2.7 cm. Appears to be increased through
transmission with this area.

Bilateral pleural effusions are noted.
IMPRESSION: 1. Normal sonographic appearance of the kidneys bilaterally.
2. Soft tissue region at the base of the urinary bladder. This could
represent hemorrhage. Soft tissue mass is considered less likely.
Area was not apparent on recent CT scan. Given the artifact from
adjacent hardware, MRI of pelvis would be the next best option for
further evaluation. Cystoscopy could also be considered.
3. Bilateral pleural effusions are again noted.
